# Patient Record
Sex: Female | Born: 1940 | Race: White | Hispanic: No | Marital: Married | Smoking: Former smoker
Health system: Southern US, Community
[De-identification: ages and names within clinical notes are randomized; demographics above are authoritative.]

## PROBLEM LIST (undated history)

## (undated) DIAGNOSIS — Z8739 Personal history of other diseases of the musculoskeletal system and connective tissue: Secondary | ICD-10-CM

## (undated) DIAGNOSIS — M1990 Unspecified osteoarthritis, unspecified site: Secondary | ICD-10-CM

## (undated) DIAGNOSIS — C801 Malignant (primary) neoplasm, unspecified: Secondary | ICD-10-CM

## (undated) DIAGNOSIS — E785 Hyperlipidemia, unspecified: Secondary | ICD-10-CM

## (undated) DIAGNOSIS — C539 Malignant neoplasm of cervix uteri, unspecified: Secondary | ICD-10-CM

## (undated) DIAGNOSIS — Z789 Other specified health status: Secondary | ICD-10-CM

## (undated) DIAGNOSIS — Z853 Personal history of malignant neoplasm of breast: Secondary | ICD-10-CM

## (undated) DIAGNOSIS — I499 Cardiac arrhythmia, unspecified: Secondary | ICD-10-CM

## (undated) DIAGNOSIS — I4891 Unspecified atrial fibrillation: Secondary | ICD-10-CM

## (undated) DIAGNOSIS — R339 Retention of urine, unspecified: Secondary | ICD-10-CM

## (undated) DIAGNOSIS — K529 Noninfective gastroenteritis and colitis, unspecified: Secondary | ICD-10-CM

## (undated) HISTORY — PX: MASTECTOMY WITH AXILLARY LYMPH NODE DISSECTION: SHX5661

## (undated) HISTORY — PX: EYE SURGERY: SHX253

## (undated) HISTORY — PX: BREAST SURGERY: SHX581

## (undated) HISTORY — PX: CATARACT EXTRACTION W/ INTRAOCULAR LENS  IMPLANT, BILATERAL: SHX1307

## (undated) HISTORY — PX: TUBAL LIGATION: SHX77

## (undated) HISTORY — PX: KNEE ARTHROSCOPY: SHX127

## (undated) HISTORY — DX: Personal history of malignant neoplasm of breast: Z85.3

---

## 2004-07-23 DIAGNOSIS — Z853 Personal history of malignant neoplasm of breast: Secondary | ICD-10-CM

## 2004-07-23 HISTORY — PX: MASTECTOMY: SHX3

## 2004-07-23 HISTORY — DX: Personal history of malignant neoplasm of breast: Z85.3

## 2006-08-08 ENCOUNTER — Ambulatory Visit (HOSPITAL_COMMUNITY): Admission: RE | Admit: 2006-08-08 | Discharge: 2006-08-08 | Payer: Self-pay | Admitting: Hematology and Oncology

## 2007-08-11 ENCOUNTER — Ambulatory Visit (HOSPITAL_COMMUNITY): Admission: RE | Admit: 2007-08-11 | Discharge: 2007-08-11 | Payer: Self-pay | Admitting: Hematology and Oncology

## 2007-10-28 ENCOUNTER — Ambulatory Visit: Payer: Self-pay | Admitting: Cardiology

## 2011-08-29 DIAGNOSIS — Z1231 Encounter for screening mammogram for malignant neoplasm of breast: Secondary | ICD-10-CM | POA: Diagnosis not present

## 2011-08-29 DIAGNOSIS — M1710 Unilateral primary osteoarthritis, unspecified knee: Secondary | ICD-10-CM | POA: Diagnosis not present

## 2011-08-29 DIAGNOSIS — Z09 Encounter for follow-up examination after completed treatment for conditions other than malignant neoplasm: Secondary | ICD-10-CM | POA: Diagnosis not present

## 2011-08-29 DIAGNOSIS — Z79899 Other long term (current) drug therapy: Secondary | ICD-10-CM | POA: Diagnosis not present

## 2011-08-29 DIAGNOSIS — M810 Age-related osteoporosis without current pathological fracture: Secondary | ICD-10-CM | POA: Diagnosis not present

## 2011-08-29 DIAGNOSIS — Z9010 Acquired absence of unspecified breast and nipple: Secondary | ICD-10-CM | POA: Diagnosis not present

## 2011-08-29 DIAGNOSIS — Z853 Personal history of malignant neoplasm of breast: Secondary | ICD-10-CM | POA: Diagnosis not present

## 2011-09-03 DIAGNOSIS — M810 Age-related osteoporosis without current pathological fracture: Secondary | ICD-10-CM | POA: Diagnosis not present

## 2011-09-03 DIAGNOSIS — Z853 Personal history of malignant neoplasm of breast: Secondary | ICD-10-CM | POA: Diagnosis not present

## 2011-09-03 DIAGNOSIS — Z1231 Encounter for screening mammogram for malignant neoplasm of breast: Secondary | ICD-10-CM | POA: Diagnosis not present

## 2011-09-03 DIAGNOSIS — M1710 Unilateral primary osteoarthritis, unspecified knee: Secondary | ICD-10-CM | POA: Diagnosis not present

## 2011-09-03 DIAGNOSIS — Z09 Encounter for follow-up examination after completed treatment for conditions other than malignant neoplasm: Secondary | ICD-10-CM | POA: Diagnosis not present

## 2011-09-03 DIAGNOSIS — Z79899 Other long term (current) drug therapy: Secondary | ICD-10-CM | POA: Diagnosis not present

## 2011-09-12 DIAGNOSIS — M1710 Unilateral primary osteoarthritis, unspecified knee: Secondary | ICD-10-CM | POA: Diagnosis not present

## 2011-09-12 DIAGNOSIS — M810 Age-related osteoporosis without current pathological fracture: Secondary | ICD-10-CM | POA: Diagnosis not present

## 2011-09-12 DIAGNOSIS — Z79899 Other long term (current) drug therapy: Secondary | ICD-10-CM | POA: Diagnosis not present

## 2011-09-12 DIAGNOSIS — Z09 Encounter for follow-up examination after completed treatment for conditions other than malignant neoplasm: Secondary | ICD-10-CM | POA: Diagnosis not present

## 2011-09-12 DIAGNOSIS — Z853 Personal history of malignant neoplasm of breast: Secondary | ICD-10-CM | POA: Diagnosis not present

## 2011-09-12 DIAGNOSIS — Z1231 Encounter for screening mammogram for malignant neoplasm of breast: Secondary | ICD-10-CM | POA: Diagnosis not present

## 2011-09-18 DIAGNOSIS — Z853 Personal history of malignant neoplasm of breast: Secondary | ICD-10-CM | POA: Diagnosis not present

## 2011-09-18 DIAGNOSIS — Z9010 Acquired absence of unspecified breast and nipple: Secondary | ICD-10-CM | POA: Diagnosis not present

## 2011-09-18 DIAGNOSIS — Z1231 Encounter for screening mammogram for malignant neoplasm of breast: Secondary | ICD-10-CM | POA: Diagnosis not present

## 2011-09-18 DIAGNOSIS — Z09 Encounter for follow-up examination after completed treatment for conditions other than malignant neoplasm: Secondary | ICD-10-CM | POA: Diagnosis not present

## 2011-09-18 DIAGNOSIS — M810 Age-related osteoporosis without current pathological fracture: Secondary | ICD-10-CM | POA: Diagnosis not present

## 2011-09-18 DIAGNOSIS — Z79899 Other long term (current) drug therapy: Secondary | ICD-10-CM | POA: Diagnosis not present

## 2011-09-18 DIAGNOSIS — M1710 Unilateral primary osteoarthritis, unspecified knee: Secondary | ICD-10-CM | POA: Diagnosis not present

## 2011-10-23 DIAGNOSIS — Z853 Personal history of malignant neoplasm of breast: Secondary | ICD-10-CM | POA: Diagnosis not present

## 2011-10-23 DIAGNOSIS — Z803 Family history of malignant neoplasm of breast: Secondary | ICD-10-CM | POA: Diagnosis not present

## 2011-10-23 DIAGNOSIS — Z79899 Other long term (current) drug therapy: Secondary | ICD-10-CM | POA: Diagnosis not present

## 2011-10-23 DIAGNOSIS — Z09 Encounter for follow-up examination after completed treatment for conditions other than malignant neoplasm: Secondary | ICD-10-CM | POA: Diagnosis not present

## 2011-10-23 DIAGNOSIS — Z9010 Acquired absence of unspecified breast and nipple: Secondary | ICD-10-CM | POA: Diagnosis not present

## 2011-10-23 DIAGNOSIS — M1710 Unilateral primary osteoarthritis, unspecified knee: Secondary | ICD-10-CM | POA: Diagnosis not present

## 2011-10-23 DIAGNOSIS — M810 Age-related osteoporosis without current pathological fracture: Secondary | ICD-10-CM | POA: Diagnosis not present

## 2011-10-23 DIAGNOSIS — Z7983 Long term (current) use of bisphosphonates: Secondary | ICD-10-CM | POA: Diagnosis not present

## 2012-03-31 ENCOUNTER — Encounter: Payer: PRIVATE HEALTH INSURANCE | Admitting: Hematology and Oncology

## 2012-03-31 DIAGNOSIS — M899 Disorder of bone, unspecified: Secondary | ICD-10-CM | POA: Diagnosis not present

## 2012-03-31 DIAGNOSIS — C50919 Malignant neoplasm of unspecified site of unspecified female breast: Secondary | ICD-10-CM | POA: Diagnosis not present

## 2012-03-31 DIAGNOSIS — M949 Disorder of cartilage, unspecified: Secondary | ICD-10-CM | POA: Diagnosis not present

## 2012-03-31 DIAGNOSIS — M1710 Unilateral primary osteoarthritis, unspecified knee: Secondary | ICD-10-CM | POA: Diagnosis not present

## 2012-03-31 DIAGNOSIS — M810 Age-related osteoporosis without current pathological fracture: Secondary | ICD-10-CM | POA: Diagnosis not present

## 2012-04-23 DIAGNOSIS — M1710 Unilateral primary osteoarthritis, unspecified knee: Secondary | ICD-10-CM | POA: Diagnosis not present

## 2012-04-23 DIAGNOSIS — M949 Disorder of cartilage, unspecified: Secondary | ICD-10-CM | POA: Diagnosis not present

## 2012-04-23 DIAGNOSIS — C50919 Malignant neoplasm of unspecified site of unspecified female breast: Secondary | ICD-10-CM | POA: Diagnosis not present

## 2012-11-11 DIAGNOSIS — M76899 Other specified enthesopathies of unspecified lower limb, excluding foot: Secondary | ICD-10-CM | POA: Diagnosis not present

## 2012-11-11 DIAGNOSIS — I10 Essential (primary) hypertension: Secondary | ICD-10-CM | POA: Diagnosis not present

## 2012-11-11 DIAGNOSIS — M818 Other osteoporosis without current pathological fracture: Secondary | ICD-10-CM | POA: Diagnosis not present

## 2012-11-17 DIAGNOSIS — R922 Inconclusive mammogram: Secondary | ICD-10-CM | POA: Diagnosis not present

## 2012-11-17 DIAGNOSIS — Z1231 Encounter for screening mammogram for malignant neoplasm of breast: Secondary | ICD-10-CM | POA: Diagnosis not present

## 2012-11-17 DIAGNOSIS — M810 Age-related osteoporosis without current pathological fracture: Secondary | ICD-10-CM | POA: Diagnosis not present

## 2012-11-25 ENCOUNTER — Other Ambulatory Visit: Payer: Self-pay | Admitting: Internal Medicine

## 2012-11-25 DIAGNOSIS — N6310 Unspecified lump in the right breast, unspecified quadrant: Secondary | ICD-10-CM

## 2012-11-25 DIAGNOSIS — R928 Other abnormal and inconclusive findings on diagnostic imaging of breast: Secondary | ICD-10-CM | POA: Diagnosis not present

## 2012-11-25 DIAGNOSIS — N63 Unspecified lump in unspecified breast: Secondary | ICD-10-CM | POA: Diagnosis not present

## 2012-11-27 ENCOUNTER — Ambulatory Visit
Admission: RE | Admit: 2012-11-27 | Discharge: 2012-11-27 | Disposition: A | Discharge status: Home / Self Care | Payer: PRIVATE HEALTH INSURANCE | Source: Ambulatory Visit | Attending: Internal Medicine | Admitting: Internal Medicine

## 2012-11-27 DIAGNOSIS — N63 Unspecified lump in unspecified breast: Secondary | ICD-10-CM | POA: Diagnosis not present

## 2012-11-27 DIAGNOSIS — N6310 Unspecified lump in the right breast, unspecified quadrant: Secondary | ICD-10-CM

## 2012-11-27 DIAGNOSIS — N6019 Diffuse cystic mastopathy of unspecified breast: Secondary | ICD-10-CM | POA: Diagnosis not present

## 2013-02-10 DIAGNOSIS — M76899 Other specified enthesopathies of unspecified lower limb, excluding foot: Secondary | ICD-10-CM | POA: Diagnosis not present

## 2013-05-14 DIAGNOSIS — J309 Allergic rhinitis, unspecified: Secondary | ICD-10-CM | POA: Diagnosis not present

## 2013-05-14 DIAGNOSIS — IMO0002 Reserved for concepts with insufficient information to code with codable children: Secondary | ICD-10-CM | POA: Diagnosis not present

## 2013-06-29 DIAGNOSIS — M1710 Unilateral primary osteoarthritis, unspecified knee: Secondary | ICD-10-CM | POA: Diagnosis not present

## 2013-07-06 DIAGNOSIS — M1710 Unilateral primary osteoarthritis, unspecified knee: Secondary | ICD-10-CM | POA: Diagnosis not present

## 2013-07-13 DIAGNOSIS — M1710 Unilateral primary osteoarthritis, unspecified knee: Secondary | ICD-10-CM | POA: Diagnosis not present

## 2013-08-20 DIAGNOSIS — M25519 Pain in unspecified shoulder: Secondary | ICD-10-CM | POA: Diagnosis not present

## 2013-08-20 DIAGNOSIS — M19019 Primary osteoarthritis, unspecified shoulder: Secondary | ICD-10-CM | POA: Diagnosis not present

## 2013-08-20 DIAGNOSIS — IMO0002 Reserved for concepts with insufficient information to code with codable children: Secondary | ICD-10-CM | POA: Diagnosis not present

## 2013-11-10 DIAGNOSIS — S86819A Strain of other muscle(s) and tendon(s) at lower leg level, unspecified leg, initial encounter: Secondary | ICD-10-CM | POA: Diagnosis not present

## 2013-11-10 DIAGNOSIS — IMO0002 Reserved for concepts with insufficient information to code with codable children: Secondary | ICD-10-CM | POA: Diagnosis not present

## 2013-11-10 DIAGNOSIS — S838X9A Sprain of other specified parts of unspecified knee, initial encounter: Secondary | ICD-10-CM | POA: Diagnosis not present

## 2013-11-10 DIAGNOSIS — I10 Essential (primary) hypertension: Secondary | ICD-10-CM | POA: Diagnosis not present

## 2013-11-13 DIAGNOSIS — IMO0002 Reserved for concepts with insufficient information to code with codable children: Secondary | ICD-10-CM | POA: Diagnosis not present

## 2013-11-13 DIAGNOSIS — M1710 Unilateral primary osteoarthritis, unspecified knee: Secondary | ICD-10-CM | POA: Diagnosis not present

## 2014-01-13 ENCOUNTER — Other Ambulatory Visit: Payer: Self-pay | Admitting: Internal Medicine

## 2014-01-13 DIAGNOSIS — Z9012 Acquired absence of left breast and nipple: Secondary | ICD-10-CM

## 2014-01-13 DIAGNOSIS — Z853 Personal history of malignant neoplasm of breast: Secondary | ICD-10-CM

## 2014-01-13 DIAGNOSIS — R928 Other abnormal and inconclusive findings on diagnostic imaging of breast: Secondary | ICD-10-CM | POA: Diagnosis not present

## 2014-01-21 ENCOUNTER — Ambulatory Visit
Admission: RE | Admit: 2014-01-21 | Discharge: 2014-01-21 | Disposition: A | Discharge status: Home / Self Care | Payer: Medicare Other | Source: Ambulatory Visit | Attending: Internal Medicine | Admitting: Internal Medicine

## 2014-01-21 ENCOUNTER — Ambulatory Visit
Admission: RE | Admit: 2014-01-21 | Discharge: 2014-01-21 | Disposition: A | Discharge status: Home / Self Care | Payer: Medicare Other | Source: Ambulatory Visit

## 2014-01-21 ENCOUNTER — Encounter (INDEPENDENT_AMBULATORY_CARE_PROVIDER_SITE_OTHER): Payer: Self-pay

## 2014-01-21 DIAGNOSIS — Z1231 Encounter for screening mammogram for malignant neoplasm of breast: Secondary | ICD-10-CM

## 2014-01-21 DIAGNOSIS — Z853 Personal history of malignant neoplasm of breast: Secondary | ICD-10-CM

## 2014-01-21 DIAGNOSIS — Z9012 Acquired absence of left breast and nipple: Secondary | ICD-10-CM

## 2014-01-27 ENCOUNTER — Other Ambulatory Visit: Payer: Self-pay | Admitting: Internal Medicine

## 2014-01-27 DIAGNOSIS — Z853 Personal history of malignant neoplasm of breast: Secondary | ICD-10-CM

## 2014-01-27 DIAGNOSIS — Z9012 Acquired absence of left breast and nipple: Secondary | ICD-10-CM

## 2014-01-27 DIAGNOSIS — Z1231 Encounter for screening mammogram for malignant neoplasm of breast: Secondary | ICD-10-CM

## 2014-01-28 ENCOUNTER — Other Ambulatory Visit: Payer: Self-pay | Admitting: Internal Medicine

## 2014-01-28 DIAGNOSIS — R928 Other abnormal and inconclusive findings on diagnostic imaging of breast: Secondary | ICD-10-CM

## 2014-02-05 ENCOUNTER — Ambulatory Visit
Admission: RE | Admit: 2014-02-05 | Discharge: 2014-02-05 | Disposition: A | Discharge status: Home / Self Care | Payer: Medicare Other | Source: Ambulatory Visit | Attending: Internal Medicine | Admitting: Internal Medicine

## 2014-02-05 DIAGNOSIS — R928 Other abnormal and inconclusive findings on diagnostic imaging of breast: Secondary | ICD-10-CM

## 2014-02-05 DIAGNOSIS — N6009 Solitary cyst of unspecified breast: Secondary | ICD-10-CM | POA: Diagnosis not present

## 2014-02-12 DIAGNOSIS — M20099 Other deformity of finger(s), unspecified finger(s): Secondary | ICD-10-CM | POA: Diagnosis not present

## 2014-02-12 DIAGNOSIS — Z0000 Encounter for general adult medical examination without abnormal findings: Secondary | ICD-10-CM | POA: Diagnosis not present

## 2014-04-03 ENCOUNTER — Encounter (HOSPITAL_COMMUNITY): Payer: Self-pay | Admitting: Emergency Medicine

## 2014-04-03 ENCOUNTER — Emergency Department (HOSPITAL_COMMUNITY)
Admission: EM | Admit: 2014-04-03 | Discharge: 2014-04-03 | Disposition: A | Discharge status: Home / Self Care | Payer: Medicare Other | Attending: Emergency Medicine | Admitting: Emergency Medicine

## 2014-04-03 DIAGNOSIS — T148XXA Other injury of unspecified body region, initial encounter: Secondary | ICD-10-CM

## 2014-04-03 DIAGNOSIS — Y9389 Activity, other specified: Secondary | ICD-10-CM | POA: Insufficient documentation

## 2014-04-03 DIAGNOSIS — W100XXA Fall (on)(from) escalator, initial encounter: Secondary | ICD-10-CM | POA: Diagnosis not present

## 2014-04-03 DIAGNOSIS — Z8739 Personal history of other diseases of the musculoskeletal system and connective tissue: Secondary | ICD-10-CM | POA: Diagnosis not present

## 2014-04-03 DIAGNOSIS — Z23 Encounter for immunization: Secondary | ICD-10-CM | POA: Insufficient documentation

## 2014-04-03 DIAGNOSIS — IMO0002 Reserved for concepts with insufficient information to code with codable children: Secondary | ICD-10-CM | POA: Diagnosis not present

## 2014-04-03 DIAGNOSIS — Y9229 Other specified public building as the place of occurrence of the external cause: Secondary | ICD-10-CM | POA: Insufficient documentation

## 2014-04-03 HISTORY — DX: Unspecified osteoarthritis, unspecified site: M19.90

## 2014-04-03 MED ORDER — SULFAMETHOXAZOLE-TRIMETHOPRIM 800-160 MG PO TABS: 1.0000 | ORAL_TABLET | Freq: Two times a day (BID) | ORAL | Status: DC

## 2014-04-03 MED ORDER — ONDANSETRON 4 MG PO TBDP
8.0000 mg | ORAL_TABLET | Freq: Once | ORAL | Status: AC
  Administered 2014-04-03: 8 mg via ORAL
  Filled 2014-04-03: qty 2

## 2014-04-03 MED ORDER — CEPHALEXIN 500 MG PO CAPS: ORAL_CAPSULE | ORAL | Status: DC

## 2014-04-03 MED ORDER — HYDROCODONE-ACETAMINOPHEN 5-325 MG PO TABS
2.0000 | ORAL_TABLET | Freq: Once | ORAL | Status: AC
  Administered 2014-04-03: 2 via ORAL
  Filled 2014-04-03: qty 2

## 2014-04-03 MED ORDER — TETANUS-DIPHTH-ACELL PERTUSSIS 5-2.5-18.5 LF-MCG/0.5 IM SUSP
0.5000 mL | Freq: Once | INTRAMUSCULAR | Status: AC
  Administered 2014-04-03: 0.5 mL via INTRAMUSCULAR
  Filled 2014-04-03: qty 0.5

## 2014-04-03 MED ORDER — HYDROCODONE-ACETAMINOPHEN 5-325 MG PO TABS: 1.0000 | ORAL_TABLET | Freq: Four times a day (QID) | ORAL | Status: DC | PRN

## 2014-04-03 NOTE — ED Notes (Signed)
[place] mall and fell.  Pt has bil lower leg abrasions

## 2014-04-03 NOTE — ED Provider Notes (Signed)
CSN: [id number]     Arrival date & time 04/03/14  1434 History  This chart was scribed for non-physician practitioner, Cleatrice Burke, PA-C working with Debby Freiberg, MD, by Erling Conte, ED Scribe. This patient was seen in room TR09C/TR09C and the patient's care was started at 4:49 PM.    Chief Complaint  Patient presents with  . Abrasion  . Extremity Laceration     The history is provided by the patient. No language interpreter was used.   HPI Comments: Pam [name] is a 73 y.o. female who presents to the Emergency Department complaining of a laceration to both lower legs. She is having associated "burning", "9/10", lower leg pain. Pt states she was at the mall and missed a step and her leg got cut on the escalator. There is some active bleeding but the bleeding is currently being controlled by wrapped gauze bandages. She states she is due to have a t-dap. She denies any other injuries from the fall. She denies any h/o DM. She is not currently taking any anticoagulants. She denies any LOC, weakness, numbness or swelling of the lower legs, or HA.   Past Medical History  Diagnosis Date  . Arthritis    No past surgical history on file. No family history on file. History  Substance Use Topics  . Smoking status: Never Smoker   . Smokeless tobacco: Not on file  . Alcohol Use: No   OB History   Grav Para Term Preterm Abortions TAB SAB Ect Mult Living                 Review of Systems  Musculoskeletal: Negative for arthralgias, joint swelling and myalgias.  Skin: Positive for wound.  Neurological: Negative for syncope, weakness, numbness and headaches.  Hematological: Does not bruise/bleed easily.      Allergies  Review of patient's allergies indicates no known allergies.  Home Medications   Prior to Admission medications   Not on File   Triage Vitals: BP 131/96  Pulse 106  Temp(Src) 97.8 F (36.6 C) (Oral)  Resp 20  Ht 5\' 6"  (1.676 m)  Wt 123 lb (55.792 kg)   BMI 19.86 kg/m2  SpO2 96%  Physical Exam  Nursing note and vitals reviewed. Constitutional: She is oriented to person, place, and time. She appears well-developed and well-nourished. No distress.  HENT:  Head: Normocephalic and atraumatic.  Right Ear: External ear normal.  Left Ear: External ear normal.  Nose: Nose normal.  Mouth/Throat: Oropharynx is clear and moist.  Eyes: Conjunctivae are normal.  Neck: Normal range of motion.  Cardiovascular: Normal rate, regular rhythm and normal heart sounds.   Pulmonary/Chest: Effort normal and breath sounds normal. No stridor. No respiratory distress. She has no wheezes. She has no rales.  Abdominal: Soft. She exhibits no distension.  Musculoskeletal: Normal range of motion.  Neurological: She is alert and oriented to person, place, and time. She has normal strength.  Skin: Skin is warm and dry. She is not diaphoretic. No erythema.  Abrasions to posterior aspect of calves bilaterally. Strength 5/5 in lower extremities bilaterally. Sensation intact  Psychiatric: She has a normal mood and affect. Her behavior is normal.    ED Course  Procedures (including critical care time)  DIAGNOSTIC STUDIES: Oxygen Saturation is 96% on RA, adequate by my interpretation.    COORDINATION OF CARE: 4:55 PM- Will order Zofran, Norco and t-dap vaccination. Pt on my examination pt does need a laceration repair. Pt advised of plan for  treatment and pt agrees    Labs Review Labs Reviewed - No data to display  Imaging Review No results found.   EKG Interpretation None      MDM   Final diagnoses:  Abrasion   Patient presents to ED for evaluation of bilateral abrasions to posterior calves. Abrasions or significant, but superficial. No sutures required at this. Strength 5/5 with dorsi/plantar flexion. Sensation intact. Patient's TDAP was updated and wound was cleaned. Patient very concerned about infection. She was given antibiotics, but told to only  use them at signs of infection. She will f/u with PCP. Discussed reasons to return to ED immediately. Vital signs stable for discharge. Dr. Colin Rhein evaluated patient and agrees with plan. Patient / Family / Caregiver informed of clinical course, understand medical decision-making process, and agree with plan.   I personally performed the services described in this documentation, which was scribed in my presence. The recorded information has been reviewed and is accurate.    Elwyn Lade, PA-C 04/04/14 2357

## 2014-04-03 NOTE — Discharge Instructions (Signed)

## 2014-04-03 NOTE — ED Notes (Signed)
Pt. Stated, I was at mall and missed a step and my leg got all scrape and cut on the escalator steps.  Both lower legs.

## 2014-04-13 NOTE — ED Provider Notes (Signed)
Medical screening examination/treatment/procedure(s) were performed by non-physician practitioner and as supervising physician I was immediately available for consultation/collaboration.   EKG Interpretation None        Debby Freiberg, MD 04/13/14 [phone]

## 2014-05-18 DIAGNOSIS — M179 Osteoarthritis of knee, unspecified: Secondary | ICD-10-CM | POA: Diagnosis not present

## 2014-08-20 DIAGNOSIS — M179 Osteoarthritis of knee, unspecified: Secondary | ICD-10-CM | POA: Diagnosis not present

## 2014-08-20 DIAGNOSIS — M150 Primary generalized (osteo)arthritis: Secondary | ICD-10-CM | POA: Diagnosis not present

## 2014-08-20 DIAGNOSIS — J309 Allergic rhinitis, unspecified: Secondary | ICD-10-CM | POA: Diagnosis not present

## 2014-11-05 DIAGNOSIS — M179 Osteoarthritis of knee, unspecified: Secondary | ICD-10-CM | POA: Diagnosis not present

## 2014-11-05 DIAGNOSIS — M150 Primary generalized (osteo)arthritis: Secondary | ICD-10-CM | POA: Diagnosis not present

## 2014-11-05 DIAGNOSIS — H8113 Benign paroxysmal vertigo, bilateral: Secondary | ICD-10-CM | POA: Diagnosis not present

## 2015-01-06 ENCOUNTER — Other Ambulatory Visit: Payer: Self-pay

## 2015-01-06 DIAGNOSIS — Z1231 Encounter for screening mammogram for malignant neoplasm of breast: Secondary | ICD-10-CM

## 2015-01-06 DIAGNOSIS — Z9012 Acquired absence of left breast and nipple: Secondary | ICD-10-CM

## 2015-02-04 DIAGNOSIS — Z1231 Encounter for screening mammogram for malignant neoplasm of breast: Secondary | ICD-10-CM | POA: Diagnosis not present

## 2015-02-11 DIAGNOSIS — M179 Osteoarthritis of knee, unspecified: Secondary | ICD-10-CM | POA: Diagnosis not present

## 2015-03-14 DIAGNOSIS — C541 Malignant neoplasm of endometrium: Secondary | ICD-10-CM | POA: Diagnosis not present

## 2015-03-14 DIAGNOSIS — N950 Postmenopausal bleeding: Secondary | ICD-10-CM | POA: Diagnosis not present

## 2015-03-14 DIAGNOSIS — R87613 High grade squamous intraepithelial lesion on cytologic smear of cervix (HGSIL): Secondary | ICD-10-CM | POA: Diagnosis not present

## 2015-03-14 DIAGNOSIS — R1032 Left lower quadrant pain: Secondary | ICD-10-CM | POA: Diagnosis not present

## 2015-03-14 DIAGNOSIS — R319 Hematuria, unspecified: Secondary | ICD-10-CM | POA: Diagnosis not present

## 2015-03-23 ENCOUNTER — Encounter: Payer: Self-pay | Admitting: Gynecologic Oncology

## 2015-03-23 ENCOUNTER — Ambulatory Visit: Payer: Medicare Other | Attending: Gynecologic Oncology | Admitting: Gynecologic Oncology

## 2015-03-23 VITALS — BP 157/61 | HR 81 | Temp 97.7°F | Resp 16 | Ht 63.5 in | Wt 125.9 lb

## 2015-03-23 DIAGNOSIS — C539 Malignant neoplasm of cervix uteri, unspecified: Secondary | ICD-10-CM | POA: Insufficient documentation

## 2015-03-23 DIAGNOSIS — IMO0002 Reserved for concepts with insufficient information to code with codable children: Secondary | ICD-10-CM

## 2015-03-23 NOTE — Progress Notes (Signed)
Consult Note: Gyn-Onc  Pam [name] 74 y.o. female  CC:  Chief Complaint  Patient presents with  . Squamous Cell Carcinoma    New consult    HPI: Patient is seen today in consultation at the request of Dr. Benjie Karvonen for newly diagnosed cervical cancer.  Patient is a very pleasant 74 year old gravida 1 para 1 who became menopausal in her late 70s. She was never on any hormone replacement therapy. Her last GYN exam was approximately 11 years ago. She had an episode of vaginal spotting about 3 weeks ago. It was associated with One-A-Day pain the day prior. She went to see Dr. Benjie Karvonen who is the same physician her daughter sees. She had a Pap smear performed at that time. 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 Her Pap smear revealed high-grade squamous intraepithelial lesion with severe dysplasia cannot exclude microinvasive carcinoma. She underwent an ultrasound revealed the endometrial cavity to have some fluid within it. The endometrial walls were noted to be 5.4 mm. There is 1.7 x 0.7 x 1.8 cm area that the appearance of a polyp that there is no vascularity seen. There was a 2.4 x 2.1 x 2.3 cm lower uterine segment fibroid. They could not identify any abnormalities in the adnexa. Because of the endometrial biopsy results and the appearance of the cervix there was a concern for a squamous cell carcinoma the cervix and she was subsequently referred to [place].  The patient is married though she is not sexually active. As stated her last Pap smear was in 2006 she's never had an abnormal Pap smear. She has lost about 70 pounds in the last 3-4 years. She works every day. Other than some arthritis in her left knee she overall feels very well. She is up-to-date on her mammograms having had one last in July 2016. She had a breast biopsy done in May 2014 that was negative. She has a  personal history of breast cancer treated in 2006 with surgical excision followed by Adriamycin and Cytoxan based chemotherapy. She is up-to-date on her colonoscopy.  Review of Systems  Constitutional: Denies fever. Skin: No rash Cardiovascular: No chest pain, shortness of breath, or edema  Pulmonary: No cough  Gastro Intestinal: Reporting intermittent lower abdominal soreness.  No nausea, vomiting, constipation, or diarrhea reported. No change in bowel movement.  Genitourinary: Denies vaginal bleeding and discharge other than above.  Musculoskeletal: + left knee arthritis pain Neurologic: No weakness, numbness, or change in gait.  Psychology: "not worried:   Current Meds:  Outpatient Encounter Prescriptions as of 03/23/2015  Medication Sig  . Calcium Carbonate (CALCIUM 600 PO) Take 1 tablet by mouth daily.  . Cholecalciferol (VITAMIN D PO) Take 1 capsule by mouth daily.  . diclofenac (VOLTAREN) 75 MG EC tablet Take 75 mg by mouth daily.  . fexofenadine (ALLEGRA) 180 MG tablet Take 180 mg by mouth daily.  . [DISCONTINUED] cephALEXin (KEFLEX) 500 MG capsule 2 caps po bid x 7 days  . [DISCONTINUED] HYDROcodone-acetaminophen (NORCO/VICODIN) 5-325 MG per tablet Take 1 tablet by mouth every 6 (six) hours as needed for severe pain.  . [DISCONTINUED] sulfamethoxazole-trimethoprim (SEPTRA DS) 800-160 MG per tablet Take 1 tablet by mouth every 12 (twelve) hours.   No facility-administered encounter medications on file as of 03/23/2015.    Allergy:  Allergies  Allergen Reactions  . Aspirin Nausea And Vomiting  . Ciprofloxacin   .  Flagyl [Metronidazole]   . Penicillins Hives    Social Hx:   Social History   Social History  . Marital Status: Married    Spouse Name: N/A  . Number of Children: N/A  . Years of Education: N/A   Occupational History  . Not on file.   Social History Main Topics  . Smoking status: Former Smoker -- 30 years    Types: Cigarettes    Quit date: 07/23/1994   . Smokeless tobacco: Not on file  . Alcohol Use: No  . Drug Use: No  . Sexual Activity: Not on file   Other Topics Concern  . Not on file   Social History Narrative    Past Surgical Hx:  Past Surgical History  Procedure Laterality Date  . Mastectomy Left 2006  . Knee arthroscopy Left     Past Medical Hx:  Past Medical History  Diagnosis Date  . Arthritis   . History of breast cancer 2006    Dr. Sonny Dandy oncologist     Oncology Hx:   No history exists.    Family Hx:  Family History  Problem Relation Age of Onset  . Prostate cancer Father   . Heart disease Father   . Breast cancer Sister   . Heart disease Mother     Vitals:  Blood pressure 157/61, pulse 81, temperature 97.7 F (36.5 C), temperature source Oral, resp. rate 16, height 5' 3.5" (1.613 m), weight 125 lb 14.4 oz (57.108 kg).  Physical Exam: Well-nourished well-developed female in no acute distress  Neck: Supple, no lymphadenopathy, no thyromegaly.  Lungs: Clear to auscultation bilaterally.  Cardiac: Regular rate and rhythm.  Abdomen: Soft, nontender, nondistended. No palpable masses or hepatosplenomegaly. No rebound or guarding.  Groins: No lymphadenopathy.  Extremity: No edema.  Pelvic: Normal female genitalia. Vagina is markedly atrophic. The cervix is irregular in appearance with an ulcerated area at the cervical os measured approximately 1 cm. Bimanual exam reveals the cervix to be firm and slightly enlarged. The uterus is of normal size shape and consistency. There are no adnexal masses. Rectovaginal examination reveals no parametrial involvement. The cervix and uterus are freely mobile. Total size of the cervix is 3-4 cm.  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 While cervical biopsy was not performed, the endometrial biopsy revealed fragments of squamous cell carcinoma and clinically this is consistent with stage 1B disease. I spoke with  the patient and her daughter at length. We will proceed with a PET CT which is scheduled for September 7. We will notify her of those results. Pending that her PET/CT does not reveal any extra cervical disease, the patient would very much like to proceed with surgery and she scheduled for a robotic radical hysterectomy, bilateral salpingo-oophorectomy, bilateral pelvic lymph node dissection with Dr. Everitt Amber on September 15. I explained the procedure to the patient and her daughter. She understands that typically patients are in the hospital just over night. She is aware that she will be discharged home with a Foley catheter in her bladder and will return to clinic for voiding trial. She was told that she would be out of work for 4-6 weeks.  We discussed the option that if there is evidence of metastatic disease on her PET scan that she'll be treated with primary chemoradiation.  Her questions as well as those of her daughter were elicited in answer to their satisfaction. They're very pleased it appears to be a  clinical stage I cancer and they are hopeful that surgery alone will be all that is necessary for her treatment.  They will review the printed information was provided to them today. They know that they can call us should they have any questions prior to surgery.  Ponce Skillman A., MD 03/23/2015, 2:17 PM

## 2015-03-23 NOTE — Patient Instructions (Signed)
Preparing for your Surgery  Plan for surgery on September 15 with Dr. Everitt Amber for a Robotic Assisted Radical Hysterectomy.  Pre-operative Testing -You will receive a phone call from presurgical testing at [facility] to arrange for a pre-operative testing appointment before your surgery.  This appointment normally occurs one to two weeks before your scheduled surgery.   -Bring your insurance card, copy of an advanced directive if applicable, medication list  -At that visit, you will be asked to sign a consent for a possible blood transfusion in case a transfusion becomes necessary during surgery.  The need for a blood transfusion is rare but having consent is a necessary part of your care.     -You should not be taking blood thinners or aspirin at least ten days prior to surgery unless instructed by your surgeon.  Day Before Surgery at [place] will be asked to take in only clear liquids the day before surgery.  Examples of clear liquids include broths, jello, and clear juices.  Avoid carbonated beverages.  You will be advised to have nothing to eat or drink after midnight the evening before.    Your role in recovery Your role is to become active as soon as directed by your doctor, while still giving yourself time to heal.  Rest when you feel tired. You will be asked to do the following in order to speed your recovery:  - Cough and breathe deeply. This helps toclear and expand your lungs and can prevent pneumonia. You may be given a spirometer to practice deep breathing. A staff member will show you how to use the spirometer. - Do mild physical activity. Walking or moving your legs help your circulation and body functions return to normal. A staff member will help you when you try to walk and will provide you with simple exercises. Do not try to get up or walk alone the first time. - Actively manage your pain. Managing your pain lets you move in comfort. We will ask  you to rate your pain on a scale of zero to 10. It is your responsibility to tell your doctor or nurse where and how much you hurt so your pain can be treated.  Special Considerations -If you are diabetic, you may be placed on insulin after surgery to have closer control over your blood sugars to promote healing and recovery.  This does not mean that you will be discharged on insulin.  If applicable, your oral antidiabetics will be resumed when you are tolerating a solid diet.  -Your final pathology results from surgery should be available by the Friday after surgery and the results will be relayed to you when available.  Blood Transfusion Information WHAT IS A BLOOD TRANSFUSION? A transfusion is the replacement of blood or some of its parts. Blood is made up of multiple cells which provide different functions.  Red blood cells carry oxygen and are used for blood loss replacement.  White blood cells fight against infection.  Platelets control bleeding.  Plasma helps clot blood.  Other blood products are available for specialized needs, such as hemophilia or other clotting disorders. BEFORE THE TRANSFUSION  Who gives blood for transfusions?   You may be able to donate blood to be used at a later date on yourself (autologous donation).  Relatives can be asked to donate blood. This is generally not any safer than if you have received blood from a stranger. The same precautions are taken to ensure safety when  a relative's blood is donated.  Healthy volunteers who are fully evaluated to make sure their blood is safe. This is blood bank blood. Transfusion therapy is the safest it has ever been in the practice of medicine. Before blood is taken from a donor, a complete history is taken to make sure that person has no history of diseases nor engages in risky social behavior (examples are intravenous drug use or sexual activity with multiple partners). The donor's travel history is screened to  minimize risk of transmitting infections, such as malaria. The donated blood is tested for signs of infectious diseases, such as HIV and hepatitis. The blood is then tested to be sure it is compatible with you in order to minimize the chance of a transfusion reaction. If you or a relative donates blood, this is often done in anticipation of surgery and is not appropriate for emergency situations. It takes many days to process the donated blood. RISKS AND COMPLICATIONS Although transfusion therapy is very safe and saves many lives, the main dangers of transfusion include:   Getting an infectious disease.  Developing a transfusion reaction. This is an allergic reaction to something in the blood you were given. Every precaution is taken to prevent this. The decision to have a blood transfusion has been considered carefully by your caregiver before blood is given. Blood is not given unless the benefits outweigh the risks.

## 2015-03-24 DIAGNOSIS — R339 Retention of urine, unspecified: Secondary | ICD-10-CM

## 2015-03-24 HISTORY — DX: Retention of urine, unspecified: R33.9

## 2015-03-30 ENCOUNTER — Telehealth: Payer: Self-pay | Admitting: Gynecologic Oncology

## 2015-03-30 ENCOUNTER — Encounter (HOSPITAL_COMMUNITY)
Admission: RE | Admit: 2015-03-30 | Discharge: 2015-03-30 | Disposition: A | Discharge status: Home / Self Care | Payer: Medicare Other | Source: Ambulatory Visit | Attending: Gynecologic Oncology | Admitting: Gynecologic Oncology

## 2015-03-30 DIAGNOSIS — C801 Malignant (primary) neoplasm, unspecified: Secondary | ICD-10-CM | POA: Diagnosis not present

## 2015-03-30 DIAGNOSIS — IMO0002 Reserved for concepts with insufficient information to code with codable children: Secondary | ICD-10-CM

## 2015-03-30 DIAGNOSIS — C539 Malignant neoplasm of cervix uteri, unspecified: Secondary | ICD-10-CM | POA: Diagnosis not present

## 2015-03-30 LAB — GLUCOSE, CAPILLARY: Glucose-Capillary: 96 mg/dL (ref 65–99)

## 2015-03-30 MED ORDER — FLUDEOXYGLUCOSE F - 18 (FDG) INJECTION
7.4000 | Freq: Once | INTRAVENOUS | Status: DC | PRN
  Administered 2015-03-30: 7.4 via INTRAVENOUS
  Filled 2015-03-30: qty 7.4

## 2015-03-30 NOTE — Telephone Encounter (Signed)
Patient informed of PET scan results.  No concerns voiced.  We will continue with scheduled surgery.

## 2015-03-31 NOTE — Patient Instructions (Addendum)
YOUR PROCEDURE IS SCHEDULED ON :  04/07/15  REPORT TO [place] [facility] MAIN ENTRANCE FOLLOW SIGNS TO EAST ELEVATOR - GO TO 3rd FLOOR CHECK IN AT 3 EAST NURSES STATION (SHORT STAY) AT:  5:30 AM  CALL THIS NUMBER IF YOU HAVE PROBLEMS THE MORNING OF SURGERY [phone]  REMEMBER:ONLY 1 PER PERSON MAY GO TO SHORT STAY WITH YOU TO GET READY THE MORNING OF YOUR SURGERY  DO NOT EAT FOOD OR DRINK LIQUIDS AFTER MIDNIGHT  TAKE THESE MEDICINES THE MORNING OF SURGERY: NONE  CLEAR LIQUIDS ONLY THE DAY BEFORE SURGERY     CLEAR LIQUID DIET   Foods Allowed                                                                     Foods Excluded  Coffee and tea, regular and decaf                             liquids that you cannot  Plain Jell-O in any flavor                                             see through such as: Fruit ices (not with fruit pulp)                                     milk, soups, orange juice  Iced Popsicles                                                  All solid food Carbonated beverages, regular and diet                                    Cranberry, grape and apple juices Sports drinks like Gatorade Lightly seasoned clear broth or consume(fat free) Sugar, honey syrup   _____________________________________________________________________    YOU MAY NOT HAVE ANY METAL ON YOUR BODY INCLUDING HAIR PINS AND PIERCING'S. DO NOT WEAR JEWELRY, MAKEUP, LOTIONS, POWDERS OR PERFUMES. DO NOT WEAR NAIL POLISH. DO NOT SHAVE 48 HRS PRIOR TO SURGERY. MEN MAY SHAVE FACE AND NECK.  DO NOT [place]. [facility] IS NOT RESPONSIBLE FOR VALUABLES.  CONTACTS, DENTURES OR PARTIALS MAY NOT BE WORN TO SURGERY. LEAVE SUITCASE IN CAR. CAN BE BROUGHT TO ROOM AFTER SURGERY.  PATIENTS DISCHARGED THE DAY OF SURGERY WILL NOT BE ALLOWED TO DRIVE HOME.  PLEASE READ OVER THE FOLLOWING INSTRUCTION  SHEETS _________________________________________________________________________________                                          [facility] - PREPARING FOR SURGERY  Before surgery, you can play an important role.  Because skin is not sterile, your skin needs to be as free of germs as possible.  You can reduce the number of germs on your skin by washing with CHG (chlorahexidine gluconate) soap before surgery.  CHG is an antiseptic cleaner which kills germs and bonds with the skin to continue killing germs even after washing. Please DO NOT use if you have an allergy to CHG or antibacterial soaps.  If your skin becomes reddened/irritated stop using the CHG and inform your nurse when you arrive at Short Stay. Do not shave (including legs and underarms) for at least 48 hours prior to the first CHG shower.  You may shave your face. Please follow these instructions carefully:   1.  Shower with CHG Soap the night before surgery and the  morning of Surgery.   2.  If you choose to wash your hair, wash your hair first as usual with your  normal  Shampoo.   3.  After you shampoo, rinse your hair and body thoroughly to remove the  shampoo.                                         4.  Use CHG as you would any other liquid soap.  You can apply chg directly  to the skin and wash . Gently wash with scrungie or clean wascloth    5.  Apply the CHG Soap to your body ONLY FROM THE NECK DOWN.   Do not use on open                           Wound or open sores. Avoid contact with eyes, ears mouth and genitals (private parts).                        Genitals (private parts) with your normal soap.              6.  Wash thoroughly, paying special attention to the area where your surgery  will be performed.   7.  Thoroughly rinse your body with warm water from the neck down.   8.  DO NOT shower/wash with your normal soap after using and rinsing off  the CHG Soap .                9.  Pat yourself dry with a clean  towel.             10.  Wear clean night clothes to bed after shower             11.  Place clean sheets on your bed the night of your first shower and do not  sleep with pets.  Day of Surgery : Do not apply any lotions/deodorants the morning of surgery.  Please wear clean clothes to the hospital/surgery center.  FAILURE TO FOLLOW THESE INSTRUCTIONS MAY RESULT IN THE CANCELLATION OF YOUR SURGERY    PATIENT SIGNATURE_________________________________  ______________________________________________________________________     Pam [name]  An incentive spirometer is a tool that can help keep your lungs clear and active. This tool measures how well you are filling your lungs with each breath. Taking long deep breaths may help reverse or decrease the chance of developing breathing (pulmonary) problems (especially infection) following:  A long period of time when you are unable to move  or be active. BEFORE THE PROCEDURE   If the spirometer includes an indicator to show your best effort, your nurse or respiratory therapist will set it to a desired goal.  If possible, sit up straight or lean slightly forward. Try not to slouch.  Hold the incentive spirometer in an upright position. INSTRUCTIONS FOR USE   Sit on the edge of your bed if possible, or sit up as far as you can in bed or on a chair.  Hold the incentive spirometer in an upright position.  Breathe out normally.  Place the mouthpiece in your mouth and seal your lips tightly around it.  Breathe in slowly and as deeply as possible, raising the piston or the ball toward the top of the column.  Hold your breath for 3-5 seconds or for as long as possible. Allow the piston or ball to fall to the bottom of the column.  Remove the mouthpiece from your mouth and breathe out normally.  Rest for a few seconds and repeat Steps 1 through 7 at least 10 times every 1-2 hours when you are awake. Take your time and take a few  normal breaths between deep breaths.  The spirometer may include an indicator to show your best effort. Use the indicator as a goal to work toward during each repetition.  After each set of 10 deep breaths, practice coughing to be sure your lungs are clear. If you have an incision (the cut made at the time of surgery), support your incision when coughing by placing a pillow or rolled up towels firmly against it. Once you are able to get out of bed, walk around indoors and cough well. You may stop using the incentive spirometer when instructed by your caregiver.  RISKS AND COMPLICATIONS  Take your time so you do not get dizzy or light-headed.  If you are in pain, you may need to take or ask for pain medication before doing incentive spirometry. It is harder to take a deep breath if you are having pain. AFTER USE  Rest and breathe slowly and easily.  It can be helpful to keep track of a log of your progress. Your caregiver can provide you with a simple table to help with this. If you are using the spirometer at home, follow these instructions: [place] IF:   You are having difficultly using the spirometer.  You have trouble using the spirometer as often as instructed.  Your pain medication is not giving enough relief while using the spirometer.  You develop fever of 100.5 F (38.1 C) or higher. SEEK IMMEDIATE MEDICAL CARE IF:   You cough up bloody sputum that had not been present before.  You develop fever of 102 F (38.9 C) or greater.  You develop worsening pain at or near the incision site. MAKE SURE YOU:   Understand these instructions.  Will watch your condition.  Will get help right away if you are not doing well or get worse. Document Released: 11/19/2006 Document Revised: 10/01/2011 Document Reviewed: 01/20/2007 ExitCare Patient Information 2014 [employer], [place].   ________________________________________________________________________  WHAT IS A BLOOD  TRANSFUSION? Blood Transfusion Information  A transfusion is the replacement of blood or some of its parts. Blood is made up of multiple cells which provide different functions.  Red blood cells carry oxygen and are used for blood loss replacement.  White blood cells fight against infection.  Platelets control bleeding.  Plasma helps clot blood.  Other blood products are available for  specialized needs, such as hemophilia or other clotting disorders. BEFORE THE TRANSFUSION  Who gives blood for transfusions?   Healthy volunteers who are fully evaluated to make sure their blood is safe. This is blood bank blood. Transfusion therapy is the safest it has ever been in the practice of medicine. Before blood is taken from a donor, a complete history is taken to make sure that person has no history of diseases nor engages in risky social behavior (examples are intravenous drug use or sexual activity with multiple partners). The donor's travel history is screened to minimize risk of transmitting infections, such as malaria. The donated blood is tested for signs of infectious diseases, such as HIV and hepatitis. The blood is then tested to be sure it is compatible with you in order to minimize the chance of a transfusion reaction. If you or a relative donates blood, this is often done in anticipation of surgery and is not appropriate for emergency situations. It takes many days to process the donated blood. RISKS AND COMPLICATIONS Although transfusion therapy is very safe and saves many lives, the main dangers of transfusion include:   Getting an infectious disease.  Developing a transfusion reaction. This is an allergic reaction to something in the blood you were given. Every precaution is taken to prevent this. The decision to have a blood transfusion has been considered carefully by your caregiver before blood is given. Blood is not given unless the benefits outweigh the risks. AFTER THE  TRANSFUSION  Right after receiving a blood transfusion, you will usually feel much better and more energetic. This is especially true if your red blood cells have gotten low (anemic). The transfusion raises the level of the red blood cells which carry oxygen, and this usually causes an energy increase.  The nurse administering the transfusion will monitor you carefully for complications. HOME CARE INSTRUCTIONS  No special instructions are needed after a transfusion. You may find your energy is better. Speak with your caregiver about any limitations on activity for underlying diseases you may have. SEEK MEDICAL CARE IF:   Your condition is not improving after your transfusion.  You develop redness or irritation at the intravenous (IV) site. SEEK IMMEDIATE MEDICAL CARE IF:  Any of the following symptoms occur over the next 12 hours:  Shaking chills.  You have a temperature by mouth above 102 F (38.9 C), not controlled by medicine.  Chest, back, or muscle pain.  People around you feel you are not acting correctly or are confused.  Shortness of breath or difficulty breathing.  Dizziness and fainting.  You get a rash or develop hives.  You have a decrease in urine output.  Your urine turns a dark color or changes to pink, red, or brown. Any of the following symptoms occur over the next 10 days:  You have a temperature by mouth above 102 F (38.9 C), not controlled by medicine.  Shortness of breath.  Weakness after normal activity.  The white part of the eye turns yellow (jaundice).  You have a decrease in the amount of urine or are urinating less often.  Your urine turns a dark color or changes to pink, red, or brown. Document Released: 07/06/2000 Document Revised: 10/01/2011 Document Reviewed: 02/23/2008 [facility] Patient Information 2014 [place], [place].  _______________________________________________________________________

## 2015-04-01 ENCOUNTER — Encounter (HOSPITAL_COMMUNITY): Payer: Self-pay

## 2015-04-01 ENCOUNTER — Encounter (HOSPITAL_COMMUNITY)
Admission: RE | Admit: 2015-04-01 | Discharge: 2015-04-01 | Disposition: A | Discharge status: Home / Self Care | Payer: Medicare Other | Source: Ambulatory Visit | Attending: Gynecologic Oncology | Admitting: Gynecologic Oncology

## 2015-04-01 DIAGNOSIS — Z01818 Encounter for other preprocedural examination: Secondary | ICD-10-CM | POA: Diagnosis not present

## 2015-04-01 DIAGNOSIS — C539 Malignant neoplasm of cervix uteri, unspecified: Secondary | ICD-10-CM | POA: Insufficient documentation

## 2015-04-01 HISTORY — DX: Malignant (primary) neoplasm, unspecified: C80.1

## 2015-04-01 HISTORY — DX: Personal history of other diseases of the musculoskeletal system and connective tissue: Z87.39

## 2015-04-01 HISTORY — DX: Hyperlipidemia, unspecified: E78.5

## 2015-04-01 LAB — CBC WITH DIFFERENTIAL/PLATELET
BASOS ABS: 0 10*3/uL (ref 0.0–0.1)
BASOS PCT: 0 % (ref 0–1)
EOS ABS: 0.2 10*3/uL (ref 0.0–0.7)
Eosinophils Relative: 3 % (ref 0–5)
HEMATOCRIT: 40.9 % (ref 36.0–46.0)
Hemoglobin: 13.4 g/dL (ref 12.0–15.0)
Lymphocytes Relative: 18 % (ref 12–46)
Lymphs Abs: 1.6 10*3/uL (ref 0.7–4.0)
MCH: 28.8 pg (ref 26.0–34.0)
MCHC: 32.8 g/dL (ref 30.0–36.0)
MCV: 87.8 fL (ref 78.0–100.0)
MONO ABS: 0.5 10*3/uL (ref 0.1–1.0)
Monocytes Relative: 5 % (ref 3–12)
NEUTROS ABS: 6.6 10*3/uL (ref 1.7–7.7)
NEUTROS PCT: 74 % (ref 43–77)
PLATELETS: 289 10*3/uL (ref 150–400)
RBC: 4.66 MIL/uL (ref 3.87–5.11)
RDW: 13.2 % (ref 11.5–15.5)
WBC: 8.9 10*3/uL (ref 4.0–10.5)

## 2015-04-01 LAB — COMPREHENSIVE METABOLIC PANEL
ALBUMIN: 4.2 g/dL (ref 3.5–5.0)
ALT: 17 U/L (ref 14–54)
ANION GAP: 8 (ref 5–15)
AST: 16 U/L (ref 15–41)
Alkaline Phosphatase: 64 U/L (ref 38–126)
BILIRUBIN TOTAL: 0.8 mg/dL (ref 0.3–1.2)
BUN: 14 mg/dL (ref 6–20)
CHLORIDE: 104 mmol/L (ref 101–111)
CO2: 29 mmol/L (ref 22–32)
Calcium: 9.8 mg/dL (ref 8.9–10.3)
Creatinine, Ser: 0.57 mg/dL (ref 0.44–1.00)
GFR calc Af Amer: >60 mL/min (ref >60)
GFR calc non Af Amer: >60 mL/min (ref >60)
GLUCOSE: 96 mg/dL (ref 65–99)
POTASSIUM: 4 mmol/L (ref 3.5–5.1)
SODIUM: 141 mmol/L (ref 135–145)
Total Protein: 6.6 g/dL (ref 6.5–8.1)

## 2015-04-01 LAB — URINALYSIS, ROUTINE W REFLEX MICROSCOPIC
Bilirubin Urine: NEGATIVE
Glucose, UA: NEGATIVE mg/dL
Hgb urine dipstick: NEGATIVE
KETONES UR: NEGATIVE mg/dL
NITRITE: NEGATIVE
PROTEIN: NEGATIVE mg/dL
Specific Gravity, Urine: 1.014 (ref 1.005–1.030)
UROBILINOGEN UA: 0.2 mg/dL (ref 0.0–1.0)
pH: 6.5 (ref 5.0–8.0)

## 2015-04-01 LAB — URINE MICROSCOPIC-ADD ON

## 2015-04-07 ENCOUNTER — Inpatient Hospital Stay (HOSPITAL_COMMUNITY): Payer: Medicare Other | Admitting: Certified Registered Nurse Anesthetist

## 2015-04-07 ENCOUNTER — Ambulatory Visit (HOSPITAL_COMMUNITY)
Admission: RE | Admit: 2015-04-07 | Discharge: 2015-04-08 | Disposition: A | Discharge status: Home / Self Care | Payer: Medicare Other | Source: Ambulatory Visit | Attending: Gynecologic Oncology | Admitting: Gynecologic Oncology

## 2015-04-07 ENCOUNTER — Encounter (HOSPITAL_COMMUNITY)
Admission: RE | Disposition: A | Discharge status: Home / Self Care | Payer: Self-pay | Source: Ambulatory Visit | Attending: Gynecologic Oncology

## 2015-04-07 ENCOUNTER — Encounter (HOSPITAL_COMMUNITY): Payer: Self-pay | Admitting: *Deleted

## 2015-04-07 DIAGNOSIS — Z87891 Personal history of nicotine dependence: Secondary | ICD-10-CM | POA: Insufficient documentation

## 2015-04-07 DIAGNOSIS — M1990 Unspecified osteoarthritis, unspecified site: Secondary | ICD-10-CM | POA: Insufficient documentation

## 2015-04-07 DIAGNOSIS — Z9010 Acquired absence of unspecified breast and nipple: Secondary | ICD-10-CM | POA: Insufficient documentation

## 2015-04-07 DIAGNOSIS — Z9221 Personal history of antineoplastic chemotherapy: Secondary | ICD-10-CM | POA: Diagnosis not present

## 2015-04-07 DIAGNOSIS — C542 Malignant neoplasm of myometrium: Secondary | ICD-10-CM | POA: Insufficient documentation

## 2015-04-07 DIAGNOSIS — Z853 Personal history of malignant neoplasm of breast: Secondary | ICD-10-CM | POA: Diagnosis not present

## 2015-04-07 DIAGNOSIS — Z79899 Other long term (current) drug therapy: Secondary | ICD-10-CM | POA: Insufficient documentation

## 2015-04-07 DIAGNOSIS — C541 Malignant neoplasm of endometrium: Secondary | ICD-10-CM | POA: Diagnosis not present

## 2015-04-07 DIAGNOSIS — Z791 Long term (current) use of non-steroidal anti-inflammatories (NSAID): Secondary | ICD-10-CM | POA: Insufficient documentation

## 2015-04-07 DIAGNOSIS — C539 Malignant neoplasm of cervix uteri, unspecified: Principal | ICD-10-CM | POA: Insufficient documentation

## 2015-04-07 HISTORY — PX: ROBOTIC ASSISTED TOTAL HYSTERECTOMY WITH BILATERAL SALPINGO OOPHERECTOMY: SHX6086

## 2015-04-07 LAB — TYPE AND SCREEN
ABO/RH(D): A NEG
Antibody Screen: NEGATIVE

## 2015-04-07 SURGERY — ROBOTIC ASSISTED TOTAL HYSTERECTOMY WITH BILATERAL SALPINGO OOPHORECTOMY
Anesthesia: General | Laterality: Bilateral

## 2015-04-07 MED ORDER — STERILE WATER FOR INJECTION IJ SOLN
INTRAMUSCULAR | Status: AC
  Filled 2015-04-07: qty 10

## 2015-04-07 MED ORDER — DEXAMETHASONE SODIUM PHOSPHATE 10 MG/ML IJ SOLN
INTRAMUSCULAR | Status: DC | PRN
  Administered 2015-04-07: 10 mg via INTRAVENOUS

## 2015-04-07 MED ORDER — NEOSTIGMINE METHYLSULFATE 10 MG/10ML IV SOLN
INTRAVENOUS | Status: DC | PRN
  Administered 2015-04-07: 3 mg via INTRAVENOUS

## 2015-04-07 MED ORDER — LACTATED RINGERS IR SOLN
Status: DC | PRN
  Administered 2015-04-07: 1000 mL

## 2015-04-07 MED ORDER — CIPROFLOXACIN IN D5W 400 MG/200ML IV SOLN
INTRAVENOUS | Status: AC
  Filled 2015-04-07: qty 200

## 2015-04-07 MED ORDER — SUCCINYLCHOLINE CHLORIDE 20 MG/ML IJ SOLN
INTRAMUSCULAR | Status: DC | PRN
  Administered 2015-04-07: 80 mg via INTRAVENOUS

## 2015-04-07 MED ORDER — ENOXAPARIN SODIUM 40 MG/0.4ML ~~LOC~~ SOLN
40.0000 mg | SUBCUTANEOUS | Status: AC
  Administered 2015-04-07: 40 mg via SUBCUTANEOUS
  Filled 2015-04-07: qty 0.4

## 2015-04-07 MED ORDER — CLINDAMYCIN PHOSPHATE 900 MG/50ML IV SOLN
INTRAVENOUS | Status: AC
  Filled 2015-04-07: qty 50

## 2015-04-07 MED ORDER — ENOXAPARIN SODIUM 40 MG/0.4ML ~~LOC~~ SOLN
40.0000 mg | SUBCUTANEOUS | Status: DC
  Administered 2015-04-08: 40 mg via SUBCUTANEOUS
  Filled 2015-04-07 (×2): qty 0.4

## 2015-04-07 MED ORDER — SODIUM CHLORIDE 0.9 % IJ SOLN
INTRAMUSCULAR | Status: AC
  Filled 2015-04-07: qty 10

## 2015-04-07 MED ORDER — PROPOFOL 10 MG/ML IV BOLUS
INTRAVENOUS | Status: DC | PRN
  Administered 2015-04-07: 100 mg via INTRAVENOUS

## 2015-04-07 MED ORDER — ARTIFICIAL TEARS OP OINT
TOPICAL_OINTMENT | OPHTHALMIC | Status: AC
  Filled 2015-04-07: qty 3.5

## 2015-04-07 MED ORDER — IBUPROFEN 800 MG PO TABS: 800.0000 mg | ORAL_TABLET | Freq: Three times a day (TID) | ORAL | Status: DC | PRN

## 2015-04-07 MED ORDER — LIDOCAINE HCL (CARDIAC) 20 MG/ML IV SOLN
INTRAVENOUS | Status: AC
  Filled 2015-04-07: qty 5

## 2015-04-07 MED ORDER — LIDOCAINE HCL (CARDIAC) 20 MG/ML IV SOLN
INTRAVENOUS | Status: DC | PRN
  Administered 2015-04-07: 60 mg via INTRAVENOUS

## 2015-04-07 MED ORDER — ONDANSETRON HCL 4 MG PO TABS: 4.0000 mg | ORAL_TABLET | Freq: Four times a day (QID) | ORAL | Status: DC | PRN

## 2015-04-07 MED ORDER — ONDANSETRON HCL 4 MG/2ML IJ SOLN
INTRAMUSCULAR | Status: DC | PRN
  Administered 2015-04-07: 4 mg via INTRAVENOUS

## 2015-04-07 MED ORDER — KCL IN DEXTROSE-NACL 20-5-0.45 MEQ/L-%-% IV SOLN
INTRAVENOUS | Status: DC
  Administered 2015-04-07: 13:00:00 via INTRAVENOUS
  Administered 2015-04-08: 1000 mL via INTRAVENOUS
  Filled 2015-04-07 (×3): qty 1000

## 2015-04-07 MED ORDER — GLYCOPYRROLATE 0.2 MG/ML IJ SOLN
INTRAMUSCULAR | Status: AC
  Filled 2015-04-07: qty 3

## 2015-04-07 MED ORDER — FENTANYL CITRATE (PF) 100 MCG/2ML IJ SOLN
INTRAMUSCULAR | Status: AC
  Filled 2015-04-07: qty 2

## 2015-04-07 MED ORDER — ROCURONIUM BROMIDE 100 MG/10ML IV SOLN
INTRAVENOUS | Status: DC | PRN
  Administered 2015-04-07 (×2): 10 mg via INTRAVENOUS
  Administered 2015-04-07: 30 mg via INTRAVENOUS
  Administered 2015-04-07: 10 mg via INTRAVENOUS

## 2015-04-07 MED ORDER — ONDANSETRON HCL 4 MG/2ML IJ SOLN: 4.0000 mg | Freq: Four times a day (QID) | INTRAMUSCULAR | Status: DC | PRN

## 2015-04-07 MED ORDER — PROPOFOL 10 MG/ML IV BOLUS
INTRAVENOUS | Status: AC
  Filled 2015-04-07: qty 20

## 2015-04-07 MED ORDER — CLINDAMYCIN PHOSPHATE 900 MG/50ML IV SOLN
900.0000 mg | INTRAVENOUS | Status: AC
  Administered 2015-04-07: 900 mg via INTRAVENOUS

## 2015-04-07 MED ORDER — GLYCOPYRROLATE 0.2 MG/ML IJ SOLN
INTRAMUSCULAR | Status: DC | PRN
  Administered 2015-04-07: 0.4 mg via INTRAVENOUS

## 2015-04-07 MED ORDER — FENTANYL CITRATE (PF) 100 MCG/2ML IJ SOLN
INTRAMUSCULAR | Status: AC
  Filled 2015-04-07: qty 4

## 2015-04-07 MED ORDER — FENTANYL CITRATE (PF) 100 MCG/2ML IJ SOLN
INTRAMUSCULAR | Status: DC | PRN
  Administered 2015-04-07 (×6): 50 ug via INTRAVENOUS

## 2015-04-07 MED ORDER — FENTANYL CITRATE (PF) 100 MCG/2ML IJ SOLN
25.0000 ug | INTRAMUSCULAR | Status: DC | PRN
  Administered 2015-04-07 (×3): 50 ug via INTRAVENOUS

## 2015-04-07 MED ORDER — LACTATED RINGERS IV SOLN: INTRAVENOUS | Status: DC

## 2015-04-07 MED ORDER — CIPROFLOXACIN IN D5W 400 MG/200ML IV SOLN
400.0000 mg | INTRAVENOUS | Status: AC
  Administered 2015-04-07: 400 mg via INTRAVENOUS

## 2015-04-07 MED ORDER — FAMOTIDINE 20 MG PO TABS
20.0000 mg | ORAL_TABLET | Freq: Once | ORAL | Status: AC
  Administered 2015-04-07: 20 mg via ORAL
  Filled 2015-04-07: qty 1

## 2015-04-07 MED ORDER — HYDROMORPHONE HCL 1 MG/ML IJ SOLN: 0.2000 mg | INTRAMUSCULAR | Status: DC | PRN

## 2015-04-07 MED ORDER — ROCURONIUM BROMIDE 100 MG/10ML IV SOLN
INTRAVENOUS | Status: AC
  Filled 2015-04-07: qty 1

## 2015-04-07 MED ORDER — STERILE WATER FOR INJECTION IJ SOLN
INTRAMUSCULAR | Status: DC | PRN
  Administered 2015-04-07: 10 mL

## 2015-04-07 MED ORDER — ONDANSETRON HCL 4 MG/2ML IJ SOLN
INTRAMUSCULAR | Status: AC
  Filled 2015-04-07: qty 2

## 2015-04-07 MED ORDER — ONDANSETRON HCL 4 MG/2ML IJ SOLN: 4.0000 mg | Freq: Once | INTRAMUSCULAR | Status: DC | PRN

## 2015-04-07 MED ORDER — STERILE WATER FOR IRRIGATION IR SOLN
Status: DC | PRN
  Administered 2015-04-07: 1000 mL

## 2015-04-07 MED ORDER — LACTATED RINGERS IV SOLN
INTRAVENOUS | Status: DC | PRN
  Administered 2015-04-07 (×2): via INTRAVENOUS

## 2015-04-07 MED ORDER — DEXAMETHASONE SODIUM PHOSPHATE 10 MG/ML IJ SOLN
INTRAMUSCULAR | Status: AC
  Filled 2015-04-07: qty 1

## 2015-04-07 MED ORDER — FENTANYL CITRATE (PF) 250 MCG/5ML IJ SOLN
INTRAMUSCULAR | Status: AC
  Filled 2015-04-07: qty 25

## 2015-04-07 MED ORDER — NEOSTIGMINE METHYLSULFATE 10 MG/10ML IV SOLN
INTRAVENOUS | Status: AC
  Filled 2015-04-07: qty 1

## 2015-04-07 MED ORDER — OXYCODONE-ACETAMINOPHEN 5-325 MG PO TABS
1.0000 | ORAL_TABLET | ORAL | Status: DC | PRN
  Administered 2015-04-07 – 2015-04-08 (×2): 1 via ORAL
  Filled 2015-04-07 (×2): qty 1

## 2015-04-07 SURGICAL SUPPLY — 61 items
BAG SPEC RTRVL LRG 6X4 10 (ENDOMECHANICALS)
CABLE HIGH FREQUENCY MONO STRZ (ELECTRODE) ×1 IMPLANT
CHLORAPREP W/TINT 26ML (MISCELLANEOUS) ×2 IMPLANT
CORDS BIPOLAR (ELECTRODE) ×1 IMPLANT
COVER SURGICAL LIGHT HANDLE (MISCELLANEOUS) ×1 IMPLANT
COVER TIP SHEARS 8 DVNC (MISCELLANEOUS) ×1 IMPLANT
COVER TIP SHEARS 8MM DA VINCI (MISCELLANEOUS) ×1
DRAPE ARM DVNC X/XI (DISPOSABLE) IMPLANT
DRAPE COLUMN DVNC XI (DISPOSABLE) IMPLANT
DRAPE DA VINCI XI ARM (DISPOSABLE) ×4
DRAPE DA VINCI XI COLUMN (DISPOSABLE) ×1
DRAPE SHEET LG 3/4 BI-LAMINATE (DRAPES) ×4 IMPLANT
DRAPE SURG IRRIG POUCH 19X23 (DRAPES) ×2 IMPLANT
DRAPE TABLE BACK 44X90 PK DISP (DRAPES) ×2 IMPLANT
DRAPE WARM FLUID 44X44 (DRAPE) IMPLANT
DRSG TEGADERM 6X8 (GAUZE/BANDAGES/DRESSINGS) ×3 IMPLANT
ELECT REM PT RETURN 9FT ADLT (ELECTROSURGICAL) ×2
ELECTRODE REM PT RTRN 9FT ADLT (ELECTROSURGICAL) ×1 IMPLANT
GLOVE BIO SURGEON STRL SZ 6 (GLOVE) ×6 IMPLANT
GLOVE BIO SURGEON STRL SZ 6.5 (GLOVE) ×5 IMPLANT
GOWN STRL REUS W/ TWL LRG LVL3 (GOWN DISPOSABLE) ×3 IMPLANT
GOWN STRL REUS W/TWL LRG LVL3 (GOWN DISPOSABLE) ×10
HOLDER FOLEY CATH W/STRAP (MISCELLANEOUS) ×2 IMPLANT
KIT ACCESSORY DA VINCI DISP (KITS)
KIT ACCESSORY DVNC DISP (KITS) IMPLANT
KIT BASIN OR (CUSTOM PROCEDURE TRAY) ×2 IMPLANT
KIT PROCEDURE DA VINCI SI (MISCELLANEOUS) ×1
KIT PROCEDURE DVNC SI (MISCELLANEOUS) IMPLANT
LIQUID BAND (GAUZE/BANDAGES/DRESSINGS) ×2 IMPLANT
MANIPULATOR UTERINE 4.5 ZUMI (MISCELLANEOUS) ×1 IMPLANT
NDL SAFETY ECLIPSE 18X1.5 (NEEDLE) ×1 IMPLANT
NDL SPNL 18GX3.5 QUINCKE PK (NEEDLE) IMPLANT
NEEDLE HYPO 18GX1.5 SHARP (NEEDLE) ×2
NEEDLE SPNL 18GX3.5 QUINCKE PK (NEEDLE) ×2 IMPLANT
OCCLUDER COLPOPNEUMO (BALLOONS) ×2 IMPLANT
PAD POSITIONING PINK XL (MISCELLANEOUS) ×2 IMPLANT
PEN SKIN MARKING BROAD (MISCELLANEOUS) ×2 IMPLANT
PORT ACCESS TROCAR AIRSEAL 12 (TROCAR) IMPLANT
PORT ACCESS TROCAR AIRSEAL 5M (TROCAR)
POUCH SPECIMEN RETRIEVAL 10MM (ENDOMECHANICALS) IMPLANT
SEAL CANN UNIV 5-8 DVNC XI (MISCELLANEOUS) ×4 IMPLANT
SEAL XI 5MM-8MM UNIVERSAL (MISCELLANEOUS) ×4
SET TRI-LUMEN FLTR TB AIRSEAL (TUBING) ×2 IMPLANT
SET TUBE IRRIG SUCTION NO TIP (IRRIGATION / IRRIGATOR) ×2 IMPLANT
SHEET LAVH (DRAPES) ×2 IMPLANT
SOLUTION ELECTROLUBE (MISCELLANEOUS) ×2 IMPLANT
SUT VIC AB 0 CT1 27 (SUTURE) ×4
SUT VIC AB 0 CT1 27XBRD ANTBC (SUTURE) ×1 IMPLANT
SUT VIC AB 4-0 PS2 27 (SUTURE) ×4 IMPLANT
SYR 50ML LL SCALE MARK (SYRINGE) ×2 IMPLANT
SYRINGE 10CC LL (SYRINGE) ×4 IMPLANT
TOWEL OR 17X26 10 PK STRL BLUE (TOWEL DISPOSABLE) ×3 IMPLANT
TOWEL OR NON WOVEN STRL DISP B (DISPOSABLE) ×2 IMPLANT
TRAP SPECIMEN MUCOUS 40CC (MISCELLANEOUS) IMPLANT
TRAY FOLEY W/METER SILVER 14FR (SET/KITS/TRAYS/PACK) ×2 IMPLANT
TRAY FOLEY W/METER SILVER 16FR (SET/KITS/TRAYS/PACK) IMPLANT
TRAY LAPAROSCOPIC (CUSTOM PROCEDURE TRAY) ×2 IMPLANT
TROCAR 12M 150ML BLUNT (TROCAR) ×2 IMPLANT
TROCAR BLADELESS OPT 5 100 (ENDOMECHANICALS) ×2 IMPLANT
TROCAR PORT AIRSEAL 5X120 (TROCAR) IMPLANT
WATER STERILE IRR 1500ML POUR (IV SOLUTION) ×3 IMPLANT

## 2015-04-07 NOTE — H&P (View-Only) (Signed)
Consult Note: Gyn-Onc  Pam [name] 74 y.o. female  CC:  Chief Complaint  Patient presents with  . Squamous Cell Carcinoma    New consult    HPI: Patient is seen today in consultation at the request of Dr. Benjie Karvonen for newly diagnosed cervical cancer.  Patient is a very pleasant 74 year old gravida 1 para 1 who became menopausal in her late 3s. She was never on any hormone replacement therapy. Her last GYN exam was approximately 11 years ago. She had an episode of vaginal spotting about 3 weeks ago. It was associated with One-A-Day pain the day prior. She went to see Dr. Benjie Karvonen who is the same physician her daughter sees. She had a Pap smear performed at that time. 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 Her Pap smear revealed high-grade squamous intraepithelial lesion with severe dysplasia cannot exclude microinvasive carcinoma. She underwent an ultrasound revealed the endometrial cavity to have some fluid within it. The endometrial walls were noted to be 5.4 mm. There is 1.7 x 0.7 x 1.8 cm area that the appearance of a polyp that there is no vascularity seen. There was a 2.4 x 2.1 x 2.3 cm lower uterine segment fibroid. They could not identify any abnormalities in the adnexa. Because of the endometrial biopsy results and the appearance of the cervix there was a concern for a squamous cell carcinoma the cervix and she was subsequently referred to [place].  The patient is married though she is not sexually active. As stated her last Pap smear was in 2006 she's never had an abnormal Pap smear. She has lost about 70 pounds in the last 3-4 years. She works every day. Other than some arthritis in her left knee she overall feels very well. She is up-to-date on her mammograms having had one last in July 2016. She had a breast biopsy done in May 2014 that was negative. She has a  personal history of breast cancer treated in 2006 with surgical excision followed by Adriamycin and Cytoxan based chemotherapy. She is up-to-date on her colonoscopy.  Review of Systems  Constitutional: Denies fever. Skin: No rash Cardiovascular: No chest pain, shortness of breath, or edema  Pulmonary: No cough  Gastro Intestinal: Reporting intermittent lower abdominal soreness.  No nausea, vomiting, constipation, or diarrhea reported. No change in bowel movement.  Genitourinary: Denies vaginal bleeding and discharge other than above.  Musculoskeletal: + left knee arthritis pain Neurologic: No weakness, numbness, or change in gait.  Psychology: "not worried:   Current Meds:  Outpatient Encounter Prescriptions as of 03/23/2015  Medication Sig  . Calcium Carbonate (CALCIUM 600 PO) Take 1 tablet by mouth daily.  . Cholecalciferol (VITAMIN D PO) Take 1 capsule by mouth daily.  . diclofenac (VOLTAREN) 75 MG EC tablet Take 75 mg by mouth daily.  . fexofenadine (ALLEGRA) 180 MG tablet Take 180 mg by mouth daily.  . [DISCONTINUED] cephALEXin (KEFLEX) 500 MG capsule 2 caps po bid x 7 days  . [DISCONTINUED] HYDROcodone-acetaminophen (NORCO/VICODIN) 5-325 MG per tablet Take 1 tablet by mouth every 6 (six) hours as needed for severe pain.  . [DISCONTINUED] sulfamethoxazole-trimethoprim (SEPTRA DS) 800-160 MG per tablet Take 1 tablet by mouth every 12 (twelve) hours.   No facility-administered encounter medications on file as of 03/23/2015.    Allergy:  Allergies  Allergen Reactions  . Aspirin Nausea And Vomiting  . Ciprofloxacin   .  Flagyl [Metronidazole]   . Penicillins Hives    Social Hx:   Social History   Social History  . Marital Status: Married    Spouse Name: N/A  . Number of Children: N/A  . Years of Education: N/A   Occupational History  . Not on file.   Social History Main Topics  . Smoking status: Former Smoker -- 30 years    Types: Cigarettes    Quit date: 07/23/1994   . Smokeless tobacco: Not on file  . Alcohol Use: No  . Drug Use: No  . Sexual Activity: Not on file   Other Topics Concern  . Not on file   Social History Narrative    Past Surgical Hx:  Past Surgical History  Procedure Laterality Date  . Mastectomy Left 2006  . Knee arthroscopy Left     Past Medical Hx:  Past Medical History  Diagnosis Date  . Arthritis   . History of breast cancer 2006    Dr. Sonny Dandy oncologist     Oncology Hx:   No history exists.    Family Hx:  Family History  Problem Relation Age of Onset  . Prostate cancer Father   . Heart disease Father   . Breast cancer Sister   . Heart disease Mother     Vitals:  Blood pressure 157/61, pulse 81, temperature 97.7 F (36.5 C), temperature source Oral, resp. rate 16, height 5' 3.5" (1.613 m), weight 125 lb 14.4 oz (57.108 kg).  Physical Exam: Well-nourished well-developed female in no acute distress  Neck: Supple, no lymphadenopathy, no thyromegaly.  Lungs: Clear to auscultation bilaterally.  Cardiac: Regular rate and rhythm.  Abdomen: Soft, nontender, nondistended. No palpable masses or hepatosplenomegaly. No rebound or guarding.  Groins: No lymphadenopathy.  Extremity: No edema.  Pelvic: Normal female genitalia. Vagina is markedly atrophic. The cervix is irregular in appearance with an ulcerated area at the cervical os measured approximately 1 cm. Bimanual exam reveals the cervix to be firm and slightly enlarged. The uterus is of normal size shape and consistency. There are no adnexal masses. Rectovaginal examination reveals no parametrial involvement. The cervix and uterus are freely mobile. Total size of the cervix is 3-4 cm.  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 While cervical biopsy was not performed, the endometrial biopsy revealed fragments of squamous cell carcinoma and clinically this is consistent with stage 1B disease. I spoke with  the patient and her daughter at length. We will proceed with a PET CT which is scheduled for September 7. We will notify her of those results. Pending that her PET/CT does not reveal any extra cervical disease, the patient would very much like to proceed with surgery and she scheduled for a robotic radical hysterectomy, bilateral salpingo-oophorectomy, bilateral pelvic lymph node dissection with Dr. Everitt Amber on September 15. I explained the procedure to the patient and her daughter. She understands that typically patients are in the hospital just over night. She is aware that she will be discharged home with a Foley catheter in her bladder and will return to clinic for voiding trial. She was told that she would be out of work for 4-6 weeks.  We discussed the option that if there is evidence of metastatic disease on her PET scan that she'll be treated with primary chemoradiation.  Her questions as well as those of her daughter were elicited in answer to their satisfaction. They're very pleased it appears to be a  clinical stage I cancer and they are hopeful that surgery alone will be all that is necessary for her treatment.  They will review the printed information was provided to them today. They know that they can call us should they have any questions prior to surgery.  Johnchristopher Sarvis A., MD 03/23/2015, 2:17 PM

## 2015-04-07 NOTE — Anesthesia Postprocedure Evaluation (Signed)
  Anesthesia Post-op Note  Patient: Pam [name]  Procedure(s) Performed: Procedure(s) (LRB): ROBOTIC ASSISTED RADICAL HYSTERECTOMY BILATERAL SALPINGO OOPHORECTOMY BILATERAL SENTINEL LYMPHADENETOMY (Bilateral)  Patient Location: PACU  Anesthesia Type: General  Level of Consciousness: awake and alert   Airway and Oxygen Therapy: Patient Spontanous Breathing  Post-op Pain: mild  Post-op Assessment: Post-op Vital signs reviewed, Patient's Cardiovascular Status Stable, Respiratory Function Stable, Patent Airway and No signs of Nausea or vomiting  Last Vitals:  Filed Vitals:   04/07/15 1141  BP: 130/57  Pulse: 73  Temp: 36.4 C  Resp: 16    Post-op Vital Signs: stable   Complications: No apparent anesthesia complications

## 2015-04-07 NOTE — Anesthesia Preprocedure Evaluation (Addendum)
Anesthesia Evaluation  Patient identified by MRN, date of birth, ID band Patient awake    Reviewed: Allergy & Precautions, NPO status , Patient's Chart, lab work & pertinent test results  History of Anesthesia Complications Negative for: history of anesthetic complications  Airway Mallampati: II  TM Distance: >3 FB Neck ROM: Full    Dental no notable dental hx. (+) Edentulous Upper, Edentulous Lower   Pulmonary former smoker,    Pulmonary exam normal breath sounds clear to auscultation       Cardiovascular negative cardio ROS Normal cardiovascular exam Rhythm:Regular Rate:Normal     Neuro/Psych negative neurological ROS  negative psych ROS   GI/Hepatic negative GI ROS, Neg liver ROS,   Endo/Other  negative endocrine ROS  Renal/GU negative Renal ROS  negative genitourinary   Musculoskeletal  (+) Arthritis ,   Abdominal   Peds negative pediatric ROS (+)  Hematology negative hematology ROS (+)   Anesthesia Other Findings Hx of breast ca  Reproductive/Obstetrics negative OB ROS                            Anesthesia Physical Anesthesia Plan  ASA: II  Anesthesia Plan: General   Post-op Pain Management:    Induction: Intravenous  Airway Management Planned: Oral ETT  Additional Equipment:   Intra-op Plan:   Post-operative Plan: Extubation in OR  Informed Consent: I have reviewed the patients History and Physical, chart, labs and discussed the procedure including the risks, benefits and alternatives for the proposed anesthesia with the patient or authorized representative who has indicated his/her understanding and acceptance.   Dental advisory given  Plan Discussed with: CRNA  Anesthesia Plan Comments:         Anesthesia Quick Evaluation

## 2015-04-07 NOTE — Op Note (Signed)
OPERATIVE NOTE 04/07/15  Surgeon: Donaciano Eva   Assistants: Lahoma Crocker MD (an MD assistant was necessary for tissue manipulation, management of robotic instrumentation, retraction and positioning due to the complexity of the case and hospital policies).   Anesthesia: General endotracheal anesthesia  ASA Class: 3   Pre-operative Diagnosis: stage IB1 cervical cancer  Post-operative Diagnosis: same  Operation: Robotic-assisted type III radical laparoscopic hysterectomy with bilateral salpingoophorectomy and SLN mapping with bilateral pelvic sentinel lymph node biopsy  Surgeon: Donaciano Eva  Assistant Surgeon: Lahoma Crocker MD  Anesthesia: GET  Urine Output: 100  Operative Findings:  : 2cm ectocervical tumor,bilateral mapping, no suspicious lymph nodes or gross vaginal or parametrial involvement.  Estimated Blood Loss:  20cc      Total IV Fluids: 1,000 ml         Specimens: uterus, cervix, upper vagina, bilateral tubes and ovaries; right mid obturator SLN, right proximal obturator SLN, right external iliac SLN, left external iliac SLN, true new left distal parametrial margin         Complications:  None; patient tolerated the procedure well.         Disposition: PACU - hemodynamically stable.  Procedure Details  The patient was seen in the Holding Room. The risks, benefits, complications, treatment options, and expected outcomes were discussed with the patient.  The patient concurred with the proposed plan, giving informed consent.  The site of surgery properly noted/marked. The patient was identified as Pam [name] and the procedure verified as a Robotic-assisted radical hysterectomy with bilateral salpingo oophorectomy and bilateral pelvic lymphadenectomy. A Time Out was held and the above information confirmed.  After induction of anesthesia, the patient was draped and prepped in the usual sterile manner. Pt was placed in supine position after  anesthesia and draped and prepped in the usual sterile manner. The abdominal drape was placed after the CholoraPrep had been allowed to dry for 3 minutes.  Her arms were tucked to her side with all appropriate precautions.  The shoulders are stabilized with padded shoulder blocks.  The patient was placed in the semi-lithotomy position in [place].  The perineum was prepped with Betadine.  1mg  total of ICG was injected into the cervical stroma circumferentially around the tumor (peri-tumoral) 58mm depth (concentration 0..5mg /ml).  Foley catheter was placed.  An EEA sizer (large size) was placed vaginally to define the vaginal fornices.  A second time-out was performed.  OG tube placement was confirmed and to suction.    The patient was then draped in the normal manner.  Next, a 5 mm skin incision was made 1 cm below the subcostal margin in the midclavicular line.  The 5 mm Optiview port and scope was used for direct entry.  Opening pressure was under 10 mm CO2.  The abdomen was insufflated and the findings were noted as above.   At this point and all points during the procedure, the patient's intra-abdominal pressure did not exceed 15 mmHg. Next, a 10 mm skin incision was made at the umbilicus and a right and left port was placed about 10 cm lateral to the robot port on the right and left side.  A fourth arm was placed in the left lower quadrant 2 cm above and superior and medial to the anterior superior iliac spine.  All ports were placed under direct visualization.  The patient was placed in steep Trendelenburg.  Bowel was away into the upper abdomen.  The robot was docked in the normal manner.  The right retroperitoneum in the pelvis was opened parallel to the IP ligament. The right paravesical space was developed with monopolar and sharp dissection. It was held open with tension on the median umbilical ligament with the forth arm. The pararectal space was opened with blunt and sharp dissection to  mobilize the ureter off of the medial surface of the internal iliac artery. The medial leaf of the broad ligament containing the ureter was held medially (opening the pararectal space) by the assistant's grasper. The right and left peritoneum were opened parallel to the IP ligament to open the retroperitoneal spaces bilaterally. The SLN mapping was performed in bilateral pelvic basins. The para rectal and paravesical spaces were opened up. Lymphatic channels were identified travelling to the following visualized sentinel lymph node's: right mid obturator SLN, right proximal obturator SLN, right external iliac SLN, and left external iliac SLN. These SLN's were separated from their surrounding lymphatic tissue, removed and sent for permanent pathology.  The radical hysterectomy was begun by first skeletonizing the right uterine artery at its origin from the internal iliac artery by skeletonizing it 360 degrees and defining the parauterine web between the paravesical and pararectal spaces. The right ureter was skeletonized off of its attachments to the broad ligament and mobilized laterally. Using meticulous blunt and sparing monopolar dissection in short controlled bursts, the right ureter was untunnelled from under the right uterine artery. The anterior vessicouterine ligament was developed and the bladder flap was taken down to below the level of the tumor and below the rounded portion of the EEA sizer. This then definied the anterior bladder pillar. The uterine artery was bipolar fulgarated to seal it, then transected. The uterine vein was also sealed and resected. The lateral boundary of the parametrium was taken down with biploar and monopolar dissection to the level of the deep vaginal vein. The uterine vessels were retracted superior and medially over the ureter on the right. The ureter was untunnelled through to its entry into the bladder with meticulous sharp dissection and short bursts of monopolar energy.  The ureter was dissected off of its attachments to the anterior vagina. The anterior bladder pillar was skeletonized and sealed with bipolar energy while the ureter was deflected distally. The posterior bladder pillar was also dissected with monopolar scissors from the upper vagina.  The rectovaginal septum was entered posteriorally with sharp dissection and the rectum was dissected off of the vagina. A window was created in the broad ligament with care to mobilize the ureter laterally. This skeletonized the IP ligament which was sealed with bipolar energy and transected.The right uterosacral ligament was transected with bipolar and monopolar energy 1/2 to 2/3rds of the way towards the uterosacral ligament insertion into the sacrum. In doing so meticulous attention was made to identify and wherever possible, spare the hypogastric nerves. The right paravaginal tissues were tubularized around the vagina on the right at the inferior boundary of the dissection.  The left radical hysterectomy was then performed by first skeletonizing the left uterine artery at its origin from the internal iliac artery by skeletonizing it 360 degrees and defining the parauterine web between the paravesical and pararectal spaces. The left ureter was skeletonized off of its attachments to the broad ligament and mobilized laterally. Using meticulous blunt and sparing monopolar dissection in short controlled bursts, the left ureter was untunnelled from under the left uterine artery. The left anterior vessicouterine ligament was developed and the bladder flap was taken down to below the level of the tumor  and below the rounded portion of the EEA sizer. This then definied the anterior bladder pillar. The uterine artery was bipolar fulgarated to seal it, then transected. The uterine vein was also sealed and resected. The lateral boundary of the parametrium was taken down with biploar and monopolar dissection to the level of the deep vaginal  vein. The uterine vessels were retracted superior and medially over the ureter on the right. The ureter was untunnelled through to its entry into the bladder with meticulous sharp dissection and short bursts of monopolar energy. The ureter was dissected off of its attachments to the anterior vagina. The anterior bladder pillar was skeletonized and sealed with bipolar energy while the ureter was deflected distally. The posterior bladder pillar was also dissected with monopolar scissors from the upper vagina.  The rectovaginal septum was entered posteriorally with sharp dissection and the rectum was dissected off of the vagina. A window was created in the broad ligament with care to mobilize the ureter laterally. This skeletonized the IP ligament which was sealed with bipolar energy and transected.The left uterosacral ligament was transected with bipolar and monopolar energy 1/2 to 2/3rds of the way towards the uterosacral ligament insertion into the sacrum. In doing so meticulous attention was made to identify and wherever possible, spare the hypogastric nerves. The left paravaginal tissues were tubularized around the vagina on the left at the inferior boundary of the dissection.  The colpotomy was made and the uterus, cervix, bilateral ovaries and tubes were amputated and delivered through the vagina. The specimen was inspected. The left parametrial margin appeared closer to the palpable tumor than the left and therefore an additional left parametrial margin was dissected.  Pedicles were inspected and excellent hemostasis was achieved.    The colpotomy at the vaginal cuff was closed with two 0-Vicryl on a CT1 needle in a running manner.  Irrigation was used and excellent hemostasis was achieved.  At this point in the procedure was completed.  Robotic instruments were removed under direct visulaization.  The robot was undocked. The 10 mm ports were closed with Vicryl on a UR-5 needle and the fascia was closed  with 0 Vicryl on a UR-5 needle.  The skin was closed with 4-0 Vicryl in a subcuticular manner.  Dermabond was applied.  Sponge, lap and needle counts correct x 2.  The patient was taken to the recovery room in stable condition.  The vagina was swabbed with  minimal bleeding noted.   All instrument and needle counts were correct x  3.   The patient was transferred to the recovery room in a stable condition.  Donaciano Eva, MD

## 2015-04-07 NOTE — Anesthesia Procedure Notes (Signed)
Procedure Name: Intubation Date/Time: 04/07/2015 7:45 AM Performed by: Montel Clock Pre-anesthesia Checklist: Patient identified, Emergency Drugs available, Suction available, Patient being monitored and Timeout performed Patient Re-evaluated:Patient Re-evaluated prior to inductionOxygen Delivery Method: Circle system utilized Preoxygenation: Pre-oxygenation with 100% oxygen Intubation Type: IV induction Ventilation: Mask ventilation without difficulty Laryngoscope Size: Mac and 3 Grade View: Grade I Tube type: Oral Tube size: 7.5 mm Number of attempts: 1 Airway Equipment and Method: Stylet Placement Confirmation: ETT inserted through vocal cords under direct vision,  positive ETCO2 and breath sounds checked- equal and bilateral Secured at: 21 cm Tube secured with: Tape Dental Injury: Teeth and Oropharynx as per pre-operative assessment

## 2015-04-07 NOTE — Transfer of Care (Signed)
Immediate Anesthesia Transfer of Care Note  Patient: Pam [name]  Procedure(s) Performed: Procedure(s): ROBOTIC ASSISTED RADICAL HYSTERECTOMY BILATERAL SALPINGO OOPHORECTOMY BILATERAL SENTINEL LYMPHADENETOMY (Bilateral)  Patient Location: PACU  Anesthesia Type:General  Level of Consciousness: awake, alert , oriented and patient cooperative  Airway & Oxygen Therapy: Patient Spontanous Breathing and Patient connected to face mask oxygen  Post-op Assessment: Report given to RN, Post -op Vital signs reviewed and stable and Patient moving all extremities  Post vital signs: Reviewed and stable  Last Vitals:  Filed Vitals:   04/07/15 0529  BP: 137/63  Pulse: 72  Temp: 36.5 C  Resp: 18    Complications: No apparent anesthesia complications

## 2015-04-07 NOTE — Interval H&P Note (Signed)
History and Physical Interval Note:  04/07/2015 7:18 AM  Pam [name]  has presented today for surgery, with the diagnosis of CERVICAL CANCER  The various methods of treatment have been discussed with the patient and family. After consideration of risks, benefits and other options for treatment, the patient has consented to  Procedure(s): ROBOTIC Inez (Bilateral) as a surgical intervention .  The patient's history has been reviewed, patient examined, no change in status, stable for surgery.  I have reviewed the patient's chart and labs.  Questions were answered to the patient's satisfaction.     Donaciano Eva

## 2015-04-08 DIAGNOSIS — C539 Malignant neoplasm of cervix uteri, unspecified: Secondary | ICD-10-CM | POA: Diagnosis not present

## 2015-04-08 LAB — BASIC METABOLIC PANEL
Anion gap: 7 (ref 5–15)
BUN: 11 mg/dL (ref 6–20)
CHLORIDE: 104 mmol/L (ref 101–111)
CO2: 28 mmol/L (ref 22–32)
CREATININE: 0.69 mg/dL (ref 0.44–1.00)
Calcium: 8.6 mg/dL — ABNORMAL LOW (ref 8.9–10.3)
GFR calc Af Amer: >60 mL/min (ref >60)
GFR calc non Af Amer: >60 mL/min (ref >60)
Glucose, Bld: 167 mg/dL — ABNORMAL HIGH (ref 65–99)
POTASSIUM: 4.3 mmol/L (ref 3.5–5.1)
SODIUM: 139 mmol/L (ref 135–145)

## 2015-04-08 LAB — CBC
HEMATOCRIT: 35.8 % — AB (ref 36.0–46.0)
HEMOGLOBIN: 11.9 g/dL — AB (ref 12.0–15.0)
MCH: 28.7 pg (ref 26.0–34.0)
MCHC: 33.2 g/dL (ref 30.0–36.0)
MCV: 86.5 fL (ref 78.0–100.0)
Platelets: 264 10*3/uL (ref 150–400)
RBC: 4.14 MIL/uL (ref 3.87–5.11)
RDW: 13.2 % (ref 11.5–15.5)
WBC: 14.3 10*3/uL — ABNORMAL HIGH (ref 4.0–10.5)

## 2015-04-08 MED ORDER — HYDROCODONE-ACETAMINOPHEN 5-325 MG PO TABS: 1.0000 | ORAL_TABLET | Freq: Four times a day (QID) | ORAL | Status: DC | PRN

## 2015-04-08 MED ORDER — OXYCODONE-ACETAMINOPHEN 5-325 MG PO TABS: 1.0000 | ORAL_TABLET | ORAL | Status: DC | PRN

## 2015-04-08 NOTE — Discharge Instructions (Addendum)
04/08/2015  Return to work: 4-6 weeks if applicable  Activity: 1. Be up and out of the bed during the day.  Take a nap if needed.  You may walk up steps but be careful and use the hand rail.  Stair climbing will tire you more than you think, you may need to stop part way and rest.   2. No lifting or straining for 6 weeks.  3. No driving for 1 week(s).  Do not drive if you are taking narcotic pain medicine.  4. Shower daily.  Use soap and water on your incision and pat dry; don't rub.  No tub baths until cleared by your surgeon.   5. No sexual activity and nothing in the vagina for 8 weeks.  6. You may experience a small amount of clear drainage from your incisions, which is normal.  If the drainage persists or increases, please call the office.   Diet: 1. Low sodium Heart Healthy Diet is recommended.  2. It is safe to use a laxative, such as Miralax or Colace, if you have difficulty moving your bowels.   Wound Care: 1. Keep clean and dry.  Shower daily.  Reasons to call the Doctor:  Fever - Oral temperature greater than 100.4 degrees Fahrenheit  Foul-smelling vaginal discharge  Difficulty urinating  Nausea and vomiting  Increased pain at the site of the incision that is unrelieved with pain medicine.  Difficulty breathing with or without chest pain  New calf pain especially if only on one side  Sudden, continuing increased vaginal bleeding with or without clots.   Contacts: For questions or concerns you should contact:  Dr. Everitt Amber at [phone]  Joylene John, NP at [phone]  After Hours: call [phone] and ask for the GYN Oncologist on call  Foley Catheter Care A Foley catheter is a soft, flexible tube that is placed into the bladder to drain urine. A Foley catheter may be inserted if:  You leak urine or are not able to control when you urinate (urinary incontinence).  You are not able to urinate when you need to (urinary retention).  You had  prostate surgery or surgery on the genitals.  You have certain medical conditions, such as multiple sclerosis, dementia, or a spinal cord injury. If you are going home with a Foley catheter in place, follow the instructions below. TAKING CARE OF THE CATHETER 1. Wash your hands with soap and water. 2. Using mild soap and warm water on a clean washcloth:  Clean the area on your body closest to the catheter insertion site using a circular motion, moving away from the catheter. Never wipe toward the catheter because this could sweep bacteria up into the urethra and cause infection.  Remove all traces of soap. Pat the area dry with a clean towel. For males, reposition the foreskin. 3. Attach the catheter to your leg so there is no tension on the catheter. Use adhesive tape or a leg strap. If you are using adhesive tape, remove any sticky residue left behind by the previous tape you used. 4. Keep the drainage bag below the level of the bladder, but keep it off the floor. 5. Check throughout the day to be sure the catheter is working and urine is draining freely. Make sure the tubing does not become kinked. 6. Do not pull on the catheter or try to remove it. Pulling could damage internal tissues. TAKING CARE OF THE DRAINAGE BAGS You will be given two drainage bags to take  home. One is a large overnight drainage bag, and the other is a smaller leg bag that fits underneath clothing. You may wear the overnight bag at any time, but you should never wear the smaller leg bag at night. Follow the instructions below for how to empty, change, and clean your drainage bags. Emptying the Drainage Bag You must empty your drainage bag when it is  - full or at least 2-3 times a day. 1. Wash your hands with soap and water. 2. Keep the drainage bag below your hips, below the level of your bladder. This stops urine from going back into the tubing and into your bladder. 3. Hold the dirty bag over the toilet or a clean  container. 4. Open the pour spout at the bottom of the bag and empty the urine into the toilet or container. Do not let the pour spout touch the toilet, container, or any other surface. Doing so can place bacteria on the bag, which can cause an infection. 5. Clean the pour spout with a gauze pad or cotton ball that has rubbing alcohol on it. 6. Close the pour spout. 7. Attach the bag to your leg with adhesive tape or a leg strap. 8. Wash your hands well. Changing the Drainage Bag Change your drainage bag once a month or sooner if it starts to smell bad or look dirty. Below are steps to follow when changing the drainage bag. 1. Wash your hands with soap and water. 2. Pinch off the rubber catheter so that urine does not spill out. 3. Disconnect the catheter tube from the drainage tube at the connection valve. Do not let the tubes touch any surface. 4. Clean the end of the catheter tube with an alcohol wipe. Use a different alcohol wipe to clean the end of the drainage tube. 5. Connect the catheter tube to the drainage tube of the clean drainage bag. 6. Attach the new bag to the leg with adhesive tape or a leg strap. Avoid attaching the new bag too tightly. 7. Wash your hands well. Cleaning the Drainage Bag 1. Wash your hands with soap and water. 2. Wash the bag in warm, soapy water. 3. Rinse the bag thoroughly with warm water. 4. Fill the bag with a solution of white vinegar and water (1 cup vinegar to 1 qt warm water [.2 L vinegar to 1 L warm water]). Close the bag and soak it for 30 minutes in the solution. 5. Rinse the bag with warm water. 6. Hang the bag to dry with the pour spout open and hanging downward. 7. Store the clean bag (once it is dry) in a clean plastic bag. 8. Wash your hands well. PREVENTING INFECTION  Wash your hands before and after handling your catheter.  Take showers daily and wash the area where the catheter enters your body. Do not take baths. Replace wet leg straps  with dry ones, if this applies.  Do not use powders, sprays, or lotions on the genital area. Only use creams, lotions, or ointments as directed by your caregiver.  For females, wipe from front to back after each bowel movement.  Drink enough fluids to keep your urine clear or pale yellow unless you have a fluid restriction.  Do not let the drainage bag or tubing touch or lie on the floor.  Wear cotton underwear to absorb moisture and to keep your skin drier. SEEK MEDICAL CARE IF:   Your urine is cloudy or smells unusually bad.  Your  catheter becomes clogged.  You are not draining urine into the bag or your bladder feels full.  Your catheter starts to leak. SEEK IMMEDIATE MEDICAL CARE IF:   You have pain, swelling, redness, or pus where the catheter enters the body.  You have pain in the abdomen, legs, lower back, or bladder.  You have a fever.  You see blood fill the catheter, or your urine is pink or red.  You have nausea, vomiting, or chills.  Your catheter gets pulled out. MAKE SURE YOU:   Understand these instructions.  Will watch your condition.  Will get help right away if you are not doing well or get worse. Document Released: 07/09/2005 Document Revised: 11/23/2013 Document Reviewed: 06/30/2012 [facility] Patient Information 2015 [place], [place]. This information is not intended to replace advice given to you by your health care provider. Make sure you discuss any questions you have with your health care provider.  Acetaminophen; Hydrocodone tablets or capsules What is this medicine? ACETAMINOPHEN; HYDROCODONE (a set a MEE noe fen; hye droe KOE done) is a pain reliever. It is used to treat mild to moderate pain. This medicine may be used for other purposes; ask your health care provider or pharmacist if you have questions. COMMON BRAND NAME(S): Anexsia, Bancap HC, Ceta-Plus, Co-Gesic, Comfortpak, Dolagesic, [employer], DuoCet, Hydrocet, Hydrogesic, [place],  Lorcet HD, Lorcet Plus, Lortab, Margesic H, Maxidone, [place], Polygesic, [place], [place], [place], Vicodin, Vicodin ES, Vicodin HP, Charlane Ferretti What should I tell my health care provider before I take this medicine? They need to know if you have any of these conditions: -brain tumor -Crohn's disease, inflammatory bowel disease, or ulcerative colitis -drug abuse or addiction -head injury -heart or circulation problems -if you often drink alcohol -kidney disease or problems going to the bathroom -liver disease -lung disease, asthma, or breathing problems -an unusual or allergic reaction to acetaminophen, hydrocodone, other opioid analgesics, other medicines, foods, dyes, or preservatives -pregnant or trying to get pregnant -breast-feeding How should I use this medicine? Take this medicine by mouth. Swallow it with a full glass of water. Follow the directions on the prescription label. If the medicine upsets your stomach, take the medicine with food or milk. Do not take more than you are told to take. Talk to your pediatrician regarding the use of this medicine in children. This medicine is not approved for use in children. Overdosage: If you think you have taken too much of this medicine contact a poison control center or emergency room at once. NOTE: This medicine is only for you. Do not share this medicine with others. What if I miss a dose? If you miss a dose, take it as soon as you can. If it is almost time for your next dose, take only that dose. Do not take double or extra doses. What may interact with this medicine? -alcohol -antihistamines -isoniazid -medicines for depression, anxiety, or psychotic disturbances -medicines for sleep -muscle relaxants -naltrexone -narcotic medicines (opiates) for pain -phenobarbital -ritonavir -tramadol This list may not describe all possible interactions. Give your health care provider a list of all the medicines, herbs, non-prescription  drugs, or dietary supplements you use. Also tell them if you smoke, drink alcohol, or use illegal drugs. Some items may interact with your medicine. What should I watch for while using this medicine? Tell your doctor or health care professional if your pain does not go away, if it gets worse, or if you have new or a different type of pain. You may  develop tolerance to the medicine. Tolerance means that you will need a higher dose of the medicine for pain relief. Tolerance is normal and is expected if you take the medicine for a long time. Do not suddenly stop taking your medicine because you may develop a severe reaction. Your body becomes used to the medicine. This does NOT mean you are addicted. Addiction is a behavior related to getting and using a drug for a non-medical reason. If you have pain, you have a medical reason to take pain medicine. Your doctor will tell you how much medicine to take. If your doctor wants you to stop the medicine, the dose will be slowly lowered over time to avoid any side effects. You may get drowsy or dizzy when you first start taking the medicine or change doses. Do not drive, use machinery, or do anything that may be dangerous until you know how the medicine affects you. Stand or sit up slowly. There are different types of narcotic medicines (opiates) for pain. If you take more than one type at the same time, you may have more side effects. Give your health care provider a list of all medicines you use. Your doctor will tell you how much medicine to take. Do not take more medicine than directed. Call emergency for help if you have problems breathing. The medicine will cause constipation. Try to have a bowel movement at least every 2 to 3 days. If you do not have a bowel movement for 3 days, call your doctor or health care professional. Too much acetaminophen can be very dangerous. Do not take Tylenol (acetaminophen) or medicines that contain acetaminophen with this  medicine. Many non-prescription medicines contain acetaminophen. Always read the labels carefully. What side effects may I notice from receiving this medicine? Side effects that you should report to your doctor or health care professional as soon as possible: -allergic reactions like skin rash, itching or hives, swelling of the face, lips, or tongue -breathing problems -confusion -feeling faint or lightheaded, falls -stomach pain -yellowing of the eyes or skin Side effects that usually do not require medical attention (report to your doctor or health care professional if they continue or are bothersome): -nausea, vomiting -stomach upset This list may not describe all possible side effects. Call your doctor for medical advice about side effects. You may report side effects to FDA at 1-800-FDA-1088. Where should I keep my medicine? Keep out of the reach of children. This medicine can be abused. Keep your medicine in a safe place to protect it from theft. Do not share this medicine with anyone. Selling or giving away this medicine is dangerous and against the law. Store at room temperature between 15 and 30 degrees C (59 and 86 degrees F). Protect from light. Keep container tightly closed. Throw away any unused medicine after the expiration date. Discard unused medicine and used packaging carefully. Pets and children can be harmed if they find used or lost packages. NOTE: This sheet is a summary. It may not cover all possible information. If you have questions about this medicine, talk to your doctor, pharmacist, or health care provider.  2015, [employer]vier/Gold Standard. (2013-03-02 13:15:56)

## 2015-04-08 NOTE — Discharge Summary (Signed)
Physician Discharge Summary  Patient ID: Pam [name] MRN: [id number] DOB/AGE: [id number] 74 y.o.  Admit date: 04/07/2015 Discharge date: 04/08/2015  Admission Diagnoses: Cervical cancer  Discharge Diagnoses:  Cervical Cancer  Discharged Condition:  The patient is in good condition and stable for discharge.    Hospital Course: On 04/07/2015, the patient underwent the following: Procedure(s): ROBOTIC [place].  The postoperative course was uneventful.  She was discharged to home on postoperative day 1 tolerating a regular diet, passing flatus, minimal pain, foley in place, ambulating without difficulty.  Foley care was discussed with the daughter and patient and she will return to the office on 04/14/15 for trial voiding.  Consults: None  Significant Diagnostic Studies: None  Treatments: surgery: see above  Discharge Exam: Blood pressure 122/49, pulse 81, temperature 98.4 F (36.9 C), temperature source Oral, resp. rate 18, height 5\' 3"  (1.6 m), weight 125 lb (56.7 kg), SpO2 97 %. General appearance: alert, cooperative and no distress Resp: clear to auscultation bilaterally Cardio: regular rate and rhythm, S1, S2 normal, no murmur, click, rub or gallop GI: soft, non-tender; bowel sounds normal; no masses,  no organomegaly Extremities: extremities normal, atraumatic, no cyanosis or edema Incision/Wound: Lap sites to the abdomen with dermabond without erythema or drainage  Disposition: 01-Home or Self Care  Discharge Instructions    Call MD for:  difficulty breathing, headache or visual disturbances    Complete by:  As directed      Call MD for:  extreme fatigue    Complete by:  As directed      Call MD for:  hives    Complete by:  As directed      Call MD for:  persistant dizziness or light-headedness    Complete by:  As directed      Call MD for:  persistant nausea and vomiting     Complete by:  As directed      Call MD for:  redness, tenderness, or signs of infection (pain, swelling, redness, odor or green/yellow discharge around incision site)    Complete by:  As directed      Call MD for:  severe uncontrolled pain    Complete by:  As directed      Call MD for:  temperature >100.4    Complete by:  As directed      Diet - low sodium heart healthy    Complete by:  As directed      Discharge instructions    Complete by:  As directed   Maintain foley catheter for one week then come to the office for removal     Driving Restrictions    Complete by:  As directed   No driving for 1 week.  Do not take narcotics and drive.     Increase activity slowly    Complete by:  As directed      Lifting restrictions    Complete by:  As directed   No lifting greater than 10 lbs.     Sexual Activity Restrictions    Complete by:  As directed   No sexual activity, nothing in the vagina, for 8 weeks.            Medication List    TAKE these medications        CALCIUM 600 PO  Take 1 tablet by mouth daily.     diclofenac 75 MG EC tablet  Commonly known as:  VOLTAREN  Take 75 mg by mouth daily.     fexofenadine 180 MG tablet  Commonly known as:  ALLEGRA  Take 180 mg by mouth daily.     HYDROcodone-acetaminophen 5-325 MG per tablet  Commonly known as:  NORCO/VICODIN  Take 1-2 tablets by mouth every 6 (six) hours as needed for moderate pain.     VITAMIN D PO  Take 1 capsule by mouth daily.           Follow-up Information    Follow up with CROSS, MELISSA DEAL, NP On 04/14/2015.   Specialty:  Gynecologic Oncology   Why:  at 10am at the [facility] for foley removal   Contact information:   [place] [address] [phone]       Follow up with Donaciano Eva, MD On 05/09/2015.   Specialty:  Obstetrics and Gynecology   Why:  at 1:30pm at the [facility] information:   [place] [address] [phone]        Greater than thirty minutes were spend for face to face discharge instructions and discharge orders/summary in EPIC.   Signed: CROSS, MELISSA DEAL 04/08/2015, 11:06 AM

## 2015-04-13 ENCOUNTER — Other Ambulatory Visit: Payer: Self-pay | Admitting: Gynecologic Oncology

## 2015-04-13 ENCOUNTER — Telehealth: Payer: Self-pay | Admitting: Gynecologic Oncology

## 2015-04-13 DIAGNOSIS — C539 Malignant neoplasm of cervix uteri, unspecified: Secondary | ICD-10-CM

## 2015-04-13 NOTE — Telephone Encounter (Signed)
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 Therefore she meets "S[place]lis" criteria for a recommendation for adjuvant radiation (whole pelvic with vaginal brachy) to reduce the risk of local recurrence.  We will set her up to see me in 2 weeks and Dr Sondra Come in 4-6 to start adjuvant therapy thereafter.  She states she is doing well with no complaints.

## 2015-04-14 ENCOUNTER — Telehealth: Payer: Self-pay | Admitting: *Deleted

## 2015-04-14 ENCOUNTER — Ambulatory Visit: Payer: Medicare Other | Attending: Gynecologic Oncology | Admitting: Gynecologic Oncology

## 2015-04-14 VITALS — BP 140/79 | HR 89 | Temp 97.7°F | Resp 18 | Ht 63.0 in | Wt 129.9 lb

## 2015-04-14 DIAGNOSIS — C539 Malignant neoplasm of cervix uteri, unspecified: Secondary | ICD-10-CM | POA: Insufficient documentation

## 2015-04-14 DIAGNOSIS — R600 Localized edema: Secondary | ICD-10-CM

## 2015-04-14 MED ORDER — HYDROCHLOROTHIAZIDE 12.5 MG PO TABS: 12.5000 mg | ORAL_TABLET | Freq: Every day | ORAL | Status: DC

## 2015-04-14 NOTE — Patient Instructions (Signed)
Doing well.  Please call the office for any issues including difficulty urinating, burning with urination, fever, chills.  Plan to meet with Dr. Sondra Come in Radiation Oncology.  Please call any questions or concerns.

## 2015-04-14 NOTE — Telephone Encounter (Signed)
Received phone call from patient stating she is experiencing some pain on her tailbone area since being home from office visit earlier today. Patient states she has urinated several times since being home without any problems. She states she took 1 Norco tablet and the pain is beginning to ease off. Patient states pain is only located directly over her tailbone. Per pt, she has arthritis and the exam tables tend to irritate her back. Instructed patient to call office in the morning with an update - patient agreeable to this.

## 2015-04-15 ENCOUNTER — Other Ambulatory Visit (HOSPITAL_BASED_OUTPATIENT_CLINIC_OR_DEPARTMENT_OTHER): Payer: Medicare Other

## 2015-04-15 ENCOUNTER — Ambulatory Visit: Payer: Medicare Other | Attending: Gynecologic Oncology | Admitting: Gynecologic Oncology

## 2015-04-15 VITALS — BP 142/79 | HR 99 | Temp 98.5°F | Resp 18

## 2015-04-15 DIAGNOSIS — M545 Low back pain, unspecified: Secondary | ICD-10-CM

## 2015-04-15 DIAGNOSIS — R338 Other retention of urine: Secondary | ICD-10-CM | POA: Insufficient documentation

## 2015-04-15 LAB — URINALYSIS, MICROSCOPIC - CHCC
Bilirubin (Urine): NEGATIVE
GLUCOSE UR CHCC: NEGATIVE mg/dL
Ketones: NEGATIVE mg/dL
NITRITE: NEGATIVE
PROTEIN: NEGATIVE mg/dL
SPECIFIC GRAVITY, URINE: 1.005 (ref 1.003–1.035)
UROBILINOGEN UR: 0.2 mg/dL (ref 0.2–1)
pH: 6 (ref 4.6–8.0)

## 2015-04-15 NOTE — Patient Instructions (Signed)
Continue with foley care. Please call for any questions or concerns.  Foley Catheter Care A Foley catheter is a soft, flexible tube. This tube is placed into your bladder to drain pee (urine). If you go home with this catheter in place, follow the instructions below. TAKING CARE OF THE CATHETER 1. Wash your hands with soap and water. 2. Put soap and water on a clean washcloth.  Clean the skin where the tube goes into your body.  Clean away from the tube site.  Never wipe toward the tube.  Clean the area using a circular motion.  Remove all the soap. Pat the area dry with a clean towel. For males, reposition the skin that covers the end of the penis (foreskin). 3. Attach the tube to your leg with tape or a leg strap. Do not stretch the tube tight. If you are using tape, remove any stickiness left behind by past tape you used. 4. Keep the drainage bag below your hips. Keep it off the floor. 5. Check your tube during the day. Make sure it is working and draining. Make sure the tube does not curl, twist, or bend. 6. Do not pull on the tube or try to take it out. TAKING CARE OF THE DRAINAGE BAGS You will have a large overnight drainage bag and a small leg bag. You may wear the overnight bag any time. Never wear the small bag at night. Follow the directions below. Emptying the Drainage Bag Empty your drainage bag when it is  - full or at least 2-3 times a day. 1. Wash your hands with soap and water. 2. Keep the drainage bag below your hips. 3. Hold the dirty bag over the toilet or clean container. 4. Open the pour spout at the bottom of the bag. Empty the pee into the toilet or container. Do not let the pour spout touch anything. 5. Clean the pour spout with a gauze pad or cotton ball that has rubbing alcohol on it. 6. Close the pour spout. 7. Attach the bag to your leg with tape or a leg strap. 8. Wash your hands well. Changing the Drainage Bag Change your bag once a month or sooner if  it starts to smell or look dirty.  1. Wash your hands with soap and water. 2. Pinch the rubber tube so that pee does not spill out. 3. Disconnect the catheter tube from the drainage tube at the connection valve. Do not let the tubes touch anything. 4. Clean the end of the catheter tube with an alcohol wipe. Clean the end of a the drainage tube with a different alcohol wipe. 5. Connect the catheter tube to the drainage tube of the clean drainage bag. 6. Attach the new bag to the leg with tape or a leg strap. Avoid attaching the new bag too tightly. 7. Wash your hands well. Cleaning the Drainage Bag 1. Wash your hands with soap and water. 2. Wash the bag in warm, soapy water. 3. Rinse the bag with warm water. 4. Fill the bag with a mixture of white vinegar and water (1 cup vinegar to 1 quart warm water [.2 liter vinegar to 1 liter warm water]). Close the bag and soak it for 30 minutes in the solution. 5. Rinse the bag with warm water. 6. Hang the bag to dry with the pour spout open and hanging downward. 7. Store the clean bag (once it is dry) in a clean plastic bag. 8. Wash your hands well. PREVENT  INFECTION  Wash your hands before and after touching your tube.  Take showers every day. Wash the skin where the tube enters your body. Do not take baths. Replace wet leg straps with dry ones, if this applies.  Do not use powders, sprays, or lotions on the genital area. Only use creams, lotions, or ointments as told by your doctor.  For females, wipe from front to back after going to the bathroom.  Drink enough fluids to keep your pee clear or pale yellow unless you are told not to have too much fluid (fluid restriction).  Do not let the drainage bag or tubing touch or lie on the floor.  Wear cotton underwear to keep the area dry. GET HELP IF:  Your pee is cloudy or smells unusually bad.  Your tube becomes clogged.  You are not draining pee into the bag or your bladder feels  full.  Your tube starts to leak. GET HELP RIGHT AWAY IF:  You have pain, puffiness (swelling), redness, or yellowish-white fluid (pus) where the tube enters the body.  You have pain in the belly (abdomen), legs, lower back, or bladder.  You have a fever.  You see blood fill the tube, or your pee is pink or red.  You feel sick to your stomach (nauseous), throw up (vomit), or have chills.  Your tube gets pulled out. MAKE SURE YOU:   Understand these instructions.  Will watch your condition.  Will get help right away if you are not doing well or get worse. Document Released: 11/03/2012 Document Revised: 11/23/2013 Document Reviewed: 11/03/2012 [facility] Patient Information 2015 [place], [place]. This information is not intended to replace advice given to you by your health care provider. Make sure you discuss any questions you have with your health care provider.

## 2015-04-17 LAB — URINE CULTURE

## 2015-04-18 ENCOUNTER — Other Ambulatory Visit: Payer: Self-pay | Admitting: *Deleted

## 2015-04-18 DIAGNOSIS — N390 Urinary tract infection, site not specified: Principal | ICD-10-CM

## 2015-04-18 DIAGNOSIS — A499 Bacterial infection, unspecified: Secondary | ICD-10-CM

## 2015-04-18 MED ORDER — SULFAMETHOXAZOLE-TRIMETHOPRIM 800-160 MG PO TABS: 1.0000 | ORAL_TABLET | Freq: Two times a day (BID) | ORAL | Status: DC

## 2015-04-18 NOTE — Progress Notes (Signed)
Per Joylene John, NP patient notified that the urine culture collected 04/15/15 shows a UTI. Prescription sent to patient's pharmacy and patient is agreeable to get her husband to pickup the prescription today. No other concerns noted at this time. Patient agreeable to keep appt on 04/21/15 for catheter removal.

## 2015-04-19 ENCOUNTER — Encounter: Payer: Self-pay | Admitting: Gynecologic Oncology

## 2015-04-19 NOTE — Progress Notes (Signed)
Follow Up Note: Gyn-Onc  Pam [name] 74 y.o. female  CC:  Chief Complaint  Patient presents with  . Severe Back Pain, Abd discomfort    Follow up    HPI:  Pam [name] is a 74 year old female initially seen in consultation by Dr. Benjie Karvonen.  She was seen in the office for an annual check up.  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 Her pap smear revealed high-grade squamous intraepithelial lesion with severe dysplasia cannot exclude microinvasive carcinoma. She underwent an ultrasound revealed the endometrial cavity to have some fluid within it. The endometrial walls were noted to be 5.4 mm. There is 1.7 x 0.7 x 1.8 cm area that the appearance of a polyp that there is no vascularity seen. There was a 2.4 x 2.1 x 2.3 cm lower uterine segment fibroid. They could not identify any abnormalities in the adnexa. Because of the endometrial biopsy results and the appearance of the cervix there was a concern for a squamous cell carcinoma the cervix and she was subsequently referred to GYN Oncology.  PET scan on 03/30/15 resulted IMPRESSION: 1. Intense metabolic activity associated uterine body / endometrial canal consistent with primary uterine carcinoma. 2. No hypermetabolic pelvic or abdominal lymph nodes. Subcentimeter iliac lymph nodes do not have associated metabolic activity. 3. Single hypermetabolic precarinal lymph node is unlikely GYN metastasis. This small lymph node has normal morphology and is likely reactive.  On 04/07/15, she underwent a robotic-assisted type III radical laparoscopic hysterectomy with bilateral salpingoophorectomy and SLN mapping with bilateral pelvic sentinel lymph node biopsy by Dr. Everitt Amber.  Her post-operative course was uneventful.  Final pathology revealed:  1. Lymph node, sentinel, biopsy, right mid obturator - ONE BENIGN LYMPH NODE  WITH NO TUMOR SEEN (0/1). - SEE COMMENT UNDER ONCOLOGY TEMPLATE BELOW. 2. Lymph node, sentinel, biopsy, right proximal obturator - ONE BENIGN LYMPH NODE WITH NO TUMOR SEEN (0/1). - SEE COMMENT UNDER ONCOLOGY TEMPLATE BELOW. 3. Lymph node, sentinel, biopsy, right external iliac - ONE BENIGN LYMPH NODE WITH NO TUMOR SEEN (0/1). - SEE COMMENT UNDER ONCOLOGY TEMPLATE BELOW. 4. Lymph node, sentinel, biopsy, left external iliac - ONE BENIGN LYMPH NODE WITH NO TUMOR SEEN (0/1). - SEE COMMENT UNDER ONCOLOGY TEMPLATE BELOW. 5. Uterus +/- tubes/ovaries, neoplastic, with upper vagina - INVASIVE MODERATELY DIFFERENTIATED SQUAMOUS CELL CARCINOMA INVOLVING ENTIRE CERVIX AND EXTENDING INTO ENDOMETRIAL AND MYOMETRIAL TISSUE. - TUMOR MEASURES 4.5 CM IN GREATEST DIMENSION. - TWO BENIGN LEFT PARACERVICAL LYMPH NODES WITH NO TUMOR SEEN (0/2). - PARAMETRIAL SOFT TISSUE AND VAGINAL CUFF MARGIN ARE UNINVOLVED BY TUMOR. - SEE ONCOLOGY TEMPLATE AND COMMENT. ADDITIONAL FINDINGS: - BENIGN BILATERAL OVARIES AND FALLOPIAN TUBES WITH NO TUMOR SEEN. 6. Soft tissue, biopsy, left, true distal parametrial margin - BENIGN FIBROFATTY AND VASCULAR SOFT TISSUE. - BENIGN NERVES PRESENT. - NO TUMOR SEEN. Microscopic Comment 5. UTERINE CERVIX 1 of 4 FINAL for Pam [name], Pam [name] ([id number]) Microscopic Comment(continued) Specimen: Uterus with cervix, vaginal cuff and bilateral ovaries and fallopian tubes. Procedure: Robotic assisted type 3 radical laparoscopic hysterectomy with bilateral salpingo-oophorectomy with bilateral pelvic sentinel lymph node biopsies. Tumor site: Tumor involves entire cervix (all quadrants). Maximum tumor size: 4.5 cm. Histologic type: Squamous cell carcinoma. Grade: Intermediate grade (moderately differentiated). Margins: Negative. Distance of carcinoma from closest margin: 0.5 cm (3 o'clock vaginal cuff margin). Depth of invasion: 11 mm in depth where cervix is 13 mm in  thickness. Horizontal  extent: Greater than 20 mm, see below comment. Lymph vascular invasion: Definitive lymph/vascular invasion is not identified. Vaginal extension: Not identified. Uterine corpus extension: Yes, present, see below comment. Parametrial involvement: No. Lymph nodes: # examined 6; # positive 0. TNM code: pT1b2, pN0, see comment. FIGO Stage (based on pathologic findings, needs clinical correlation): FIGO stage of IB1, see comment. Comments: 1) The tumor involves the entire cervix and invades into the uterus and measures 4.5 cm in greatest dimension. Although the initial FIGO stage is IB1, as the tumor is greater than 4 cm in greatest dimension, it is pathologically staged as a pT1b2. 2) Per Dr. Serita Grit request, the lymph nodes are stained with cytokeratin AE1/AE3 (5 stains total). The stains fail to demonstrate evidence of metastatic squamous cell carcinoma.   Interval History:  Patient presents today alone for evaluation of severe back pain that began radiating to her abdomen.  She states that she began dribbling urine on the car ride home after having her catheter removed in the office yesterday.  She states the lower back discomfort developed later that afternoon and she felt it may be her arthritis.  She states the lower back discomfort worsened throughout the night and she was unable to sleep.  The pain became severe and started radiating to her lower abdomen.  She called the office this am reporting severe pain like she had never had before.  Advised to come into the office as soon as possible to assess post-void residual.  She has continued to dribble urine.  Unsure whether she has been urinating moderate amounts at home since the urine goes in the toilet and mixes with the water.  Her BLE edema has resolved and she has not taken any HCTZ prescribed yesterday.  No other concerns voiced.    Review of Systems: Constitutional: Feels uncomfortable.  No fever, chills, early satiety, weight loss or gain.   Cardiovascular: No chest pain, shortness of breath.  Mild BLE edema improved.  Pulmonary: No cough or wheeze.  Gastrointestinal: No nausea, vomiting, or diarrhea. No bright red blood per rectum or change in bowel movement.  Genitourinary: Dribbling urine.  No vaginal bleeding or discharge.  Musculoskeletal: No myalgia or joint pain. Neurologic: No weakness, numbness, or change in gait.  Psychology: No depression, anxiety, or insomnia.  Current Meds:  Outpatient Encounter Prescriptions as of 04/15/2015  Medication Sig  . Calcium Carbonate (CALCIUM 600 PO) Take 1 tablet by mouth daily.  . Cholecalciferol (VITAMIN D PO) Take 1 capsule by mouth daily.  . diclofenac (VOLTAREN) 75 MG EC tablet Take 75 mg by mouth daily.  . fexofenadine (ALLEGRA) 180 MG tablet Take 180 mg by mouth daily.  . hydrochlorothiazide (HYDRODIURIL) 12.5 MG tablet Take 1 tablet (12.5 mg total) by mouth daily.  Marland Kitchen HYDROcodone-acetaminophen (NORCO/VICODIN) 5-325 MG per tablet Take 1-2 tablets by mouth every 6 (six) hours as needed for moderate pain.   No facility-administered encounter medications on file as of 04/15/2015.    Allergy:  Allergies  Allergen Reactions  . Aspirin Nausea And Vomiting  . Ciprofloxacin Diarrhea  . Flagyl [Metronidazole] Diarrhea  . Penicillins Hives    Social Hx:   Social History   Social History  . Marital Status: Married    Spouse Name: N/A  . Number of Children: N/A  . Years of Education: N/A   Occupational History  . Not on file.   Social History Main Topics  . Smoking status: Former Smoker -- 30 years  Types: Cigarettes    Quit date: 07/23/1994  . Smokeless tobacco: Not on file  . Alcohol Use: No  . Drug Use: No  . Sexual Activity: Not on file   Other Topics Concern  . Not on file   Social History Narrative    Past Surgical Hx:  Past Surgical History  Procedure Laterality Date  . Mastectomy Left 2006  . Knee arthroscopy Left   . Tubal ligation    . Robotic  assisted total hysterectomy with bilateral salpingo oopherectomy Bilateral 04/07/2015    Procedure: ROBOTIC ASSISTED RADICAL HYSTERECTOMY BILATERAL SALPINGO OOPHORECTOMY BILATERAL SENTINEL LYMPHADENETOMY;  Surgeon: Everitt Amber, MD;  Location: [facility];  Service: Gynecology;  Laterality: Bilateral;    Past Medical Hx:  Past Medical History  Diagnosis Date  . Arthritis   . History of breast cancer 2006    Dr. Sonny Dandy oncologist   . Hyperlipidemia   . History of gout   . Cancer     BREAST CANCER / CERVICAL CANCER     Family Hx:  Family History  Problem Relation Age of Onset  . Prostate cancer Father   . Heart disease Father   . Breast cancer Sister   . Heart disease Mother     Vitals:  Blood pressure 142/79, pulse 99, temperature 98.5 F (36.9 C), resp. rate 18.  Physical Exam:  General: Well developed, well nourished female appearing uncomfortable. Alert and oriented x 3.  Abdomen: Abdomen slightly firm and distended.  Active bowel sounds in all quadrants. No evidence of a fluid wave or abdominal masses.  Extremities: Mild 1+pitting edema of BLE resolved.  No bilateral cyanosis or clubbing.  Patient went to the bathroom to attempt to void.  Less than 25 cc out.  13F Foley catheter inserted in the bladder without difficulty following sterile procedures.  Patient expressing immediate relief.  800 cc of slightly cloudy, light yellow out in the foley.    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  Foley catheter replaced due to urinary retention.  She is to come back to the office in one week for trial voiding with foley removal at that time.  Reportable signs and symptoms reviewed.  Her pain has been relieved.  Urine sent for analysis and culture due to cloudiness.  She is continue with her post-operative restrictions.  Since her edema has resolved, she is advised to discard HCTZ tablets.  She will call  the office with an update and is advised to call for any questions or concerns.    CROSS, MELISSA DEAL, NP 04/19/2015, 4:45 PM

## 2015-04-19 NOTE — Progress Notes (Signed)
Follow Up Note: Gyn-Onc  Pam [name] 74 y.o. female  CC:  Chief Complaint  Patient presents with  . Foley removal    Post-op, foley removal    HPI:  Pam [name] is a 74 year old female initially seen in consultation by Dr. Benjie Karvonen.  She was seen in the office for an annual check up.  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 Her pap smear revealed high-grade squamous intraepithelial lesion with severe dysplasia cannot exclude microinvasive carcinoma. She underwent an ultrasound revealed the endometrial cavity to have some fluid within it. The endometrial walls were noted to be 5.4 mm. There is 1.7 x 0.7 x 1.8 cm area that the appearance of a polyp that there is no vascularity seen. There was a 2.4 x 2.1 x 2.3 cm lower uterine segment fibroid. They could not identify any abnormalities in the adnexa. Because of the endometrial biopsy results and the appearance of the cervix there was a concern for a squamous cell carcinoma the cervix and she was subsequently referred to GYN Oncology.  PET scan on 03/30/15 resulted IMPRESSION: 1. Intense metabolic activity associated uterine body / endometrial canal consistent with primary uterine carcinoma. 2. No hypermetabolic pelvic or abdominal lymph nodes. Subcentimeter iliac lymph nodes do not have associated metabolic activity. 3. Single hypermetabolic precarinal lymph node is unlikely GYN metastasis. This small lymph node has normal morphology and is likely reactive.  On 04/07/15, she underwent a robotic-assisted type III radical laparoscopic hysterectomy with bilateral salpingoophorectomy and SLN mapping with bilateral pelvic sentinel lymph node biopsy by Dr. Everitt Amber.  Her post-operative course was uneventful.  Final pathology revealed:  1. Lymph node, sentinel, biopsy, right mid obturator - ONE BENIGN LYMPH NODE WITH NO  TUMOR SEEN (0/1). - SEE COMMENT UNDER ONCOLOGY TEMPLATE BELOW. 2. Lymph node, sentinel, biopsy, right proximal obturator - ONE BENIGN LYMPH NODE WITH NO TUMOR SEEN (0/1). - SEE COMMENT UNDER ONCOLOGY TEMPLATE BELOW. 3. Lymph node, sentinel, biopsy, right external iliac - ONE BENIGN LYMPH NODE WITH NO TUMOR SEEN (0/1). - SEE COMMENT UNDER ONCOLOGY TEMPLATE BELOW. 4. Lymph node, sentinel, biopsy, left external iliac - ONE BENIGN LYMPH NODE WITH NO TUMOR SEEN (0/1). - SEE COMMENT UNDER ONCOLOGY TEMPLATE BELOW. 5. Uterus +/- tubes/ovaries, neoplastic, with upper vagina - INVASIVE MODERATELY DIFFERENTIATED SQUAMOUS CELL CARCINOMA INVOLVING ENTIRE CERVIX AND EXTENDING INTO ENDOMETRIAL AND MYOMETRIAL TISSUE. - TUMOR MEASURES 4.5 CM IN GREATEST DIMENSION. - TWO BENIGN LEFT PARACERVICAL LYMPH NODES WITH NO TUMOR SEEN (0/2). - PARAMETRIAL SOFT TISSUE AND VAGINAL CUFF MARGIN ARE UNINVOLVED BY TUMOR. - SEE ONCOLOGY TEMPLATE AND COMMENT. ADDITIONAL FINDINGS: - BENIGN BILATERAL OVARIES AND FALLOPIAN TUBES WITH NO TUMOR SEEN. 6. Soft tissue, biopsy, left, true distal parametrial margin - BENIGN FIBROFATTY AND VASCULAR SOFT TISSUE. - BENIGN NERVES PRESENT. - NO TUMOR SEEN. Microscopic Comment 5. UTERINE CERVIX 1 of 4 FINAL for Pam [name], Pam [name] ([id number]) Microscopic Comment(continued) Specimen: Uterus with cervix, vaginal cuff and bilateral ovaries and fallopian tubes. Procedure: Robotic assisted type 3 radical laparoscopic hysterectomy with bilateral salpingo-oophorectomy with bilateral pelvic sentinel lymph node biopsies. Tumor site: Tumor involves entire cervix (all quadrants). Maximum tumor size: 4.5 cm. Histologic type: Squamous cell carcinoma. Grade: Intermediate grade (moderately differentiated). Margins: Negative. Distance of carcinoma from closest margin: 0.5 cm (3 o'clock vaginal cuff margin). Depth of invasion: 11 mm in depth where cervix is 13 mm in thickness. Horizontal  extent:  Greater than 20 mm, see below comment. Lymph vascular invasion: Definitive lymph/vascular invasion is not identified. Vaginal extension: Not identified. Uterine corpus extension: Yes, present, see below comment. Parametrial involvement: No. Lymph nodes: # examined 6; # positive 0. TNM code: pT1b2, pN0, see comment. FIGO Stage (based on pathologic findings, needs clinical correlation): FIGO stage of IB1, see comment. Comments: 1) The tumor involves the entire cervix and invades into the uterus and measures 4.5 cm in greatest dimension. Although the initial FIGO stage is IB1, as the tumor is greater than 4 cm in greatest dimension, it is pathologically staged as a pT1b2. 2) Per Dr. Serita Grit request, the lymph nodes are stained with cytokeratin AE1/AE3 (5 stains total). The stains fail to demonstrate evidence of metastatic squamous cell carcinoma.   Interval History:  Patient presents today with her daughter for post-operative follow up and foley removal.  She reports doing well since surgery.  Tolerating diet with no nausea or emesis.  Ambulating without difficulty.  Minimal pain reported.  She reports bilateral lower extremity edema since surgery.  Bowels functioning without difficulty and foley draining clear, yellow urine.  Denies fever, chills.  See below for more detailed review of systems.   Review of Systems: Constitutional: Feels well.  No fever, chills, early satiety, weight loss or gain.  Cardiovascular: No chest pain, shortness of breath.  Mild BLE edema.  Pulmonary: No cough or wheeze.  Gastrointestinal: No nausea, vomiting, or diarrhea. No bright red blood per rectum or change in bowel movement.  Genitourinary: No frequency, urgency, or dysuria. No vaginal bleeding or discharge.  Musculoskeletal: No myalgia or joint pain. Neurologic: No weakness, numbness, or change in gait.  Psychology: No depression, anxiety, or insomnia.  Current Meds:  Outpatient Encounter Prescriptions as  of 04/14/2015  Medication Sig  . Calcium Carbonate (CALCIUM 600 PO) Take 1 tablet by mouth daily.  . Cholecalciferol (VITAMIN D PO) Take 1 capsule by mouth daily.  . diclofenac (VOLTAREN) 75 MG EC tablet Take 75 mg by mouth daily.  . fexofenadine (ALLEGRA) 180 MG tablet Take 180 mg by mouth daily.  . hydrochlorothiazide (HYDRODIURIL) 12.5 MG tablet Take 1 tablet (12.5 mg total) by mouth daily.  Marland Kitchen HYDROcodone-acetaminophen (NORCO/VICODIN) 5-325 MG per tablet Take 1-2 tablets by mouth every 6 (six) hours as needed for moderate pain.   No facility-administered encounter medications on file as of 04/14/2015.    Allergy:  Allergies  Allergen Reactions  . Aspirin Nausea And Vomiting  . Ciprofloxacin Diarrhea  . Flagyl [Metronidazole] Diarrhea  . Penicillins Hives    Social Hx:   Social History   Social History  . Marital Status: Married    Spouse Name: N/A  . Number of Children: N/A  . Years of Education: N/A   Occupational History  . Not on file.   Social History Main Topics  . Smoking status: Former Smoker -- 30 years    Types: Cigarettes    Quit date: 07/23/1994  . Smokeless tobacco: Not on file  . Alcohol Use: No  . Drug Use: No  . Sexual Activity: Not on file   Other Topics Concern  . Not on file   Social History Narrative    Past Surgical Hx:  Past Surgical History  Procedure Laterality Date  . Mastectomy Left 2006  . Knee arthroscopy Left   . Tubal ligation    . Robotic assisted total hysterectomy with bilateral salpingo oopherectomy Bilateral 04/07/2015    Procedure: ROBOTIC ASSISTED RADICAL HYSTERECTOMY  BILATERAL SALPINGO OOPHORECTOMY BILATERAL SENTINEL LYMPHADENETOMY;  Surgeon: Everitt Amber, MD;  Location: [facility];  Service: Gynecology;  Laterality: Bilateral;    Past Medical Hx:  Past Medical History  Diagnosis Date  . Arthritis   . History of breast cancer 2006    Dr. Sonny Dandy oncologist   . Hyperlipidemia   . History of gout   . Cancer     BREAST CANCER  / CERVICAL CANCER     Family Hx:  Family History  Problem Relation Age of Onset  . Prostate cancer Father   . Heart disease Father   . Breast cancer Sister   . Heart disease Mother     Vitals:  Blood pressure 140/79, pulse 89, temperature 97.7 F (36.5 C), temperature source Oral, resp. rate 18, height 5\' 3"  (1.6 m), weight 129 lb 14.4 oz (58.922 kg).  Physical Exam:  General: Well developed, well nourished female in no acute distress. Alert and oriented x 3.  Neck: Supple without any enlargements.  Lymph node survey: No cervical, supraclavicular, or inguinal adenopathy.  Cardiovascular: Regular rate and rhythm. S1 and S2 normal.  Lungs: Clear to auscultation bilaterally. No wheezes/crackles/rhonchi noted.  Skin: No rashes or lesions present. Back: No CVA tenderness.  Abdomen: Abdomen soft, non-tender and non-obese. Active bowel sounds in all quadrants. No evidence of a fluid wave or abdominal masses.  Extremities: Mild 1+pitting edema of BLE.  No bilateral cyanosis or clubbing.  250 cc of sterile normal saline instilled in the bladder without difficulty per Dr. Denman George.  Foley removed without difficulty.  Patient tolerated procedure well.  Patient voided 80 cc with first void, 50 cc with the next, and 150 with the last.  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  She is doing well post-operatively.  Recommendations for radiation therapy 6 weeks post-operatively discussed per Dr. Denman George.  Reportable signs and symptoms reviewed.  She is to follow up as scheduled or sooner if needed.  She is to call for dysuria, difficulty urinating, fever, chills.  She is continue with her post-operative restrictions.  She is prescribed hydrochlorothiazide 12.5 to take for a maximum of five days once daily to decrease the pitting edema in the lower extremities.  She will call the office with an update and is advised to  call for any questions or concerns.    CROSS, MELISSA DEAL, NP 04/19/2015, 3:28 PM

## 2015-04-21 ENCOUNTER — Encounter: Payer: Self-pay | Admitting: Gynecologic Oncology

## 2015-04-21 ENCOUNTER — Ambulatory Visit: Payer: Medicare Other | Attending: Gynecologic Oncology | Admitting: Gynecologic Oncology

## 2015-04-21 VITALS — BP 140/84 | HR 102 | Temp 97.9°F | Resp 18 | Wt 125.3 lb

## 2015-04-21 DIAGNOSIS — C539 Malignant neoplasm of cervix uteri, unspecified: Secondary | ICD-10-CM | POA: Diagnosis not present

## 2015-04-21 DIAGNOSIS — Z466 Encounter for fitting and adjustment of urinary device: Secondary | ICD-10-CM

## 2015-04-21 NOTE — Progress Notes (Signed)
Follow Up Note: Gyn-Onc  [name] 74 y.o. female  CC:  Chief Complaint  Patient presents with  . Foley Removal/Trial Void    Follow up post-op    HPI:  Pam [name] is a 74 year old female initially seen in consultation by Dr. Benjie Karvonen.  She was seen in the office for an annual check up.  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 Her pap smear revealed high-grade squamous intraepithelial lesion with severe dysplasia cannot exclude microinvasive carcinoma. She underwent an ultrasound revealed the endometrial cavity to have some fluid within it. The endometrial walls were noted to be 5.4 mm. There is 1.7 x 0.7 x 1.8 cm area that the appearance of a polyp that there is no vascularity seen. There was a 2.4 x 2.1 x 2.3 cm lower uterine segment fibroid. They could not identify any abnormalities in the adnexa. Because of the endometrial biopsy results and the appearance of the cervix there was a concern for a squamous cell carcinoma the cervix and she was subsequently referred to GYN Oncology.  PET scan on 03/30/15 resulted IMPRESSION: 1. Intense metabolic activity associated uterine body / endometrial canal consistent with primary uterine carcinoma. 2. No hypermetabolic pelvic or abdominal lymph nodes. Subcentimeter iliac lymph nodes do not have associated metabolic activity. 3. Single hypermetabolic precarinal lymph node is unlikely GYN metastasis. This small lymph node has normal morphology and is likely reactive.  On 04/07/15, she underwent a robotic-assisted type III radical laparoscopic hysterectomy with bilateral salpingoophorectomy and SLN mapping with bilateral pelvic sentinel lymph node biopsy by Dr. Everitt Amber.  Her post-operative course was uneventful.  Final pathology revealed:  1. Lymph node, sentinel, biopsy, right mid obturator - ONE BENIGN LYMPH NODE  WITH NO TUMOR SEEN (0/1). - SEE COMMENT UNDER ONCOLOGY TEMPLATE BELOW. 2. Lymph node, sentinel, biopsy, right proximal obturator - ONE BENIGN LYMPH NODE WITH NO TUMOR SEEN (0/1). - SEE COMMENT UNDER ONCOLOGY TEMPLATE BELOW. 3. Lymph node, sentinel, biopsy, right external iliac - ONE BENIGN LYMPH NODE WITH NO TUMOR SEEN (0/1). - SEE COMMENT UNDER ONCOLOGY TEMPLATE BELOW. 4. Lymph node, sentinel, biopsy, left external iliac - ONE BENIGN LYMPH NODE WITH NO TUMOR SEEN (0/1). - SEE COMMENT UNDER ONCOLOGY TEMPLATE BELOW. 5. Uterus +/- tubes/ovaries, neoplastic, with upper vagina - INVASIVE MODERATELY DIFFERENTIATED SQUAMOUS CELL CARCINOMA INVOLVING ENTIRE CERVIX AND EXTENDING INTO ENDOMETRIAL AND MYOMETRIAL TISSUE. - TUMOR MEASURES 4.5 CM IN GREATEST DIMENSION. - TWO BENIGN LEFT PARACERVICAL LYMPH NODES WITH NO TUMOR SEEN (0/2). - PARAMETRIAL SOFT TISSUE AND VAGINAL CUFF MARGIN ARE UNINVOLVED BY TUMOR. - SEE ONCOLOGY TEMPLATE AND COMMENT. ADDITIONAL FINDINGS: - BENIGN BILATERAL OVARIES AND FALLOPIAN TUBES WITH NO TUMOR SEEN. 6. Soft tissue, biopsy, left, true distal parametrial margin - BENIGN FIBROFATTY AND VASCULAR SOFT TISSUE. - BENIGN NERVES PRESENT. - NO TUMOR SEEN. Microscopic Comment 5. UTERINE CERVIX 1 of 4 FINAL for AZIZI, BALLY ([id number]) Microscopic Comment(continued) Specimen: Uterus with cervix, vaginal cuff and bilateral ovaries and fallopian tubes. Procedure: Robotic assisted type 3 radical laparoscopic hysterectomy with bilateral salpingo-oophorectomy with bilateral pelvic sentinel lymph node biopsies. Tumor site: Tumor involves entire cervix (all quadrants). Maximum tumor size: 4.5 cm. Histologic type: Squamous cell carcinoma. Grade: Intermediate grade (moderately differentiated). Margins: Negative. Distance of carcinoma from closest margin: 0.5 cm (3 o'clock vaginal cuff margin). Depth of invasion: 11 mm in depth where cervix is 13 mm in thickness.  Horizontal  extent: Greater than 20 mm, see below comment. Lymph vascular invasion: Definitive lymph/vascular invasion is not identified. Vaginal extension: Not identified. Uterine corpus extension: Yes, present, see below comment. Parametrial involvement: No. Lymph nodes: # examined 6; # positive 0. TNM code: pT1b2, pN0, see comment. FIGO Stage (based on pathologic findings, needs clinical correlation): FIGO stage of IB1, see comment. Comments: 1) The tumor involves the entire cervix and invades into the uterus and measures 4.5 cm in greatest dimension. Although the initial FIGO stage is IB1, as the tumor is greater than 4 cm in greatest dimension, it is pathologically staged as a pT1b2. 2) Per Dr. Serita Grit request, the lymph nodes are stained with cytokeratin AE1/AE3 (5 stains total). The stains fail to demonstrate evidence of metastatic squamous cell carcinoma.   Interval History:  Patient presents today for foley removal.  Her foley was replaced on 04/15/15 due to urinary retention s/p removal the day before.  No other episodes of severe back pain when the urinary retention was taking place.  She continues to report doing well since surgery and is ready to have her foley removed.  Tolerating diet with no nausea or emesis.  Ambulating without difficulty.  Minimal pain reported.  She reports bilateral lower extremity edema since surgery has resolved and she did not have to take the HCTZ tablets.  Bowels functioning without difficulty and foley draining clear, yellow urine.  Denies fever, chills.  See below for more detailed review of systems.   Review of Systems: Constitutional: Feels well.  No fever, chills, early satiety, weight loss or gain.  Cardiovascular: No chest pain, shortness of breath.  Mild BLE edema resolved.  Pulmonary: No cough or wheeze.  Gastrointestinal: No nausea, vomiting, or diarrhea. No bright red blood per rectum or change in bowel movement.  Genitourinary: No frequency, urgency, or  dysuria. No vaginal bleeding or discharge.  Musculoskeletal: No myalgia or joint pain. Neurologic: No weakness, numbness, or change in gait.  Psychology: No depression, anxiety, or insomnia.  Current Meds:  Outpatient Encounter Prescriptions as of 04/21/2015  Medication Sig  . Calcium Carbonate (CALCIUM 600 PO) Take 1 tablet by mouth daily.  . Cholecalciferol (VITAMIN D PO) Take 1 capsule by mouth daily.  . diclofenac (VOLTAREN) 75 MG EC tablet Take 75 mg by mouth daily.  . fexofenadine (ALLEGRA) 180 MG tablet Take 180 mg by mouth daily.  . hydrochlorothiazide (HYDRODIURIL) 12.5 MG tablet Take 1 tablet (12.5 mg total) by mouth daily.  Marland Kitchen HYDROcodone-acetaminophen (NORCO/VICODIN) 5-325 MG per tablet Take 1-2 tablets by mouth every 6 (six) hours as needed for moderate pain.  Marland Kitchen sulfamethoxazole-trimethoprim (BACTRIM DS,SEPTRA DS) 800-160 MG per tablet Take 1 tablet by mouth 2 (two) times daily.   No facility-administered encounter medications on file as of 04/21/2015.    Allergy:  Allergies  Allergen Reactions  . Aspirin Nausea And Vomiting  . Ciprofloxacin Diarrhea  . Flagyl [Metronidazole] Diarrhea  . Penicillins Hives    Social Hx:   Social History   Social History  . Marital Status: Married    Spouse Name: N/A  . Number of Children: N/A  . Years of Education: N/A   Occupational History  . Not on file.   Social History Main Topics  . Smoking status: Former Smoker -- 30 years    Types: Cigarettes    Quit date: 07/23/1994  . Smokeless tobacco: Not on file  . Alcohol Use: No  . Drug Use: No  . Sexual Activity: Not on  file   Other Topics Concern  . Not on file   Social History Narrative    Past Surgical Hx:  Past Surgical History  Procedure Laterality Date  . Mastectomy Left 2006  . Knee arthroscopy Left   . Tubal ligation    . Robotic assisted total hysterectomy with bilateral salpingo oopherectomy Bilateral 04/07/2015    Procedure: ROBOTIC ASSISTED RADICAL  HYSTERECTOMY BILATERAL SALPINGO OOPHORECTOMY BILATERAL SENTINEL LYMPHADENETOMY;  Surgeon: Everitt Amber, MD;  Location: [facility];  Service: Gynecology;  Laterality: Bilateral;    Past Medical Hx:  Past Medical History  Diagnosis Date  . Arthritis   . History of breast cancer 2006    Dr. Sonny Dandy oncologist   . Hyperlipidemia   . History of gout   . Cancer     BREAST CANCER / CERVICAL CANCER     Family Hx:  Family History  Problem Relation Age of Onset  . Prostate cancer Father   . Heart disease Father   . Breast cancer Sister   . Heart disease Mother     Vitals:  Blood pressure 140/84, pulse 102, temperature 97.9 F (36.6 C), temperature source Oral, resp. rate 18, weight 125 lb 4.8 oz (56.836 kg), SpO2 100 %.  Physical Exam:  General: Well developed, well nourished female in no acute distress. Alert and oriented x 3.  Skin: No rashes or lesions present. Back: No CVA tenderness.  Abdomen: Abdomen soft, non-tender and non-obese. Active bowel sounds in all quadrants. No evidence of a fluid wave or abdominal masses.  Extremities: No bilateral cyanosis, edema, or clubbing.  240 cc of sterile normal saline instilled in the bladder without difficulty per Dr. Denman George.  Foley removed without difficulty.  Patient tolerated procedure well.  Patient voided 30 cc with first void, 150 cc with the next, and 75 cc with the last.  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  She was voided three times without difficulty since foley removal.  Discussed with Dr. Denman George.  Patient is advised to go to the bathroom every two hours and attempt to void even if there is no sensation to prevent overdistention.  She is to call the office if the back pain develops, difficulty urinating, chills, fever, dysuria, etc.  She is going to measure her urine at home and call the office in the am with an update.  Reportable signs and symptoms  reviewed including when to seek care at the Emergency Room.  She is to follow up as scheduled or sooner if needed.  She is continue with her post-operative restrictions and continue taking the prescribed antibiotic for UTI.   She will call the office with an update and is advised to call for any questions or concerns.    CROSS, MELISSA DEAL, NP 04/21/2015, 4:30 PM

## 2015-04-21 NOTE — Patient Instructions (Signed)
Plan to go to the bathroom to urinate every two hours since you may not have total sensation of your bladder at this time.  You voided adequate amounts today so that is reassuring.  If you begin to dripple or get the back pain you experienced last week, go to the emergency room if after hours or come to the office tomorrow for assessment.  Continue taking antibiotic.  Please call the office with an update.

## 2015-04-22 ENCOUNTER — Telehealth: Payer: Self-pay | Admitting: *Deleted

## 2015-04-22 NOTE — Telephone Encounter (Signed)
Called to check on patient after foley catheter was removed yesterday. Per patient, she is voiding without any problems - states she had to stop twice on the way home from the cancer center yesterday to void and has been voiding normally this morning. She states she is not "dribbling urine" like she was the last time the catheter was removed and denies any pain or discomfort this morning. Told patient that if she has any problems over the weekend to report to the emergency room - patient agreeable to this and denies any other concerns at this time.

## 2015-04-25 ENCOUNTER — Ambulatory Visit: Payer: Medicare Other | Attending: Gynecologic Oncology | Admitting: Gynecologic Oncology

## 2015-04-25 ENCOUNTER — Telehealth: Payer: Self-pay | Admitting: *Deleted

## 2015-04-25 VITALS — BP 126/60 | HR 80 | Temp 98.0°F | Resp 18 | Ht 63.0 in

## 2015-04-25 DIAGNOSIS — N3943 Post-void dribbling: Secondary | ICD-10-CM | POA: Diagnosis not present

## 2015-04-25 NOTE — Telephone Encounter (Signed)
Received phone call from patient stating that she is leaking urine at bedtime since Friday night. She states during the day she feels like she is emptying her bladder and is not experiencing any leakage. Discussed with Dr. Denman George and patient scheduled for appt with Joylene John, NP this afternoon at 2:30pm. Pt will be instructed how to perform self cath at home. Returned call to patient and she is agreeable to come in today for scheduled appt.

## 2015-04-25 NOTE — Patient Instructions (Addendum)
In and out cath tonight before you go to bed.  In the am, after your urinate when you wake up, cath at that time.  In the early evening, empty your bladder then use the catheter.  If you are getting high volumes (more than 300), then add a middle of the day cath.    Clean Intermittent Catheterization Clean intermittent catheterization (CIC) refers to emptying urine from the bladder with a small, flexible tube (catheter). The bladder is an organ in the body that stores urine. Urine may need to be drained from your bladder with a catheter if:  There is an obstruction to the flow of urine out of the bladder or through the urinary tract.  The bladder muscles or nerves are not functioning properly to allow normal flow of urine out of the bladder. Emptying the bladder regularly will help prevent further permanent bladder or kidney damage. SUPPLIES FOR CIC You will need:  The specific type and size of catheter as directed by your caregiver.  Water-soluble, lubricating jelly (if the catheter is not pre-lubricated). Do not use an oil-based lubricant.  A warm, soapy washcloth or pre-moistened wipes.  A container to collect the urine (if you are not using the toilet).  A container or bag to store the catheter. HOW TO PERFORM CIC Follow these steps for a clean technique: 1. Collect supplies and place them within your reach. 2. Wash your hands thoroughly with soap and water. 3. Get in a comfortable position. Positions include:  Sitting forward-facing or backward-facing on a toilet, wheelchair, chair, or edge of bed. It may be helpful to sit forward-facing with a mirror on a stool positioned to help you view the opening of the urethra. Or sit backward-facing on a toilet with a mirror positioned between the toilet lid and toilet seat to help you view the opening of the urethra.  Standing beside the toilet with one foot on the toilet rim.  Lying down with your head raised on pillows. 4. Position the  collection container between your legs (if you are not using the toilet). 5. Urinate (if you are able). 6. Place water-soluble, lubricating jelly on about 2 inches (5 cm) of the tip of the catheter (if the catheter is not pre-lubricated). 7. Set the catheter down on a clean, dry surface within reach. 8. Spread the labia folds open. 9. Wash the labia with the warm, soapy washcloth or pre-moistened wipes. Wash from front to back. 10. Hold the labia open with one hand. 11. Relax. 12. Insert the catheter gently into the urethra opening in an upward and backward direction until urine starts to flow, usually 2 to 3 inches (5 to 8 cm). 13. When urine starts to flow, insert the catheter about 1 inch (3 cm) more. 14. When the urine stops flowing, strain or gently push on the lower abdominal muscles to help the bladder drain fully. 15. Gently pull the catheter out. 16. Wash the labia and genital area. 17. Report any changes in the urine to your caregiver. 18. Discard the urine. 19. If you are using a multiple use catheter, wash the catheter as directed by your caregiver. Rinse. Allow to air dry. Store the catheter in a clean, dry container or bag. 20. Wash your hands. HOME CARE INSTRUCTIONS  Drink 6 to 8 glasses of fluid each day. Avoid caffeine. Caffeine may make you have to urinate more frequently and more urgently.  Empty your bladder every 4 to 6 hours or as directed by your  caregiver.  Perform a CIC if you have symptoms of too much urine in your bladder (overdistension), and you are not able to urinate. Symptoms of overdistension include:  Restlessness.  Sweating or chills.  Headache.  Flushed or pale color.  Cold limbs.  Bloated lower abdomen.  Discard a multiple use catheter when it becomes dry, brittle, or cloudy (usually after 1 week of use).  Take all medications as directed by your caregiver. SEEK MEDICAL CARE IF:  You are having trouble performing any of the steps.  You  are leaking urine.  You have pain when you urinate.  You notice blood in your urine.  You feel the need to empty your bladder (void) often.  Your urine is cloudy or smells different.  You have pain in your abdomen.  You develop a rash or sores. SEEK IMMEDIATE MEDICAL CARE IF:  You have a fever or persistent symptoms for more than 72 hours.  You have a fever and your symptoms suddenly get worse.  Your pain becomes severe. Document Released: 08/11/2010 Document Revised: 11/23/2013 Document Reviewed: 08/11/2010 [facility] Patient Information 2015 [place], [place]. This information is not intended to replace advice given to you by your health care provider. Make sure you discuss any questions you have with your health care provider.

## 2015-04-26 ENCOUNTER — Telehealth: Payer: Self-pay

## 2015-04-26 ENCOUNTER — Encounter: Payer: Self-pay | Admitting: Gynecologic Oncology

## 2015-04-26 NOTE — Progress Notes (Signed)
Follow Up Note: Gyn-Onc  Pam [name] 74 y.o. female  CC:  Chief Complaint  Patient presents with  . Urinary Incontinence    Follow up post-op    HPI:  Pam [name] is a 74 year old female initially seen in consultation by Dr. Benjie Karvonen.  She was seen in the office for an annual check up.  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 Her pap smear revealed high-grade squamous intraepithelial lesion with severe dysplasia cannot exclude microinvasive carcinoma. She underwent an ultrasound revealed the endometrial cavity to have some fluid within it. The endometrial walls were noted to be 5.4 mm. There is 1.7 x 0.7 x 1.8 cm area that the appearance of a polyp that there is no vascularity seen. There was a 2.4 x 2.1 x 2.3 cm lower uterine segment fibroid. They could not identify any abnormalities in the adnexa. Because of the endometrial biopsy results and the appearance of the cervix there was a concern for a squamous cell carcinoma the cervix and she was subsequently referred to GYN Oncology.  PET scan on 03/30/15 resulted IMPRESSION: 1. Intense metabolic activity associated uterine body / endometrial canal consistent with primary uterine carcinoma. 2. No hypermetabolic pelvic or abdominal lymph nodes. Subcentimeter iliac lymph nodes do not have associated metabolic activity. 3. Single hypermetabolic precarinal lymph node is unlikely GYN metastasis. This small lymph node has normal morphology and is likely reactive.  On 04/07/15, she underwent a robotic-assisted type III radical laparoscopic hysterectomy with bilateral salpingoophorectomy and SLN mapping with bilateral pelvic sentinel lymph node biopsy by Dr. Everitt Amber.  Her post-operative course was uneventful.  Final pathology revealed:  1. Lymph node, sentinel, biopsy, right mid obturator - ONE BENIGN LYMPH NODE WITH  NO TUMOR SEEN (0/1). - SEE COMMENT UNDER ONCOLOGY TEMPLATE BELOW. 2. Lymph node, sentinel, biopsy, right proximal obturator - ONE BENIGN LYMPH NODE WITH NO TUMOR SEEN (0/1). - SEE COMMENT UNDER ONCOLOGY TEMPLATE BELOW. 3. Lymph node, sentinel, biopsy, right external iliac - ONE BENIGN LYMPH NODE WITH NO TUMOR SEEN (0/1). - SEE COMMENT UNDER ONCOLOGY TEMPLATE BELOW. 4. Lymph node, sentinel, biopsy, left external iliac - ONE BENIGN LYMPH NODE WITH NO TUMOR SEEN (0/1). - SEE COMMENT UNDER ONCOLOGY TEMPLATE BELOW. 5. Uterus +/- tubes/ovaries, neoplastic, with upper vagina - INVASIVE MODERATELY DIFFERENTIATED SQUAMOUS CELL CARCINOMA INVOLVING ENTIRE CERVIX AND EXTENDING INTO ENDOMETRIAL AND MYOMETRIAL TISSUE. - TUMOR MEASURES 4.5 CM IN GREATEST DIMENSION. - TWO BENIGN LEFT PARACERVICAL LYMPH NODES WITH NO TUMOR SEEN (0/2). - PARAMETRIAL SOFT TISSUE AND VAGINAL CUFF MARGIN ARE UNINVOLVED BY TUMOR. - SEE ONCOLOGY TEMPLATE AND COMMENT. ADDITIONAL FINDINGS: - BENIGN BILATERAL OVARIES AND FALLOPIAN TUBES WITH NO TUMOR SEEN. 6. Soft tissue, biopsy, left, true distal parametrial margin - BENIGN FIBROFATTY AND VASCULAR SOFT TISSUE. - BENIGN NERVES PRESENT. - NO TUMOR SEEN. Microscopic Comment 5. UTERINE CERVIX 1 of 4 FINAL for DEBORAHANN, POTEAT ([id number]) Microscopic Comment(continued) Specimen: Uterus with cervix, vaginal cuff and bilateral ovaries and fallopian tubes. Procedure: Robotic assisted type 3 radical laparoscopic hysterectomy with bilateral salpingo-oophorectomy with bilateral pelvic sentinel lymph node biopsies. Tumor site: Tumor involves entire cervix (all quadrants). Maximum tumor size: 4.5 cm. Histologic type: Squamous cell carcinoma. Grade: Intermediate grade (moderately differentiated). Margins: Negative. Distance of carcinoma from closest margin: 0.5 cm (3 o'clock vaginal cuff margin). Depth of invasion: 11 mm in depth where cervix is 13 mm in thickness. Horizontal  extent: Greater than 20 mm, see below comment. Lymph vascular invasion: Definitive lymph/vascular invasion is not identified. Vaginal extension: Not identified. Uterine corpus extension: Yes, present, see below comment. Parametrial involvement: No. Lymph nodes: # examined 6; # positive 0. TNM code: pT1b2, pN0, see comment. FIGO Stage (based on pathologic findings, needs clinical correlation): FIGO stage of IB1, see comment. Comments: 1) The tumor involves the entire cervix and invades into the uterus and measures 4.5 cm in greatest dimension. Although the initial FIGO stage is IB1, as the tumor is greater than 4 cm in greatest dimension, it is pathologically staged as a pT1b2. 2) Per Dr. Serita Grit request, the lymph nodes are stained with cytokeratin AE1/AE3 (5 stains total). The stains fail to demonstrate evidence of metastatic squamous cell carcinoma.   She experienced urinary retention post-operatively after having her foley catheter removed one week post-op.  The foley had to be replaced and she went another week with bladder rest.  Her foley was removed on Sept 29, 2016 and she reported voiding without difficulty on Sept. 30, 2016.     Interval History:  Patient presents today alone for evaluation of nocturnal urine leakage.  She states she had been voiding throughout the day with no dribbling of urine.  Each void was around 150 cc.  She states when she lays down at night time, a moderate amount of urine leaks out without her control.  She denies having abdominal discomfort or severe back pain like the symptoms she experienced when she had urinary retention.  She has been unable to sleep due to the incontinence.  No other concerns voiced.       Review of Systems: Constitutional: Feels well.  No fever, chills, early satiety, weight loss or gain.  Cardiovascular: No chest pain, shortness of breath.  Mild BLE edema improved.  Pulmonary: No cough or wheeze.  Gastrointestinal: No nausea, vomiting,  or diarrhea. No bright red blood per rectum or change in bowel movement.  Genitourinary: Dribbling urine at nighttime.  No vaginal bleeding or discharge.  Musculoskeletal: No myalgia or joint pain. Neurologic: No weakness, numbness, or change in gait.  Psychology: No depression, anxiety, or insomnia.  Current Meds:  Outpatient Encounter Prescriptions as of 04/25/2015  Medication Sig  . Calcium Carbonate (CALCIUM 600 PO) Take 1 tablet by mouth daily.  . Cholecalciferol (VITAMIN D PO) Take 1 capsule by mouth daily.  . diclofenac (VOLTAREN) 75 MG EC tablet Take 75 mg by mouth daily.  Marland Kitchen HYDROcodone-acetaminophen (NORCO/VICODIN) 5-325 MG per tablet Take 1-2 tablets by mouth every 6 (six) hours as needed for moderate pain.  . fexofenadine (ALLEGRA) 180 MG tablet Take 180 mg by mouth daily.  . [DISCONTINUED] sulfamethoxazole-trimethoprim (BACTRIM DS,SEPTRA DS) 800-160 MG per tablet Take 1 tablet by mouth 2 (two) times daily.   No facility-administered encounter medications on file as of 04/25/2015.    Allergy:  Allergies  Allergen Reactions  . Aspirin Nausea And Vomiting  . Ciprofloxacin Diarrhea  . Flagyl [Metronidazole] Diarrhea  . Penicillins Hives    Social Hx:   Social History   Social History  . Marital Status: Married    Spouse Name: N/A  . Number of Children: N/A  . Years of Education: N/A   Occupational History  . Not on file.   Social History Main Topics  . Smoking status: Former Smoker -- 30 years    Types: Cigarettes    Quit date: 07/23/1994  . Smokeless tobacco: Not on file  . Alcohol Use:  No  . Drug Use: No  . Sexual Activity: Not on file   Other Topics Concern  . Not on file   Social History Narrative    Past Surgical Hx:  Past Surgical History  Procedure Laterality Date  . Mastectomy Left 2006  . Knee arthroscopy Left   . Tubal ligation    . Robotic assisted total hysterectomy with bilateral salpingo oopherectomy Bilateral 04/07/2015    Procedure:  ROBOTIC ASSISTED RADICAL HYSTERECTOMY BILATERAL SALPINGO OOPHORECTOMY BILATERAL SENTINEL LYMPHADENETOMY;  Surgeon: Everitt Amber, MD;  Location: [facility];  Service: Gynecology;  Laterality: Bilateral;    Past Medical Hx:  Past Medical History  Diagnosis Date  . Arthritis   . History of breast cancer 2006    Dr. Sonny Dandy oncologist   . Hyperlipidemia   . History of gout   . Cancer [facility])     BREAST CANCER / CERVICAL CANCER     Family Hx:  Family History  Problem Relation Age of Onset  . Prostate cancer Father   . Heart disease Father   . Breast cancer Sister   . Heart disease Mother     Vitals:  Blood pressure 126/60, pulse 80, temperature 98 F (36.7 C), temperature source Oral, resp. rate 18, height 5\' 3"  (1.6 m).  Physical Exam:  General: Well developed, well nourished female appearing uncomfortable. Alert and oriented x 3.  Abdomen: Abdomen slightly firm and distended.  Active bowel sounds in all quadrants. No evidence of a fluid wave or abdominal masses.  Extremities:  No bilateral cyanosis, edema, or clubbing.  Patient went to the bathroom to attempt to void and voided around 50 cc.  Supplies gathered to instruct patient on in and out catheterization.  Patient able to in and out cath herself with guidance without difficulty.  950 cc of clear, yellow urine obtained with catheterization.      Assessment/Plan:  Beau Vanduzer is a 74 year old s/p robotic assisted radical hysterectomy for squamous cell carcinoma of the cervix.  She is advised to begin in and out catheterization twice daily with the next time to be at bedtime per Dr. Denman George.  She is advised to record the amount she voids then the amount she has out with the catheter.  We will contact her in the am to discuss her volumes.  She is advised that is her volumes with the catheterization are greater than 300 cc, she will need to add another catheterization around lunch time.  Verbalizing understanding.  Supplies given.  Information  given on in and out catheterization, cleansing the urethra/vulva with soap and water, and how to cleanse the catheter itself.  Reportable signs and symptoms reviewed.   She is continue with her post-operative restrictions. She is advised to call for any questions or concerns.   Patient reviewed with Dr. Everitt Amber, who is agreeable with the above plan.  Majority of visit related to education on self catheterization, which took 20 minutes.   Dajai Wahlert DEAL, NP 04/26/2015, 12:54 PM

## 2015-04-26 NOTE — Telephone Encounter (Signed)
Patient called back stating she voided 75cc and then performed self cath. Output was 250cc. Instructed patient to self cath again before bedtime or sooner this afternoon/early evening if she feels abdominal firmness. Patient agreeable to this and will call our office back with any further concerns.

## 2015-04-26 NOTE — Telephone Encounter (Signed)
Call placed to follow up on self cath , patient states she urinated 100 cc prior to going to bed and self cath output was 800 cc . Patient states she urinated 750 cc intermittently throughout the night. Patient states she urinated 50 - 100 cc first thing this morning and self cath output was 700 cc. Melissa  Cross ANP updated , NP spoke with the patient and was instructed to increase self cath from 2 times daily to adding an additional self cath on at noon today and  to call our office back with her output. Patient stated understanding and will call back as directed.

## 2015-04-27 ENCOUNTER — Telehealth: Payer: Self-pay | Admitting: Gynecologic Oncology

## 2015-04-27 NOTE — Telephone Encounter (Signed)
Called to check on patient's current status with in and out cathing herself at home.  Reporting a 200 cc void at 7:15pm last night with 800 cc out with catheterization.  She leaked urine last pm and measured 800 cc through the night in the hat.  This am, she voided 100 cc and had 550 cc out with catheterization.  Information discussed with Dr. Denman George, who would like the patient to increase the frequency of in and out catheterization to 4 to 5 times a day.  Called patient back to explain her new instructions to increase the frequency of catheterization.  Patient verbalizing understanding and will call the office with an update.

## 2015-04-29 ENCOUNTER — Telehealth: Payer: Self-pay

## 2015-04-29 NOTE — Telephone Encounter (Signed)
Orders received from [place] to contact the patient to follow up on self catheterization and urine output. Spoke with Mrs Lape and she states she that yesterday evening she voided 150 cc at 7PM , 8 PM and 9 PM , she also voided 100 cc at 10 PM and then did a self cath and of 200 cc of urine output at 10:10 PM . Patient also states she had 800 cc of urine output intermittently throughout the night ( Voided ) . This morning she voided 100cc and did a self cath of 800cc at 0900 AM. [place]  aware , orders received to have the patient cath herself during the night when she gets up up to urinate. Patient also instructed to self cath at 4 times a day . Patient states understanding , denies further questions at this time , will call will concerns or changes.

## 2015-05-09 ENCOUNTER — Encounter: Payer: Self-pay | Admitting: Gynecologic Oncology

## 2015-05-09 ENCOUNTER — Ambulatory Visit: Payer: Medicare Other | Attending: Gynecologic Oncology | Admitting: Gynecologic Oncology

## 2015-05-09 VITALS — BP 119/84 | HR 90 | Temp 98.2°F | Resp 18 | Ht 63.0 in | Wt 125.0 lb

## 2015-05-09 DIAGNOSIS — C539 Malignant neoplasm of cervix uteri, unspecified: Secondary | ICD-10-CM | POA: Diagnosis not present

## 2015-05-09 DIAGNOSIS — R339 Retention of urine, unspecified: Secondary | ICD-10-CM | POA: Diagnosis not present

## 2015-05-09 DIAGNOSIS — N3943 Post-void dribbling: Secondary | ICD-10-CM

## 2015-05-09 DIAGNOSIS — Z7189 Other specified counseling: Secondary | ICD-10-CM | POA: Diagnosis not present

## 2015-05-09 DIAGNOSIS — Z90710 Acquired absence of both cervix and uterus: Secondary | ICD-10-CM

## 2015-05-09 NOTE — Progress Notes (Signed)
CERVICAL CANCER FOLLOWUP Assessment:    74 y.o. 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   S/p robotic radical hysterectomy, BSO and bilateral SLN biopsy on 04/07/15.   Plan: 1) Pathology reports reviewed today 2) Treatment counseling - I spent 20 minutes of face to face time counseling patient regarding her diagnosis of stage IB1 SCC of the cervix with intermediate risk factors for recurrence. I discussed that this recurrence risk has been shown to be decreased with the addition of adjuvant external beam radiation. She has an appointment with Dr Sondra Come to discuss this further. We briefly discussed risks of radiation, thought this will be further discussed with Dr Sondra Come She was given the opportunity to ask questions, which were answered to her satisfaction, and she is agreement with the above mentioned plan of care. 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 We reviewed the etiology of this condition and I explained the etiology of this condition and the importance of keeping the bladder adequately drained. I extensively counseled Pam [name] regarding the importance of forced voiding every 2 hours while awake and then always perform post void self catheterizations. She has been only voiding 4 times a day instead of the every 4 hours that was recommended. I encouraged her to be diligent about catheterizing and voiding every 2 hours while awake and informed her of the reasoning why. 4) Vaginal cuff mucosal separation. No dehiscence. - recommend not starting radiation until 6 weeks postop. Recommended no intercourse for an additional 6 weeks. 5)  Return to clinic 6-8 weeks to evaluate urinary retention  HPI:  Pam [name] is a 74 y.o. year old initially seen in consultation on 04/25/15 for cervical cancer, referred by Dr  Benjie Karvonen.  She then underwent a robotic radical hysterectomy, BSO, bilateral pelvic sentinel lymph node biopsy on 5/78/46 without complications. Preoperative PET scan on 03/30/15 was negative for metastatic disease Her postoperative course was complicated by the development of urinary retention and overflow incontinence. She required prolonged catheterization and then intermittent self catheterization teaching.  Her final pathology revealed a 4.5cm moderately differentiated SCC of the cervix which had no LVSI, but had deep cervical stromal invasion (11 of 59mm). She had negative margins, negative nodes and negative parametrium. Her closest margin was 27mm.  She is seen today for a postoperative check and to discuss her pathology results and ongoing plan.  Since discharge from the hospital, she is feeling frustrated by the bladder issues. She states that her husband has been catheterizing for her. She told me that she has been voiding and catheterizing every 4 hours, but on further questioning, she is only doing this 4 times a day (not every 4 hours). She has no urge to void. When she does go (4 times a day) it is because she is leaking urine. She will initiate with a voluntary effort for approximately 100-150cc. Then with catheterization she will get an additional 400cc out.    She has improving appetite, normal bowel and bladder function, and pain controlled with minimal PO medication. She has no other complaints today.  Current Outpatient Prescriptions on File Prior to Visit  Medication Sig Dispense Refill  . Calcium Carbonate (CALCIUM 600 PO) Take 1 tablet by mouth daily.    . Cholecalciferol (VITAMIN D PO) Take 1 capsule by mouth daily.    . diclofenac (VOLTAREN) 75 MG EC  tablet Take 75 mg by mouth daily.    . fexofenadine (ALLEGRA) 180 MG tablet Take 180 mg by mouth daily.    Marland Kitchen HYDROcodone-acetaminophen (NORCO/VICODIN) 5-325 MG per tablet Take 1-2 tablets by mouth every 6 (six) hours as needed for moderate  pain. 30 tablet 0   No current facility-administered medications on file prior to visit.   Allergies  Allergen Reactions  . Aspirin Nausea And Vomiting  . Ciprofloxacin Diarrhea  . Flagyl [Metronidazole] Diarrhea  . Penicillins Hives   Past Medical History  Diagnosis Date  . Arthritis   . History of breast cancer 2006    Dr. Sonny Dandy oncologist   . Hyperlipidemia   . History of gout   . Cancer [facility])     BREAST CANCER / CERVICAL CANCER    Past Surgical History  Procedure Laterality Date  . Mastectomy Left 2006  . Knee arthroscopy Left   . Tubal ligation    . Robotic assisted total hysterectomy with bilateral salpingo oopherectomy Bilateral 04/07/2015    Procedure: ROBOTIC ASSISTED RADICAL HYSTERECTOMY BILATERAL SALPINGO OOPHORECTOMY BILATERAL SENTINEL LYMPHADENETOMY;  Surgeon: Everitt Amber, MD;  Location: [facility];  Service: Gynecology;  Laterality: Bilateral;   Family History  Problem Relation Age of Onset  . Prostate cancer Father   . Heart disease Father   . Breast cancer Sister   . Heart disease Mother    Social History   Social History  . Marital Status: Married    Spouse Name: N/A  . Number of Children: N/A  . Years of Education: N/A   Occupational History  . Not on file.   Social History Main Topics  . Smoking status: Former Smoker -- 30 years    Types: Cigarettes    Quit date: 07/23/1994  . Smokeless tobacco: Not on file  . Alcohol Use: No  . Drug Use: No  . Sexual Activity: Not on file   Other Topics Concern  . Not on file   Social History Narrative     Review of systems: Constitutional:  She has no weight gain or weight loss. She has no fever or chills. Eyes: No blurred vision Ears, Nose, Mouth, Throat: No dizziness, headaches or changes in hearing. No mouth sores. Cardiovascular: No chest pain, palpitations or edema. Respiratory:  No shortness of breath, wheezing or cough Gastrointestinal: She has normal bowel movements without diarrhea or  constipation. She denies any nausea or vomiting. She denies blood in her stool or heart burn. Genitourinary:  She denies pelvic pain, pelvic pressure or changes in her urinary function. She has no hematuria, dysuria, or incontinence. She has no irregular vaginal bleeding or vaginal discharge Musculoskeletal: Denies muscle weakness or joint pains.  Skin:  She has no skin changes, rashes or itching Neurological:  Denies dizziness or headaches. No neuropathy, no numbness or tingling. Psychiatric:  She denies depression or anxiety. Hematologic/Lymphatic:   No easy bruising or bleeding   Physical Exam: Blood pressure 119/84, pulse 90, temperature 98.2 F (36.8 C), temperature source Oral, resp. rate 18, height 5\' 3"  (1.6 m), weight 125 lb (56.7 kg), SpO2 97 %. General: Well dressed, well nourished in no apparent distress.   HEENT:  Normocephalic and atraumatic, no lesions.  Extraocular muscles intact. Sclerae anicteric. Pupils equal, round, reactive. No mouth sores or ulcers. Thyroid is normal size, not nodular, midline. Skin:  No lesions or rashes. Abdomen:  Soft, nontender, nondistended.  No palpable masses.  No hepatosplenomegaly.  No ascites. Normal bowel sounds.  No hernias.  Incisions are well healed Genitourinary: Normal EGBUS  Vaginal cuff intact but with suture material visible, and gapping of mucosa between the sutures. No enteric contents visible or palpale, peritonealized behind mucosal separation.  No bleeding or discharge.  No cul de sac fullness. Bladder palpably distended with urine. Extremities: No cyanosis, clubbing or edema.  No calf tenderness or erythema. No palpable cords. Psychiatric: Mood and affect are appropriate. Neurological: Awake, alert and oriented x 3. Sensation is intact, no neuropathy.  Musculoskeletal: No pain, normal strength and range of motion.  Donaciano Eva, MD

## 2015-05-12 ENCOUNTER — Telehealth: Payer: Self-pay

## 2015-05-12 NOTE — Telephone Encounter (Signed)
Patient contacted to update with follow up appoinment with Dr Everitt Amber is scheduled for July 04, 2015 at 09:15 AM . Patient states she is aware of the appointment and was grateful for the follow up call. Denies further questions , changes or concerns at this time.

## 2015-05-13 DIAGNOSIS — N3289 Other specified disorders of bladder: Secondary | ICD-10-CM | POA: Diagnosis not present

## 2015-05-13 DIAGNOSIS — N39498 Other specified urinary incontinence: Secondary | ICD-10-CM | POA: Diagnosis not present

## 2015-05-13 DIAGNOSIS — M170 Bilateral primary osteoarthritis of knee: Secondary | ICD-10-CM | POA: Diagnosis not present

## 2015-05-13 NOTE — Progress Notes (Signed)
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 She went to see Dr. Benjie Karvonen who is the same physician her daughter sees. She had a Pap smear performed at that time. 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    Biopsies revealed:   04/07/15  Diagnosis 1. Lymph node, sentinel, biopsy, right mid obturator - ONE BENIGN LYMPH NODE WITH NO TUMOR SEEN (0/1). - SEE COMMENT UNDER ONCOLOGY TEMPLATE BELOW. 2. Lymph node, sentinel, biopsy, right proximal obturator - ONE BENIGN LYMPH NODE WITH NO TUMOR SEEN (0/1). - SEE COMMENT UNDER ONCOLOGY TEMPLATE BELOW. 3. Lymph node, sentinel, biopsy, right external iliac - ONE BENIGN LYMPH NODE WITH NO TUMOR SEEN (0/1). - SEE COMMENT UNDER ONCOLOGY TEMPLATE BELOW. 4. Lymph node, sentinel, biopsy, left external iliac - ONE BENIGN LYMPH NODE WITH NO TUMOR SEEN (0/1). - SEE COMMENT UNDER ONCOLOGY TEMPLATE BELOW. 5. Uterus +/- tubes/ovaries, neoplastic, with upper vagina - INVASIVE MODERATELY DIFFERENTIATED SQUAMOUS CELL CARCINOMA INVOLVING ENTIRE CERVIX AND EXTENDING INTO ENDOMETRIAL AND MYOMETRIAL TISSUE. - TUMOR MEASURES 4.5 CM IN GREATEST DIMENSION. - TWO BENIGN LEFT PARACERVICAL LYMPH NODES WITH NO TUMOR SEEN (0/2). - PARAMETRIAL SOFT TISSUE AND VAGINAL CUFF MARGIN ARE UNINVOLVED BY TUMOR. - SEE ONCOLOGY TEMPLATE AND COMMENT. ADDITIONAL FINDINGS: - BENIGN BILATERAL OVARIES AND FALLOPIAN TUBES WITH NO TUMOR SEEN. 6. Soft tissue, biopsy, left, true distal parametrial margin - BENIGN FIBROFATTY AND VASCULAR SOFT TISSUE. - BENIGN NERVES PRESENT. - NO TUMOR SEEN.  Past/Anticipated interventions by Gyn/Onc surgery, if any: 04/07/15 - Procedure: ROBOTIC ASSISTED RADICAL HYSTERECTOMY BILATERAL SALPINGO  OOPHORECTOMY BILATERAL SENTINEL LYMPHADENECTOMY;  Surgeon: Everitt Amber, MD;  Location: [facility];  Service: Gynecology;  Laterality: Bilateral;  Past/Anticipated interventions by medical oncology, if any: no  Weight changes, if any: no  Bowel/Bladder complaints, if any: does not have the urge to urinate since surgery.  She has been self-cathing every two hours.  She denies any bowel issues and her last bm was this morning.  Nausea/Vomiting, if any: no  Pain issues, if any:  No - reports her abdomen still feels "nervous" occasionally.  SAFETY ISSUES:  Prior radiation? no  Pacemaker/ICD? no  Possible current pregnancy? no  Is the patient on methotrexate? no  Current Complaints / other details:  Dr. Denman George is recommending adjuvant external beam radiation but recommends not starting radiation until 6 weeks postop due to vaginal cuff mucosal separation.  Patient has a history of left breast cancer with mastectomy in 2006.  She is here with her daughter.  She works in Water engineer at [employer].  She may be interested in treatment at [place].  BP 149/69 mmHg  Pulse 80  Temp(Src) 97.8 F (36.6 C) (Oral)  Resp 18  Ht 5\' 3"  (1.6 m)  Wt 124 lb 3.2 oz (56.337 kg)  BMI 22.01 kg/m2

## 2015-05-18 ENCOUNTER — Encounter: Payer: Self-pay | Admitting: Radiation Oncology

## 2015-05-18 ENCOUNTER — Ambulatory Visit
Admission: RE | Admit: 2015-05-18 | Discharge: 2015-05-18 | Disposition: A | Discharge status: Home / Self Care | Payer: Medicare Other | Source: Ambulatory Visit | Attending: Radiation Oncology | Admitting: Radiation Oncology

## 2015-05-18 VITALS — BP 149/69 | HR 80 | Temp 97.8°F | Resp 18 | Ht 63.0 in | Wt 124.2 lb

## 2015-05-18 DIAGNOSIS — C539 Malignant neoplasm of cervix uteri, unspecified: Secondary | ICD-10-CM

## 2015-05-18 HISTORY — DX: Malignant neoplasm of cervix uteri, unspecified: C53.9

## 2015-05-18 LAB — BUN AND CREATININE (CC13)
BUN: 11.5 mg/dL (ref 7.0–26.0)
CREATININE: 0.7 mg/dL (ref 0.6–1.1)
EGFR: 87 mL/min/{1.73_m2} — ABNORMAL LOW (ref >90)

## 2015-05-18 NOTE — Progress Notes (Signed)
FMLA paperwork received from RN (karen), original given to patient 05/18/15 Ardeen Fillers)

## 2015-05-18 NOTE — Progress Notes (Signed)
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  HISTORY OF PRESENT ILLNESS::Pam [name] is a 74 y.o. female who had episode of vaginal spotting  . She went to see Dr. Benjie Karvonen who is the same physician her daughter sees. She had a Pap smear performed at that time where the cervix felt firm on exam. She subsequently underwent an endometrial biopsy which revealed multiple fragments of invasive poorly differentiated squamous cell carcinoma. Her Pap smear revealed high-grade squamous intraepithelial lesion with severe dysplasia cannot exclude microinvasive carcinoma. The patient was referred to [place] where she was diagnosed with squamous cell carcinoma of the cervix. On 04/07/15, she underwent a robotic radical hysterectomy, BSO, bilateral pelvic sentinel lymph node biopsy on 9/74/16 without complications performed by Dr.Rossi. Preoperative PET scan on 03/30/15 was negative for metastatic disease. Her postoperative course was complicated by the development of urinary retention and overflow incontinence. She required prolonged catheterization and then intermittent self catheterization teaching. Her final pathology revealed a 4.5cm moderately differentiated SCC of the cervix which had no LVSI, but had deep cervical stromal invasion (11 of 50mm). She had negative margins, negative nodes and negative parametrium. Her closest margin was 22mm.  Dr. Denman George has referred her to me today to discuss radiation treatment in the management of her disease. She is recommending adjuvant external beam radiation but recommends not starting radiation until 6 weeks postop due to vaginal cuff  mucosal separation.   The patient's last Pap smear was in 2006 , she's never had an abnormal Pap smear. She has lost about 70 pounds in the last 3-4 years. She is up-to-date on her mammograms having had one last in July 2016. She had a breast biopsy done in May 2014 that was negative. She has a personal history of left breast cancer treated in 2006 with mastectomy followed by Adriamycin and Cytoxan based chemotherapy. She is up-to-date on her colonoscopy. Patient states she has used Flomax given to her by her primary care physician for her urinary symptoms She sees her PCP, Dr.Hasani, up near [place]. Patient notes she is catheterizing herself about every 2 hours with approximate volume of 250 cc with no cloudiness. The patient is still working at [employer] and enjoys her work. She works primarily with Medical sales representative.  PREVIOUS RADIATION THERAPY: No   PAST MEDICAL HISTORY:  has a past medical history of Arthritis; History of breast cancer (2006); Hyperlipidemia; History of gout; Cancer [facility]); and Cervical cancer ([place]).    PAST SURGICAL HISTORY: Past Surgical History  Procedure Laterality Date  . Mastectomy Left 2006  . Knee arthroscopy Left   . Tubal ligation    . Robotic assisted total hysterectomy with bilateral salpingo oopherectomy Bilateral 04/07/2015    Procedure: ROBOTIC ASSISTED RADICAL HYSTERECTOMY BILATERAL SALPINGO OOPHORECTOMY BILATERAL SENTINEL LYMPHADENETOMY;  Surgeon: Everitt Amber, MD;  Location: [facility];  Service: Gynecology;  Laterality: Bilateral;    FAMILY HISTORY: family history includes Breast cancer in her sister; Heart disease in her father and mother; Prostate cancer in her father.  SOCIAL HISTORY:  reports that she quit smoking about 20 years ago. Her smoking use included Cigarettes. She has a 30 pack-year smoking history. She has never used  smokeless tobacco. She reports that she does not drink alcohol or use illicit drugs.  ALLERGIES: Aspirin; Ciprofloxacin; Flagyl;  and Penicillins  MEDICATIONS:  Current Outpatient Prescriptions  Medication Sig Dispense Refill  . Calcium Carbonate (CALCIUM 600 PO) Take 1 tablet by mouth daily.    . Cholecalciferol (VITAMIN D PO) Take 1 capsule by mouth daily.    . diclofenac (VOLTAREN) 75 MG EC tablet Take 75 mg by mouth daily.    . fexofenadine (ALLEGRA) 180 MG tablet Take 180 mg by mouth daily.    Marland Kitchen HYDROcodone-acetaminophen (NORCO/VICODIN) 5-325 MG per tablet Take 1-2 tablets by mouth every 6 (six) hours as needed for moderate pain. (Patient not taking: Reported on 05/18/2015) 30 tablet 0   No current facility-administered medications for this encounter.    REVIEW OF SYSTEMS:  A 15 point review of systems is documented in the electronic medical record. This was obtained by the nursing staff. However, I reviewed this with the patient to discuss relevant findings and make appropriate changes.  Pertinent items are noted in HPI.   PHYSICAL EXAM:  height is 5\' 3"  (1.6 m) and weight is 124 lb 3.2 oz (56.337 kg). Her oral temperature is 97.8 F (36.6 C). Her blood pressure is 149/69 and her pulse is 80. Her respiration is 18.   General: Well-developed, in no acute distress Extremities: No edema present HEENT: Normocephalic, atraumatic No palpable cervical, supraclavicular or axillary lymphoadenopathy. The heart has a regular rate and rhythm. The lungs are clear to auscultation. The abdomen is soft and non-tender. 5/5 strength.  Left breast mastectomy scar, well healed. No signs of local recurrence . Without palpable mass in right breast. Abdomen shows 5 small scars from her laparoscopic procedure. No signs of infection, no inguinal adenopathy. On pelvic exam, external genitalia unremarkable. On speculum exam, the cuff is healing well with no visible sutures. On bimanual exam, cuff is intact with no pelvic masses.     ECOG = 1  LABORATORY DATA:  Lab Results  Component Value Date   WBC 14.3* 04/08/2015   HGB 11.9*  04/08/2015   HCT 35.8* 04/08/2015   MCV 86.5 04/08/2015   PLT 264 04/08/2015   NEUTROABS 6.6 04/01/2015   Lab Results  Component Value Date   NA 139 04/08/2015   K 4.3 04/08/2015   CL 104 04/08/2015   CO2 28 04/08/2015   GLUCOSE 167* 04/08/2015   CREATININE 0.69 04/08/2015   CALCIUM 8.6* 04/08/2015     RADIOGRAPHY: Preop PET scan as above    IMPRESSION: 1. Intense metabolic activity associated uterine body / endometrial canal consistent with primary uterine carcinoma. 2. No hypermetabolic pelvic or abdominal lymph nodes. Subcentimeter iliac lymph nodes do not have associated metabolic activity. 3. Single hypermetabolic precarinal lymph node is unlikely GYN metastasis. This small lymph node has normal morphology and is likely reactive.  IMPRESSION: The patient is diagnosed with stage IB1 moderately differentiated squamous cell carcinoma of the cervix. The patient is at risk for recurrence based on pathologic findings. She appears to understand and wants to proceed with treatment.  PLAN: We discussed the possible side effects and risks of treatment in addition to the possible benefits of treatment. We discussed the protocol for radiation treatment.  All of the patient's questions were answered. The patient does wish to proceed with this treatment and has signed a consent form.   Simulation is scheduled Nov. 1st. I have recommenced IMRT to limit dose to her small bowel in light of her  hysterectomy.   I will put in a request for blood work including BUN and creatinine for renal function in anticipation for IV contrast.  I anticipate a 5.5 weeks course of radiation therapy.    ------------------------------------------------  Blair Promise, PhD, MD   This document serves as a record of services personally performed by Gery Pray, MD. It was created on his behalf by Derek Mound, a trained medical scribe. The creation of this record is based on the scribe's personal  observations and the provider's statements to them. This document has been checked and approved by the attending provider.

## 2015-05-18 NOTE — Progress Notes (Signed)
Please see the Nurse Progress Note in the MD Initial Consult Encounter for this patient. 

## 2015-05-24 ENCOUNTER — Ambulatory Visit
Admission: RE | Admit: 2015-05-24 | Discharge: 2015-05-24 | Disposition: A | Discharge status: Home / Self Care | Payer: Medicare Other | Source: Ambulatory Visit | Attending: Radiation Oncology | Admitting: Radiation Oncology

## 2015-05-24 ENCOUNTER — Encounter: Payer: Self-pay | Admitting: Oncology

## 2015-05-24 ENCOUNTER — Ambulatory Visit: Admission: RE | Admit: 2015-05-24 | Payer: Medicare Other | Source: Ambulatory Visit | Admitting: Radiation Oncology

## 2015-06-27 ENCOUNTER — Other Ambulatory Visit: Payer: Self-pay | Admitting: Dermatology

## 2015-06-27 DIAGNOSIS — L570 Actinic keratosis: Secondary | ICD-10-CM | POA: Diagnosis not present

## 2015-06-27 DIAGNOSIS — C44722 Squamous cell carcinoma of skin of right lower limb, including hip: Secondary | ICD-10-CM | POA: Diagnosis not present

## 2015-06-27 DIAGNOSIS — C44721 Squamous cell carcinoma of skin of unspecified lower limb, including hip: Secondary | ICD-10-CM | POA: Diagnosis not present

## 2015-07-04 ENCOUNTER — Ambulatory Visit: Payer: Medicare Other | Attending: Gynecologic Oncology | Admitting: Gynecologic Oncology

## 2015-07-04 ENCOUNTER — Encounter: Payer: Self-pay | Admitting: Gynecologic Oncology

## 2015-07-04 VITALS — BP 167/61 | HR 75 | Temp 98.0°F | Resp 18 | Ht 64.0 in | Wt 125.5 lb

## 2015-07-04 DIAGNOSIS — N39490 Overflow incontinence: Secondary | ICD-10-CM | POA: Insufficient documentation

## 2015-07-04 DIAGNOSIS — C530 Malignant neoplasm of endocervix: Secondary | ICD-10-CM | POA: Insufficient documentation

## 2015-07-04 MED ORDER — UNABLE TO FIND
Status: DC
Start: 2015-07-04 — End: 2019-07-14

## 2015-07-04 MED ORDER — UNABLE TO FIND: Status: DC

## 2015-07-04 NOTE — Patient Instructions (Signed)
We will contact you with an appointment to see the urologist at [facility].  Plan to follow up with Dr. Denman George in March or sooner if needed.  Please call for any new symptoms including vaginal bleeding.

## 2015-07-04 NOTE — Progress Notes (Signed)
CERVICAL CANCER FOLLOWUP Assessment:    74 y.o. 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   S/p robotic radical hysterectomy, BSO and bilateral SLN biopsy on 04/07/15.   Urinary incontinence (overflow) and bladder dysfunction (lack of sensation of voiding)  She feels extremely frustrated by having to get up to catheterize q 2 hours.  Plan: 1) Treatment counseling - We reviewed her diagnosis of stage IB1 SCC of the cervix with intermediate risk factors for recurrence. We discussed that it was our recommendation for adjuvant radiation to decrease risk of recurrence, however, the patient declined this after learning of potential side effects. I discussed that we will monitor for recurrence, but that treatment of recurrence is associated with a lower survival rate than what is seen after receiving adjuvant therapy.  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 We reviewed the etiology of this condition and I explained the etiology of this condition and the importance of keeping the bladder adequately drained. I do think it is reasonable to begin to space out her voids a little. If she finds she is having overflow incontinence, she will need to go back to self cathterizing every 2 hours.  I will have her seen by Dr Matilde Sprang to better evaluate the detrusor function and potentially assist in remedies  4) Vaginal cuff mucosal separation. Resolved - in tact.  5)  Return to clinic 3 months to evaluate for surveillance of cancer  HPI:  Pam [name] is a 74 y.o. year old initially seen in consultation on 04/25/15 for cervical cancer, referred by Dr Benjie Karvonen.  She then underwent a robotic radical hysterectomy, BSO, bilateral pelvic sentinel lymph node biopsy on [id number] without complications. Preoperative PET scan on 03/30/15 was  negative for metastatic disease Her postoperative course was complicated by the development of urinary retention and overflow incontinence. She required prolonged catheterization and then intermittent self catheterization teaching.  Her final pathology revealed a 4.5cm moderately differentiated SCC of the cervix which had no LVSI, but had deep cervical stromal invasion (11 of 63mm). She had negative margins, negative nodes and negative parametrium. Her closest margin was 46mm.  She was recommended adjuvant pelvic RT to decrease risk of recurrence, but declined this after counseling with Dr Sondra Come due to patient concern regarding toxicity.   She developed postoperative urinary retention likely secondary to the radical parametrial dissection and interruption of hypogastric nerves from her radical procedure. She was encouraged to self cath every 2hours to keep herself dry (she would have incontinence if she left it longer).  Interval Hx.    The patient is extremely frustrated with having to get up to void every 2 hours. She has no urge to void. However if she delays voiding substantially she has overflow incontinence. She continues to self cath at home.   Current Outpatient Prescriptions on File Prior to Visit  Medication Sig Dispense Refill  . Calcium Carbonate (CALCIUM 600 PO) Take 1 tablet by mouth daily.    . Cholecalciferol (VITAMIN D PO) Take 1 capsule by mouth daily.    . diclofenac (VOLTAREN) 75 MG EC tablet Take 75 mg by mouth daily.    . fexofenadine (ALLEGRA) 180 MG tablet Take 180 mg by mouth daily.    Marland Kitchen HYDROcodone-acetaminophen (NORCO/VICODIN) 5-325 MG per tablet Take 1-2 tablets by mouth every 6 (six) hours as needed for moderate pain. [place]  tablet 0   No current facility-administered medications on file prior to visit.   Allergies  Allergen Reactions  . Aspirin Nausea And Vomiting  . Ciprofloxacin Diarrhea  . Flagyl [Metronidazole] Diarrhea  . Penicillins Hives   Past Medical  History  Diagnosis Date  . Arthritis   . History of breast cancer 2006    Dr. Sonny Dandy oncologist - left breast  . Hyperlipidemia   . History of gout   . Cancer [facility])     BREAST CANCER / CERVICAL CANCER   . Cervical cancer [facility])    Past Surgical History  Procedure Laterality Date  . Mastectomy Left 2006  . Knee arthroscopy Left   . Tubal ligation    . Robotic assisted total hysterectomy with bilateral salpingo oopherectomy Bilateral 04/07/2015    Procedure: ROBOTIC ASSISTED RADICAL HYSTERECTOMY BILATERAL SALPINGO OOPHORECTOMY BILATERAL SENTINEL LYMPHADENETOMY;  Surgeon: Everitt Amber, MD;  Location: [facility];  Service: Gynecology;  Laterality: Bilateral;   Family History  Problem Relation Age of Onset  . Prostate cancer Father   . Heart disease Father   . Breast cancer Sister   . Heart disease Mother    Social History   Social History  . Marital Status: Married    Spouse Name: N/A  . Number of Children: 1  . Years of Education: N/A   Occupational History  . works at [employer] in [employer]    Social History Main Topics  . Smoking status: Former Smoker -- 1.00 packs/day for 30 years    Types: Cigarettes    Quit date: 07/23/1994  . Smokeless tobacco: Never Used  . Alcohol Use: No  . Drug Use: No  . Sexual Activity: Not Currently   Other Topics Concern  . Not on file   Social History Narrative     Review of systems: Constitutional:  She has no weight gain or weight loss. She has no fever or chills. Eyes: No blurred vision Ears, Nose, Mouth, Throat: No dizziness, headaches or changes in hearing. No mouth sores. Cardiovascular: No chest pain, palpitations or edema. Respiratory:  No shortness of breath, wheezing or cough Gastrointestinal: She has normal bowel movements without diarrhea or constipation. She denies any nausea or vomiting. She denies blood in her stool or heart burn. Genitourinary:  She denies pelvic pain, pelvic pressure or changes in her urinary  function. She has no hematuria, dysuria, or incontinence. She has no irregular vaginal bleeding or vaginal discharge Musculoskeletal: Denies muscle weakness or joint pains.  Skin:  She has no skin changes, rashes or itching Neurological:  Denies dizziness or headaches. No neuropathy, no numbness or tingling. Psychiatric:  She denies depression or anxiety. Hematologic/Lymphatic:   No easy bruising or bleeding   Physical Exam: Blood pressure 167/61, pulse 75, temperature 98 F (36.7 C), temperature source Oral, resp. rate 18, height 5\' 4"  (1.626 m), weight 125 lb 8 oz (56.926 kg), SpO2 98 %. General: Well dressed, well nourished in no apparent distress.   HEENT:  Normocephalic and atraumatic, no lesions.  Extraocular muscles intact. Sclerae anicteric. Pupils equal, round, reactive. No mouth sores or ulcers. Thyroid is normal size, not nodular, midline. Skin:  No lesions or rashes. Abdomen:  Soft, nontender, nondistended.  No palpable masses.  No hepatosplenomegaly.  No ascites. Normal bowel sounds.  No hernias.  Incisions are well healed Genitourinary: Normal EGBUS  Vaginal cuff intact.   No bleeding or discharge.  No cul de sac fullness. Bladder palpably distended with urine. Extremities: No cyanosis,  clubbing or edema.  No calf tenderness or erythema. No palpable cords. Psychiatric: Mood and affect are appropriate. Neurological: Awake, alert and oriented x 3. Sensation is intact, no neuropathy.  Musculoskeletal: No pain, normal strength and range of motion.  Donaciano Eva, MD

## 2015-07-05 ENCOUNTER — Other Ambulatory Visit: Payer: Self-pay | Admitting: *Deleted

## 2015-07-05 ENCOUNTER — Telehealth: Payer: Self-pay | Admitting: *Deleted

## 2015-07-05 DIAGNOSIS — R339 Retention of urine, unspecified: Secondary | ICD-10-CM

## 2015-07-05 DIAGNOSIS — N39490 Overflow incontinence: Secondary | ICD-10-CM

## 2015-07-05 DIAGNOSIS — C539 Malignant neoplasm of cervix uteri, unspecified: Secondary | ICD-10-CM

## 2015-07-05 NOTE — Telephone Encounter (Signed)
Referral made to [facility] app[facility] for pt on 07/13/15 at [place] and notified pt, appt verbalized and confirmed. No further concerns

## 2015-07-13 DIAGNOSIS — R338 Other retention of urine: Secondary | ICD-10-CM | POA: Diagnosis not present

## 2015-07-28 DIAGNOSIS — C44722 Squamous cell carcinoma of skin of right lower limb, including hip: Secondary | ICD-10-CM | POA: Diagnosis not present

## 2015-08-12 DIAGNOSIS — M170 Bilateral primary osteoarthritis of knee: Secondary | ICD-10-CM | POA: Diagnosis not present

## 2015-08-12 DIAGNOSIS — J018 Other acute sinusitis: Secondary | ICD-10-CM | POA: Diagnosis not present

## 2015-08-22 DIAGNOSIS — R338 Other retention of urine: Secondary | ICD-10-CM | POA: Diagnosis not present

## 2015-09-09 DIAGNOSIS — N3942 Incontinence without sensory awareness: Secondary | ICD-10-CM | POA: Diagnosis not present

## 2015-09-09 DIAGNOSIS — R338 Other retention of urine: Secondary | ICD-10-CM | POA: Diagnosis not present

## 2015-10-07 ENCOUNTER — Telehealth: Payer: Self-pay | Admitting: Gynecologic Oncology

## 2015-10-07 ENCOUNTER — Ambulatory Visit: Payer: Medicare Other | Attending: Gynecologic Oncology | Admitting: Gynecologic Oncology

## 2015-10-07 ENCOUNTER — Encounter: Payer: Self-pay | Admitting: Gynecologic Oncology

## 2015-10-07 VITALS — BP 133/73 | HR 90 | Temp 97.9°F | Resp 18 | Ht 64.0 in | Wt 128.0 lb

## 2015-10-07 DIAGNOSIS — C539 Malignant neoplasm of cervix uteri, unspecified: Secondary | ICD-10-CM

## 2015-10-07 DIAGNOSIS — Z853 Personal history of malignant neoplasm of breast: Secondary | ICD-10-CM | POA: Insufficient documentation

## 2015-10-07 DIAGNOSIS — Z08 Encounter for follow-up examination after completed treatment for malignant neoplasm: Secondary | ICD-10-CM | POA: Insufficient documentation

## 2015-10-07 DIAGNOSIS — Z8541 Personal history of malignant neoplasm of cervix uteri: Secondary | ICD-10-CM | POA: Diagnosis not present

## 2015-10-07 DIAGNOSIS — Z90710 Acquired absence of both cervix and uterus: Secondary | ICD-10-CM | POA: Insufficient documentation

## 2015-10-07 DIAGNOSIS — R339 Retention of urine, unspecified: Secondary | ICD-10-CM | POA: Diagnosis not present

## 2015-10-07 DIAGNOSIS — Z87891 Personal history of nicotine dependence: Secondary | ICD-10-CM | POA: Insufficient documentation

## 2015-10-07 DIAGNOSIS — M109 Gout, unspecified: Secondary | ICD-10-CM | POA: Insufficient documentation

## 2015-10-07 DIAGNOSIS — K4090 Unilateral inguinal hernia, without obstruction or gangrene, not specified as recurrent: Secondary | ICD-10-CM | POA: Diagnosis not present

## 2015-10-07 DIAGNOSIS — M1990 Unspecified osteoarthritis, unspecified site: Secondary | ICD-10-CM | POA: Insufficient documentation

## 2015-10-07 DIAGNOSIS — E785 Hyperlipidemia, unspecified: Secondary | ICD-10-CM | POA: Insufficient documentation

## 2015-10-07 DIAGNOSIS — Z9010 Acquired absence of unspecified breast and nipple: Secondary | ICD-10-CM | POA: Insufficient documentation

## 2015-10-07 DIAGNOSIS — K469 Unspecified abdominal hernia without obstruction or gangrene: Secondary | ICD-10-CM | POA: Insufficient documentation

## 2015-10-07 DIAGNOSIS — R32 Unspecified urinary incontinence: Secondary | ICD-10-CM

## 2015-10-07 DIAGNOSIS — N842 Polyp of vagina: Secondary | ICD-10-CM | POA: Diagnosis not present

## 2015-10-07 NOTE — Telephone Encounter (Signed)
Called and spoke with Janett Billow at [facility] about new patient referral to see Dr. Hassell Done for inguinal hernia repair.  Appt on April 13 at [facility] and left message for patient with above information.

## 2015-10-07 NOTE — Progress Notes (Signed)
CERVICAL CANCER FOLLOWUP Assessment:    75 y.o. 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   S/p robotic radical hysterectomy, BSO and bilateral SLN biopsy on 04/07/15.   1/ Urinary incontinence (overflow) and bladder dysfunction (lack of sensation of voiding) Stable though not improving.   2/ granulation tissue at vaginal cuff  3/ right inguinal hernia  Plan: 1) cervical cancer: continue 3 monthly followup with annual vaginal cytology  2) Urinary retention: likely secondary to both nerve interruption from radical hysterectomy and secondary to urinary retention due to inadequate voiding. Continue care with Dr Matilde Sprang appreciate his assistance.  4) Vaginal cuff mucosal polyp - likely granulation tissue. Biopsy sent today.  5) symptomatic right inguinal hernia - referral to Dr Hassell Done or partners from [place]   6)  Surveillance: Return to clinic 3 months to evaluate for surveillance of cancer  HPI:  Pam [name] is a 75 y.o. year old initially seen in consultation on 04/25/15 for cervical cancer, referred by Dr Benjie Karvonen.  She then underwent a robotic radical hysterectomy, BSO, bilateral pelvic sentinel lymph node biopsy on [id number] without complications. Preoperative PET scan on 03/30/15 was negative for metastatic disease Her postoperative course was complicated by the development of urinary retention and overflow incontinence. She required prolonged catheterization and then intermittent self catheterization teaching.  Her final pathology revealed a 4.5cm moderately differentiated SCC of the cervix which had no LVSI, but had deep cervical stromal invasion (11 of 50mm). She had negative margins, negative nodes and negative parametrium. Her closest margin was 13mm.  She was recommended adjuvant pelvic RT to decrease risk of recurrence, but declined this after counseling with Dr Sondra Come due to patient concern regarding toxicity.    She developed postoperative urinary retention likely secondary to the radical parametrial dissection and interruption of hypogastric nerves from her radical procedure. She was encouraged to self cath every 2hours to keep herself dry (she would have incontinence if she left it longer).  Interval Hx.  The patient saw Dr Matilde Sprang and has been continuing to self cath every 2 - 3 hours. She spontaneously voids only a little (50cc). She has some acceptance with this (less frustration).  She has noted mild vaginal discharge in the past month.  She has noted a painful bulge in the right groin in the past 1 month. It is worse with standing. Goes away in supine. Worse when she has been on her feet for a while.  Current Outpatient Prescriptions on File Prior to Visit  Medication Sig Dispense Refill  . Calcium Carbonate (CALCIUM 600 PO) Take 1 tablet by mouth daily.    . Cholecalciferol (VITAMIN D PO) Take 1 capsule by mouth daily.    . diclofenac (VOLTAREN) 75 MG EC tablet Take 75 mg by mouth daily.    Marland Kitchen UNABLE TO FIND In and out foley catheter 14 Fr. Q# 20   Refills: none Insert catheter every 2 hours to empty bladder. 1 each 1  . fexofenadine (ALLEGRA) 180 MG tablet Take 180 mg by mouth daily.     No current facility-administered medications on file prior to visit.   Allergies  Allergen Reactions  . Aspirin Nausea And Vomiting  . Ciprofloxacin Diarrhea  . Flagyl [Metronidazole] Diarrhea  . Penicillins Hives   Past Medical History  Diagnosis Date  . Arthritis   . History of breast cancer 2006    Dr. Sonny Dandy oncologist - left breast  . Hyperlipidemia   .  History of gout   . Cancer [facility])     BREAST CANCER / CERVICAL CANCER   . Cervical cancer [facility])    Past Surgical History  Procedure Laterality Date  . Mastectomy Left 2006  . Knee arthroscopy Left   . Tubal ligation    . Robotic assisted total hysterectomy with bilateral salpingo oopherectomy Bilateral 04/07/2015    Procedure:  ROBOTIC ASSISTED RADICAL HYSTERECTOMY BILATERAL SALPINGO OOPHORECTOMY BILATERAL SENTINEL LYMPHADENETOMY;  Surgeon: Everitt Amber, MD;  Location: [facility];  Service: Gynecology;  Laterality: Bilateral;   Family History  Problem Relation Age of Onset  . Prostate cancer Father   . Heart disease Father   . Breast cancer Sister   . Heart disease Mother    Social History   Social History  . Marital Status: Married    Spouse Name: N/A  . Number of Children: 1  . Years of Education: N/A   Occupational History  . works at [employer] in [employer]    Social History Main Topics  . Smoking status: Former Smoker -- 1.00 packs/day for 30 years    Types: Cigarettes    Quit date: 07/23/1994  . Smokeless tobacco: Never Used  . Alcohol Use: No  . Drug Use: No  . Sexual Activity: Not Currently   Other Topics Concern  . Not on file   Social History Narrative     Review of systems: Constitutional:  She has no weight gain or weight loss. She has no fever or chills. Eyes: No blurred vision Ears, Nose, Mouth, Throat: No dizziness, headaches or changes in hearing. No mouth sores. Cardiovascular: No chest pain, palpitations or edema. Respiratory:  No shortness of breath, wheezing or cough Gastrointestinal: She has normal bowel movements without diarrhea or constipation. She denies any nausea or vomiting. She denies blood in her stool or heart burn. Genitourinary:  She denies pelvic pain, pelvic pressure or changes in her urinary function. She has no hematuria, dysuria, or incontinence. + vaginal discharge Musculoskeletal: Denies muscle weakness or joint pains. + right inguinal bulge on standnig Skin:  She has no skin changes, rashes or itching Neurological:  Denies dizziness or headaches. No neuropathy, no numbness or tingling. Psychiatric:  She denies depression or anxiety. Hematologic/Lymphatic:   No easy bruising or bleeding   Physical Exam: Blood pressure 133/73, pulse 90,  temperature 97.9 F (36.6 C), temperature source Oral, resp. rate 18, height 5\' 4"  (1.626 m), weight 128 lb (58.06 kg), SpO2 98 %. General: Well dressed, well nourished in no apparent distress.   HEENT:  Normocephalic and atraumatic, no lesions.  Extraocular muscles intact. Sclerae anicteric. Pupils equal, round, reactive. No mouth sores or ulcers. Thyroid is normal size, not nodular, midline. Skin:  No lesions or rashes. Abdomen:  Soft, nontender, nondistended.  No palpable masses.  No hepatosplenomegaly.  No ascites. Normal bowel sounds. On supine there are no masses. Upon standing there is a bulge in the right inguinal/lower abdomen consistent with a direct hernia. It is palpably soft and reducible.  Incisions are well healed Genitourinary: Normal EGBUS  Vaginal cuff with 1.5cm polypoid structure from central cuff. Removed entirely with biopsy.   No bleeding or discharge.  No cul de sac fullness. Bladder not palpably distended with urine. Extremities: No cyanosis, clubbing or edema.  No calf tenderness or erythema. No palpable cords. Psychiatric: Mood and affect are appropriate. Neurological: Awake, alert and oriented x 3. Sensation is intact, no neuropathy.  Musculoskeletal: No pain, normal strength and range of  motion.  PROCEDURE:  The vaginal cuff polyp was grasped with a single tooth tenaculum and removed in entirity. It was sent for pathology. Bleeding at the base was made hemostatic with silver nitrate and pressure. THe patient tolerated the procedure well.  Donaciano Eva, MD

## 2015-10-07 NOTE — Patient Instructions (Signed)
We will call you with the results of your biopsy from today.  We will also call you with an appt to meet with Dr. Hassell Done at [facility].  Plan to follow up with Dr. Denman George in three months or sooner if needed. Please call for any questions or concerns.

## 2015-10-10 ENCOUNTER — Ambulatory Visit: Payer: Medicare Other | Admitting: Gynecologic Oncology

## 2015-10-10 ENCOUNTER — Telehealth: Payer: Self-pay | Admitting: Gynecologic Oncology

## 2015-10-10 NOTE — Telephone Encounter (Signed)
Patient's daughter called for her mother since she was at work asking about the results of her biopsy.  Informed of results along with reinforced date for upcoming appt at [facility] with Dr. Hassell Done for hernia.  Advised to call for any questions or concerns.

## 2015-10-10 NOTE — Telephone Encounter (Signed)
Attempted to call the patient at home and at work.  Will retry at a later time.  Called back to the home residence and left a message with the husband asking him to please let Pam [name] know to call our office.

## 2015-11-02 DIAGNOSIS — D692 Other nonthrombocytopenic purpura: Secondary | ICD-10-CM | POA: Diagnosis not present

## 2015-11-02 DIAGNOSIS — L728 Other follicular cysts of the skin and subcutaneous tissue: Secondary | ICD-10-CM | POA: Diagnosis not present

## 2015-11-02 DIAGNOSIS — D239 Other benign neoplasm of skin, unspecified: Secondary | ICD-10-CM | POA: Diagnosis not present

## 2015-11-03 ENCOUNTER — Ambulatory Visit: Payer: Self-pay | Admitting: Surgery

## 2015-11-03 DIAGNOSIS — K4090 Unilateral inguinal hernia, without obstruction or gangrene, not specified as recurrent: Secondary | ICD-10-CM | POA: Diagnosis not present

## 2015-11-03 NOTE — H&P (Signed)
Pam [name]. Suchy 11/03/2015 9:08 AM Location: [facility] Patient #: [id number] DOB: August 30, 1940 Married / Language: English / Race: White Female   History of Present Illness Rodman Key B. Hassell Done MD; 11/03/2015 9:57 AM) Patient words: hernia.  The patient is a 75 year old female who presents with an inguinal hernia. Pam [name] comes in with her daughter with a symptomatic right inguinal hernia. 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   She has not had any episodes of bowel obstruction or incarceration. I explained lap and open repair of this inguinal hernia. On exam, I believe that it is an indirect hernia but she does have leg pain with it so I need to rule out a right femoral component. Therefore I will place a laparoscope and exam and then perform an open repair. She is aware of the risks or recurrence and injury.    Other Problems Pam [name], CMA; 11/03/2015 9:08 AM) Arthritis Bladder Problems Breast Cancer Cervical Cancer Gastroesophageal Reflux Disease Oophorectomy  Past Surgical History Pam [name], CMA; 11/03/2015 9:08 AM) Hysterectomy (due to cancer) - Complete Knee Surgery Left. Mastectomy Left.  Diagnostic Studies History Pam [name], CMA; 11/03/2015 9:08 AM) Colonoscopy 5-10 years ago Mammogram within last year Pap Smear 1-5 years ago  Allergies Pam [name], CMA; 11/03/2015 9:09 AM) Aspirin *ANALGESICS - NonNarcotic* Ciprofloxacin *CHEMICALS* Flagyl *ANTI-INFECTIVE AGENTS - MISC.* PenicillAMINE *Assorted Classes**  Medication History (Pam [name], CMA; 11/03/2015 9:10 AM) Calcium "900" w/D (Oral) Active. Vitamin D (Cholecalciferol) (1000UNIT Capsule, Oral) Active. Voltaren (75MG  Tablet DR, Oral) Active. Allegra (180MG  Tablet, Oral) Active. Medications Reconciled  Social History Pam [name], CMA; 11/03/2015 9:08 AM) Alcohol use Remotely quit alcohol use. Caffeine use Carbonated beverages, Tea. No drug use Tobacco use Former smoker.  Family History Pam [name], [place]; 11/03/2015 9:08 AM) Arthritis Father. Breast Cancer Sister. Colon Polyps Sister. Heart Disease Brother, Father, Mother. Migraine Headache Daughter. Prostate Cancer Father. Respiratory Condition Sister. Thyroid problems Daughter.  Pregnancy / Birth History Pam [name], [place]; 11/03/2015 9:08 AM) Age at menarche 70 years. Age of menopause 65-55 Gravida 1 Maternal age 68-25 Para 1    Review of Systems ([place]; 11/03/2015 9:08 AM) General Not Present- Appetite Loss, Chills, Fatigue, Fever, Night Sweats, Weight Gain and Weight Loss. Skin Not Present- Change in Wart/Mole, Dryness, Hives, Jaundice, New Lesions, Non-Healing Wounds, Rash and Ulcer. HEENT Present- Wears glasses/contact lenses. Not Present- Earache, Hearing Loss, Hoarseness, Nose Bleed, Oral Ulcers, Ringing in the Ears, Seasonal Allergies, Sinus Pain, Sore Throat, Visual Disturbances and Yellow Eyes. Respiratory Not Present- Bloody sputum, Chronic Cough, Difficulty Breathing, Snoring and Wheezing. Breast Not Present- Breast Mass, Breast Pain, Nipple Discharge and Skin Changes. Cardiovascular Present- Leg Cramps and Swelling of Extremities. Not Present- Chest Pain, Difficulty Breathing Lying Down, Palpitations, Rapid Heart Rate and Shortness of Breath. Gastrointestinal Not Present- Abdominal Pain, Bloating, Bloody Stool, Change in Bowel Habits, Chronic diarrhea, Constipation, Difficulty Swallowing, Excessive gas, Gets full quickly at meals, Hemorrhoids, Indigestion, Nausea, Rectal Pain and Vomiting. Female Genitourinary Present- Nocturia. Not Present- Frequency, Painful Urination, Pelvic Pain and Urgency. Musculoskeletal Present- Joint Pain. Not Present- Back Pain, Joint Stiffness, Muscle Pain, Muscle Weakness and Swelling of  Extremities. Neurological Not Present- Decreased Memory, Fainting, Headaches, Numbness, Seizures, Tingling, Tremor, Trouble walking and Weakness. Psychiatric Not Present- Anxiety, Bipolar, Change in Sleep Pattern, Depression, Fearful and Frequent crying. Endocrine Not Present-  Cold Intolerance, Excessive Hunger, Hair Changes, Heat Intolerance, Hot flashes and New Diabetes. Hematology Present- Easy Bruising. Not Present- Excessive bleeding, Gland problems, HIV and Persistent Infections.  Vitals (Pam [name] CMA; 11/03/2015 9:08 AM) 11/03/2015 9:08 AM Weight: 124 lb Height: 63in Body Surface Area: 1.58 m Body Mass Index: 21.97 kg/m  Temp.: 98F(Temporal)  Pulse: 79 (Regular)  BP: 128/80 (Sitting, Left Arm, Standard)       Physical Exam (Paeton Studer B. Hassell Done MD; 11/03/2015 9:58 AM) The physical exam findings are as follows: Note:HEENT unremarkable Neck without bruits Chest clear Heart SR without murmurs Abdomen nontender  Female Genitourinary Note: There is a squishy small right inguinal hernia probably indirect. nontender     Assessment & Plan Rodman Key B. Hassell Done MD; 11/03/2015 9:59 AM) RIGHT INGUINAL HERNIA (K40.90) Impression: Plan laparoscopy and open RIH --rule out femoral hernia Matt B. Hassell Done, MD, FACS

## 2015-11-18 DIAGNOSIS — M170 Bilateral primary osteoarthritis of knee: Secondary | ICD-10-CM | POA: Diagnosis not present

## 2015-12-12 ENCOUNTER — Encounter (HOSPITAL_COMMUNITY): Payer: Self-pay

## 2015-12-12 ENCOUNTER — Encounter (HOSPITAL_COMMUNITY)
Admission: RE | Admit: 2015-12-12 | Discharge: 2015-12-12 | Disposition: A | Discharge status: Home / Self Care | Payer: Medicare Other | Source: Ambulatory Visit | Attending: Surgery | Admitting: Surgery

## 2015-12-12 DIAGNOSIS — Z87891 Personal history of nicotine dependence: Secondary | ICD-10-CM | POA: Diagnosis not present

## 2015-12-12 DIAGNOSIS — N3942 Incontinence without sensory awareness: Secondary | ICD-10-CM | POA: Diagnosis not present

## 2015-12-12 DIAGNOSIS — K4090 Unilateral inguinal hernia, without obstruction or gangrene, not specified as recurrent: Secondary | ICD-10-CM | POA: Diagnosis not present

## 2015-12-12 DIAGNOSIS — Z8541 Personal history of malignant neoplasm of cervix uteri: Secondary | ICD-10-CM | POA: Diagnosis not present

## 2015-12-12 DIAGNOSIS — R338 Other retention of urine: Secondary | ICD-10-CM | POA: Diagnosis not present

## 2015-12-12 DIAGNOSIS — Z90710 Acquired absence of both cervix and uterus: Secondary | ICD-10-CM | POA: Diagnosis not present

## 2015-12-12 DIAGNOSIS — Z0000 Encounter for general adult medical examination without abnormal findings: Secondary | ICD-10-CM | POA: Diagnosis not present

## 2015-12-12 DIAGNOSIS — Z79899 Other long term (current) drug therapy: Secondary | ICD-10-CM | POA: Diagnosis not present

## 2015-12-12 DIAGNOSIS — Z853 Personal history of malignant neoplasm of breast: Secondary | ICD-10-CM | POA: Diagnosis not present

## 2015-12-12 DIAGNOSIS — Z9012 Acquired absence of left breast and nipple: Secondary | ICD-10-CM | POA: Diagnosis not present

## 2015-12-12 HISTORY — DX: Retention of urine, unspecified: R33.9

## 2015-12-12 LAB — CBC
HEMATOCRIT: 39.9 % (ref 36.0–46.0)
Hemoglobin: 13.1 g/dL (ref 12.0–15.0)
MCH: 28.7 pg (ref 26.0–34.0)
MCHC: 32.8 g/dL (ref 30.0–36.0)
MCV: 87.5 fL (ref 78.0–100.0)
PLATELETS: 296 10*3/uL (ref 150–400)
RBC: 4.56 MIL/uL (ref 3.87–5.11)
RDW: 13.5 % (ref 11.5–15.5)
WBC: 8.7 10*3/uL (ref 4.0–10.5)

## 2015-12-12 NOTE — Pre-Procedure Instructions (Signed)
I called Pam [name] at [facility] to request Dr. Hassell Done consider an indwelling catheter for surgery and PACU. Pt has urinary retention and in and out caths herself every 3 hours. Pt's daughter is concerned her mother will get UTI if bladder isn''t emptied per catheter.

## 2015-12-12 NOTE — Patient Instructions (Signed)
[place]  12/12/2015   Your procedure is scheduled on: 12/14/15  Report to [facility] Main  Entrance take [facility]  elevators to 3rd floor to [place] at 10:45 AM.  Call this number if you have problems the morning of surgery [phone]   Remember: ONLY 1 PERSON MAY GO WITH YOU TO SHORT STAY TO GET  READY MORNING OF [place].  Do not eat food or drink liquids :After Midnight Tuesday     Take these medicines the morning of surgery with A SIP OF WATER: Allegra                                              Do not wear jewelry, make-up, lotions, powders or perfumes             Do not wear nail polish.  Do not shave  48 hours prior to surgery.                Do not bring valuables to the hospital. [place].  Contacts, dentures or bridgework may not be worn into surgery.       Patients discharged the day of surgery will not be allowed to drive home.  Name and phone number of your driver: [name]           _____________________________________________________________________             [facility] - Preparing for Surgery Before surgery, you can play an important role.  Because skin is not sterile, your skin needs to be as free of germs as possible.  You can reduce the number of germs on your skin by washing with CHG (chlorahexidine gluconate) soap before surgery.  CHG is an antiseptic cleaner which kills germs and bonds with the skin to continue killing germs even after washing. Please DO NOT use if you have an allergy to CHG or antibacterial soaps.  If your skin becomes reddened/irritated stop using the CHG and inform your nurse when you arrive at Short Stay. Do not shave (including legs and underarms) for at least 48 hours prior to the first CHG shower.  You may shave your face/neck. Please follow these instructions carefully:  1.  Shower with CHG Soap the night before surgery and the   morning of Surgery.  2.  If you choose to wash your hair, wash your hair first as usual with your  normal  shampoo.  3.  After you shampoo, rinse your hair and body thoroughly to remove the  shampoo.                           4.  Use CHG as you would any other liquid soap.  You can apply chg directly  to the skin and wash                       Gently with a scrungie or clean washcloth.  5.  Apply the CHG Soap to your body ONLY FROM THE NECK DOWN.   Do not use on face/ open  Wound or open sores. Avoid contact with eyes, ears mouth and genitals (private parts).                       Wash face,  Genitals (private parts) with your normal soap.             6.  Wash thoroughly, paying special attention to the area where your surgery  will be performed.  7.  Thoroughly rinse your body with warm water from the neck down.  8.  DO NOT shower/wash with your normal soap after using and rinsing off  the CHG Soap.                9.  Pat yourself dry with a clean towel.            10.  Wear clean pajamas.            11.  Place clean sheets on your bed the night of your first shower and do not  sleep with pets. Day of Surgery : Do not apply any lotions/deodorants the morning of surgery.  Please wear clean clothes to the hospital/surgery center.  FAILURE TO FOLLOW THESE INSTRUCTIONS MAY RESULT IN THE CANCELLATION OF YOUR SURGERY PATIENT SIGNATURE_________________________________  NURSE SIGNATURE__________________________________

## 2015-12-14 ENCOUNTER — Encounter (HOSPITAL_COMMUNITY)
Admission: RE | Disposition: A | Discharge status: Home / Self Care | Payer: Self-pay | Source: Ambulatory Visit | Attending: Surgery

## 2015-12-14 ENCOUNTER — Ambulatory Visit (HOSPITAL_COMMUNITY): Payer: Medicare Other | Admitting: Anesthesiology

## 2015-12-14 ENCOUNTER — Encounter (HOSPITAL_COMMUNITY): Payer: Self-pay | Admitting: *Deleted

## 2015-12-14 ENCOUNTER — Ambulatory Visit (HOSPITAL_COMMUNITY)
Admission: RE | Admit: 2015-12-14 | Discharge: 2015-12-14 | Disposition: A | Discharge status: Home / Self Care | Payer: Medicare Other | Source: Ambulatory Visit | Attending: Surgery | Admitting: Surgery

## 2015-12-14 DIAGNOSIS — Z853 Personal history of malignant neoplasm of breast: Secondary | ICD-10-CM | POA: Insufficient documentation

## 2015-12-14 DIAGNOSIS — Z9012 Acquired absence of left breast and nipple: Secondary | ICD-10-CM | POA: Insufficient documentation

## 2015-12-14 DIAGNOSIS — Z90710 Acquired absence of both cervix and uterus: Secondary | ICD-10-CM | POA: Insufficient documentation

## 2015-12-14 DIAGNOSIS — Z87891 Personal history of nicotine dependence: Secondary | ICD-10-CM | POA: Insufficient documentation

## 2015-12-14 DIAGNOSIS — Z79899 Other long term (current) drug therapy: Secondary | ICD-10-CM | POA: Insufficient documentation

## 2015-12-14 DIAGNOSIS — K4090 Unilateral inguinal hernia, without obstruction or gangrene, not specified as recurrent: Secondary | ICD-10-CM | POA: Diagnosis not present

## 2015-12-14 DIAGNOSIS — Z8541 Personal history of malignant neoplasm of cervix uteri: Secondary | ICD-10-CM | POA: Diagnosis not present

## 2015-12-14 HISTORY — PX: INGUINAL HERNIA REPAIR: SHX194

## 2015-12-14 SURGERY — REPAIR, HERNIA, INGUINAL, LAPAROSCOPIC
Anesthesia: General | Laterality: Right

## 2015-12-14 MED ORDER — BUPIVACAINE LIPOSOME 1.3 % IJ SUSP
20.0000 mL | Freq: Once | INTRAMUSCULAR | Status: AC
  Administered 2015-12-14: 20 mL
  Filled 2015-12-14: qty 20

## 2015-12-14 MED ORDER — ACETAMINOPHEN 325 MG PO TABS: 650.0000 mg | ORAL_TABLET | ORAL | Status: DC | PRN

## 2015-12-14 MED ORDER — FENTANYL CITRATE (PF) 250 MCG/5ML IJ SOLN
INTRAMUSCULAR | Status: AC
  Filled 2015-12-14: qty 5

## 2015-12-14 MED ORDER — ROCURONIUM BROMIDE 100 MG/10ML IV SOLN
INTRAVENOUS | Status: DC | PRN
  Administered 2015-12-14: 10 mg via INTRAVENOUS
  Administered 2015-12-14: 30 mg via INTRAVENOUS
  Administered 2015-12-14: 10 mg via INTRAVENOUS

## 2015-12-14 MED ORDER — DEXAMETHASONE SODIUM PHOSPHATE 10 MG/ML IJ SOLN
INTRAMUSCULAR | Status: AC
  Filled 2015-12-14: qty 1

## 2015-12-14 MED ORDER — ROCURONIUM BROMIDE 50 MG/5ML IV SOLN
INTRAVENOUS | Status: AC
  Filled 2015-12-14: qty 1

## 2015-12-14 MED ORDER — CHLORHEXIDINE GLUCONATE 4 % EX LIQD: 1.0000 "application " | Freq: Once | CUTANEOUS | Status: DC

## 2015-12-14 MED ORDER — EPHEDRINE SULFATE 50 MG/ML IJ SOLN
INTRAMUSCULAR | Status: DC | PRN
  Administered 2015-12-14: 5 mg via INTRAVENOUS

## 2015-12-14 MED ORDER — CEFAZOLIN SODIUM-DEXTROSE 2-4 GM/100ML-% IV SOLN
INTRAVENOUS | Status: AC
  Filled 2015-12-14: qty 100

## 2015-12-14 MED ORDER — ACETAMINOPHEN 650 MG RE SUPP
650.0000 mg | RECTAL | Status: DC | PRN
  Filled 2015-12-14: qty 1

## 2015-12-14 MED ORDER — SODIUM CHLORIDE 0.9% FLUSH: 3.0000 mL | INTRAVENOUS | Status: DC | PRN

## 2015-12-14 MED ORDER — LIDOCAINE HCL (CARDIAC) 20 MG/ML IV SOLN
INTRAVENOUS | Status: DC | PRN
  Administered 2015-12-14: 50 mg via INTRAVENOUS

## 2015-12-14 MED ORDER — CEFAZOLIN SODIUM-DEXTROSE 2-4 GM/100ML-% IV SOLN
2.0000 g | INTRAVENOUS | Status: AC
  Administered 2015-12-14: 2 g via INTRAVENOUS
  Filled 2015-12-14: qty 100

## 2015-12-14 MED ORDER — LACTATED RINGERS IV SOLN
INTRAVENOUS | Status: DC | PRN
  Administered 2015-12-14 (×2): via INTRAVENOUS

## 2015-12-14 MED ORDER — FENTANYL CITRATE (PF) 100 MCG/2ML IJ SOLN
INTRAMUSCULAR | Status: AC
  Filled 2015-12-14: qty 2

## 2015-12-14 MED ORDER — PROPOFOL 10 MG/ML IV BOLUS
INTRAVENOUS | Status: AC
  Filled 2015-12-14: qty 20

## 2015-12-14 MED ORDER — LACTATED RINGERS IV SOLN
INTRAVENOUS | Status: DC
  Administered 2015-12-14: 1000 mL via INTRAVENOUS

## 2015-12-14 MED ORDER — LACTATED RINGERS IV SOLN: INTRAVENOUS | Status: DC

## 2015-12-14 MED ORDER — ONDANSETRON HCL 4 MG/2ML IJ SOLN
INTRAMUSCULAR | Status: AC
  Filled 2015-12-14: qty 2

## 2015-12-14 MED ORDER — OXYCODONE HCL 5 MG PO TABS
5.0000 mg | ORAL_TABLET | ORAL | Status: DC | PRN
  Administered 2015-12-14: 5 mg via ORAL
  Filled 2015-12-14: qty 1

## 2015-12-14 MED ORDER — PROPOFOL 10 MG/ML IV BOLUS
INTRAVENOUS | Status: DC | PRN
  Administered 2015-12-14: 170 mg via INTRAVENOUS

## 2015-12-14 MED ORDER — 0.9 % SODIUM CHLORIDE (POUR BTL) OPTIME
TOPICAL | Status: DC | PRN
  Administered 2015-12-14: 1000 mL

## 2015-12-14 MED ORDER — ONDANSETRON HCL 4 MG/2ML IJ SOLN
INTRAMUSCULAR | Status: DC | PRN
  Administered 2015-12-14: 4 mg via INTRAVENOUS

## 2015-12-14 MED ORDER — SUGAMMADEX SODIUM 200 MG/2ML IV SOLN
INTRAVENOUS | Status: AC
  Filled 2015-12-14: qty 2

## 2015-12-14 MED ORDER — HEPARIN SODIUM (PORCINE) 5000 UNIT/ML IJ SOLN
5000.0000 [IU] | Freq: Once | INTRAMUSCULAR | Status: AC
  Administered 2015-12-14: 5000 [IU] via SUBCUTANEOUS
  Filled 2015-12-14: qty 1

## 2015-12-14 MED ORDER — FENTANYL CITRATE (PF) 100 MCG/2ML IJ SOLN
INTRAMUSCULAR | Status: DC | PRN
  Administered 2015-12-14: 50 ug via INTRAVENOUS
  Administered 2015-12-14: 125 ug via INTRAVENOUS
  Administered 2015-12-14: 75 ug via INTRAVENOUS

## 2015-12-14 MED ORDER — PROCHLORPERAZINE EDISYLATE 5 MG/ML IJ SOLN: 10.0000 mg | Freq: Once | INTRAMUSCULAR | Status: DC | PRN

## 2015-12-14 MED ORDER — SUGAMMADEX SODIUM 200 MG/2ML IV SOLN
INTRAVENOUS | Status: DC | PRN
  Administered 2015-12-14: 200 mg via INTRAVENOUS

## 2015-12-14 MED ORDER — SODIUM CHLORIDE 0.9% FLUSH: 3.0000 mL | Freq: Two times a day (BID) | INTRAVENOUS | Status: DC

## 2015-12-14 MED ORDER — LIDOCAINE HCL (CARDIAC) 20 MG/ML IV SOLN
INTRAVENOUS | Status: AC
  Filled 2015-12-14: qty 5

## 2015-12-14 MED ORDER — SODIUM CHLORIDE 0.9 % IV SOLN: 250.0000 mL | INTRAVENOUS | Status: DC | PRN

## 2015-12-14 MED ORDER — FENTANYL CITRATE (PF) 100 MCG/2ML IJ SOLN
25.0000 ug | INTRAMUSCULAR | Status: DC | PRN
  Administered 2015-12-14: 50 ug via INTRAVENOUS

## 2015-12-14 MED ORDER — HYDROMORPHONE HCL 1 MG/ML IJ SOLN: 0.2500 mg | INTRAMUSCULAR | Status: DC | PRN

## 2015-12-14 MED ORDER — DEXAMETHASONE SODIUM PHOSPHATE 10 MG/ML IJ SOLN
INTRAMUSCULAR | Status: DC | PRN
  Administered 2015-12-14: 10 mg via INTRAVENOUS

## 2015-12-14 MED ORDER — HYDROCODONE-ACETAMINOPHEN 5-325 MG PO TABS: 1.0000 | ORAL_TABLET | ORAL | Status: DC | PRN

## 2015-12-14 SURGICAL SUPPLY — 46 items
APL SKNCLS STERI-STRIP NONHPOA (GAUZE/BANDAGES/DRESSINGS) ×1
BENZOIN TINCTURE PRP APPL 2/3 (GAUZE/BANDAGES/DRESSINGS) ×3 IMPLANT
CABLE HIGH FREQUENCY MONO STRZ (ELECTRODE) ×3 IMPLANT
CLOSURE WOUND 1/2 X4 (GAUZE/BANDAGES/DRESSINGS) ×1
COVER SURGICAL LIGHT HANDLE (MISCELLANEOUS) ×6 IMPLANT
DECANTER SPIKE VIAL GLASS SM (MISCELLANEOUS) ×3 IMPLANT
DEVICE SECURE STRAP 25 ABSORB (INSTRUMENTS) IMPLANT
DEVICE TROCAR PUNCTURE CLOSURE (ENDOMECHANICALS) ×2 IMPLANT
DISSECT BALLN SPACEMKR + OVL (BALLOONS) ×3
DISSECTOR BALLN SPACEMKR + OVL (BALLOONS) ×1 IMPLANT
DISSECTOR BLUNT TIP ENDO 5MM (MISCELLANEOUS) IMPLANT
DRAPE LAPAROSCOPIC ABDOMINAL (DRAPES) ×3 IMPLANT
DRSG TEGADERM 2-3/8X2-3/4 SM (GAUZE/BANDAGES/DRESSINGS) IMPLANT
ELECT PENCIL ROCKER SW 15FT (MISCELLANEOUS) ×3 IMPLANT
ELECT REM PT RETURN 9FT ADLT (ELECTROSURGICAL) ×3
ELECTRODE REM PT RTRN 9FT ADLT (ELECTROSURGICAL) ×1 IMPLANT
GLOVE BIOGEL M 8.0 STRL (GLOVE) ×3 IMPLANT
GLOVE BIOGEL PI IND STRL 7.0 (GLOVE) ×1 IMPLANT
GLOVE BIOGEL PI INDICATOR 7.0 (GLOVE) ×2
GOWN SPEC L4 XLG W/TWL (GOWN DISPOSABLE) ×3 IMPLANT
GOWN STRL REUS W/TWL LRG LVL3 (GOWN DISPOSABLE) ×3 IMPLANT
GOWN STRL REUS W/TWL XL LVL3 (GOWN DISPOSABLE) ×9 IMPLANT
HANDLE STAPLE EGIA 4 XL (STAPLE) ×2 IMPLANT
KIT BASIN OR (CUSTOM PROCEDURE TRAY) ×3 IMPLANT
MARKER SKIN DUAL TIP RULER LAB (MISCELLANEOUS) ×3 IMPLANT
NEEDLE INSUFFLATION 14GA 120MM (NEEDLE) IMPLANT
NS IRRIG 1000ML POUR BTL (IV SOLUTION) ×3 IMPLANT
PAD POSITIONING PINK XL (MISCELLANEOUS) IMPLANT
POSITIONER SURGICAL ARM (MISCELLANEOUS) IMPLANT
RELOAD TRI 60 ART MED THCK PUR (STAPLE) ×2 IMPLANT
SCISSORS LAP 5X35 DISP (ENDOMECHANICALS) IMPLANT
SET IRRIG TUBING LAPAROSCOPIC (IRRIGATION / IRRIGATOR) ×3 IMPLANT
SOLUTION ANTI FOG 6CC (MISCELLANEOUS) ×3 IMPLANT
STRIP CLOSURE SKIN 1/2X4 (GAUZE/BANDAGES/DRESSINGS) ×2 IMPLANT
SUT PROLENE 0 CT 1 CR/8 (SUTURE) ×3 IMPLANT
SUT VIC AB 3-0 SH 27 (SUTURE)
SUT VIC AB 3-0 SH 27XBRD (SUTURE) IMPLANT
SUT VIC AB 4-0 SH 18 (SUTURE) ×3 IMPLANT
SYR 30ML LL (SYRINGE) ×3 IMPLANT
TACKER 5MM HERNIA 3.5CML NAB (ENDOMECHANICALS) ×3 IMPLANT
TAPE CLOTH 4X10 WHT NS (GAUZE/BANDAGES/DRESSINGS) IMPLANT
TRAY FOLEY W/METER SILVER 14FR (SET/KITS/TRAYS/PACK) ×3 IMPLANT
TRAY FOLEY W/METER SILVER 16FR (SET/KITS/TRAYS/PACK) ×3 IMPLANT
TRAY LAPAROSCOPIC (CUSTOM PROCEDURE TRAY) ×3 IMPLANT
TROCAR BLADELESS OPT 5 75 (ENDOMECHANICALS) ×6 IMPLANT
TUBING INSUF HEATED (TUBING) ×6 IMPLANT

## 2015-12-14 NOTE — Interval H&P Note (Signed)
History and Physical Interval Note:  12/14/2015 12:46 PM  Pam [name]  has presented today for surgery, with the diagnosis of symptomatic right inguinal hernia  The various methods of treatment have been discussed with the patient and family. After consideration of risks, benefits and other options for treatment, the patient has consented to  Procedure(s): LAPAROSCOPIC AND OPEN RIGHT  INGUINAL HERNIA (Right) as a surgical intervention .  The patient's history has been reviewed, patient examined, no change in status, stable for surgery.  I have reviewed the patient's chart and labs.  Questions were answered to the patient's satisfaction.     Carles Florea B

## 2015-12-14 NOTE — Anesthesia Procedure Notes (Signed)
Procedure Name: Intubation Date/Time: 12/14/2015 1:52 PM Performed by: Saivion Goettel, Virgel Gess Pre-anesthesia Checklist: Patient identified, Emergency Drugs available, Suction available, Patient being monitored and Timeout performed Patient Re-evaluated:Patient Re-evaluated prior to inductionOxygen Delivery Method: Circle system utilized Preoxygenation: Pre-oxygenation with 100% oxygen Intubation Type: IV induction Ventilation: Mask ventilation without difficulty Laryngoscope Size: Mac and 3 Grade View: Grade I Tube type: Oral Tube size: 7.5 mm Number of attempts: 1 Airway Equipment and Method: Stylet Placement Confirmation: ETT inserted through vocal cords under direct vision,  positive ETCO2,  CO2 detector and breath sounds checked- equal and bilateral Secured at: 21 cm Tube secured with: Tape Dental Injury: Teeth and Oropharynx as per pre-operative assessment

## 2015-12-14 NOTE — Transfer of Care (Signed)
Immediate Anesthesia Transfer of Care Note  Patient: Pam [name]  Procedure(s) Performed: Procedure(s): LAPAROSCOPIC AND OPEN RIGHT  INGUINAL HERNIA (Right)  Patient Location: PACU  Anesthesia Type:General  Level of Consciousness:  sedated, patient cooperative and responds to stimulation  Airway & Oxygen Therapy:Patient Spontanous Breathing and Patient connected to face mask oxgen  Post-op Assessment:  Report given to PACU RN and Post -op Vital signs reviewed and stable  Post vital signs:  Reviewed and stable  Last Vitals: There were no vitals filed for this visit.  Complications: No apparent anesthesia complications

## 2015-12-14 NOTE — Op Note (Signed)
Surgeon: Kaylyn Lim, MD, FACS  Asst:  None  Anes:  Gen.  Procedure: Lap-assisted right inguinal hernia repair  Diagnosis: Right indirect inguinal hernia   Complications: None  EBL:   Minimal cc  Drains: None  Description of Procedure:  The patient was taken to OR 1 at [facility].  After anesthesia was administered and the patient was prepped a timeout was performed.  Access to the abdomen was achieved to the left upper quadrant with a 5 mm Optiview. The hernia was readily visualized in the right lower quadrant and did appear to be an indirect inguinal and not a femoral hernia. I elected to make a small incision over the bulge and then dissected out the sac. I pulled up the sac and transected it with a 6 cm Covidien purple load Endo GIA. My finger in place I felt the edges of the fascia and approximated those with interrupted 0 Prolenes. This obliterated the defect and I then injected this with Exparel.  I closed the incision with 4-0 Vicryl. Decompress the abdomen and close her left upper quadrant with 4-0 Vicryl as well.  The patient tolerated the procedure well and was taken to the PACU in stable condition.     Matt B. Hassell Done, [place], [facility], [place]

## 2015-12-14 NOTE — Anesthesia Postprocedure Evaluation (Signed)
Anesthesia Post Note  Patient: Pam [name]  Procedure(s) Performed: Procedure(s) (LRB): LAPAROSCOPIC AND OPEN RIGHT  INGUINAL HERNIA (Right)  Patient location during evaluation: PACU Anesthesia Type: General Level of consciousness: awake and alert Pain management: pain level controlled Vital Signs Assessment: post-procedure vital signs reviewed and stable Respiratory status: spontaneous breathing, nonlabored ventilation, respiratory function stable and patient connected to nasal cannula oxygen Cardiovascular status: blood pressure returned to baseline and stable Postop Assessment: no signs of nausea or vomiting Anesthetic complications: no    Last Vitals:  Filed Vitals:   12/14/15 1523 12/14/15 1534  BP: 142/72 112/80  Pulse: 80 80  Temp: 36.5 C 36.7 C  Resp: 21 20    Last Pain:  Filed Vitals:   12/14/15 1543  PainSc: 5                  Cythia Bachtel J

## 2015-12-14 NOTE — Progress Notes (Signed)
Pt states she self catheterizes at home every 3 hours, gave pt an in and out catheter kit and went in bathroom with pt and she self catheterized herself and got a large amount of clear urine out.  Pt states she has done the self catheterizing since September of last year.

## 2015-12-14 NOTE — H&P (Signed)
Pam [name] 11/03/2015 9:08 AM Location: [facility] Patient #: [id number] DOB: August 30, 1940 Married / Language: English / Race: White Female   History of Present Illness Pam [name] B. Hassell Done MD; 11/03/2015 9:57 AM) Patient words: hernia.  The patient is a 75 year old female who presents with an inguinal hernia. Pam [name] comes in with her daughter with a symptomatic right inguinal hernia. 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   She has not had any episodes of bowel obstruction or incarceration. I explained lap and open repair of this inguinal hernia. On exam, I believe that it is an indirect hernia but she does have leg pain with it so I need to rule out a right femoral component. Therefore I will place a laparoscope and exam and then perform an open repair. She is aware of the risks or recurrence and injury.    Other Problems Marjean Donna, CMA; 11/03/2015 9:08 AM) Arthritis Bladder Problems Breast Cancer Cervical Cancer Gastroesophageal Reflux Disease Oophorectomy  Past Surgical History Marjean Donna, CMA; 11/03/2015 9:08 AM) Hysterectomy (due to cancer) - Complete Knee Surgery Left. Mastectomy Left.  Diagnostic Studies History Marjean Donna, CMA; 11/03/2015 9:08 AM) Colonoscopy 5-10 years ago Mammogram within last year Pap Smear 1-5 years ago  Allergies Davy Pique Bynum, CMA; 11/03/2015 9:09 AM) Aspirin *ANALGESICS - NonNarcotic* Ciprofloxacin *CHEMICALS* Flagyl *ANTI-INFECTIVE AGENTS - MISC.* PenicillAMINE *Assorted Classes**  Medication History (Sonya Bynum, CMA; 11/03/2015 9:10 AM) Calcium "900" w/D (Oral) Active. Vitamin D (Cholecalciferol) (1000UNIT Capsule, Oral) Active. Voltaren (75MG  Tablet DR, Oral) Active. Allegra (180MG  Tablet, Oral) Active. Medications Reconciled  Social History Marjean Donna, CMA; 11/03/2015 9:08 AM) Alcohol use Remotely quit alcohol use. Caffeine use Carbonated beverages, Tea. No drug use Tobacco use Former smoker.  Family History Marjean Donna, [place]; 11/03/2015 9:08 AM) Arthritis Father. Breast Cancer Sister. Colon Polyps Sister. Heart Disease Brother, Father, Mother. Migraine Headache Daughter. Prostate Cancer Father. Respiratory Condition Sister. Thyroid problems Daughter.  Pregnancy / Birth History Marjean Donna, [place]; 11/03/2015 9:08 AM) Age at menarche 70 years. Age of menopause 65-55 Gravida 1 Maternal age 68-25 Para 1    Review of Systems ([place]; 11/03/2015 9:08 AM) General Not Present- Appetite Loss, Chills, Fatigue, Fever, Night Sweats, Weight Gain and Weight Loss. Skin Not Present- Change in Wart/Mole, Dryness, Hives, Jaundice, New Lesions, Non-Healing Wounds, Rash and Ulcer. HEENT Present- Wears glasses/contact lenses. Not Present- Earache, Hearing Loss, Hoarseness, Nose Bleed, Oral Ulcers, Ringing in the Ears, Seasonal Allergies, Sinus Pain, Sore Throat, Visual Disturbances and Yellow Eyes. Respiratory Not Present- Bloody sputum, Chronic Cough, Difficulty Breathing, Snoring and Wheezing. Breast Not Present- Breast Mass, Breast Pain, Nipple Discharge and Skin Changes. Cardiovascular Present- Leg Cramps and Swelling of Extremities. Not Present- Chest Pain, Difficulty Breathing Lying Down, Palpitations, Rapid Heart Rate and Shortness of Breath. Gastrointestinal Not Present- Abdominal Pain, Bloating, Bloody Stool, Change in Bowel Habits, Chronic diarrhea, Constipation, Difficulty Swallowing, Excessive gas, Gets full quickly at meals, Hemorrhoids, Indigestion, Nausea, Rectal Pain and Vomiting. Female Genitourinary Present- Nocturia. Not Present- Frequency, Painful Urination, Pelvic Pain and Urgency. Musculoskeletal Present- Joint Pain. Not Present- Back Pain, Joint Stiffness, Muscle Pain, Muscle Weakness and Swelling of  Extremities. Neurological Not Present- Decreased Memory, Fainting, Headaches, Numbness, Seizures, Tingling, Tremor, Trouble walking and Weakness. Psychiatric Not Present- Anxiety, Bipolar, Change in Sleep Pattern, Depression, Fearful and Frequent crying. Endocrine Not Present-  Cold Intolerance, Excessive Hunger, Hair Changes, Heat Intolerance, Hot flashes and New Diabetes. Hematology Present- Easy Bruising. Not Present- Excessive bleeding, Gland problems, HIV and Persistent Infections.  Vitals (Sonya Bynum CMA; 11/03/2015 9:08 AM) 11/03/2015 9:08 AM Weight: 124 lb Height: 63in Body Surface Area: 1.58 m Body Mass Index: 21.97 kg/m  Temp.: 28F(Temporal)  Pulse: 79 (Regular)  BP: 128/80 (Sitting, Left Arm, Standard)       Physical Exam (Issa Kosmicki B. Hassell Done MD; 11/03/2015 9:58 AM) The physical exam findings are as follows: Note:HEENT unremarkable Neck without bruits Chest clear Heart SR without murmurs Abdomen nontender

## 2015-12-14 NOTE — Discharge Instructions (Signed)
Hernia, Adult A hernia is the bulging of an organ or tissue through a weak spot in the muscles of the abdomen (abdominal wall). Hernias develop most often near the navel or groin. There are many kinds of hernias. Common kinds include:  Femoral hernia. This kind of hernia develops under the groin in the upper thigh area.  Inguinal hernia. This kind of hernia develops in the groin or scrotum.  Umbilical hernia. This kind of hernia develops near the navel.  Hiatal hernia. This kind of hernia causes part of the stomach to be pushed up into the chest.  Incisional hernia. This kind of hernia bulges through a scar from an abdominal surgery. CAUSES This condition may be caused by:  Heavy lifting.  Coughing over a long period of time.  Straining to have a bowel movement.  An incision made during an abdominal surgery.  A birth defect (congenital defect).  Excess weight or obesity.  Smoking.  Poor nutrition.  Cystic fibrosis.  Excess fluid in the abdomen.  Undescended testicles. SYMPTOMS Symptoms of a hernia include:  A lump on the abdomen. This is the first sign of a hernia. The lump may become more obvious with standing, straining, or coughing. It may get bigger over time if it is not treated or if the condition causing it is not treated.  Pain. A hernia is usually painless, but it may become painful over time if treatment is delayed. The pain is usually dull and may get worse with standing or lifting heavy objects. Sometimes a hernia gets tightly squeezed in the weak spot (strangulated) or stuck there (incarcerated) and causes additional symptoms. These symptoms may include:  Vomiting.  Nausea.  Constipation.  Irritability. DIAGNOSIS A hernia may be diagnosed with:  A physical exam. During the exam your health care provider may ask you to cough or to make a specific movement, because a hernia is usually more visible when you move.  Imaging tests. These can  include:  X-rays.  Ultrasound.  CT scan. TREATMENT A hernia that is small and painless may not need to be treated. A hernia that is large or painful may be treated with surgery. Inguinal hernias may be treated with surgery to prevent incarceration or strangulation. Strangulated hernias are always treated with surgery, because lack of blood to the trapped organ or tissue can cause it to die. Surgery to treat a hernia involves pushing the bulge back into place and repairing the weak part of the abdomen. HOME CARE INSTRUCTIONS  Avoid straining.  Do not lift anything heavier than 10 lb (4.5 kg).  Lift with your leg muscles, not your back muscles. This helps avoid strain.  When coughing, try to cough gently.  Prevent constipation. Constipation leads to straining with bowel movements, which can make a hernia worse or cause a hernia repair to break down. You can prevent constipation by:  Eating a high-fiber diet that includes plenty of fruits and vegetables.  Drinking enough fluids to keep your urine clear or pale yellow. Aim to drink 6-8 glasses of water per day.  Using a stool softener as directed by your health care provider.  Lose weight, if you are overweight.  Do not use any tobacco products, including cigarettes, chewing tobacco, or electronic cigarettes. If you need help quitting, ask your health care provider.  Keep all follow-up visits as directed by your health care provider. This is important. Your health care provider may need to monitor your condition. SEEK MEDICAL CARE IF:  You have  swelling, redness, and pain in the affected area.  Your bowel habits change. SEEK IMMEDIATE MEDICAL CARE IF:  You have a fever.  You have abdominal pain that is getting worse.  You feel nauseous or you vomit.  You cannot push the hernia back in place by gently pressing on it while you are lying down.  The hernia:  Changes in shape or size.  Is stuck outside the  abdomen.  Becomes discolored.  Feels hard or tender.   This information is not intended to replace advice given to you by your health care provider. Make sure you discuss any questions you have with your health care provider.   Document Released: 07/09/2005 Document Revised: 07/30/2014 Document Reviewed: 05/19/2014 Elsevier Interactive Patient Education 2016 [employer].   May shower in am

## 2015-12-14 NOTE — Anesthesia Preprocedure Evaluation (Addendum)
Anesthesia Evaluation  Patient identified by MRN, date of birth, ID band Patient awake    Reviewed: Allergy & Precautions, NPO status , Patient's Chart, lab work & pertinent test results  Airway Mallampati: II  TM Distance: >3 FB Neck ROM: Full    Dental  (+) Edentulous Lower, Edentulous Upper   Pulmonary former smoker,    Pulmonary exam normal breath sounds clear to auscultation       Cardiovascular negative cardio ROS Normal cardiovascular exam Rhythm:Regular Rate:Normal     Neuro/Psych negative neurological ROS  negative psych ROS   GI/Hepatic negative GI ROS, Neg liver ROS,   Endo/Other  negative endocrine ROS  Renal/GU negative Renal ROS  negative genitourinary   Musculoskeletal  (+) Arthritis ,   Abdominal   Peds negative pediatric ROS (+)  Hematology negative hematology ROS (+)   Anesthesia Other Findings   Reproductive/Obstetrics negative OB ROS                            Anesthesia Physical Anesthesia Plan  ASA: II  Anesthesia Plan: General   Post-op Pain Management:    Induction: Intravenous  Airway Management Planned: Oral ETT  Additional Equipment:   Intra-op Plan:   Post-operative Plan: Extubation in OR  Informed Consent: I have reviewed the patients History and Physical, chart, labs and discussed the procedure including the risks, benefits and alternatives for the proposed anesthesia with the patient or authorized representative who has indicated his/her understanding and acceptance.   Dental advisory given  Plan Discussed with: CRNA  Anesthesia Plan Comments:         Anesthesia Quick Evaluation

## 2016-01-02 ENCOUNTER — Ambulatory Visit: Payer: Medicare Other | Admitting: Gynecologic Oncology

## 2016-01-20 ENCOUNTER — Encounter: Payer: Self-pay | Admitting: Gynecologic Oncology

## 2016-01-20 ENCOUNTER — Ambulatory Visit: Payer: Medicare Other | Attending: Gynecologic Oncology | Admitting: Gynecologic Oncology

## 2016-01-20 VITALS — BP 156/65 | HR 67 | Temp 97.7°F | Resp 18 | Ht 63.0 in | Wt 129.5 lb

## 2016-01-20 DIAGNOSIS — Z853 Personal history of malignant neoplasm of breast: Secondary | ICD-10-CM | POA: Diagnosis not present

## 2016-01-20 DIAGNOSIS — M1990 Unspecified osteoarthritis, unspecified site: Secondary | ICD-10-CM | POA: Insufficient documentation

## 2016-01-20 DIAGNOSIS — R32 Unspecified urinary incontinence: Secondary | ICD-10-CM | POA: Insufficient documentation

## 2016-01-20 DIAGNOSIS — Z8541 Personal history of malignant neoplasm of cervix uteri: Secondary | ICD-10-CM | POA: Insufficient documentation

## 2016-01-20 DIAGNOSIS — Z803 Family history of malignant neoplasm of breast: Secondary | ICD-10-CM | POA: Insufficient documentation

## 2016-01-20 DIAGNOSIS — Z886 Allergy status to analgesic agent status: Secondary | ICD-10-CM | POA: Insufficient documentation

## 2016-01-20 DIAGNOSIS — K4090 Unilateral inguinal hernia, without obstruction or gangrene, not specified as recurrent: Secondary | ICD-10-CM | POA: Insufficient documentation

## 2016-01-20 DIAGNOSIS — Z9012 Acquired absence of left breast and nipple: Secondary | ICD-10-CM | POA: Insufficient documentation

## 2016-01-20 DIAGNOSIS — M109 Gout, unspecified: Secondary | ICD-10-CM | POA: Diagnosis not present

## 2016-01-20 DIAGNOSIS — N39490 Overflow incontinence: Secondary | ICD-10-CM | POA: Diagnosis not present

## 2016-01-20 DIAGNOSIS — Z87891 Personal history of nicotine dependence: Secondary | ICD-10-CM | POA: Insufficient documentation

## 2016-01-20 DIAGNOSIS — Z90722 Acquired absence of ovaries, bilateral: Secondary | ICD-10-CM | POA: Insufficient documentation

## 2016-01-20 DIAGNOSIS — Z90710 Acquired absence of both cervix and uterus: Secondary | ICD-10-CM | POA: Diagnosis not present

## 2016-01-20 DIAGNOSIS — E785 Hyperlipidemia, unspecified: Secondary | ICD-10-CM | POA: Insufficient documentation

## 2016-01-20 DIAGNOSIS — Z8042 Family history of malignant neoplasm of prostate: Secondary | ICD-10-CM | POA: Insufficient documentation

## 2016-01-20 DIAGNOSIS — Z881 Allergy status to other antibiotic agents status: Secondary | ICD-10-CM | POA: Diagnosis not present

## 2016-01-20 DIAGNOSIS — C539 Malignant neoplasm of cervix uteri, unspecified: Secondary | ICD-10-CM | POA: Diagnosis not present

## 2016-01-20 DIAGNOSIS — Z8249 Family history of ischemic heart disease and other diseases of the circulatory system: Secondary | ICD-10-CM | POA: Diagnosis not present

## 2016-01-20 DIAGNOSIS — Z9889 Other specified postprocedural states: Secondary | ICD-10-CM | POA: Diagnosis not present

## 2016-01-20 DIAGNOSIS — R339 Retention of urine, unspecified: Secondary | ICD-10-CM | POA: Diagnosis not present

## 2016-01-20 DIAGNOSIS — Z880 Allergy status to penicillin: Secondary | ICD-10-CM | POA: Diagnosis not present

## 2016-01-20 NOTE — Patient Instructions (Signed)
Plan to follow up with Dr. Rossi in three months or sooner if needed.  Please call for any questions or concerns. 

## 2016-01-20 NOTE — Progress Notes (Signed)
CERVICAL CANCER FOLLOWUP Assessment:    75 y.o. 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   S/p robotic radical hysterectomy, BSO and bilateral SLN biopsy on 04/07/15. No evidence of recurrence.  1/ Urinary incontinence (overflow) and bladder dysfunction (lack of sensation of voiding) Stable though not improving.   2/ right inguinal hernia: s/p repair with Dr Hassell Done  Plan: 1) cervical cancer: continue 3 monthly followup with annual vaginal cytology - next cytology in September 2017.  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 Continue care with Dr Matilde Sprang appreciate his assistance.  3) Surveillance: Return to clinic 3 months to evaluate for surveillance of cancer and pap smear  HPI:  Pam [name] is a 75 y.o. year old initially seen in consultation on 04/25/15 for cervical cancer, referred by Dr Benjie Karvonen.  She then underwent a robotic radical hysterectomy, BSO, bilateral pelvic sentinel lymph node biopsy on [id number] without complications. Preoperative PET scan on 03/30/15 was negative for metastatic disease Her postoperative course was complicated by the development of urinary retention and overflow incontinence. She required prolonged catheterization and then intermittent self catheterization teaching.  Her final pathology revealed a 4.5cm moderately differentiated SCC of the cervix which had no LVSI, but had deep cervical stromal invasion (11 of 70mm). She had negative margins, negative nodes and negative parametrium. Her closest margin was 76mm.  She was recommended adjuvant pelvic RT to decrease risk of recurrence, but declined this after counseling with Dr Sondra Come due to patient concern regarding toxicity.   She developed postoperative urinary retention likely secondary to the radical parametrial  dissection and interruption of hypogastric nerves from her radical procedure. She was encouraged to self cath every 2hours to keep herself dry (she would have incontinence if she left it longer).  The patient saw Dr Matilde Sprang and has been continuing to self cath every 2 - 3 hours. She spontaneously voids only a little (50cc). She has some acceptance with this (less frustration).  Interval Hx.  In May, 2017 Dr Hassell Done from [place] repaired her right indirect hernia. This was an uncomplicated surgery, and Felice is doing well. She denies vaginal bleeding. Her urinary retention is stable. She continues to see Dr McDiarmid for this.  Current Outpatient Prescriptions on File Prior to Visit  Medication Sig Dispense Refill  . Calcium Carbonate (CALCIUM 600 PO) Take 1 tablet by mouth daily.    . Cholecalciferol (VITAMIN D PO) Take 1 capsule by mouth daily.    . diclofenac (VOLTAREN) 75 MG EC tablet Take 75 mg by mouth daily.    . fexofenadine (ALLEGRA) 180 MG tablet Take 180 mg by mouth daily.    Marland Kitchen OVER THE COUNTER MEDICATION Take 1 tablet by mouth daily. Move Free Ultra Triple Action for Joint Health    . UNABLE TO FIND In and out foley catheter 14 Fr. Q# 20   Refills: none Insert catheter every 2 hours to empty bladder. 1 each 1   No current facility-administered medications on file prior to visit.   Allergies  Allergen Reactions  . Aspirin Nausea And Vomiting  . Ciprofloxacin Diarrhea  . Flagyl [Metronidazole] Diarrhea  . Penicillins Hives and Other (See Comments)    Has patient had a PCN reaction causing immediate rash, facial/tongue/throat swelling, SOB or lightheadedness with hypotension: no Has patient had a PCN reaction causing severe rash involving mucus  membranes or skin necrosis: no Has patient had a PCN reaction that required hospitalization no Has patient had a PCN reaction occurring within the last 10 years: no If all of the above answers are "NO", then may proceed with Cephalosporin  use.    Past Medical History  Diagnosis Date  . Arthritis   . History of breast cancer 2006    Dr. Sonny Dandy oncologist - left breast  . Hyperlipidemia   . History of gout   . Cancer [facility])     BREAST CANCER / CERVICAL CANCER   . Cervical cancer ([place])   . Urinary retention Sept 2016    s/p hysterectomy   Past Surgical History  Procedure Laterality Date  . Mastectomy Left 2006  . Knee arthroscopy Left   . Tubal ligation    . Robotic assisted total hysterectomy with bilateral salpingo oopherectomy Bilateral 04/07/2015    Procedure: ROBOTIC ASSISTED RADICAL HYSTERECTOMY BILATERAL SALPINGO OOPHORECTOMY BILATERAL SENTINEL LYMPHADENETOMY;  Surgeon: Everitt Amber, MD;  Location: [facility];  Service: Gynecology;  Laterality: Bilateral;  . Inguinal hernia repair Right 12/14/2015    Procedure: LAPAROSCOPIC AND OPEN RIGHT  INGUINAL HERNIA;  Surgeon: Johnathan Hausen, MD;  Location: [facility];  Service: General;  Laterality: Right;   Family History  Problem Relation Age of Onset  . Prostate cancer Father   . Heart disease Father   . Breast cancer Sister   . Heart disease Mother    Social History   Social History  . Marital Status: Married    Spouse Name: N/A  . Number of Children: 1  . Years of Education: N/A   Occupational History  . works at [employer] in [employer]    Social History Main Topics  . Smoking status: Former Smoker -- 1.00 packs/day for 30 years    Types: Cigarettes    Quit date: 07/23/1994  . Smokeless tobacco: Never Used  . Alcohol Use: No  . Drug Use: No  . Sexual Activity: Not Currently   Other Topics Concern  . Not on file   Social History Narrative     Review of systems: Constitutional:  She has no weight gain or weight loss. She has no fever or chills. Eyes: No blurred vision Ears, Nose, Mouth, Throat: No dizziness, headaches or changes in hearing. No mouth sores. Cardiovascular: No chest pain, palpitations or edema. Respiratory:  No shortness of  breath, wheezing or cough Gastrointestinal: She has normal bowel movements without diarrhea or constipation. She denies any nausea or vomiting. She denies blood in her stool or heart burn. Genitourinary:  She denies pelvic pain, pelvic pressure or changes in her urinary function. She has no hematuria, dysuria, or incontinence. + urinary retention Musculoskeletal: Denies muscle weakness or joint pains Skin:  She has no skin changes, rashes or itching Neurological:  Denies dizziness or headaches. No neuropathy, no numbness or tingling. Psychiatric:  She denies depression or anxiety. Hematologic/Lymphatic:   No easy bruising or bleeding   Physical Exam: Blood pressure 156/65, pulse 67, temperature 97.7 F (36.5 C), temperature source Oral, resp. rate 18, height 5\' 3"  (1.6 m), weight 129 lb 8 oz (58.741 kg), SpO2 100 %. General: Well dressed, well nourished in no apparent distress.   HEENT:  Normocephalic and atraumatic, no lesions.  Extraocular muscles intact. Sclerae anicteric. Pupils equal, round, reactive. No mouth sores or ulcers. Thyroid is normal size, not nodular, midline. Skin:  No lesions or rashes. Abdomen:  Soft, nontender, nondistended.  No palpable masses.  No  hepatosplenomegaly.  No ascites. Normal bowel sounds. Incisions are well healed including hernia incision. Genitourinary: Normal EGBUS  No lesions. No bleeding or discharge.  No cul de sac fullness. Bladder not palpably distended with urine. Extremities: No cyanosis, clubbing or edema.  No calf tenderness or erythema. No palpable cords. Psychiatric: Mood and affect are appropriate. Neurological: Awake, alert and oriented x 3. Sensation is intact, no neuropathy.  Musculoskeletal: No pain, normal strength and range of motion.  Donaciano Eva, MD

## 2016-02-03 DIAGNOSIS — Z0000 Encounter for general adult medical examination without abnormal findings: Secondary | ICD-10-CM | POA: Diagnosis not present

## 2016-02-03 DIAGNOSIS — M170 Bilateral primary osteoarthritis of knee: Secondary | ICD-10-CM | POA: Diagnosis not present

## 2016-02-03 DIAGNOSIS — Z1389 Encounter for screening for other disorder: Secondary | ICD-10-CM | POA: Diagnosis not present

## 2016-02-04 DIAGNOSIS — Z23 Encounter for immunization: Secondary | ICD-10-CM | POA: Diagnosis not present

## 2016-02-15 DIAGNOSIS — Z1231 Encounter for screening mammogram for malignant neoplasm of breast: Secondary | ICD-10-CM | POA: Diagnosis not present

## 2016-02-15 DIAGNOSIS — Z853 Personal history of malignant neoplasm of breast: Secondary | ICD-10-CM | POA: Diagnosis not present

## 2016-03-22 DIAGNOSIS — M2550 Pain in unspecified joint: Secondary | ICD-10-CM | POA: Diagnosis not present

## 2016-03-22 DIAGNOSIS — Z79899 Other long term (current) drug therapy: Secondary | ICD-10-CM | POA: Diagnosis not present

## 2016-03-22 DIAGNOSIS — Z853 Personal history of malignant neoplasm of breast: Secondary | ICD-10-CM | POA: Diagnosis not present

## 2016-03-22 DIAGNOSIS — M810 Age-related osteoporosis without current pathological fracture: Secondary | ICD-10-CM | POA: Diagnosis not present

## 2016-03-22 DIAGNOSIS — Z8541 Personal history of malignant neoplasm of cervix uteri: Secondary | ICD-10-CM | POA: Diagnosis not present

## 2016-03-22 DIAGNOSIS — Z87891 Personal history of nicotine dependence: Secondary | ICD-10-CM | POA: Diagnosis not present

## 2016-03-22 DIAGNOSIS — Z9221 Personal history of antineoplastic chemotherapy: Secondary | ICD-10-CM | POA: Diagnosis not present

## 2016-03-22 DIAGNOSIS — Z780 Asymptomatic menopausal state: Secondary | ICD-10-CM | POA: Diagnosis not present

## 2016-03-22 DIAGNOSIS — Z8262 Family history of osteoporosis: Secondary | ICD-10-CM | POA: Diagnosis not present

## 2016-03-30 ENCOUNTER — Ambulatory Visit: Payer: Medicare Other | Attending: Gynecologic Oncology | Admitting: Gynecologic Oncology

## 2016-03-30 ENCOUNTER — Encounter: Payer: Self-pay | Admitting: Gynecologic Oncology

## 2016-03-30 ENCOUNTER — Other Ambulatory Visit (HOSPITAL_COMMUNITY)
Admission: RE | Admit: 2016-03-30 | Discharge: 2016-03-30 | Disposition: A | Discharge status: Home / Self Care | Payer: Medicare Other | Source: Ambulatory Visit | Attending: Gynecologic Oncology | Admitting: Gynecologic Oncology

## 2016-03-30 VITALS — BP 147/72 | HR 77 | Temp 97.7°F | Resp 18 | Ht 63.0 in | Wt 130.4 lb

## 2016-03-30 DIAGNOSIS — Z01411 Encounter for gynecological examination (general) (routine) with abnormal findings: Secondary | ICD-10-CM | POA: Insufficient documentation

## 2016-03-30 DIAGNOSIS — Z1151 Encounter for screening for human papillomavirus (HPV): Secondary | ICD-10-CM | POA: Diagnosis not present

## 2016-03-30 DIAGNOSIS — M1990 Unspecified osteoarthritis, unspecified site: Secondary | ICD-10-CM | POA: Diagnosis not present

## 2016-03-30 DIAGNOSIS — Z8249 Family history of ischemic heart disease and other diseases of the circulatory system: Secondary | ICD-10-CM | POA: Insufficient documentation

## 2016-03-30 DIAGNOSIS — Z90710 Acquired absence of both cervix and uterus: Secondary | ICD-10-CM | POA: Insufficient documentation

## 2016-03-30 DIAGNOSIS — Z853 Personal history of malignant neoplasm of breast: Secondary | ICD-10-CM | POA: Insufficient documentation

## 2016-03-30 DIAGNOSIS — N39490 Overflow incontinence: Secondary | ICD-10-CM

## 2016-03-30 DIAGNOSIS — C539 Malignant neoplasm of cervix uteri, unspecified: Secondary | ICD-10-CM

## 2016-03-30 DIAGNOSIS — M810 Age-related osteoporosis without current pathological fracture: Secondary | ICD-10-CM | POA: Diagnosis not present

## 2016-03-30 DIAGNOSIS — Z87891 Personal history of nicotine dependence: Secondary | ICD-10-CM | POA: Insufficient documentation

## 2016-03-30 DIAGNOSIS — R32 Unspecified urinary incontinence: Secondary | ICD-10-CM | POA: Diagnosis not present

## 2016-03-30 DIAGNOSIS — Z8541 Personal history of malignant neoplasm of cervix uteri: Secondary | ICD-10-CM

## 2016-03-30 DIAGNOSIS — Z9012 Acquired absence of left breast and nipple: Secondary | ICD-10-CM | POA: Insufficient documentation

## 2016-03-30 DIAGNOSIS — Z8042 Family history of malignant neoplasm of prostate: Secondary | ICD-10-CM | POA: Diagnosis not present

## 2016-03-30 DIAGNOSIS — N3942 Incontinence without sensory awareness: Secondary | ICD-10-CM

## 2016-03-30 DIAGNOSIS — E785 Hyperlipidemia, unspecified: Secondary | ICD-10-CM | POA: Diagnosis not present

## 2016-03-30 DIAGNOSIS — Z90722 Acquired absence of ovaries, bilateral: Secondary | ICD-10-CM | POA: Diagnosis not present

## 2016-03-30 DIAGNOSIS — Z803 Family history of malignant neoplasm of breast: Secondary | ICD-10-CM | POA: Insufficient documentation

## 2016-03-30 NOTE — Patient Instructions (Signed)
We have scheduled your 3 month follow up appointment with Dr Everitt Amber for July 02, 2016 . We will contact you with the results of your PAP results , please call with any changes , questions or concerns.  Thank you

## 2016-03-30 NOTE — Progress Notes (Signed)
CERVICAL CANCER FOLLOWUP Assessment:    75 y.o. 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   S/p robotic radical hysterectomy, BSO and bilateral SLN biopsy on 04/07/15. No evidence of recurrence.  1/ Urinary incontinence (overflow) and bladder dysfunction (lack of sensation of voiding) Stable though not improving. Sees Dr Matilde Sprang for this.  Plan: 1) cervical cancer: continue 3 monthly followup with annual vaginal cytology - next cytology in September 2018. Will notify patient of today's pap when resulted.  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 Continue care with Dr Matilde Sprang appreciate his assistance.  3) Surveillance: Return to clinic 3 months to evaluate for surveillance of cancer  HPI:  Pam [name] is a 75 y.o. year old initially seen in consultation on 04/25/15 for cervical cancer, referred by Dr Benjie Karvonen.  She then underwent a robotic radical hysterectomy, BSO, bilateral pelvic sentinel lymph node biopsy on [id number] without complications. Preoperative PET scan on 03/30/15 was negative for metastatic disease Her postoperative course was complicated by the development of urinary retention and overflow incontinence. She required prolonged catheterization and then intermittent self catheterization teaching.  Her final pathology revealed a 4.5cm moderately differentiated SCC of the cervix which had no LVSI, but had deep cervical stromal invasion (11 of 16mm). She had negative margins, negative nodes and negative parametrium. Her closest margin was 82mm.  She was recommended adjuvant pelvic RT to decrease risk of recurrence, but declined this after counseling with Dr Sondra Come due to patient concern regarding toxicity.   She developed postoperative urinary retention likely secondary to the radical  parametrial dissection and interruption of hypogastric nerves from her radical procedure. She was encouraged to self cath every 2hours to keep herself dry (she would have incontinence if she left it longer).  The patient was evaluated by Dr Matilde Sprang and has been continuing to self cath every 2 - 3 hours. She spontaneously voids only a little (50cc). She has some acceptance with this (less frustration).  In May, 2017 Dr Hassell Done from [place] repaired her right indirect hernia. This was an uncomplicated surgery.   Interval Hx.  She denies vaginal bleeding. Her urinary retention is stable but bothersome. She continues to see Dr McDiarmid for this.  Current Outpatient Prescriptions on File Prior to Visit  Medication Sig Dispense Refill  . Calcium Carbonate (CALCIUM 600 PO) Take 1 tablet by mouth daily.    . Cholecalciferol (VITAMIN D PO) Take 1 capsule by mouth daily.    . diclofenac (VOLTAREN) 75 MG EC tablet Take 75 mg by mouth daily.    . fexofenadine (ALLEGRA) 180 MG tablet Take 180 mg by mouth daily.    Marland Kitchen OVER THE COUNTER MEDICATION Take 1 tablet by mouth daily. Move Free Ultra Triple Action for Joint Health    . UNABLE TO FIND In and out foley catheter 14 Fr. Q# 20   Refills: none Insert catheter every 2 hours to empty bladder. 1 each 1   No current facility-administered medications on file prior to visit.    Allergies  Allergen Reactions  . Aspirin Nausea And Vomiting  . Ciprofloxacin Diarrhea  . Flagyl [Metronidazole] Diarrhea  . Penicillins Hives and Other (See Comments)    Has patient had a PCN reaction causing immediate rash, facial/tongue/throat swelling, SOB or lightheadedness with hypotension: no Has patient had a PCN reaction causing severe rash involving  mucus membranes or skin necrosis: no Has patient had a PCN reaction that required hospitalization no Has patient had a PCN reaction occurring within the last 10 years: no If all of the above answers are "NO", then may proceed  with Cephalosporin use.    Past Medical History:  Diagnosis Date  . Arthritis   . Cancer [facility])    BREAST CANCER / CERVICAL CANCER   . Cervical cancer ([place])   . History of breast cancer 2006   Dr. Sonny Dandy oncologist - left breast  . History of gout   . Hyperlipidemia   . Urinary retention Sept 2016   s/p hysterectomy   Past Surgical History:  Procedure Laterality Date  . INGUINAL HERNIA REPAIR Right 12/14/2015   Procedure: LAPAROSCOPIC AND OPEN RIGHT  INGUINAL HERNIA;  Surgeon: Johnathan Hausen, MD;  Location: [facility];  Service: General;  Laterality: Right;  . KNEE ARTHROSCOPY Left   . MASTECTOMY Left 2006  . ROBOTIC ASSISTED TOTAL HYSTERECTOMY WITH BILATERAL SALPINGO OOPHERECTOMY Bilateral 04/07/2015   Procedure: ROBOTIC ASSISTED RADICAL HYSTERECTOMY BILATERAL SALPINGO OOPHORECTOMY BILATERAL SENTINEL LYMPHADENETOMY;  Surgeon: Everitt Amber, MD;  Location: [facility];  Service: Gynecology;  Laterality: Bilateral;  . TUBAL LIGATION     Family History  Problem Relation Age of Onset  . Prostate cancer Father   . Heart disease Father   . Heart disease Mother   . Breast cancer Sister    Social History   Social History  . Marital status: Married    Spouse name: N/A  . Number of children: 1  . Years of education: N/A   Occupational History  . works at [employer] in [employer]    Social History Main Topics  . Smoking status: Former Smoker    Packs/day: 1.00    Years: 30.00    Types: Cigarettes    Quit date: 07/23/1994  . Smokeless tobacco: Never Used  . Alcohol use No  . Drug use: No  . Sexual activity: Not Currently   Other Topics Concern  . Not on file   Social History Narrative  . No narrative on file     Review of systems: Constitutional:  She has no weight gain or weight loss. She has no fever or chills. Eyes: No blurred vision Ears, Nose, Mouth, Throat: No dizziness, headaches or changes in hearing. No mouth sores. Cardiovascular: No chest pain, palpitations  or edema. Respiratory:  No shortness of breath, wheezing or cough Gastrointestinal: She has normal bowel movements without diarrhea or constipation. She denies any nausea or vomiting. She denies blood in her stool or heart burn. Genitourinary:  She denies pelvic pain, pelvic pressure or changes in her urinary function. She has no hematuria, dysuria, or incontinence. + urinary retention Musculoskeletal: Denies muscle weakness or joint pains Skin:  She has no skin changes, rashes or itching Neurological:  Denies dizziness or headaches. No neuropathy, no numbness or tingling. Psychiatric:  She denies depression or anxiety. Hematologic/Lymphatic:   No easy bruising or bleeding   Physical Exam: Blood pressure (!) 147/72, pulse 77, temperature 97.7 F (36.5 C), temperature source Oral, resp. rate 18, height 5\' 3"  (1.6 m), weight 130 lb 6.4 oz (59.1 kg), SpO2 100 %. General: Well dressed, well nourished in no apparent distress.   HEENT:  Normocephalic and atraumatic, no lesions.  Extraocular muscles intact. Sclerae anicteric. Pupils equal, round, reactive. No mouth sores or ulcers. Thyroid is normal size, not nodular, midline. Skin:  No lesions or rashes. Abdomen:  Soft, nontender,  nondistended.  No palpable masses.  No hepatosplenomegaly.  No ascites. Normal bowel sounds. Incisions are well healed including hernia incision. Genitourinary: Normal EGBUS  No lesions. No bleeding or discharge.  No cul de sac fullness. Bladder not palpably distended with urine. Pap taken. Extremities: No cyanosis, clubbing or edema.  No calf tenderness or erythema. No palpable cords. Psychiatric: Mood and affect are appropriate. Neurological: Awake, alert and oriented x 3. Sensation is intact, no neuropathy.  Musculoskeletal: No pain, normal strength and range of motion.  Donaciano Eva, MD

## 2016-04-03 ENCOUNTER — Telehealth: Payer: Self-pay

## 2016-04-03 LAB — CYTOLOGY - PAP

## 2016-04-03 NOTE — Telephone Encounter (Signed)
Orders received from [place] to contact the patient with PAP results being "normal" with benign radiation changes. Patient contacted and updated , patient states understanding , denies further questions at this time.

## 2016-04-04 DIAGNOSIS — M810 Age-related osteoporosis without current pathological fracture: Secondary | ICD-10-CM | POA: Diagnosis not present

## 2016-05-04 DIAGNOSIS — M170 Bilateral primary osteoarthritis of knee: Secondary | ICD-10-CM | POA: Diagnosis not present

## 2016-06-27 ENCOUNTER — Ambulatory Visit: Payer: Medicare Other | Attending: Gynecologic Oncology | Admitting: Gynecologic Oncology

## 2016-06-27 ENCOUNTER — Encounter: Payer: Self-pay | Admitting: Gynecologic Oncology

## 2016-06-27 VITALS — BP 140/62 | HR 71 | Temp 98.2°F | Resp 18 | Ht 63.0 in | Wt 128.8 lb

## 2016-06-27 DIAGNOSIS — Z881 Allergy status to other antibiotic agents status: Secondary | ICD-10-CM | POA: Diagnosis not present

## 2016-06-27 DIAGNOSIS — Z886 Allergy status to analgesic agent status: Secondary | ICD-10-CM | POA: Diagnosis not present

## 2016-06-27 DIAGNOSIS — C539 Malignant neoplasm of cervix uteri, unspecified: Secondary | ICD-10-CM

## 2016-06-27 DIAGNOSIS — Z853 Personal history of malignant neoplasm of breast: Secondary | ICD-10-CM | POA: Diagnosis not present

## 2016-06-27 DIAGNOSIS — Z8249 Family history of ischemic heart disease and other diseases of the circulatory system: Secondary | ICD-10-CM | POA: Diagnosis not present

## 2016-06-27 DIAGNOSIS — Z880 Allergy status to penicillin: Secondary | ICD-10-CM | POA: Insufficient documentation

## 2016-06-27 DIAGNOSIS — Z90722 Acquired absence of ovaries, bilateral: Secondary | ICD-10-CM | POA: Diagnosis not present

## 2016-06-27 DIAGNOSIS — Z87891 Personal history of nicotine dependence: Secondary | ICD-10-CM | POA: Diagnosis not present

## 2016-06-27 DIAGNOSIS — Z90710 Acquired absence of both cervix and uterus: Secondary | ICD-10-CM | POA: Insufficient documentation

## 2016-06-27 DIAGNOSIS — E785 Hyperlipidemia, unspecified: Secondary | ICD-10-CM | POA: Insufficient documentation

## 2016-06-27 DIAGNOSIS — R32 Unspecified urinary incontinence: Secondary | ICD-10-CM

## 2016-06-27 DIAGNOSIS — N3289 Other specified disorders of bladder: Secondary | ICD-10-CM | POA: Diagnosis not present

## 2016-06-27 DIAGNOSIS — Z8541 Personal history of malignant neoplasm of cervix uteri: Secondary | ICD-10-CM

## 2016-06-27 DIAGNOSIS — Z9012 Acquired absence of left breast and nipple: Secondary | ICD-10-CM | POA: Insufficient documentation

## 2016-06-27 DIAGNOSIS — Z8042 Family history of malignant neoplasm of prostate: Secondary | ICD-10-CM | POA: Insufficient documentation

## 2016-06-27 DIAGNOSIS — M109 Gout, unspecified: Secondary | ICD-10-CM | POA: Insufficient documentation

## 2016-06-27 DIAGNOSIS — R339 Retention of urine, unspecified: Secondary | ICD-10-CM | POA: Diagnosis not present

## 2016-06-27 DIAGNOSIS — Z803 Family history of malignant neoplasm of breast: Secondary | ICD-10-CM | POA: Diagnosis not present

## 2016-06-27 NOTE — Patient Instructions (Signed)
Follow up in 3 months. Appointment is booked.

## 2016-06-27 NOTE — Progress Notes (Signed)
CERVICAL CANCER FOLLOWUP Assessment:    75 y.o. 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   S/p robotic radical hysterectomy, BSO and bilateral SLN biopsy on 04/07/15. No evidence of recurrence.  1/ Urinary incontinence (overflow) and bladder dysfunction (lack of sensation of voiding) Stable though not improving. Sees Dr Matilde Sprang for this.  Plan: 1) cervical cancer: continue 3 monthly followup  - next cytology in September 2018.  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 Continue care with Dr Matilde Sprang appreciate his assistance.  3) Surveillance: Return to clinic 3 months to evaluate for surveillance of cancer. Repeat pap in September 2018.  HPI:  Pam [name] is a 75 y.o. year old initially seen in consultation on 04/25/15 for cervical cancer, referred by Dr Benjie Karvonen.  She then underwent a robotic radical hysterectomy, BSO, bilateral pelvic sentinel lymph node biopsy on [id number] without complications. Preoperative PET scan on 03/30/15 was negative for metastatic disease Her postoperative course was complicated by the development of urinary retention and overflow incontinence. She required prolonged catheterization and then intermittent self catheterization teaching.  Her final pathology revealed a 4.5cm moderately differentiated SCC of the cervix which had no LVSI, but had deep cervical stromal invasion (11 of 58mm). She had negative margins, negative nodes and negative parametrium. Her closest margin was 44mm.  She was recommended adjuvant pelvic RT to decrease risk of recurrence, but declined this after counseling with Dr Sondra Come due to patient concern regarding toxicity.   She developed postoperative urinary retention likely secondary to the radical parametrial dissection and interruption of  hypogastric nerves from her radical procedure. She was encouraged to self cath every 2hours to keep herself dry (she would have incontinence if she left it longer).  The patient was evaluated by Dr Matilde Sprang and has been continuing to self cath every 2 - 3 hours. She spontaneously voids only a little (50cc). She has some acceptance with this (less frustration).  In May, 2017 Dr Hassell Done from [place] repaired her right indirect hernia. This was an uncomplicated surgery.   Interval Hx.  She denies vaginal bleeding. Her urinary retention is stable but bothersome. She continues to see Dr McDiarmid for this.  Pap was normal September, 2017.  Current Outpatient Prescriptions on File Prior to Visit  Medication Sig Dispense Refill  . Calcium Carbonate (CALCIUM 600 PO) Take 1 tablet by mouth daily.    . Cholecalciferol (VITAMIN D PO) Take 1 capsule by mouth daily.    . diclofenac (VOLTAREN) 75 MG EC tablet Take 75 mg by mouth daily.    . fexofenadine (ALLEGRA) 180 MG tablet Take 180 mg by mouth daily.    Marland Kitchen OVER THE COUNTER MEDICATION Take 1 tablet by mouth daily. Move Free Ultra Triple Action for Joint Health    . UNABLE TO FIND In and out foley catheter 14 Fr. Q# 20   Refills: none Insert catheter every 2 hours to empty bladder. 1 each 1   No current facility-administered medications on file prior to visit.    Allergies  Allergen Reactions  . Aspirin Nausea And Vomiting  . Ciprofloxacin Diarrhea  . Flagyl [Metronidazole] Diarrhea  . Penicillins Hives and Other (See Comments)    Has patient had a PCN reaction causing immediate rash, facial/tongue/throat swelling, SOB or lightheadedness with hypotension: no Has patient had a PCN reaction causing severe rash involving  mucus membranes or skin necrosis: no Has patient had a PCN reaction that required hospitalization no Has patient had a PCN reaction occurring within the last 10 years: no If all of the above answers are "NO", then may proceed with  Cephalosporin use.    Past Medical History:  Diagnosis Date  . Arthritis   . Cancer [facility])    BREAST CANCER / CERVICAL CANCER   . Cervical cancer ([place])   . History of breast cancer 2006   Dr. Sonny Dandy oncologist - left breast  . History of gout   . Hyperlipidemia   . Urinary retention Sept 2016   s/p hysterectomy   Past Surgical History:  Procedure Laterality Date  . INGUINAL HERNIA REPAIR Right 12/14/2015   Procedure: LAPAROSCOPIC AND OPEN RIGHT  INGUINAL HERNIA;  Surgeon: Johnathan Hausen, MD;  Location: [facility];  Service: General;  Laterality: Right;  . KNEE ARTHROSCOPY Left   . MASTECTOMY Left 2006  . ROBOTIC ASSISTED TOTAL HYSTERECTOMY WITH BILATERAL SALPINGO OOPHERECTOMY Bilateral 04/07/2015   Procedure: ROBOTIC ASSISTED RADICAL HYSTERECTOMY BILATERAL SALPINGO OOPHORECTOMY BILATERAL SENTINEL LYMPHADENETOMY;  Surgeon: Everitt Amber, MD;  Location: [facility];  Service: Gynecology;  Laterality: Bilateral;  . TUBAL LIGATION     Family History  Problem Relation Age of Onset  . Prostate cancer Father   . Heart disease Father   . Heart disease Mother   . Breast cancer Sister    Social History   Social History  . Marital status: Married    Spouse name: N/A  . Number of children: 1  . Years of education: N/A   Occupational History  . works at [employer] in [employer]    Social History Main Topics  . Smoking status: Former Smoker    Packs/day: 1.00    Years: 30.00    Types: Cigarettes    Quit date: 07/23/1994  . Smokeless tobacco: Never Used  . Alcohol use Not on file  . Drug use: Unknown  . Sexual activity: Not Currently   Other Topics Concern  . Not on file   Social History Narrative  . No narrative on file     Review of systems: Constitutional:  She has no weight gain or weight loss. She has no fever or chills. Eyes: No blurred vision Ears, Nose, Mouth, Throat: No dizziness, headaches or changes in hearing. No mouth sores. Cardiovascular: No chest pain,  palpitations or edema. Respiratory:  No shortness of breath, wheezing or cough Gastrointestinal: She has normal bowel movements without diarrhea or constipation. She denies any nausea or vomiting. She denies blood in her stool or heart burn. Genitourinary:  She denies pelvic pain, pelvic pressure or changes in her urinary function. She has no hematuria, dysuria, or incontinence. + urinary retention Musculoskeletal: Denies muscle weakness or joint pains Skin:  She has no skin changes, rashes or itching Neurological:  Denies dizziness or headaches. No neuropathy, no numbness or tingling. Psychiatric:  She denies depression or anxiety. Hematologic/Lymphatic:   No easy bruising or bleeding   Physical Exam: Blood pressure 140/62, pulse 71, temperature 98.2 F (36.8 C), temperature source Oral, resp. rate 18, height 5\' 3"  (1.6 m), weight 128 lb 12.8 oz (58.4 kg), SpO2 97 %. General: Well dressed, well nourished in no apparent distress.   HEENT:  Normocephalic and atraumatic, no lesions.  Extraocular muscles intact. Sclerae anicteric. Pupils equal, round, reactive. No mouth sores or ulcers. Thyroid is normal size, not nodular, midline. Skin:  No lesions or rashes. Abdomen:  Soft,  nontender, nondistended.  No palpable masses.  No hepatosplenomegaly.  No ascites. Normal bowel sounds. Incisions are well healed including hernia incision. Genitourinary: Normal EGBUS  No lesions. No bleeding or discharge.  No cul de sac fullness. Bladder not palpably distended with urine.  Extremities: No cyanosis, clubbing or edema.  No calf tenderness or erythema. No palpable cords. Psychiatric: Mood and affect are appropriate. Neurological: Awake, alert and oriented x 3. Sensation is intact, no neuropathy.  Musculoskeletal: No pain, normal strength and range of motion.  Donaciano Eva, MD

## 2016-07-02 ENCOUNTER — Ambulatory Visit: Payer: Medicare Other | Admitting: Gynecologic Oncology

## 2016-07-06 DIAGNOSIS — M19072 Primary osteoarthritis, left ankle and foot: Secondary | ICD-10-CM | POA: Diagnosis not present

## 2016-07-06 DIAGNOSIS — M25572 Pain in left ankle and joints of left foot: Secondary | ICD-10-CM | POA: Diagnosis not present

## 2016-08-10 DIAGNOSIS — M170 Bilateral primary osteoarthritis of knee: Secondary | ICD-10-CM | POA: Diagnosis not present

## 2016-08-10 DIAGNOSIS — G2581 Restless legs syndrome: Secondary | ICD-10-CM | POA: Diagnosis not present

## 2016-10-09 DIAGNOSIS — H2513 Age-related nuclear cataract, bilateral: Secondary | ICD-10-CM | POA: Diagnosis not present

## 2016-10-09 DIAGNOSIS — H40013 Open angle with borderline findings, low risk, bilateral: Secondary | ICD-10-CM | POA: Diagnosis not present

## 2016-10-12 DIAGNOSIS — N3942 Incontinence without sensory awareness: Secondary | ICD-10-CM | POA: Diagnosis not present

## 2016-10-12 DIAGNOSIS — R338 Other retention of urine: Secondary | ICD-10-CM | POA: Diagnosis not present

## 2016-11-02 ENCOUNTER — Ambulatory Visit: Payer: Medicare Other | Attending: Gynecologic Oncology | Admitting: Gynecologic Oncology

## 2016-11-02 ENCOUNTER — Encounter: Payer: Self-pay | Admitting: Gynecologic Oncology

## 2016-11-02 VITALS — BP 139/70 | HR 77 | Temp 98.1°F | Resp 20 | Wt 131.9 lb

## 2016-11-02 DIAGNOSIS — Z90722 Acquired absence of ovaries, bilateral: Secondary | ICD-10-CM | POA: Diagnosis not present

## 2016-11-02 DIAGNOSIS — Z8541 Personal history of malignant neoplasm of cervix uteri: Secondary | ICD-10-CM | POA: Diagnosis not present

## 2016-11-02 DIAGNOSIS — C539 Malignant neoplasm of cervix uteri, unspecified: Secondary | ICD-10-CM | POA: Diagnosis not present

## 2016-11-02 DIAGNOSIS — Z90710 Acquired absence of both cervix and uterus: Secondary | ICD-10-CM

## 2016-11-02 DIAGNOSIS — R338 Other retention of urine: Secondary | ICD-10-CM | POA: Diagnosis not present

## 2016-11-02 DIAGNOSIS — Z87891 Personal history of nicotine dependence: Secondary | ICD-10-CM | POA: Insufficient documentation

## 2016-11-02 DIAGNOSIS — Z881 Allergy status to other antibiotic agents status: Secondary | ICD-10-CM | POA: Insufficient documentation

## 2016-11-02 DIAGNOSIS — Z880 Allergy status to penicillin: Secondary | ICD-10-CM | POA: Insufficient documentation

## 2016-11-02 DIAGNOSIS — Z79899 Other long term (current) drug therapy: Secondary | ICD-10-CM | POA: Insufficient documentation

## 2016-11-02 NOTE — Progress Notes (Signed)
CERVICAL CANCER FOLLOWUP Assessment:    76 y.o. 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   S/p robotic radical hysterectomy, BSO and bilateral SLN biopsy on 04/07/15. No evidence of recurrence.  Urinary incontinence (overflow) and bladder dysfunction (lack of sensation of voiding)  Plan: 1) cervical cancer: continue 3 monthly followup  - next cytology in September 2018.  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 Continue care with Dr Matilde Sprang appreciate his assistance.  3) Surveillance: Return to clinic 3 months to evaluate for surveillance of cancer. Repeat pap in September 2018.  HPI:  Pam [name] is a 76 y.o. year old initially seen in consultation on 04/25/15 for cervical cancer, referred by Dr Benjie Karvonen.  She then underwent a robotic radical hysterectomy, BSO, bilateral pelvic sentinel lymph node biopsy on 9/38/18 without complications. Preoperative PET scan on 03/30/15 was negative for metastatic disease Her postoperative course was complicated by the development of urinary retention and overflow incontinence. She required prolonged catheterization and then intermittent self catheterization teaching.  Her final pathology revealed a 4.5cm moderately differentiated SCC of the cervix which had no LVSI, but had deep cervical stromal invasion (11 of 4mm). She had negative margins, negative nodes and negative parametrium. Her closest margin was 25mm.  She was recommended adjuvant pelvic RT to decrease risk of recurrence, but declined this after counseling with Dr Sondra Come due to patient concern regarding toxicity.   She developed postoperative urinary retention likely secondary to the radical parametrial dissection and interruption of hypogastric nerves from her radical procedure. She was encouraged to  self cath every 2hours to keep herself dry (she would have incontinence if she left it longer).  The patient was evaluated by Dr Matilde Sprang and has been continuing to self cath every 2 - 3 hours. She spontaneously voids only a little (50cc). She has some acceptance with this (less frustration).  In May, 2017 Dr Hassell Done from [place] repaired her right indirect hernia. This was an uncomplicated surgery.   Interval Hx.  She denies vaginal bleeding. Her urinary retention is stable but bothersome. She continues to see Dr McDiarmid for this.  Pap was normal September, 2017.  Current Outpatient Prescriptions on File Prior to Visit  Medication Sig Dispense Refill  . Calcium Carbonate (CALCIUM 600 PO) Take 1 tablet by mouth daily.    . Cholecalciferol (VITAMIN D PO) Take 1 capsule by mouth daily.    . diclofenac (VOLTAREN) 75 MG EC tablet Take 75 mg by mouth daily.    . fexofenadine (ALLEGRA) 180 MG tablet Take 180 mg by mouth daily.    Marland Kitchen OVER THE COUNTER MEDICATION Take 1 tablet by mouth daily. Move Free Ultra Triple Action for Joint Health    . UNABLE TO FIND In and out foley catheter 14 Fr. Q# 20   Refills: none Insert catheter every 2 hours to empty bladder. 1 each 1   No current facility-administered medications on file prior to visit.    Allergies  Allergen Reactions  . Aspirin Nausea And Vomiting  . Ciprofloxacin Diarrhea  . Flagyl [Metronidazole] Diarrhea  . Penicillins Hives and Other (See Comments)    Has patient had a PCN reaction causing immediate rash, facial/tongue/throat swelling, SOB or lightheadedness with hypotension: no Has patient had a PCN reaction causing severe rash involving mucus membranes or skin necrosis: no Has patient had a  PCN reaction that required hospitalization no Has patient had a PCN reaction occurring within the last 10 years: no If all of the above answers are "NO", then may proceed with Cephalosporin use.    Past Medical History:  Diagnosis Date  .  Arthritis   . Cancer [facility])    BREAST CANCER / CERVICAL CANCER   . Cervical cancer ([place])   . History of breast cancer 2006   Dr. Sonny Dandy oncologist - left breast  . History of gout   . Hyperlipidemia   . Urinary retention Sept 2016   s/p hysterectomy   Past Surgical History:  Procedure Laterality Date  . INGUINAL HERNIA REPAIR Right 12/14/2015   Procedure: LAPAROSCOPIC AND OPEN RIGHT  INGUINAL HERNIA;  Surgeon: Johnathan Hausen, MD;  Location: [facility];  Service: General;  Laterality: Right;  . KNEE ARTHROSCOPY Left   . MASTECTOMY Left 2006  . ROBOTIC ASSISTED TOTAL HYSTERECTOMY WITH BILATERAL SALPINGO OOPHERECTOMY Bilateral 04/07/2015   Procedure: ROBOTIC ASSISTED RADICAL HYSTERECTOMY BILATERAL SALPINGO OOPHORECTOMY BILATERAL SENTINEL LYMPHADENETOMY;  Surgeon: Everitt Amber, MD;  Location: [facility];  Service: Gynecology;  Laterality: Bilateral;  . TUBAL LIGATION     Family History  Problem Relation Age of Onset  . Prostate cancer Father   . Heart disease Father   . Heart disease Mother   . Breast cancer Sister    Social History   Social History  . Marital status: Married    Spouse name: N/A  . Number of children: 1  . Years of education: N/A   Occupational History  . works at [employer] in [employer]    Social History Main Topics  . Smoking status: Former Smoker    Packs/day: 1.00    Years: 30.00    Types: Cigarettes    Quit date: 07/23/1994  . Smokeless tobacco: Never Used  . Alcohol use Not on file  . Drug use: Unknown  . Sexual activity: Not Currently   Other Topics Concern  . Not on file   Social History Narrative  . No narrative on file     Review of systems: Constitutional:  She has no weight gain or weight loss. She has no fever or chills. Eyes: No blurred vision Ears, Nose, Mouth, Throat: No dizziness, headaches or changes in hearing. No mouth sores. Cardiovascular: No chest pain, palpitations or edema. Respiratory:  No shortness of breath, wheezing or  cough Gastrointestinal: She has normal bowel movements without diarrhea or constipation. She denies any nausea or vomiting. She denies blood in her stool or heart burn. Genitourinary:  She denies pelvic pain, pelvic pressure or changes in her urinary function. She has no hematuria, dysuria, or incontinence. + urinary retention Musculoskeletal: Denies muscle weakness or joint pains Skin:  She has no skin changes, rashes or itching Neurological:  Denies dizziness or headaches. No neuropathy, no numbness or tingling. Psychiatric:  She denies depression or anxiety. Hematologic/Lymphatic:   No easy bruising or bleeding   Physical Exam: Blood pressure 139/70, pulse 77, temperature 98.1 F (36.7 C), temperature source Oral, resp. rate 20, weight 131 lb 14.4 oz (59.8 kg). General: Well dressed, well nourished in no apparent distress.   HEENT:  Normocephalic and atraumatic, no lesions.  Extraocular muscles intact. Sclerae anicteric. Pupils equal, round, reactive. No mouth sores or ulcers. Thyroid is normal size, not nodular, midline. Skin:  No lesions or rashes. Abdomen:  Soft, nontender, nondistended.  No palpable masses.  No hepatosplenomegaly.  No ascites. Normal bowel sounds. Incisions are well  healed including hernia incision. Genitourinary: Normal EGBUS  No lesions. No bleeding or discharge.  No cul de sac fullness. Bladder not palpably distended with urine.  Extremities: No cyanosis, clubbing or edema.  No calf tenderness or erythema. No palpable cords. Psychiatric: Mood and affect are appropriate. Neurological: Awake, alert and oriented x 3. Sensation is intact, no neuropathy.  Musculoskeletal: No pain, normal strength and range of motion.  Donaciano Eva, MD

## 2016-11-02 NOTE — Patient Instructions (Signed)
Please follow-up with Dr Serita Grit office in July, 2018 for an appointment. Please call [phone] for this appointment.

## 2016-11-09 DIAGNOSIS — G2581 Restless legs syndrome: Secondary | ICD-10-CM | POA: Diagnosis not present

## 2016-11-09 DIAGNOSIS — M170 Bilateral primary osteoarthritis of knee: Secondary | ICD-10-CM | POA: Diagnosis not present

## 2017-01-02 ENCOUNTER — Other Ambulatory Visit: Payer: Self-pay | Admitting: Dermatology

## 2017-01-02 DIAGNOSIS — D492 Neoplasm of unspecified behavior of bone, soft tissue, and skin: Secondary | ICD-10-CM | POA: Diagnosis not present

## 2017-01-02 DIAGNOSIS — D229 Melanocytic nevi, unspecified: Secondary | ICD-10-CM | POA: Diagnosis not present

## 2017-01-02 DIAGNOSIS — Z85828 Personal history of other malignant neoplasm of skin: Secondary | ICD-10-CM | POA: Diagnosis not present

## 2017-01-02 DIAGNOSIS — L820 Inflamed seborrheic keratosis: Secondary | ICD-10-CM | POA: Diagnosis not present

## 2017-01-18 DIAGNOSIS — N390 Urinary tract infection, site not specified: Secondary | ICD-10-CM | POA: Diagnosis not present

## 2017-01-18 DIAGNOSIS — R338 Other retention of urine: Secondary | ICD-10-CM | POA: Diagnosis not present

## 2017-01-18 DIAGNOSIS — N3942 Incontinence without sensory awareness: Secondary | ICD-10-CM | POA: Diagnosis not present

## 2017-01-25 ENCOUNTER — Ambulatory Visit: Payer: Medicare Other | Attending: Gynecologic Oncology | Admitting: Gynecologic Oncology

## 2017-01-25 ENCOUNTER — Encounter: Payer: Self-pay | Admitting: Gynecologic Oncology

## 2017-01-25 VITALS — BP 130/65 | HR 62 | Temp 97.7°F | Resp 18 | Wt 129.0 lb

## 2017-01-25 DIAGNOSIS — Z90710 Acquired absence of both cervix and uterus: Secondary | ICD-10-CM | POA: Diagnosis not present

## 2017-01-25 DIAGNOSIS — Z08 Encounter for follow-up examination after completed treatment for malignant neoplasm: Secondary | ICD-10-CM | POA: Insufficient documentation

## 2017-01-25 DIAGNOSIS — Z90722 Acquired absence of ovaries, bilateral: Secondary | ICD-10-CM | POA: Insufficient documentation

## 2017-01-25 DIAGNOSIS — M109 Gout, unspecified: Secondary | ICD-10-CM | POA: Diagnosis not present

## 2017-01-25 DIAGNOSIS — E785 Hyperlipidemia, unspecified: Secondary | ICD-10-CM | POA: Diagnosis not present

## 2017-01-25 DIAGNOSIS — Z9079 Acquired absence of other genital organ(s): Secondary | ICD-10-CM | POA: Insufficient documentation

## 2017-01-25 DIAGNOSIS — Z853 Personal history of malignant neoplasm of breast: Secondary | ICD-10-CM | POA: Insufficient documentation

## 2017-01-25 DIAGNOSIS — Z8541 Personal history of malignant neoplasm of cervix uteri: Secondary | ICD-10-CM | POA: Diagnosis not present

## 2017-01-25 DIAGNOSIS — R339 Retention of urine, unspecified: Secondary | ICD-10-CM | POA: Diagnosis not present

## 2017-01-25 DIAGNOSIS — Z87891 Personal history of nicotine dependence: Secondary | ICD-10-CM | POA: Insufficient documentation

## 2017-01-25 DIAGNOSIS — C539 Malignant neoplasm of cervix uteri, unspecified: Secondary | ICD-10-CM

## 2017-01-25 NOTE — Patient Instructions (Signed)
Follow up with Dr. Denman George on 05-03-17 as scheduled.

## 2017-01-25 NOTE — Progress Notes (Signed)
CERVICAL CANCER FOLLOWUP Assessment:    76 y.o. 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   S/p robotic radical hysterectomy, BSO and bilateral SLN biopsy on 04/07/15. No evidence of recurrence.  Urinary incontinence (overflow) and bladder dysfunction (lack of sensation of voiding)  Plan: 1) cervical cancer: continue 3 monthly followup  - next cytology in October 2018.  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 Continue care with Dr Pam [name] appreciate his assistance. Likely sacral nerve stimulator to be placed in 2019.  3) Surveillance: Return to clinic 3 months to evaluate for surveillance of cancer. Repeat pap in October 2018.  HPI:  Pam [name] is a 76 y.o. year old initially seen in consultation on 04/25/15 for cervical cancer, referred by Dr Pam [name].  She then underwent a robotic radical hysterectomy, BSO, bilateral pelvic sentinel lymph node biopsy on 4/43/15 without complications. Preoperative PET scan on 03/30/15 was negative for metastatic disease Her postoperative course was complicated by the development of urinary retention and overflow incontinence. She required prolonged catheterization and then intermittent self catheterization teaching.  Her final pathology revealed a 4.5cm moderately differentiated SCC of the cervix which had no LVSI, but had deep cervical stromal invasion (11 of 34mm). She had negative margins, negative nodes and negative parametrium. Her closest margin was 25mm.  She was recommended adjuvant pelvic RT to decrease risk of recurrence, but declined this after counseling with Dr Pam [name] due to patient concern regarding toxicity.   She developed postoperative urinary retention likely secondary to the radical parametrial dissection and interruption of hypogastric nerves from  her radical procedure. She was encouraged to self cath every 2hours to keep herself dry (she would have incontinence if she left it longer).  The patient was evaluated by Dr Pam [name] and has been continuing to self cath every 2 - 3 hours. She spontaneously voids only a little (50cc). She has some acceptance with this (less frustration).  In May, 2017 Dr Pam [name] from [place] repaired her right indirect hernia. This was an uncomplicated surgery.   Interval Hx.  She denies vaginal bleeding. Her urinary retention is stable but bothersome. She continues to see Dr Pam [name] for this.  Pap was normal September, 2017.  Current Outpatient Prescriptions on File Prior to Visit  Medication Sig Dispense Refill  . Calcium Carbonate (CALCIUM 600 PO) Take 1 tablet by mouth [name].    . Cholecalciferol (VITAMIN D PO) Take 1 capsule by mouth [name].    . diclofenac (VOLTAREN) 75 MG EC tablet Take 75 mg by mouth [name].    . fexofenadine (ALLEGRA) 180 MG tablet Take 180 mg by mouth [name].    Marland Kitchen OVER THE COUNTER MEDICATION Take 1 tablet by mouth [name]. Move Free Ultra Triple Action for Joint Health    . UNABLE TO FIND In and out foley catheter 14 Fr. Q# 20   Refills: none Insert catheter every 2 hours to empty bladder. 1 each 1   No current facility-administered medications on file prior to visit.    Allergies  Allergen Reactions  . Aspirin Nausea And Vomiting  . Ciprofloxacin Diarrhea  . Flagyl [Metronidazole] Diarrhea  . Penicillins Hives and Other (See Comments)    Has patient had a PCN reaction causing immediate rash, facial/tongue/throat swelling, SOB or lightheadedness with hypotension: no Has patient had a PCN reaction causing severe rash involving mucus  membranes or skin necrosis: no Has patient had a PCN reaction that required hospitalization no Has patient had a PCN reaction occurring within the last 10 years: no If all of the above answers are "NO", then may proceed with Cephalosporin use.     Past Medical History:  Diagnosis Date  . Arthritis   . Cancer [facility])    BREAST CANCER / CERVICAL CANCER   . Cervical cancer ([place])   . History of breast cancer 2006   Pam [name] oncologist - left breast  . History of gout   . Hyperlipidemia   . Urinary retention Sept 2016   s/p hysterectomy   Past Surgical History:  Procedure Laterality Date  . INGUINAL HERNIA REPAIR Right 12/14/2015   Procedure: LAPAROSCOPIC AND OPEN RIGHT  INGUINAL HERNIA;  Surgeon: Pam Hausen, MD;  Location: [facility];  Service: General;  Laterality: Right;  . KNEE ARTHROSCOPY Left   . MASTECTOMY Left 2006  . ROBOTIC ASSISTED TOTAL HYSTERECTOMY WITH BILATERAL SALPINGO OOPHERECTOMY Bilateral 04/07/2015   Procedure: ROBOTIC ASSISTED RADICAL HYSTERECTOMY BILATERAL SALPINGO OOPHORECTOMY BILATERAL SENTINEL LYMPHADENETOMY;  Surgeon: Pam Amber, MD;  Location: [facility];  Service: Gynecology;  Laterality: Bilateral;  . TUBAL LIGATION     Family History  Problem Relation Age of Onset  . Prostate cancer Father   . Heart disease Father   . Heart disease Mother   . Breast cancer Sister    Social History   Social History  . Marital status: Married    Spouse name: N/A  . Number of children: 1  . Years of education: N/A   Occupational History  . works at [employer] in [employer]    Social History Main Topics  . Smoking status: Former Smoker    Packs/day: 1.00    Years: 30.00    Types: Cigarettes    Quit date: 07/23/1994  . Smokeless tobacco: Never Used  . Alcohol use Not on file  . Drug use: Unknown  . Sexual activity: Not Currently   Other Topics Concern  . Not on file   Social History Narrative  . No narrative on file     Review of systems: Constitutional:  She has no weight gain or weight loss. She has no fever or chills. Eyes: No blurred vision Ears, Nose, Mouth, Throat: No dizziness, headaches or changes in hearing. No mouth sores. Cardiovascular: No chest pain, palpitations or  edema. Respiratory:  No shortness of breath, wheezing or cough Gastrointestinal: She has normal bowel movements without diarrhea or constipation. She denies any nausea or vomiting. She denies blood in her stool or heart burn. Genitourinary:  She denies pelvic pain, pelvic pressure or changes in her urinary function. She has no hematuria, dysuria, or incontinence. + urinary retention Musculoskeletal: Denies muscle weakness or joint pains Skin:  She has no skin changes, rashes or itching Neurological:  Denies dizziness or headaches. No neuropathy, no numbness or tingling. Psychiatric:  She denies depression or anxiety. Hematologic/Lymphatic:   No easy bruising or bleeding   Physical Exam: Blood pressure 130/65, pulse 62, temperature 97.7 F (36.5 C), temperature source Oral, resp. rate 18, weight 129 lb (58.5 kg), SpO2 100 %. General: Well dressed, well nourished in no apparent distress.   HEENT:  Normocephalic and atraumatic, no lesions.  Extraocular muscles intact. Sclerae anicteric. Pupils equal, round, reactive. No mouth sores or ulcers. Thyroid is normal size, not nodular, midline. Skin:  No lesions or rashes. Abdomen:  Soft, nontender, nondistended.  No palpable masses.  No  hepatosplenomegaly.  No ascites. Normal bowel sounds. Incisions are well healed including hernia incision. Genitourinary: Normal EGBUS  No lesions. No bleeding or discharge.  No cul de sac fullness. Bladder not palpably distended with urine.  Extremities: No cyanosis, clubbing or edema.  No calf tenderness or erythema. No palpable cords. Psychiatric: Mood and affect are appropriate. Neurological: Awake, alert and oriented x 3. Sensation is intact, no neuropathy.  Musculoskeletal: No pain, normal strength and range of motion.  Donaciano Eva, MD

## 2017-02-01 DIAGNOSIS — M7672 Peroneal tendinitis, left leg: Secondary | ICD-10-CM | POA: Diagnosis not present

## 2017-02-01 DIAGNOSIS — M659 Synovitis and tenosynovitis, unspecified: Secondary | ICD-10-CM | POA: Diagnosis not present

## 2017-02-01 DIAGNOSIS — M19072 Primary osteoarthritis, left ankle and foot: Secondary | ICD-10-CM | POA: Diagnosis not present

## 2017-02-08 DIAGNOSIS — M659 Synovitis and tenosynovitis, unspecified: Secondary | ICD-10-CM | POA: Diagnosis not present

## 2017-02-08 DIAGNOSIS — M7752 Other enthesopathy of left foot: Secondary | ICD-10-CM | POA: Diagnosis not present

## 2017-02-15 DIAGNOSIS — Z1231 Encounter for screening mammogram for malignant neoplasm of breast: Secondary | ICD-10-CM | POA: Diagnosis not present

## 2017-02-15 DIAGNOSIS — Z853 Personal history of malignant neoplasm of breast: Secondary | ICD-10-CM | POA: Diagnosis not present

## 2017-02-22 DIAGNOSIS — M7672 Peroneal tendinitis, left leg: Secondary | ICD-10-CM | POA: Diagnosis not present

## 2017-02-22 DIAGNOSIS — M76822 Posterior tibial tendinitis, left leg: Secondary | ICD-10-CM | POA: Diagnosis not present

## 2017-03-01 DIAGNOSIS — M170 Bilateral primary osteoarthritis of knee: Secondary | ICD-10-CM | POA: Diagnosis not present

## 2017-03-01 DIAGNOSIS — G2581 Restless legs syndrome: Secondary | ICD-10-CM | POA: Diagnosis not present

## 2017-05-03 ENCOUNTER — Ambulatory Visit: Payer: Medicare Other | Admitting: Gynecologic Oncology

## 2017-05-03 ENCOUNTER — Encounter: Payer: Self-pay | Admitting: Gynecologic Oncology

## 2017-05-03 ENCOUNTER — Ambulatory Visit: Payer: Medicare Other | Attending: Gynecologic Oncology | Admitting: Gynecologic Oncology

## 2017-05-03 ENCOUNTER — Other Ambulatory Visit (HOSPITAL_COMMUNITY)
Admission: RE | Admit: 2017-05-03 | Discharge: 2017-05-03 | Disposition: A | Discharge status: Home / Self Care | Payer: Medicare Other | Source: Ambulatory Visit | Attending: Gynecologic Oncology | Admitting: Gynecologic Oncology

## 2017-05-03 VITALS — BP 139/74 | HR 78 | Temp 97.6°F | Resp 20 | Ht 66.0 in | Wt 126.9 lb

## 2017-05-03 DIAGNOSIS — Z803 Family history of malignant neoplasm of breast: Secondary | ICD-10-CM | POA: Insufficient documentation

## 2017-05-03 DIAGNOSIS — Z79899 Other long term (current) drug therapy: Secondary | ICD-10-CM | POA: Diagnosis not present

## 2017-05-03 DIAGNOSIS — N318 Other neuromuscular dysfunction of bladder: Secondary | ICD-10-CM

## 2017-05-03 DIAGNOSIS — Z08 Encounter for follow-up examination after completed treatment for malignant neoplasm: Secondary | ICD-10-CM | POA: Diagnosis not present

## 2017-05-03 DIAGNOSIS — C539 Malignant neoplasm of cervix uteri, unspecified: Secondary | ICD-10-CM | POA: Diagnosis not present

## 2017-05-03 DIAGNOSIS — Z8249 Family history of ischemic heart disease and other diseases of the circulatory system: Secondary | ICD-10-CM | POA: Diagnosis not present

## 2017-05-03 DIAGNOSIS — Z9012 Acquired absence of left breast and nipple: Secondary | ICD-10-CM | POA: Diagnosis not present

## 2017-05-03 DIAGNOSIS — Z90710 Acquired absence of both cervix and uterus: Secondary | ICD-10-CM | POA: Diagnosis not present

## 2017-05-03 DIAGNOSIS — Z87891 Personal history of nicotine dependence: Secondary | ICD-10-CM | POA: Diagnosis not present

## 2017-05-03 DIAGNOSIS — Z8042 Family history of malignant neoplasm of prostate: Secondary | ICD-10-CM | POA: Diagnosis not present

## 2017-05-03 DIAGNOSIS — Z9851 Tubal ligation status: Secondary | ICD-10-CM | POA: Diagnosis not present

## 2017-05-03 DIAGNOSIS — R32 Unspecified urinary incontinence: Secondary | ICD-10-CM | POA: Diagnosis not present

## 2017-05-03 DIAGNOSIS — Z881 Allergy status to other antibiotic agents status: Secondary | ICD-10-CM | POA: Insufficient documentation

## 2017-05-03 DIAGNOSIS — Z880 Allergy status to penicillin: Secondary | ICD-10-CM | POA: Insufficient documentation

## 2017-05-03 DIAGNOSIS — Z8541 Personal history of malignant neoplasm of cervix uteri: Secondary | ICD-10-CM | POA: Insufficient documentation

## 2017-05-03 DIAGNOSIS — Z853 Personal history of malignant neoplasm of breast: Secondary | ICD-10-CM | POA: Insufficient documentation

## 2017-05-03 NOTE — Progress Notes (Signed)
CERVICAL CANCER FOLLOWUP Assessment:    76 y.o. 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   S/p robotic radical hysterectomy, BSO and bilateral SLN biopsy on 04/07/15. No evidence of recurrence.  Urinary incontinence (overflow) and bladder dysfunction (lack of sensation of voiding)  Plan: 1) cervical cancer: continue 6 monthly followup  - next cytology in October 2019.  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 Continue care with Dr Matilde Sprang appreciate his assistance. Likely sacral nerve stimulator to be placed in 2019.  3) Surveillance: Return to clinic 6  months to evaluate for surveillance of cancer. Repeat pap in October 2019.  HPI:  Pam [name] is a 76 y.o. year old initially seen in consultation on 04/25/15 for cervical cancer, referred by Dr Benjie Karvonen.  She then underwent a robotic radical hysterectomy, BSO, bilateral pelvic sentinel lymph node biopsy on 10/30/71 without complications. Preoperative PET scan on 03/30/15 was negative for metastatic disease Her postoperative course was complicated by the development of urinary retention and overflow incontinence. She required prolonged catheterization and then intermittent self catheterization teaching.  Her final pathology revealed a 4.5cm moderately differentiated SCC of the cervix which had no LVSI, but had deep cervical stromal invasion (11 of 90mm). She had negative margins, negative nodes and negative parametrium. Her closest margin was 26mm.  She was recommended adjuvant pelvic RT to decrease risk of recurrence, but declined this after counseling with Dr Sondra Come due to patient concern regarding toxicity.   She developed postoperative urinary retention likely secondary to the radical parametrial dissection and interruption of hypogastric nerves  from her radical procedure. She was encouraged to self cath every 2hours to keep herself dry (she would have incontinence if she left it longer).  The patient was evaluated by Dr Matilde Sprang and has been continuing to self cath every 2 - 3 hours. She spontaneously voids only a little (50cc). She has some acceptance with this (less frustration).  In May, 2017 Dr Hassell Done from [place] repaired her right indirect hernia. This was an uncomplicated surgery.   Interval Hx.  She denies vaginal bleeding. Her urinary retention is stable but bothersome. She continues to see Dr McDiarmid for this. She reports symptoms of bladder prolapse.  Pap was normal September, 2017.  Current Outpatient Prescriptions on File Prior to Visit  Medication Sig Dispense Refill  . Calcium Carbonate (CALCIUM 600 PO) Take 1 tablet by mouth daily.    . Cholecalciferol (VITAMIN D PO) Take 1 capsule by mouth daily.    . diclofenac (VOLTAREN) 75 MG EC tablet Take 75 mg by mouth daily.    . fexofenadine (ALLEGRA) 180 MG tablet Take 180 mg by mouth daily.    Marland Kitchen OVER THE COUNTER MEDICATION Take 1 tablet by mouth daily. Move Free Ultra Triple Action for Joint Health    . UNABLE TO FIND In and out foley catheter 14 Fr. Q# 20   Refills: none Insert catheter every 2 hours to empty bladder. 1 each 1   No current facility-administered medications on file prior to visit.    Allergies  Allergen Reactions  . Aspirin Nausea And Vomiting  . Ciprofloxacin Diarrhea  . Flagyl [Metronidazole] Diarrhea  . Penicillins Hives and Other (See Comments)    Has patient had a PCN reaction causing immediate rash, facial/tongue/throat swelling, SOB or lightheadedness with hypotension: no Has patient had a  PCN reaction causing severe rash involving mucus membranes or skin necrosis: no Has patient had a PCN reaction that required hospitalization no Has patient had a PCN reaction occurring within the last 10 years: no If all of the above answers are "NO",  then may proceed with Cephalosporin use.    Past Medical History:  Diagnosis Date  . Arthritis   . Cancer [facility])    BREAST CANCER / CERVICAL CANCER   . Cervical cancer ([place])   . History of breast cancer 2006   Dr. Sonny Dandy oncologist - left breast  . History of gout   . Hyperlipidemia   . Urinary retention Sept 2016   s/p hysterectomy   Past Surgical History:  Procedure Laterality Date  . INGUINAL HERNIA REPAIR Right 12/14/2015   Procedure: LAPAROSCOPIC AND OPEN RIGHT  INGUINAL HERNIA;  Surgeon: Johnathan Hausen, MD;  Location: [facility];  Service: General;  Laterality: Right;  . KNEE ARTHROSCOPY Left   . MASTECTOMY Left 2006  . ROBOTIC ASSISTED TOTAL HYSTERECTOMY WITH BILATERAL SALPINGO OOPHERECTOMY Bilateral 04/07/2015   Procedure: ROBOTIC ASSISTED RADICAL HYSTERECTOMY BILATERAL SALPINGO OOPHORECTOMY BILATERAL SENTINEL LYMPHADENETOMY;  Surgeon: Everitt Amber, MD;  Location: [facility];  Service: Gynecology;  Laterality: Bilateral;  . TUBAL LIGATION     Family History  Problem Relation Age of Onset  . Prostate cancer Father   . Heart disease Father   . Heart disease Mother   . Breast cancer Sister    Social History   Social History  . Marital status: Married    Spouse name: N/A  . Number of children: 1  . Years of education: N/A   Occupational History  . works at [employer] in [employer]    Social History Main Topics  . Smoking status: Former Smoker    Packs/day: 1.00    Years: 30.00    Types: Cigarettes    Quit date: 07/23/1994  . Smokeless tobacco: Never Used  . Alcohol use Not on file  . Drug use: Unknown  . Sexual activity: Not Currently   Other Topics Concern  . Not on file   Social History Narrative  . No narrative on file     Review of systems: Constitutional:  She has no weight gain or weight loss. She has no fever or chills. Eyes: No blurred vision Ears, Nose, Mouth, Throat: No dizziness, headaches or changes in hearing. No mouth  sores. Cardiovascular: No chest pain, palpitations or edema. Respiratory:  No shortness of breath, wheezing or cough Gastrointestinal: She has normal bowel movements without diarrhea or constipation. She denies any nausea or vomiting. She denies blood in her stool or heart burn. Genitourinary:  She denies pelvic pain, pelvic pressure. She has no hematuria, dysuria, or incontinence. + urinary retention + bladder prolapse symptoms Musculoskeletal: Denies muscle weakness or joint pains Skin:  She has no skin changes, rashes or itching Neurological:  Denies dizziness or headaches. No neuropathy, no numbness or tingling. Psychiatric:  She denies depression or anxiety. Hematologic/Lymphatic:   No easy bruising or bleeding   Physical Exam: Blood pressure 139/74, pulse 78, temperature 97.6 F (36.4 C), temperature source Oral, resp. rate 20, height 5\' 6"  (1.676 m), weight 126 lb 14.4 oz (57.6 kg), SpO2 97 %. General: Well dressed, well nourished in no apparent distress.   HEENT:  Normocephalic and atraumatic, no lesions.  Extraocular muscles intact. Sclerae anicteric. Pupils equal, round, reactive. No mouth sores or ulcers. Thyroid is normal size, not nodular, midline. Skin:  No lesions or  rashes. Abdomen:  Soft, nontender, nondistended.  No palpable masses.  No hepatosplenomegaly.  No ascites. Normal bowel sounds. Incisions are well healed including hernia incision. Genitourinary: Normal EGBUS  No lesions. No bleeding or discharge.  No cul de sac fullness. Bladder not palpably distended with urine.  No gross prolapse noted. Pap taken. Extremities: No cyanosis, clubbing or edema.  No calf tenderness or erythema. No palpable cords. Psychiatric: Mood and affect are appropriate. Neurological: Awake, alert and oriented x 3. Sensation is intact, no neuropathy.  Musculoskeletal: No pain, normal strength and range of motion.  Donaciano Eva, MD

## 2017-05-03 NOTE — Patient Instructions (Signed)
Please notify Dr Denman George at phone number [phone] if you notice vaginal bleeding, new pelvic or abdominal pains, bloating, feeling full easy, or a change in bladder or bowel function.   Please contact Dr Serita Grit office in the spring, 2019 to schedule a follow-up visit in April, 2019.

## 2017-05-09 ENCOUNTER — Telehealth: Payer: Self-pay | Admitting: Gynecologic Oncology

## 2017-05-09 LAB — CYTOLOGY - PAP
Diagnosis: NEGATIVE
HPV: NOT DETECTED

## 2017-05-09 NOTE — Telephone Encounter (Signed)
Left message for patient with pap smear results.  Advised to call for any needs.

## 2017-06-07 DIAGNOSIS — G2581 Restless legs syndrome: Secondary | ICD-10-CM | POA: Diagnosis not present

## 2017-06-07 DIAGNOSIS — M170 Bilateral primary osteoarthritis of knee: Secondary | ICD-10-CM | POA: Diagnosis not present

## 2017-06-12 DIAGNOSIS — M71572 Other bursitis, not elsewhere classified, left ankle and foot: Secondary | ICD-10-CM | POA: Diagnosis not present

## 2017-06-12 DIAGNOSIS — M76812 Anterior tibial syndrome, left leg: Secondary | ICD-10-CM | POA: Diagnosis not present

## 2017-06-12 DIAGNOSIS — M659 Synovitis and tenosynovitis, unspecified: Secondary | ICD-10-CM | POA: Diagnosis not present

## 2017-06-21 DIAGNOSIS — M76812 Anterior tibial syndrome, left leg: Secondary | ICD-10-CM | POA: Diagnosis not present

## 2017-06-21 DIAGNOSIS — M71572 Other bursitis, not elsewhere classified, left ankle and foot: Secondary | ICD-10-CM | POA: Diagnosis not present

## 2017-07-19 DIAGNOSIS — S299XXA Unspecified injury of thorax, initial encounter: Secondary | ICD-10-CM | POA: Diagnosis not present

## 2017-07-19 DIAGNOSIS — J90 Pleural effusion, not elsewhere classified: Secondary | ICD-10-CM | POA: Diagnosis not present

## 2017-07-19 DIAGNOSIS — R0789 Other chest pain: Secondary | ICD-10-CM | POA: Diagnosis not present

## 2017-08-12 ENCOUNTER — Telehealth: Payer: Self-pay | Admitting: *Deleted

## 2017-08-12 NOTE — Telephone Encounter (Signed)
Patient's daughter called and schedueld an appt for April 19th at 2:30pm

## 2017-08-21 DIAGNOSIS — D229 Melanocytic nevi, unspecified: Secondary | ICD-10-CM | POA: Diagnosis not present

## 2017-08-21 DIAGNOSIS — L570 Actinic keratosis: Secondary | ICD-10-CM | POA: Diagnosis not present

## 2017-08-21 DIAGNOSIS — R338 Other retention of urine: Secondary | ICD-10-CM | POA: Diagnosis not present

## 2017-08-21 DIAGNOSIS — D692 Other nonthrombocytopenic purpura: Secondary | ICD-10-CM | POA: Diagnosis not present

## 2017-09-16 DIAGNOSIS — M170 Bilateral primary osteoarthritis of knee: Secondary | ICD-10-CM | POA: Diagnosis not present

## 2017-09-16 DIAGNOSIS — G2581 Restless legs syndrome: Secondary | ICD-10-CM | POA: Diagnosis not present

## 2017-10-11 DIAGNOSIS — H40003 Preglaucoma, unspecified, bilateral: Secondary | ICD-10-CM | POA: Diagnosis not present

## 2017-10-17 DIAGNOSIS — N3942 Incontinence without sensory awareness: Secondary | ICD-10-CM | POA: Diagnosis not present

## 2017-10-17 DIAGNOSIS — R338 Other retention of urine: Secondary | ICD-10-CM | POA: Diagnosis not present

## 2017-10-24 DIAGNOSIS — N3942 Incontinence without sensory awareness: Secondary | ICD-10-CM | POA: Diagnosis not present

## 2017-10-25 ENCOUNTER — Telehealth: Payer: Self-pay | Admitting: *Deleted

## 2017-10-25 NOTE — Telephone Encounter (Signed)
Called and left the patient a message with her appt date/time

## 2017-11-08 ENCOUNTER — Encounter: Payer: Self-pay | Admitting: Gynecologic Oncology

## 2017-11-08 ENCOUNTER — Inpatient Hospital Stay: Payer: Medicare Other | Attending: Gynecologic Oncology | Admitting: Gynecologic Oncology

## 2017-11-08 VITALS — BP 145/75 | HR 68 | Temp 97.9°F | Resp 18 | Ht 63.0 in | Wt 126.3 lb

## 2017-11-08 DIAGNOSIS — Z8541 Personal history of malignant neoplasm of cervix uteri: Secondary | ICD-10-CM | POA: Insufficient documentation

## 2017-11-08 DIAGNOSIS — C539 Malignant neoplasm of cervix uteri, unspecified: Secondary | ICD-10-CM

## 2017-11-08 DIAGNOSIS — Z90722 Acquired absence of ovaries, bilateral: Secondary | ICD-10-CM | POA: Insufficient documentation

## 2017-11-08 DIAGNOSIS — Z90710 Acquired absence of both cervix and uterus: Secondary | ICD-10-CM | POA: Diagnosis not present

## 2017-11-08 DIAGNOSIS — R32 Unspecified urinary incontinence: Secondary | ICD-10-CM | POA: Diagnosis not present

## 2017-11-08 NOTE — Patient Instructions (Signed)
Please notify Dr Denman George at phone number [phone] if you notice vaginal bleeding, new pelvic or abdominal pains, bloating, feeling full easy, or a change in bladder or bowel function.   Please let Dr Denman George know if you are interested in discussing a second opinion for your bladder function.  Please return to see Dr Denman George in October, 2019.

## 2017-11-08 NOTE — Progress Notes (Signed)
CERVICAL CANCER FOLLOWUP Assessment:    77 y.o. 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   S/p robotic radical hysterectomy, BSO and bilateral SLN biopsy on 04/07/15. No evidence of recurrence.  Urinary incontinence (overflow) and bladder dysfunction (lack of sensation of voiding)  Plan: 1) cervical cancer: continue 6 monthly followup  - next cytology in October 2019.  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 Continue care with Dr Matilde Sprang appreciate his assistance.  3) Surveillance: Return to clinic 6  months to evaluate for surveillance of cancer. Repeat pap in October 2019.  HPI:  Pam [name] is a 77 y.o. year old initially seen in consultation on 04/25/15 for cervical cancer, referred by Dr Benjie Karvonen.  She then underwent a robotic radical hysterectomy, BSO, bilateral pelvic sentinel lymph node biopsy on 11/22/75 without complications. Preoperative PET scan on 03/30/15 was negative for metastatic disease Her postoperative course was complicated by the development of urinary retention and overflow incontinence. She required prolonged catheterization and then intermittent self catheterization teaching.  Her final pathology revealed a 4.5cm moderately differentiated SCC of the cervix which had no LVSI, but had deep cervical stromal invasion (11 of 57mm). She had negative margins, negative nodes and negative parametrium. Her closest margin was 60mm.  She was recommended adjuvant pelvic RT to decrease risk of recurrence, but declined this after counseling with Dr Sondra Come due to patient concern regarding toxicity.   She developed postoperative urinary retention likely secondary to the radical parametrial dissection and interruption of hypogastric nerves from her radical procedure. She was encouraged to self  cath every 2hours to keep herself dry (she would have incontinence if she left it longer).  The patient was evaluated by Dr Matilde Sprang and has been continuing to self cath every 2 - 3 hours. She spontaneously voids only a little (50cc). She has some acceptance with this (less frustration).  In May, 2017 Dr Hassell Done from [place] repaired her right indirect hernia. This was an uncomplicated surgery.   Interval Hx.  She denies vaginal bleeding. Her urinary retention is stable but bothersome. She continues to see Dr McDiarmid for this. She failed to have improvement with a trial of a sacral nerve stimultator that was placed.  She reports symptoms of bladder prolapse.  Pap was normal September, 2017.  Current Outpatient Medications on File Prior to Visit  Medication Sig Dispense Refill  . Calcium Carbonate (CALCIUM 600 PO) Take 1 tablet by mouth daily.    . Cholecalciferol (VITAMIN D PO) Take 1 capsule by mouth daily.    . diclofenac (VOLTAREN) 75 MG EC tablet Take 75 mg by mouth daily.    . fexofenadine (ALLEGRA) 180 MG tablet Take 180 mg by mouth daily.    Marland Kitchen gabapentin (NEURONTIN) 100 MG capsule Take 100 mg by mouth at bedtime.  1  . OVER THE COUNTER MEDICATION Take 1 tablet by mouth daily. Move Free Ultra Triple Action for Joint Health    . rOPINIRole (REQUIP) 1 MG tablet Take 1 mg by mouth at bedtime.  0  . UNABLE TO FIND In and out foley catheter 14 Fr. Q# 20   Refills: none Insert catheter every 2 hours to empty bladder. 1 each 1   No current facility-administered medications on file prior to visit.    Allergies  Allergen Reactions  . Aspirin Nausea And Vomiting  .  Ciprofloxacin Diarrhea  . Flagyl [Metronidazole] Diarrhea  . Penicillins Hives and Other (See Comments)    Has patient had a PCN reaction causing immediate rash, facial/tongue/throat swelling, SOB or lightheadedness with hypotension: no Has patient had a PCN reaction causing severe rash involving mucus membranes or skin  necrosis: no Has patient had a PCN reaction that required hospitalization no Has patient had a PCN reaction occurring within the last 10 years: no If all of the above answers are "NO", then may proceed with Cephalosporin use.    Past Medical History:  Diagnosis Date  . Arthritis   . Cancer [facility])    BREAST CANCER / CERVICAL CANCER   . Cervical cancer ([place])   . History of breast cancer 2006   Dr. Sonny Dandy oncologist - left breast  . History of gout   . Hyperlipidemia   . Urinary retention Sept 2016   s/p hysterectomy   Past Surgical History:  Procedure Laterality Date  . INGUINAL HERNIA REPAIR Right 12/14/2015   Procedure: LAPAROSCOPIC AND OPEN RIGHT  INGUINAL HERNIA;  Surgeon: Johnathan Hausen, MD;  Location: [facility];  Service: General;  Laterality: Right;  . KNEE ARTHROSCOPY Left   . MASTECTOMY Left 2006  . ROBOTIC ASSISTED TOTAL HYSTERECTOMY WITH BILATERAL SALPINGO OOPHERECTOMY Bilateral 04/07/2015   Procedure: ROBOTIC ASSISTED RADICAL HYSTERECTOMY BILATERAL SALPINGO OOPHORECTOMY BILATERAL SENTINEL LYMPHADENETOMY;  Surgeon: Everitt Amber, MD;  Location: [facility];  Service: Gynecology;  Laterality: Bilateral;  . TUBAL LIGATION     Family History  Problem Relation Age of Onset  . Prostate cancer Father   . Heart disease Father   . Heart disease Mother   . Breast cancer Sister    Social History   Socioeconomic History  . Marital status: Married    Spouse name: Not on file  . Number of children: 1  . Years of education: Not on file  . Highest education level: Not on file  Occupational History  . Occupation: works at [employer] in [employer]  Social Needs  . Financial resource strain: Not on file  . Food insecurity:    Worry: Not on file    Inability: Not on file  . Transportation needs:    Medical: Not on file    Non-medical: Not on file  Tobacco Use  . Smoking status: Former Smoker    Packs/day: 1.00    Years: 30.00    Pack years: 30.00    Types: Cigarettes     Last attempt to quit: 07/23/1994    Years since quitting: 23.3  . Smokeless tobacco: Never Used  Substance and Sexual Activity  . Alcohol use: Never    Alcohol/week: 0.0 oz    Frequency: Never  . Drug use: Never  . Sexual activity: Not Currently  Lifestyle  . Physical activity:    Days per week: Not on file    Minutes per session: Not on file  . Stress: Not on file  Relationships  . Social connections:    Talks on phone: Not on file    Gets together: Not on file    Attends religious service: Not on file    Active member of club or organization: Not on file    Attends meetings of clubs or organizations: Not on file    Relationship status: Not on file  . Intimate partner violence:    Fear of current or ex partner: Not on file    Emotionally abused: Not on file    Physically abused: Not on  file    Forced sexual activity: Not on file  Other Topics Concern  . Not on file  Social History Narrative  . Not on file     Review of systems: Constitutional:  She has no weight gain or weight loss. She has no fever or chills. Eyes: No blurred vision Ears, Nose, Mouth, Throat: No dizziness, headaches or changes in hearing. No mouth sores. Cardiovascular: No chest pain, palpitations or edema. Respiratory:  No shortness of breath, wheezing or cough Gastrointestinal: She has normal bowel movements without diarrhea or constipation. She denies any nausea or vomiting. She denies blood in her stool or heart burn. Genitourinary:  She denies pelvic pain, pelvic pressure. She has no hematuria, dysuria, or incontinence. + urinary retention + bladder prolapse symptoms Musculoskeletal: Denies muscle weakness or joint pains Skin:  She has no skin changes, rashes or itching Neurological:  Denies dizziness or headaches. No neuropathy, no numbness or tingling. Psychiatric:  She denies depression or anxiety. Hematologic/Lymphatic:   No easy bruising or bleeding   Physical Exam: Blood pressure (!)  145/75, pulse 68, temperature 97.9 F (36.6 C), temperature source Oral, resp. rate 18, height 5\' 3"  (1.6 m), weight 126 lb 4.8 oz (57.3 kg), SpO2 99 %. General: Well dressed, well nourished in no apparent distress.   HEENT:  Normocephalic and atraumatic, no lesions.  Extraocular muscles intact. Sclerae anicteric. Pupils equal, round, reactive. No mouth sores or ulcers. Thyroid is normal size, not nodular, midline. Skin:  No lesions or rashes. Abdomen:  Soft, nontender, nondistended.  No palpable masses.  No hepatosplenomegaly.  No ascites. Normal bowel sounds. Incisions are well healed including hernia incision. Genitourinary: Normal EGBUS  No lesions. No bleeding or discharge.  No cul de sac fullness. Bladder not palpably distended with urine.  No gross prolapse noted. Pap taken. Extremities: No cyanosis, clubbing or edema.  No calf tenderness or erythema. No palpable cords. Psychiatric: Mood and affect are appropriate. Neurological: Awake, alert and oriented x 3. Sensation is intact, no neuropathy.  Musculoskeletal: No pain, normal strength and range of motion.  Thereasa Solo, MD

## 2017-11-15 DIAGNOSIS — H40013 Open angle with borderline findings, low risk, bilateral: Secondary | ICD-10-CM | POA: Diagnosis not present

## 2017-12-23 DIAGNOSIS — M170 Bilateral primary osteoarthritis of knee: Secondary | ICD-10-CM | POA: Diagnosis not present

## 2017-12-23 DIAGNOSIS — A681 Tick-borne relapsing fever: Secondary | ICD-10-CM | POA: Diagnosis not present

## 2017-12-23 DIAGNOSIS — G2581 Restless legs syndrome: Secondary | ICD-10-CM | POA: Diagnosis not present

## 2018-01-28 DIAGNOSIS — J00 Acute nasopharyngitis [common cold]: Secondary | ICD-10-CM | POA: Diagnosis not present

## 2018-01-28 DIAGNOSIS — R05 Cough: Secondary | ICD-10-CM | POA: Diagnosis not present

## 2018-01-28 DIAGNOSIS — J40 Bronchitis, not specified as acute or chronic: Secondary | ICD-10-CM | POA: Diagnosis not present

## 2018-03-01 DIAGNOSIS — Z1231 Encounter for screening mammogram for malignant neoplasm of breast: Secondary | ICD-10-CM | POA: Diagnosis not present

## 2018-03-27 DIAGNOSIS — M76822 Posterior tibial tendinitis, left leg: Secondary | ICD-10-CM | POA: Diagnosis not present

## 2018-03-27 DIAGNOSIS — M19072 Primary osteoarthritis, left ankle and foot: Secondary | ICD-10-CM | POA: Diagnosis not present

## 2018-03-27 DIAGNOSIS — M659 Synovitis and tenosynovitis, unspecified: Secondary | ICD-10-CM | POA: Diagnosis not present

## 2018-04-09 DIAGNOSIS — M170 Bilateral primary osteoarthritis of knee: Secondary | ICD-10-CM | POA: Diagnosis not present

## 2018-04-09 DIAGNOSIS — Z0000 Encounter for general adult medical examination without abnormal findings: Secondary | ICD-10-CM | POA: Diagnosis not present

## 2018-04-09 DIAGNOSIS — G2581 Restless legs syndrome: Secondary | ICD-10-CM | POA: Diagnosis not present

## 2018-04-09 DIAGNOSIS — Z1389 Encounter for screening for other disorder: Secondary | ICD-10-CM | POA: Diagnosis not present

## 2018-04-10 DIAGNOSIS — M659 Synovitis and tenosynovitis, unspecified: Secondary | ICD-10-CM | POA: Diagnosis not present

## 2018-04-10 DIAGNOSIS — M76822 Posterior tibial tendinitis, left leg: Secondary | ICD-10-CM | POA: Diagnosis not present

## 2018-04-25 DIAGNOSIS — M659 Synovitis and tenosynovitis, unspecified: Secondary | ICD-10-CM | POA: Diagnosis not present

## 2018-04-25 DIAGNOSIS — M76822 Posterior tibial tendinitis, left leg: Secondary | ICD-10-CM | POA: Diagnosis not present

## 2018-05-02 ENCOUNTER — Encounter: Payer: Self-pay | Admitting: Gynecologic Oncology

## 2018-05-02 ENCOUNTER — Other Ambulatory Visit (HOSPITAL_COMMUNITY)
Admission: RE | Admit: 2018-05-02 | Discharge: 2018-05-02 | Disposition: A | Discharge status: Home / Self Care | Payer: Medicare Other | Source: Ambulatory Visit | Attending: Gynecologic Oncology | Admitting: Gynecologic Oncology

## 2018-05-02 ENCOUNTER — Inpatient Hospital Stay: Payer: Medicare Other | Attending: Gynecologic Oncology | Admitting: Gynecologic Oncology

## 2018-05-02 VITALS — BP 132/64 | HR 75 | Temp 98.1°F | Resp 16 | Ht 63.0 in | Wt 125.0 lb

## 2018-05-02 DIAGNOSIS — Z90722 Acquired absence of ovaries, bilateral: Secondary | ICD-10-CM

## 2018-05-02 DIAGNOSIS — C539 Malignant neoplasm of cervix uteri, unspecified: Secondary | ICD-10-CM | POA: Diagnosis not present

## 2018-05-02 DIAGNOSIS — Z90710 Acquired absence of both cervix and uterus: Secondary | ICD-10-CM | POA: Insufficient documentation

## 2018-05-02 DIAGNOSIS — R87610 Atypical squamous cells of undetermined significance on cytologic smear of cervix (ASC-US): Secondary | ICD-10-CM | POA: Insufficient documentation

## 2018-05-02 DIAGNOSIS — Z08 Encounter for follow-up examination after completed treatment for malignant neoplasm: Secondary | ICD-10-CM | POA: Diagnosis not present

## 2018-05-02 DIAGNOSIS — Z8541 Personal history of malignant neoplasm of cervix uteri: Secondary | ICD-10-CM | POA: Insufficient documentation

## 2018-05-02 MED ORDER — CATHETERS MISC: 1.0000 [IU] | 10 refills | Status: DC | PRN

## 2018-05-02 NOTE — Progress Notes (Signed)
CERVICAL CANCER FOLLOWUP Assessment:    77 y.o. 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   S/p robotic radical hysterectomy, BSO and bilateral SLN biopsy on 04/07/15. No evidence of recurrence.  Urinary incontinence (overflow) and bladder dysfunction (lack of sensation of voiding)  Plan: 1) cervical cancer: continue 6 monthly followup  - next cytology in October 2019.  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 Continue care with Dr Matilde Sprang appreciate his assistance.  3) Surveillance: Return to clinic 6  months to evaluate for surveillance of cancer. Repeat pap in October 2020.  HPI:  Pam [name] is a 77 y.o. year old initially seen in consultation on 04/25/15 for cervical cancer, referred by Dr Benjie Karvonen.  She then underwent a robotic radical hysterectomy, BSO, bilateral pelvic sentinel lymph node biopsy on 6/96/29 without complications. Preoperative PET scan on 03/30/15 was negative for metastatic disease Her postoperative course was complicated by the development of urinary retention and overflow incontinence. She required prolonged catheterization and then intermittent self catheterization teaching.  Her final pathology revealed a 4.5cm moderately differentiated SCC of the cervix which had no LVSI, but had deep cervical stromal invasion (11 of 34mm). She had negative margins, negative nodes and negative parametrium. Her closest margin was 71mm.  She was recommended adjuvant pelvic RT to decrease risk of recurrence, but declined this after counseling with Dr Sondra Come due to patient concern regarding toxicity.   She developed postoperative urinary retention likely secondary to the radical parametrial dissection and interruption of hypogastric nerves from her radical procedure. She was encouraged to self  cath every 2hours to keep herself dry (she would have incontinence if she left it longer).  The patient was evaluated by Dr Matilde Sprang and has been continuing to self cath every 2 - 3 hours. She spontaneously voids only a little (50cc). She has some acceptance with this (less frustration).  In May, 2017 Dr Hassell Done from [place] repaired her right indirect hernia. This was an uncomplicated surgery.   Interval Hx.  She denies vaginal bleeding. Her urinary retention is stable but bothersome. She continues to see Dr McDiarmid for this. She failed to have improvement with a trial of a sacral nerve stimultator that was placed.  She reports symptoms of bladder prolapse.  Pap was normal September, 2017.  Current Outpatient Medications on File Prior to Visit  Medication Sig Dispense Refill  . Calcium Carbonate (CALCIUM 600 PO) Take 1 tablet by mouth daily.    . Cholecalciferol (VITAMIN D PO) Take 1 capsule by mouth daily.    . diclofenac (VOLTAREN) 75 MG EC tablet Take 75 mg by mouth daily.    . fexofenadine (ALLEGRA) 180 MG tablet Take 180 mg by mouth daily.    Marland Kitchen gabapentin (NEURONTIN) 100 MG capsule Take 100 mg by mouth at bedtime.  1  . OVER THE COUNTER MEDICATION Take 1 tablet by mouth daily. Move Free Ultra Triple Action for Joint Health    . rOPINIRole (REQUIP) 1 MG tablet Take 1 mg by mouth at bedtime.  0  . UNABLE TO FIND In and out foley catheter 14 Fr. Q# 20   Refills: none Insert catheter every 2 hours to empty bladder. 1 each 1   No current facility-administered medications on file prior to visit.    Allergies  Allergen Reactions  . Aspirin Nausea And Vomiting  .  Ciprofloxacin Diarrhea  . Flagyl [Metronidazole] Diarrhea  . Penicillins Hives and Other (See Comments)    Has patient had a PCN reaction causing immediate rash, facial/tongue/throat swelling, SOB or lightheadedness with hypotension: no Has patient had a PCN reaction causing severe rash involving mucus membranes or skin  necrosis: no Has patient had a PCN reaction that required hospitalization no Has patient had a PCN reaction occurring within the last 10 years: no If all of the above answers are "NO", then may proceed with Cephalosporin use.    Past Medical History:  Diagnosis Date  . Arthritis   . Cancer [facility])    BREAST CANCER / CERVICAL CANCER   . Cervical cancer ([place])   . History of breast cancer 2006   Dr. Sonny Dandy oncologist - left breast  . History of gout   . Hyperlipidemia   . Urinary retention Sept 2016   s/p hysterectomy   Past Surgical History:  Procedure Laterality Date  . INGUINAL HERNIA REPAIR Right 12/14/2015   Procedure: LAPAROSCOPIC AND OPEN RIGHT  INGUINAL HERNIA;  Surgeon: Johnathan Hausen, MD;  Location: [facility];  Service: General;  Laterality: Right;  . KNEE ARTHROSCOPY Left   . MASTECTOMY Left 2006  . ROBOTIC ASSISTED TOTAL HYSTERECTOMY WITH BILATERAL SALPINGO OOPHERECTOMY Bilateral 04/07/2015   Procedure: ROBOTIC ASSISTED RADICAL HYSTERECTOMY BILATERAL SALPINGO OOPHORECTOMY BILATERAL SENTINEL LYMPHADENETOMY;  Surgeon: Everitt Amber, MD;  Location: [facility];  Service: Gynecology;  Laterality: Bilateral;  . TUBAL LIGATION     Family History  Problem Relation Age of Onset  . Prostate cancer Father   . Heart disease Father   . Heart disease Mother   . Breast cancer Sister    Social History   Socioeconomic History  . Marital status: Married    Spouse name: Not on file  . Number of children: 1  . Years of education: Not on file  . Highest education level: Not on file  Occupational History  . Occupation: works at [employer] in [employer]  Social Needs  . Financial resource strain: Not on file  . Food insecurity:    Worry: Not on file    Inability: Not on file  . Transportation needs:    Medical: Not on file    Non-medical: Not on file  Tobacco Use  . Smoking status: Former Smoker    Packs/day: 1.00    Years: 30.00    Pack years: 30.00    Types: Cigarettes     Last attempt to quit: 07/23/1994    Years since quitting: 23.7  . Smokeless tobacco: Never Used  Substance and Sexual Activity  . Alcohol use: Never    Alcohol/week: 0.0 standard drinks    Frequency: Never  . Drug use: Never  . Sexual activity: Not Currently  Lifestyle  . Physical activity:    Days per week: Not on file    Minutes per session: Not on file  . Stress: Not on file  Relationships  . Social connections:    Talks on phone: Not on file    Gets together: Not on file    Attends religious service: Not on file    Active member of club or organization: Not on file    Attends meetings of clubs or organizations: Not on file    Relationship status: Not on file  . Intimate partner violence:    Fear of current or ex partner: Not on file    Emotionally abused: Not on file    Physically abused: Not  on file    Forced sexual activity: Not on file  Other Topics Concern  . Not on file  Social History Narrative  . Not on file     Review of systems: Constitutional:  She has no weight gain or weight loss. She has no fever or chills. Eyes: No blurred vision Ears, Nose, Mouth, Throat: No dizziness, headaches or changes in hearing. No mouth sores. Cardiovascular: No chest pain, palpitations or edema. Respiratory:  No shortness of breath, wheezing or cough Gastrointestinal: She has normal bowel movements without diarrhea or constipation. She denies any nausea or vomiting. She denies blood in her stool or heart burn. Genitourinary:  She denies pelvic pain, pelvic pressure. She has no hematuria, dysuria, or incontinence. + urinary retention + bladder prolapse symptoms Musculoskeletal: Denies muscle weakness or joint pains Skin:  She has no skin changes, rashes or itching Neurological:  Denies dizziness or headaches. No neuropathy, no numbness or tingling. Psychiatric:  She denies depression or anxiety. Hematologic/Lymphatic:   No easy bruising or bleeding   Physical Exam: Blood  pressure 132/64, pulse 75, temperature 98.1 F (36.7 C), temperature source Oral, resp. rate 16, height 5\' 3"  (1.6 m), weight 125 lb (56.7 kg), SpO2 96 %. General: Well dressed, well nourished in no apparent distress.   HEENT:  Normocephalic and atraumatic, no lesions.  Extraocular muscles intact. Sclerae anicteric. Pupils equal, round, reactive. No mouth sores or ulcers. Thyroid is normal size, not nodular, midline. Skin:  No lesions or rashes. Abdomen:  Soft, nontender, nondistended.  No palpable masses.  No hepatosplenomegaly.  No ascites. Normal bowel sounds. Incisions are well healed including hernia incision. Genitourinary: Normal EGBUS  No lesions. No bleeding or discharge.  No cul de sac fullness. Bladder not palpably distended with urine.  No gross prolapse noted. Pap taken. Extremities: No cyanosis, clubbing or edema.  No calf tenderness or erythema. No palpable cords. Psychiatric: Mood and affect are appropriate. Neurological: Awake, alert and oriented x 3. Sensation is intact, no neuropathy.  Musculoskeletal: No pain, normal strength and range of motion.  Thereasa Solo, MD

## 2018-05-02 NOTE — Patient Instructions (Signed)
Please return to see Dr Denman George in April, 2020. Her office will have her April 202 schedule after November 2019. Please call her office to schedule this follow-up at your convenience after that time.  Please notify Dr Denman George at phone number [phone] if you notice vaginal bleeding, new pelvic or abdominal pains, bloating, feeling full easy, or a change in bladder or bowel function.

## 2018-05-09 DIAGNOSIS — M109 Gout, unspecified: Secondary | ICD-10-CM | POA: Diagnosis not present

## 2018-05-09 DIAGNOSIS — M67372 Transient synovitis, left ankle and foot: Secondary | ICD-10-CM | POA: Diagnosis not present

## 2018-05-09 DIAGNOSIS — M19072 Primary osteoarthritis, left ankle and foot: Secondary | ICD-10-CM | POA: Diagnosis not present

## 2018-05-13 ENCOUNTER — Telehealth: Payer: Self-pay | Admitting: *Deleted

## 2018-05-13 LAB — CYTOLOGY - PAP
DIAGNOSIS: UNDETERMINED — AB
HPV: NOT DETECTED

## 2018-05-13 NOTE — Telephone Encounter (Signed)
Patient called in to obtain results from her pap smear that was done on October 11th.  Per Joylene John, NP the pap results are normal.  Patient verbalized understanding.

## 2018-05-13 NOTE — Telephone Encounter (Signed)
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  This could be due to surgical changes. HPV virus can also cause theses changes but none detected so she can have her repeat pap smear in one year per Dr. Denman George. Pt verbalized understanding.

## 2018-05-16 DIAGNOSIS — M109 Gout, unspecified: Secondary | ICD-10-CM | POA: Diagnosis not present

## 2018-05-30 DIAGNOSIS — M7752 Other enthesopathy of left foot: Secondary | ICD-10-CM | POA: Diagnosis not present

## 2018-05-30 DIAGNOSIS — M659 Synovitis and tenosynovitis, unspecified: Secondary | ICD-10-CM | POA: Diagnosis not present

## 2018-06-13 DIAGNOSIS — M659 Synovitis and tenosynovitis, unspecified: Secondary | ICD-10-CM | POA: Diagnosis not present

## 2018-06-13 DIAGNOSIS — M7752 Other enthesopathy of left foot: Secondary | ICD-10-CM | POA: Diagnosis not present

## 2018-07-18 DIAGNOSIS — M170 Bilateral primary osteoarthritis of knee: Secondary | ICD-10-CM | POA: Diagnosis not present

## 2018-07-18 DIAGNOSIS — G2581 Restless legs syndrome: Secondary | ICD-10-CM | POA: Diagnosis not present

## 2018-08-15 ENCOUNTER — Telehealth: Payer: Self-pay | Admitting: *Deleted

## 2018-08-15 NOTE — Telephone Encounter (Signed)
Patient called and scheduled her 6 month follow up  

## 2018-08-22 DIAGNOSIS — R338 Other retention of urine: Secondary | ICD-10-CM | POA: Diagnosis not present

## 2018-08-22 DIAGNOSIS — N3942 Incontinence without sensory awareness: Secondary | ICD-10-CM | POA: Diagnosis not present

## 2018-08-22 DIAGNOSIS — N1330 Unspecified hydronephrosis: Secondary | ICD-10-CM | POA: Diagnosis not present

## 2018-09-30 DIAGNOSIS — S6992XA Unspecified injury of left wrist, hand and finger(s), initial encounter: Secondary | ICD-10-CM | POA: Diagnosis not present

## 2018-09-30 DIAGNOSIS — M7989 Other specified soft tissue disorders: Secondary | ICD-10-CM | POA: Diagnosis not present

## 2018-09-30 DIAGNOSIS — M79642 Pain in left hand: Secondary | ICD-10-CM | POA: Diagnosis not present

## 2018-09-30 DIAGNOSIS — M79645 Pain in left finger(s): Secondary | ICD-10-CM | POA: Diagnosis not present

## 2018-11-07 ENCOUNTER — Ambulatory Visit: Payer: Medicare Other | Admitting: Gynecologic Oncology

## 2018-12-08 DIAGNOSIS — M170 Bilateral primary osteoarthritis of knee: Secondary | ICD-10-CM | POA: Diagnosis not present

## 2018-12-08 DIAGNOSIS — G2581 Restless legs syndrome: Secondary | ICD-10-CM | POA: Diagnosis not present

## 2018-12-08 DIAGNOSIS — K210 Gastro-esophageal reflux disease with esophagitis: Secondary | ICD-10-CM | POA: Diagnosis not present

## 2018-12-11 DIAGNOSIS — M1712 Unilateral primary osteoarthritis, left knee: Secondary | ICD-10-CM | POA: Diagnosis not present

## 2018-12-11 DIAGNOSIS — M25562 Pain in left knee: Secondary | ICD-10-CM | POA: Diagnosis not present

## 2019-01-02 ENCOUNTER — Inpatient Hospital Stay: Payer: Medicare Other | Attending: Gynecologic Oncology | Admitting: Gynecologic Oncology

## 2019-01-02 ENCOUNTER — Other Ambulatory Visit: Payer: Self-pay

## 2019-01-02 ENCOUNTER — Encounter: Payer: Self-pay | Admitting: Gynecologic Oncology

## 2019-01-02 VITALS — BP 138/67 | HR 88 | Temp 98.7°F | Resp 17 | Ht 63.0 in | Wt 130.5 lb

## 2019-01-02 DIAGNOSIS — Z90722 Acquired absence of ovaries, bilateral: Secondary | ICD-10-CM | POA: Diagnosis not present

## 2019-01-02 DIAGNOSIS — Z90710 Acquired absence of both cervix and uterus: Secondary | ICD-10-CM

## 2019-01-02 DIAGNOSIS — C539 Malignant neoplasm of cervix uteri, unspecified: Secondary | ICD-10-CM | POA: Diagnosis not present

## 2019-01-02 DIAGNOSIS — B3731 Acute candidiasis of vulva and vagina: Secondary | ICD-10-CM

## 2019-01-02 DIAGNOSIS — B373 Candidiasis of vulva and vagina: Secondary | ICD-10-CM

## 2019-01-02 DIAGNOSIS — N39490 Overflow incontinence: Secondary | ICD-10-CM

## 2019-01-02 MED ORDER — CATHETERS MISC: 1.0000 [IU] | 10 refills | Status: DC | PRN

## 2019-01-02 NOTE — Progress Notes (Signed)
CERVICAL CANCER FOLLOWUP Assessment:    78 y.o. 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   S/p robotic radical hysterectomy, BSO and bilateral SLN biopsy on 04/07/15. No evidence of recurrence.  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 Continue care with Dr Matilde Sprang appreciate his assistance.  3) Surveillance: Return to clinic 6  months to evaluate for surveillance of cancer. Repeat pap in October 2020.  HPI:  Pam [name] is a 79 y.o. year old initially seen in consultation on 04/25/15 for cervical cancer, referred by Dr Benjie Karvonen.  She then underwent a robotic radical hysterectomy, BSO, bilateral pelvic sentinel lymph node biopsy on 04/15/25 without complications. Preoperative PET scan on 03/30/15 was negative for metastatic disease Her postoperative course was complicated by the development of urinary retention and overflow incontinence. She required prolonged catheterization and then intermittent self catheterization teaching.  Her final pathology revealed a 4.5cm moderately differentiated SCC of the cervix which had no LVSI, but had deep cervical stromal invasion (11 of 8mm). She had negative margins, negative nodes and negative parametrium. Her closest margin was 95mm.  She was recommended adjuvant pelvic RT to decrease risk of recurrence, but declined this after counseling with Dr Sondra Come due to patient concern regarding toxicity.   She developed postoperative urinary retention likely secondary to the radical parametrial dissection and interruption of hypogastric nerves from her radical procedure. She was encouraged to self cath every 2hours to keep herself  dry (she would have incontinence if she left it longer).  The patient was evaluated by Dr Matilde Sprang and has been continuing to self cath every 2 - 3 hours. She spontaneously voids only a little (50cc). She has some acceptance with this (less frustration).  In May, 2017 Dr Hassell Done from [place] repaired her right indirect hernia. This was an uncomplicated surgery.   Interval Hx.  She denies vaginal bleeding. Her urinary retention is stable but bothersome. She continues to see Dr McDiarmid for this. She failed to have improvement with a trial of a sacral nerve stimultator that was placed.  She reports symptoms of bladder prolapse.  Pap was normal September, 2017 and October, 2018. Pap showed ASCUS in October, 2019 but was negative for high risk HPV.   Today we discussed survivorship issues including: skin cancer prevention, screening for non-gyn cancers, long-term side effects/toxicity of surgery, emotional well-being.  Current Outpatient Medications on File Prior to Visit  Medication Sig Dispense Refill  . Calcium Carbonate (CALCIUM 600 PO) Take 1 tablet by mouth daily.    . Cholecalciferol (VITAMIN D PO) Take 1 capsule by mouth daily.    . diclofenac (VOLTAREN) 75 MG EC tablet Take 75 mg by mouth daily.    . fexofenadine (ALLEGRA) 180 MG tablet Take 180 mg by mouth daily.    Marland Kitchen gabapentin (NEURONTIN) 100 MG capsule Take 100 mg by mouth at bedtime.  1  . OVER THE COUNTER MEDICATION Take 1 tablet by mouth daily. Move Free Ultra Triple Action for Joint Health    . rOPINIRole (REQUIP) 1 MG tablet Take 1 mg by mouth at bedtime.  0  . UNABLE TO FIND In and out foley catheter 14 Fr. Q# 20   Refills: none Insert catheter every  2 hours to empty bladder. 1 each 1   No current facility-administered medications on file prior to visit.    Allergies  Allergen Reactions  . Aspirin Nausea And Vomiting  . Ciprofloxacin Diarrhea  . Flagyl [Metronidazole] Diarrhea  . Penicillins Hives and Other (See  Comments)    Has patient had a PCN reaction causing immediate rash, facial/tongue/throat swelling, SOB or lightheadedness with hypotension: no Has patient had a PCN reaction causing severe rash involving mucus membranes or skin necrosis: no Has patient had a PCN reaction that required hospitalization no Has patient had a PCN reaction occurring within the last 10 years: no If all of the above answers are "NO", then may proceed with Cephalosporin use.    Past Medical History:  Diagnosis Date  . Arthritis   . Cancer [facility])    BREAST CANCER / CERVICAL CANCER   . Cervical cancer ([place])   . History of breast cancer 2006   Dr. Sonny Dandy oncologist - left breast  . History of gout   . Hyperlipidemia   . Urinary retention Sept 2016   s/p hysterectomy   Past Surgical History:  Procedure Laterality Date  . INGUINAL HERNIA REPAIR Right 12/14/2015   Procedure: LAPAROSCOPIC AND OPEN RIGHT  INGUINAL HERNIA;  Surgeon: Johnathan Hausen, MD;  Location: [facility];  Service: General;  Laterality: Right;  . KNEE ARTHROSCOPY Left   . MASTECTOMY Left 2006  . ROBOTIC ASSISTED TOTAL HYSTERECTOMY WITH BILATERAL SALPINGO OOPHERECTOMY Bilateral 04/07/2015   Procedure: ROBOTIC ASSISTED RADICAL HYSTERECTOMY BILATERAL SALPINGO OOPHORECTOMY BILATERAL SENTINEL LYMPHADENETOMY;  Surgeon: Everitt Amber, MD;  Location: [facility];  Service: Gynecology;  Laterality: Bilateral;  . TUBAL LIGATION     Family History  Problem Relation Age of Onset  . Prostate cancer Father   . Heart disease Father   . Heart disease Mother   . Breast cancer Sister    Social History   Socioeconomic History  . Marital status: Married    Spouse name: Not on file  . Number of children: 1  . Years of education: Not on file  . Highest education level: Not on file  Occupational History  . Occupation: works at [employer] in [employer]  Social Needs  . Financial resource strain: Not on file  . Food insecurity    Worry: Not on file     Inability: Not on file  . Transportation needs    Medical: Not on file    Non-medical: Not on file  Tobacco Use  . Smoking status: Former Smoker    Packs/day: 1.00    Years: 30.00    Pack years: 30.00    Types: Cigarettes    Quit date: 07/23/1994    Years since quitting: 24.4  . Smokeless tobacco: Never Used  Substance and Sexual Activity  . Alcohol use: Never    Alcohol/week: 0.0 standard drinks    Frequency: Never  . Drug use: Never  . Sexual activity: Not Currently  Lifestyle  . Physical activity    Days per week: Not on file    Minutes per session: Not on file  . Stress: Not on file  Relationships  . Social Herbalist on phone: Not on file    Gets together: Not on file    Attends religious service: Not on file    Active member of club or organization: Not on file    Attends meetings of clubs or organizations: Not on file    Relationship status: Not on  file  . Intimate partner violence    Fear of current or ex partner: Not on file    Emotionally abused: Not on file    Physically abused: Not on file    Forced sexual activity: Not on file  Other Topics Concern  . Not on file  Social History Narrative  . Not on file     Review of systems: Constitutional:  She has no weight gain or weight loss. She has no fever or chills. Eyes: No blurred vision Ears, Nose, Mouth, Throat: No dizziness, headaches or changes in hearing. No mouth sores. Cardiovascular: No chest pain, palpitations or edema. Respiratory:  No shortness of breath, wheezing or cough Gastrointestinal: She has normal bowel movements without diarrhea or constipation. She denies any nausea or vomiting. She denies blood in her stool or heart burn. Genitourinary:  She denies pelvic pain, pelvic pressure. She has no hematuria, dysuria, or incontinence. + urinary retention + bladder prolapse symptoms Musculoskeletal: Denies muscle weakness or joint pains Skin:  She has no skin changes, rashes or  itching Neurological:  Denies dizziness or headaches. No neuropathy, no numbness or tingling. Psychiatric:  She denies depression or anxiety. Hematologic/Lymphatic:   No easy bruising or bleeding   Physical Exam: Blood pressure 138/67, pulse 88, temperature 98.7 F (37.1 C), temperature source Oral, resp. rate 17, height 5\' 3"  (1.6 m), weight 130 lb 8 oz (59.2 kg), SpO2 99 %. General: Well dressed, well nourished in no apparent distress.   HEENT:  Normocephalic and atraumatic, no lesions.  Extraocular muscles intact. Sclerae anicteric. Pupils equal, round, reactive. No mouth sores or ulcers. Thyroid is normal size, not nodular, midline. Skin:  No lesions or rashes. Abdomen:  Soft, nontender, nondistended.  No palpable masses.  No hepatosplenomegaly.  No ascites. Normal bowel sounds. Incisions are well healed including hernia incision. Genitourinary: Normal EGBUS  No lesions. No bleeding or discharge.  No cul de sac fullness. Bladder not palpably distended with urine.  No gross prolapse noted.  Extremities: No cyanosis, clubbing or edema.  No calf tenderness or erythema. No palpable cords. Psychiatric: Mood and affect are appropriate. Neurological: Awake, alert and oriented x 3. Sensation is intact, no neuropathy.  Musculoskeletal: No pain, normal strength and range of motion.  Thereasa Solo, MD

## 2019-01-02 NOTE — Patient Instructions (Signed)
Please notify Dr Denman George at phone number [phone] if you notice vaginal bleeding, new pelvic or abdominal pains, bloating, feeling full easy, or a change in bladder or bowel function.   Please contact Dr Serita Grit office (at [phone]) in September, 2020 to request an appointment with her for December, 2020. She will do a pap test at that time.

## 2019-01-19 DIAGNOSIS — H40013 Open angle with borderline findings, low risk, bilateral: Secondary | ICD-10-CM | POA: Diagnosis not present

## 2019-01-19 DIAGNOSIS — H2513 Age-related nuclear cataract, bilateral: Secondary | ICD-10-CM | POA: Diagnosis not present

## 2019-02-17 DIAGNOSIS — H18413 Arcus senilis, bilateral: Secondary | ICD-10-CM | POA: Diagnosis not present

## 2019-02-17 DIAGNOSIS — H2512 Age-related nuclear cataract, left eye: Secondary | ICD-10-CM | POA: Diagnosis not present

## 2019-02-17 DIAGNOSIS — H2513 Age-related nuclear cataract, bilateral: Secondary | ICD-10-CM | POA: Diagnosis not present

## 2019-02-17 DIAGNOSIS — H25043 Posterior subcapsular polar age-related cataract, bilateral: Secondary | ICD-10-CM | POA: Diagnosis not present

## 2019-02-17 DIAGNOSIS — H25013 Cortical age-related cataract, bilateral: Secondary | ICD-10-CM | POA: Diagnosis not present

## 2019-02-18 ENCOUNTER — Other Ambulatory Visit: Payer: Self-pay

## 2019-02-24 DIAGNOSIS — I480 Paroxysmal atrial fibrillation: Secondary | ICD-10-CM | POA: Diagnosis not present

## 2019-03-06 DIAGNOSIS — Z1231 Encounter for screening mammogram for malignant neoplasm of breast: Secondary | ICD-10-CM | POA: Diagnosis not present

## 2019-03-06 DIAGNOSIS — Z853 Personal history of malignant neoplasm of breast: Secondary | ICD-10-CM | POA: Diagnosis not present

## 2019-03-16 DIAGNOSIS — H2512 Age-related nuclear cataract, left eye: Secondary | ICD-10-CM | POA: Diagnosis not present

## 2019-03-16 DIAGNOSIS — H2513 Age-related nuclear cataract, bilateral: Secondary | ICD-10-CM | POA: Diagnosis not present

## 2019-03-17 DIAGNOSIS — H2511 Age-related nuclear cataract, right eye: Secondary | ICD-10-CM | POA: Diagnosis not present

## 2019-03-17 DIAGNOSIS — H25011 Cortical age-related cataract, right eye: Secondary | ICD-10-CM | POA: Diagnosis not present

## 2019-03-17 DIAGNOSIS — H25041 Posterior subcapsular polar age-related cataract, right eye: Secondary | ICD-10-CM | POA: Diagnosis not present

## 2019-03-23 DIAGNOSIS — H2513 Age-related nuclear cataract, bilateral: Secondary | ICD-10-CM | POA: Diagnosis not present

## 2019-03-24 ENCOUNTER — Telehealth: Payer: Self-pay | Admitting: *Deleted

## 2019-03-24 NOTE — Telephone Encounter (Signed)
Returned the patient's call and scheduled a follow up appt

## 2019-04-06 DIAGNOSIS — H2511 Age-related nuclear cataract, right eye: Secondary | ICD-10-CM | POA: Diagnosis not present

## 2019-04-06 DIAGNOSIS — H2513 Age-related nuclear cataract, bilateral: Secondary | ICD-10-CM | POA: Diagnosis not present

## 2019-04-12 DIAGNOSIS — M25562 Pain in left knee: Secondary | ICD-10-CM | POA: Diagnosis not present

## 2019-04-12 DIAGNOSIS — M1712 Unilateral primary osteoarthritis, left knee: Secondary | ICD-10-CM | POA: Diagnosis not present

## 2019-04-14 NOTE — Progress Notes (Signed)
Cardiology Office Note   Date:  04/15/2019   ID:  Pam, [name] Apr 13, 1941, MRN [id number]  PCP:  Neale Burly, MD  Cardiologist:   No primary care provider on file. Referring:  Neale Burly, MD  Chief Complaint  Patient presents with  . Atrial Fibrillation      History of Present Illness: Pam [name] is a 78 y.o. female who is referred by Neale Burly, MD for evaluation of atrial fib.  She saw Dr. Ron Parker years ago.  She does not member what this was about.  She is had no significant cardiac history.  She was noted recently to have a rapid heart rate and was in atrial fibrillation.  She does not really feel this.  She notices, that her heart rate runs high.  When she saw her primary provider she started on Cardizem and Eliquis.  She denies any palpitations, presyncope or syncope.  She has no chest pressure, neck or arm discomfort.  He has had no weight gain or edema.  She is very active.  She has had no cardiovascular symptoms.  She has had no significant work-up or history in the past.  Past Medical History:  Diagnosis Date  . Arthritis   . Cancer [facility])    BREAST CANCER / CERVICAL CANCER   . Cervical cancer ([place])   . History of breast cancer 2006   Dr. Sonny Dandy oncologist - left breast  . History of gout   . Hyperlipidemia   . Urinary retention Sept 2016   s/p hysterectomy    Past Surgical History:  Procedure Laterality Date  . INGUINAL HERNIA REPAIR Right 12/14/2015   Procedure: LAPAROSCOPIC AND OPEN RIGHT  INGUINAL HERNIA;  Surgeon: Johnathan Hausen, MD;  Location: [facility];  Service: General;  Laterality: Right;  . KNEE ARTHROSCOPY Left   . MASTECTOMY Left 2006  . ROBOTIC ASSISTED TOTAL HYSTERECTOMY WITH BILATERAL SALPINGO OOPHERECTOMY Bilateral 04/07/2015   Procedure: ROBOTIC ASSISTED RADICAL HYSTERECTOMY BILATERAL SALPINGO OOPHORECTOMY BILATERAL SENTINEL LYMPHADENETOMY;  Surgeon: Everitt Amber, MD;  Location: [facility];  Service: Gynecology;  Laterality:  Bilateral;  . TUBAL LIGATION       Current Outpatient Medications  Medication Sig Dispense Refill  . apixaban (ELIQUIS) 5 MG TABS tablet Take 5 mg by mouth 2 (two) times daily.     . Calcium Carbonate (CALCIUM 600 PO) Take 1 tablet by mouth daily.    . Cholecalciferol (VITAMIN D PO) Take 1 capsule by mouth daily.    . diclofenac (VOLTAREN) 75 MG EC tablet Take 75 mg by mouth daily.    Marland Kitchen gabapentin (NEURONTIN) 100 MG capsule Take 100 mg by mouth at bedtime.  1  . OVER THE COUNTER MEDICATION Take 1 tablet by mouth daily. Move Free Ultra Triple Action for Joint Health    . rOPINIRole (REQUIP) 1 MG tablet Take 1 mg by mouth at bedtime.  0  . Catheters MISC 1 Units by Does not apply route every 3 (three) hours as needed. 60 each 10  . diltiazem (CARDIZEM LA) 300 MG 24 hr tablet Take 1 tablet (300 mg total) by mouth daily. 90 tablet 3  . fexofenadine (ALLEGRA) 180 MG tablet Take 180 mg by mouth daily.    Marland Kitchen UNABLE TO FIND In and out foley catheter 14 Fr. Q# 20   Refills: none Insert catheter every 2 hours to empty bladder. 1 each 1   No current facility-administered medications for this visit.  Allergies:   Aspirin, Ciprofloxacin, Flagyl [metronidazole], and Penicillins    Social History:  The patient  reports that she quit smoking about 24 years ago. Her smoking use included cigarettes. She has a 30.00 pack-year smoking history. She has never used smokeless tobacco. She reports that she does not drink alcohol or use drugs.   Family History:  The patient's family history includes Breast cancer in her sister; Heart failure in her father and mother; Prostate cancer in her father.    ROS:  Please see the history of present illness.   Otherwise, review of systems are positive for none.   All other systems are reviewed and negative.    PHYSICAL EXAM: VS:  BP 130/82   Pulse (!) 136   Ht 5\' 3"  (1.6 m)   Wt 136 lb (61.7 kg)   BMI 24.09 kg/m  , BMI Body mass index is 24.09 kg/m.  GENERAL:  Well appearing HEENT:  Pupils equal round and reactive, fundi not visualized, oral mucosa unremarkable NECK:  No jugular venous distention, waveform within normal limits, carotid upstroke brisk and symmetric, no bruits, no thyromegaly LYMPHATICS:  No cervical, inguinal adenopathy LUNGS:  Clear to auscultation bilaterally BACK:  No CVA tenderness CHEST:  Unremarkable HEART:  PMI not displaced or sustained,S1 and S2 within normal limits, no S3,  no clicks, no rubs, no murmurs, irregular ABD:  Flat, positive bowel sounds normal in frequency in pitch, no bruits, no rebound, no guarding, no midline pulsatile mass, no hepatomegaly, no splenomegaly EXT:  2 plus pulses throughout, no edema, no cyanosis no clubbing SKIN:  No rashes no nodules NEURO:  Cranial nerves II through XII grossly intact, motor grossly intact throughout PSYCH:  Cognitively intact, oriented to person place and time    EKG:  EKG is ordered today. The ekg ordered today demonstrates atrial fibrillation rate 136, axis within normal limits, intervals within normal limits, no acute ST-T wave changes.  Nonspecific T wave flattening.   Recent Labs: No results found for requested labs within last 8760 hours.    Lipid Panel No results found for: CHOL, TRIG, HDL, CHOLHDL, VLDL, LDLCALC, LDLDIRECT    Wt Readings from Last 3 Encounters:  04/15/19 136 lb (61.7 kg)  01/02/19 130 lb 8 oz (59.2 kg)  05/02/18 125 lb (56.7 kg)      Other studies Reviewed: Additional studies/ records that were reviewed today include: Office record. Review of the above records demonstrates:  Please see elsewhere in the note.     ASSESSMENT AND PLAN  ATRIAL FIB:   Ms. [name] has a CHA2DS2 - VASc score of 3.  She has no contraindication to anticoagulation.  She actually does not feel this.  I do not think to be a reason for rhythm control but rather rate control and anticoagulation.  I am going to increase her Cardizem to 300 mg  daily.  I will make sure she has had TSH, CBC and BMET.  On make sure she is on the appropriate dose of Eliquis.  Get an echocardiogram.  She will be able to track her heart rate control with her Fitbit and I will see her back to discuss this further.  I went over the physiology of this at length with the patient and her daughter.  DYSLIPIDEMIA:  Per Neale Burly, MD    Current medicines are reviewed at length with the patient today.  The patient does not have concerns regarding medicines.  The following changes have been  made:  no change  Labs/ tests ordered today include:   Orders Placed This Encounter  Procedures  . CBC  . TSH  . Basic metabolic panel  . EKG 12-Lead  . ECHOCARDIOGRAM COMPLETE     Disposition:   FU with me in about one month.      Signed, Minus Breeding, MD  04/15/2019 11:20 AM    [place]

## 2019-04-15 ENCOUNTER — Ambulatory Visit (INDEPENDENT_AMBULATORY_CARE_PROVIDER_SITE_OTHER): Payer: Medicare Other | Admitting: Cardiology

## 2019-04-15 ENCOUNTER — Other Ambulatory Visit: Payer: Self-pay

## 2019-04-15 ENCOUNTER — Encounter: Payer: Self-pay | Admitting: Cardiology

## 2019-04-15 VITALS — BP 130/82 | HR 136 | Ht 63.0 in | Wt 136.0 lb

## 2019-04-15 DIAGNOSIS — I4891 Unspecified atrial fibrillation: Secondary | ICD-10-CM

## 2019-04-15 DIAGNOSIS — Z79899 Other long term (current) drug therapy: Secondary | ICD-10-CM | POA: Diagnosis not present

## 2019-04-15 DIAGNOSIS — E785 Hyperlipidemia, unspecified: Secondary | ICD-10-CM

## 2019-04-15 MED ORDER — DILTIAZEM HCL ER COATED BEADS 300 MG PO TB24: 300.0000 mg | ORAL_TABLET | Freq: Every day | ORAL | 3 refills | Status: DC

## 2019-04-15 NOTE — Patient Instructions (Addendum)
Medication Instructions:  Please increase your Cardizem to 300 mg a day.  Continue all other medications as listed.  If you need a refill on your cardiac medications before your next appointment, please call your pharmacy.   Lab work: Please have blood work same day as your echocardiogram at [facility]. (TSH, CBC, BMP)  If you have labs (blood work) drawn today and your tests are completely normal, you will receive your results only by: Marland Kitchen MyChart Message (if you have MyChart) OR . A paper copy in the mail If you have any lab test that is abnormal or we need to change your treatment, we will call you to review the results.  Testing/Procedures: Your physician has requested that you have an echocardiogram. Echocardiography is a painless test that uses sound waves to create images of your heart. It provides your doctor with information about the size and shape of your heart and how well your heart's chambers and valves are working. This procedure takes approximately one hour. There are no restrictions for this procedure. Please check in at the main entrance of [facility]: Follow up in 1 month with Dr [name] in [place].  Thank you for choosing [facility]!!

## 2019-04-23 ENCOUNTER — Ambulatory Visit (HOSPITAL_COMMUNITY)
Admission: RE | Admit: 2019-04-23 | Discharge: 2019-04-23 | Disposition: A | Discharge status: Home / Self Care | Payer: Medicare Other | Source: Ambulatory Visit | Attending: Cardiology | Admitting: Cardiology

## 2019-04-23 ENCOUNTER — Other Ambulatory Visit (HOSPITAL_COMMUNITY)
Admission: RE | Admit: 2019-04-23 | Discharge: 2019-04-23 | Disposition: A | Discharge status: Home / Self Care | Payer: Medicare Other | Source: Ambulatory Visit | Attending: Internal Medicine | Admitting: Internal Medicine

## 2019-04-23 ENCOUNTER — Other Ambulatory Visit: Payer: Self-pay

## 2019-04-23 DIAGNOSIS — I4891 Unspecified atrial fibrillation: Secondary | ICD-10-CM | POA: Diagnosis not present

## 2019-04-23 LAB — CBC
HCT: 41.1 % (ref 36.0–46.0)
Hemoglobin: 13.2 g/dL (ref 12.0–15.0)
MCH: 29.5 pg (ref 26.0–34.0)
MCHC: 32.1 g/dL (ref 30.0–36.0)
MCV: 91.7 fL (ref 80.0–100.0)
Platelets: 294 10*3/uL (ref 150–400)
RBC: 4.48 MIL/uL (ref 3.87–5.11)
RDW: 12.6 % (ref 11.5–15.5)
WBC: 9 10*3/uL (ref 4.0–10.5)
nRBC: 0 % (ref 0.0–0.2)

## 2019-04-23 LAB — BASIC METABOLIC PANEL
Anion gap: 8 (ref 5–15)
BUN: 13 mg/dL (ref 8–23)
CO2: 28 mmol/L (ref 22–32)
Calcium: 8.9 mg/dL (ref 8.9–10.3)
Chloride: 103 mmol/L (ref 98–111)
Creatinine, Ser: 0.86 mg/dL (ref 0.44–1.00)
GFR calc Af Amer: >60 mL/min (ref >60)
GFR calc non Af Amer: >60 mL/min (ref >60)
Glucose, Bld: 122 mg/dL — ABNORMAL HIGH (ref 70–99)
Potassium: 4.6 mmol/L (ref 3.5–5.1)
Sodium: 139 mmol/L (ref 135–145)

## 2019-04-23 LAB — TSH: TSH: 0.598 u[IU]/mL (ref 0.350–4.500)

## 2019-04-23 NOTE — Progress Notes (Signed)
*  PRELIMINARY RESULTS* Echocardiogram 2D Echocardiogram has been performed.  Samuel Germany 04/23/2019, 2:22 PM

## 2019-04-27 ENCOUNTER — Telehealth: Payer: Self-pay | Admitting: Cardiology

## 2019-04-27 NOTE — Telephone Encounter (Signed)
Patient called w/echo results  Notes recorded by Minus Breeding, MD on 04/23/2019 at 9:06 PM EDT  The patient has normal LV function. There might be a small patent foramen ovale. However, this does not change therapy or plans. Call Ms. Warga with the results and send results to Neale Burly, MD

## 2019-04-27 NOTE — Telephone Encounter (Signed)
New message ° ° °Patient is returning call about lab results. Please call. °

## 2019-04-29 ENCOUNTER — Other Ambulatory Visit: Payer: Self-pay | Admitting: Dermatology

## 2019-04-29 DIAGNOSIS — L570 Actinic keratosis: Secondary | ICD-10-CM | POA: Diagnosis not present

## 2019-04-29 DIAGNOSIS — D485 Neoplasm of uncertain behavior of skin: Secondary | ICD-10-CM | POA: Diagnosis not present

## 2019-04-29 DIAGNOSIS — D692 Other nonthrombocytopenic purpura: Secondary | ICD-10-CM | POA: Diagnosis not present

## 2019-05-26 NOTE — Progress Notes (Signed)
Cardiology Office Note   Date:  05/27/2019   ID:  Pam, [name] [birth year], MRN [id number]  PCP:  Neale Burly, MD  Cardiologist:   No primary care provider on file. Referring:  Neale Burly, MD  Chief Complaint  Patient presents with  . Atrial Fibrillation      History of Present Illness: Pam [name] is a 78 y.o. female who is referred by Neale Burly, MD for evaluation of atrial fib.   After I last saw her I ordered an echo which was unremarkable. Since I last saw her she still remains asymptomatic with her fibrillation. The patient denies any new symptoms such as chest discomfort, neck or arm discomfort. There has been no new shortness of breath, PND or orthopnea. There have been no reported palpitations, presyncope or syncope. She tolerated increased dose of Cardizem. She thinks her heart rates better controlled and she is watching it on a pulse ox. It will still go up occasionally to the 110s. She is doing okay with the blood thinner. She is not having any evidence of GI bleeding with dark black stools red blood. Unfortunately she cannot afford the Eliquis and will have to switch to warfarin. She is also getting anemic that because it is painful and has been referred to orthopedics. Of note she had an echo which demonstrated well-preserved ejection fraction. She does have a small patent foramen ovale.   Past Medical History:  Diagnosis Date  . Arthritis   . Cancer [facility])    BREAST CANCER / CERVICAL CANCER   . Cervical cancer ([place])   . History of breast cancer 2006   Dr. Sonny Dandy oncologist - left breast  . History of gout   . Hyperlipidemia   . Urinary retention Sept 2016   s/p hysterectomy    Past Surgical History:  Procedure Laterality Date  . INGUINAL HERNIA REPAIR Right 12/14/2015   Procedure: LAPAROSCOPIC AND OPEN RIGHT  INGUINAL HERNIA;  Surgeon: Johnathan Hausen, MD;  Location: [facility];  Service: General;  Laterality: Right;  . KNEE ARTHROSCOPY Left    . MASTECTOMY Left 2006  . ROBOTIC ASSISTED TOTAL HYSTERECTOMY WITH BILATERAL SALPINGO OOPHERECTOMY Bilateral 04/07/2015   Procedure: ROBOTIC ASSISTED RADICAL HYSTERECTOMY BILATERAL SALPINGO OOPHORECTOMY BILATERAL SENTINEL LYMPHADENETOMY;  Surgeon: Everitt Amber, MD;  Location: [facility];  Service: Gynecology;  Laterality: Bilateral;  . TUBAL LIGATION       Current Outpatient Medications  Medication Sig Dispense Refill  . apixaban (ELIQUIS) 5 MG TABS tablet Take 5 mg by mouth 2 (two) times daily.     . Calcium Carbonate (CALCIUM 600 PO) Take 1 tablet by mouth daily.    . Cholecalciferol (VITAMIN D PO) Take 1 capsule by mouth daily.    . diclofenac (VOLTAREN) 75 MG EC tablet Take 75 mg by mouth daily.    Marland Kitchen diltiazem (CARDIZEM LA) 300 MG 24 hr tablet Take 1 tablet (300 mg total) by mouth daily. 90 tablet 3  . fexofenadine (ALLEGRA) 180 MG tablet Take 180 mg by mouth daily.    Marland Kitchen gabapentin (NEURONTIN) 100 MG capsule Take 100 mg by mouth at bedtime.  1  . OVER THE COUNTER MEDICATION Take 1 tablet by mouth daily. Move Free Ultra Triple Action for Joint Health    . rOPINIRole (REQUIP) 1 MG tablet Take 1 mg by mouth at bedtime.  0  . UNABLE TO FIND In and out foley catheter 14 Fr. Q# 20   Refills: none Insert  catheter every 2 hours to empty bladder. 1 each 1  . Catheters MISC 1 Units by Does not apply route every 3 (three) hours as needed. 60 each 10   No current facility-administered medications for this visit.     Allergies:   Aspirin, Ciprofloxacin, Flagyl [metronidazole], and Penicillins    ROS:  Please see the history of present illness.   Otherwise, review of systems are positive for none.   All other systems are reviewed and negative.    PHYSICAL EXAM: VS:  BP 130/80   Pulse 88   Ht 5\' 3"  (1.6 m)   Wt 137 lb (62.1 kg)   BMI 24.27 kg/m  , BMI Body mass index is 24.27 kg/m. GENERAL:  Well appearing NECK:  No jugular venous distention, waveform within normal limits, carotid upstroke  brisk and symmetric, no bruits, no thyromegaly LUNGS:  Clear to auscultation bilaterally CHEST:  Unremarkable HEART:  PMI not displaced or sustained,S1 and S2 within normal limits, no S3, no clicks, no rubs, no murmurs, irregular ABD:  Flat, positive bowel sounds normal in frequency in pitch, no bruits, no rebound, no guarding, no midline pulsatile mass, no hepatomegaly, no splenomegaly EXT:  2 plus pulses throughout, no edema, no cyanosis no clubbing   EKG:  EKG is not ordered today.   Recent Labs: 04/23/2019: BUN 13; Creatinine, Ser 0.86; Hemoglobin 13.2; Platelets 294; Potassium 4.6; Sodium 139; TSH 0.598    Lipid Panel No results found for: CHOL, TRIG, HDL, CHOLHDL, VLDL, LDLCALC, LDLDIRECT    Wt Readings from Last 3 Encounters:  05/27/19 137 lb (62.1 kg)  04/15/19 136 lb (61.7 kg)  01/02/19 130 lb 8 oz (59.2 kg)      Other studies Reviewed: Additional studies/ records that were reviewed today include: Echo. Review of the above records demonstrates:  Please see elsewhere in the note.     ASSESSMENT AND PLAN  ATRIAL FIB:   Ms. Pam [name] has a CHA2DS2 - VASc score of 3.   Given results of recently released EAST AFNET study to be a little more aggressive with early onset fibrillation and I do think that cardioversion is indicated. It is much easier timing wise if we do this on Eliquis and get a try to get her scheduled soon and to get enough Eliquis. The duration of the cardioversion and 4 weeks after were before she switches to warfarin. I gave her rate control where it is. I discussed the risk benefits of cardioversion and she agrees to proceed.  PFO: I discussed this with her. This is an incidental finding. No change in therapy.   Current medicines are reviewed at length with the patient today.  The patient does not have concerns regarding medicines.  The following changes have been made:  None  Labs/ tests ordered today include:   No orders of the defined types  were placed in this encounter.    Disposition:   FU with me in about after the cardioversion.   Signed, Minus Breeding, MD  05/27/2019 12:08 PM    [place]

## 2019-05-26 NOTE — H&P (View-Only) (Signed)
Cardiology Office Note   Date:  05/27/2019   ID:  Pam [name], Pam [name] 1941-01-07, MRN [id number]  PCP:  Neale Burly, MD  Cardiologist:   No primary care provider on file. Referring:  Neale Burly, MD  Chief Complaint  Patient presents with  . Atrial Fibrillation      History of Present Illness: Pam [name] is a 78 y.o. female who is referred by Neale Burly, MD for evaluation of atrial fib.   After I last saw her I ordered an echo which was unremarkable. Since I last saw her she still remains asymptomatic with her fibrillation. The patient denies any new symptoms such as chest discomfort, neck or arm discomfort. There has been no new shortness of breath, PND or orthopnea. There have been no reported palpitations, presyncope or syncope. She tolerated increased dose of Cardizem. She thinks her heart rates better controlled and she is watching it on a pulse ox. It will still go up occasionally to the 110s. She is doing okay with the blood thinner. She is not having any evidence of GI bleeding with dark black stools red blood. Unfortunately she cannot afford the Eliquis and will have to switch to warfarin. She is also getting anemic that because it is painful and has been referred to orthopedics. Of note she had an echo which demonstrated well-preserved ejection fraction. She does have a small patent foramen ovale.   Past Medical History:  Diagnosis Date  . Arthritis   . Cancer [facility])    BREAST CANCER / CERVICAL CANCER   . Cervical cancer ([place])   . History of breast cancer 2006   Dr. Sonny Dandy oncologist - left breast  . History of gout   . Hyperlipidemia   . Urinary retention Sept 2016   s/p hysterectomy    Past Surgical History:  Procedure Laterality Date  . INGUINAL HERNIA REPAIR Right 12/14/2015   Procedure: LAPAROSCOPIC AND OPEN RIGHT  INGUINAL HERNIA;  Surgeon: Johnathan Hausen, MD;  [facility];  Service: General;  Laterality: Right;  . KNEE ARTHROSCOPY Left    . MASTECTOMY Left 2006  . ROBOTIC ASSISTED TOTAL HYSTERECTOMY WITH BILATERAL SALPINGO OOPHERECTOMY Bilateral 04/07/2015   Procedure: ROBOTIC ASSISTED RADICAL HYSTERECTOMY BILATERAL SALPINGO OOPHORECTOMY BILATERAL SENTINEL LYMPHADENETOMY;  Surgeon: Everitt Amber, MD;  [facility];  Service: Gynecology;  Laterality: Bilateral;  . TUBAL LIGATION       Current Outpatient Medications  Medication Sig Dispense Refill  . apixaban (ELIQUIS) 5 MG TABS tablet Take 5 mg by mouth 2 (two) times daily.     . Calcium Carbonate (CALCIUM 600 PO) Take 1 tablet by mouth daily.    . Cholecalciferol (VITAMIN D PO) Take 1 capsule by mouth daily.    . diclofenac (VOLTAREN) 75 MG EC tablet Take 75 mg by mouth daily.    Marland Kitchen diltiazem (CARDIZEM LA) 300 MG 24 hr tablet Take 1 tablet (300 mg total) by mouth daily. 90 tablet 3  . fexofenadine (ALLEGRA) 180 MG tablet Take 180 mg by mouth daily.    Marland Kitchen gabapentin (NEURONTIN) 100 MG capsule Take 100 mg by mouth at bedtime.  1  . OVER THE COUNTER MEDICATION Take 1 tablet by mouth daily. Move Free Ultra Triple Action for Joint Health    . rOPINIRole (REQUIP) 1 MG tablet Take 1 mg by mouth at bedtime.  0  . UNABLE TO FIND In and out foley catheter 14 Fr. Q# 20   Refills: none Insert  catheter every 2 hours to empty bladder. 1 each 1  . Catheters MISC 1 Units by Does not apply route every 3 (three) hours as needed. 60 each 10   No current facility-administered medications for this visit.     Allergies:   Aspirin, Ciprofloxacin, Flagyl [metronidazole], and Penicillins    ROS:  Please see the history of present illness.   Otherwise, review of systems are positive for none.   All other systems are reviewed and negative.    PHYSICAL EXAM: VS:  BP 130/80   Pulse 88   Ht 5\' 3"  (1.6 m)   Wt 137 lb (62.1 kg)   BMI 24.27 kg/m  , BMI Body mass index is 24.27 kg/m. GENERAL:  Well appearing NECK:  No jugular venous distention, waveform within normal limits, carotid upstroke  brisk and symmetric, no bruits, no thyromegaly LUNGS:  Clear to auscultation bilaterally CHEST:  Unremarkable HEART:  PMI not displaced or sustained,S1 and S2 within normal limits, no S3, no clicks, no rubs, no murmurs, irregular ABD:  Flat, positive bowel sounds normal in frequency in pitch, no bruits, no rebound, no guarding, no midline pulsatile mass, no hepatomegaly, no splenomegaly EXT:  2 plus pulses throughout, no edema, no cyanosis no clubbing   EKG:  EKG is not ordered today.   Recent Labs: 04/23/2019: BUN 13; Creatinine, Ser 0.86; Hemoglobin 13.2; Platelets 294; Potassium 4.6; Sodium 139; TSH 0.598    Lipid Panel No results found for: CHOL, TRIG, HDL, CHOLHDL, VLDL, LDLCALC, LDLDIRECT    Wt Readings from Last 3 Encounters:  05/27/19 137 lb (62.1 kg)  04/15/19 136 lb (61.7 kg)  01/02/19 130 lb 8 oz (59.2 kg)      Other studies Reviewed: Additional studies/ records that were reviewed today include: Echo. Review of the above records demonstrates:  Please see elsewhere in the note.     ASSESSMENT AND PLAN  ATRIAL FIB:   Pam [name] has a CHA2DS2 - VASc score of 3.   Given results of recently released EAST AFNET study to be a little more aggressive with early onset fibrillation and I do think that cardioversion is indicated. It is much easier timing wise if we do this on Eliquis and get a try to get her scheduled soon and to get enough Eliquis. The duration of the cardioversion and 4 weeks after were before she switches to warfarin. I gave her rate control where it is. I discussed the risk benefits of cardioversion and she agrees to proceed.  PFO: I discussed this with her. This is an incidental finding. No change in therapy.   Current medicines are reviewed at length with the patient today.  The patient does not have concerns regarding medicines.  The following changes have been made:  None  Labs/ tests ordered today include:   No orders of the defined types  were placed in this encounter.    Disposition:   FU with me in about after the cardioversion.   Signed, Minus Breeding, MD  05/27/2019 12:08 PM    [place]

## 2019-05-27 ENCOUNTER — Encounter: Payer: Self-pay | Admitting: Cardiology

## 2019-05-27 ENCOUNTER — Other Ambulatory Visit: Payer: Self-pay

## 2019-05-27 ENCOUNTER — Ambulatory Visit (INDEPENDENT_AMBULATORY_CARE_PROVIDER_SITE_OTHER): Payer: Medicare Other | Admitting: Cardiology

## 2019-05-27 VITALS — BP 130/80 | HR 88 | Ht 63.0 in | Wt 137.0 lb

## 2019-05-27 DIAGNOSIS — E785 Hyperlipidemia, unspecified: Secondary | ICD-10-CM | POA: Diagnosis not present

## 2019-05-27 DIAGNOSIS — I480 Paroxysmal atrial fibrillation: Secondary | ICD-10-CM | POA: Diagnosis not present

## 2019-05-27 DIAGNOSIS — Z1389 Encounter for screening for other disorder: Secondary | ICD-10-CM | POA: Diagnosis not present

## 2019-05-27 DIAGNOSIS — M7050 Other bursitis of knee, unspecified knee: Secondary | ICD-10-CM | POA: Diagnosis not present

## 2019-05-27 DIAGNOSIS — I4819 Other persistent atrial fibrillation: Secondary | ICD-10-CM | POA: Diagnosis not present

## 2019-05-27 DIAGNOSIS — Z0000 Encounter for general adult medical examination without abnormal findings: Secondary | ICD-10-CM | POA: Diagnosis not present

## 2019-05-27 NOTE — Patient Instructions (Signed)
Medication Instructions:  The current medical regimen is effective;  continue present plan and medications.  *If you need a refill on your cardiac medications before your next appointment, please call your pharmacy*  Lab Work: Please have Covid screen completed at [facility] (Short Stay).  Follow the directions provided on the print out given.  If you have labs (blood work) drawn today and your tests are completely normal, you will receive your results only by: Marland Kitchen MyChart Message (if you have MyChart) OR . A paper copy in the mail If you have any lab test that is abnormal or we need to change your treatment, we will call you to review the results.  Testing/Procedures: Your physician has requested that you have a Cardioversion. Electrical Cardioversion uses a jolt of electricity to your heart either through paddles or wired patches attached to your chest. This is a controlled, usually prescheduled, procedure. This procedure is done at the hospital and you are not awake during the procedure. You usually go home the day of the procedure. Please see the instruction sheet given to you today for more information.  Follow-Up: Follow up after your cardioversion as directed.  Thank you for choosing [facility]!!

## 2019-05-28 ENCOUNTER — Telehealth: Payer: Self-pay | Admitting: Cardiology

## 2019-05-28 NOTE — Telephone Encounter (Signed)
New Message     Patient calling the office for samples of medication:   1.  What medication and dosage are you requesting samples for? Eliquis 5 mg   2.  Are you currently out of this medication? Pt is not out, she has a few left. But she is needing a few more boxes of samples to get her through, she says 2-3 more boxes.     Please call the pt to let her know about the samples  [phone]

## 2019-05-28 NOTE — Telephone Encounter (Signed)
Eliquis 5 mg #4 Lot [id number] exp 8/22 at front for pick, daughter aware

## 2019-05-29 ENCOUNTER — Other Ambulatory Visit: Payer: Self-pay

## 2019-05-29 ENCOUNTER — Other Ambulatory Visit (HOSPITAL_COMMUNITY)
Admission: RE | Admit: 2019-05-29 | Discharge: 2019-05-29 | Disposition: A | Discharge status: Home / Self Care | Payer: Medicare Other | Source: Ambulatory Visit | Attending: Cardiology | Admitting: Cardiology

## 2019-05-29 DIAGNOSIS — Z20828 Contact with and (suspected) exposure to other viral communicable diseases: Secondary | ICD-10-CM | POA: Diagnosis not present

## 2019-05-29 DIAGNOSIS — Z01812 Encounter for preprocedural laboratory examination: Secondary | ICD-10-CM | POA: Insufficient documentation

## 2019-05-29 LAB — SARS CORONAVIRUS 2 (TAT 6-24 HRS): SARS Coronavirus 2: NEGATIVE

## 2019-06-03 ENCOUNTER — Ambulatory Visit (HOSPITAL_COMMUNITY): Payer: Medicare Other | Admitting: Certified Registered Nurse Anesthetist

## 2019-06-03 ENCOUNTER — Encounter (HOSPITAL_COMMUNITY)
Admission: RE | Disposition: A | Discharge status: Home / Self Care | Payer: Self-pay | Source: Home / Self Care | Attending: Cardiology

## 2019-06-03 ENCOUNTER — Other Ambulatory Visit: Payer: Self-pay

## 2019-06-03 ENCOUNTER — Ambulatory Visit (HOSPITAL_COMMUNITY)
Admission: RE | Admit: 2019-06-03 | Discharge: 2019-06-03 | Disposition: A | Discharge status: Home / Self Care | Payer: Medicare Other | Attending: Cardiology | Admitting: Cardiology

## 2019-06-03 ENCOUNTER — Encounter (HOSPITAL_COMMUNITY): Payer: Self-pay | Admitting: *Deleted

## 2019-06-03 DIAGNOSIS — K469 Unspecified abdominal hernia without obstruction or gangrene: Secondary | ICD-10-CM | POA: Diagnosis not present

## 2019-06-03 DIAGNOSIS — I4891 Unspecified atrial fibrillation: Secondary | ICD-10-CM | POA: Diagnosis not present

## 2019-06-03 DIAGNOSIS — Q211 Atrial septal defect: Secondary | ICD-10-CM | POA: Diagnosis not present

## 2019-06-03 DIAGNOSIS — Z880 Allergy status to penicillin: Secondary | ICD-10-CM | POA: Diagnosis not present

## 2019-06-03 DIAGNOSIS — Z79899 Other long term (current) drug therapy: Secondary | ICD-10-CM | POA: Insufficient documentation

## 2019-06-03 DIAGNOSIS — I480 Paroxysmal atrial fibrillation: Secondary | ICD-10-CM | POA: Diagnosis not present

## 2019-06-03 DIAGNOSIS — Z7901 Long term (current) use of anticoagulants: Secondary | ICD-10-CM | POA: Diagnosis not present

## 2019-06-03 DIAGNOSIS — Z881 Allergy status to other antibiotic agents status: Secondary | ICD-10-CM | POA: Diagnosis not present

## 2019-06-03 DIAGNOSIS — C539 Malignant neoplasm of cervix uteri, unspecified: Secondary | ICD-10-CM | POA: Diagnosis not present

## 2019-06-03 DIAGNOSIS — I4819 Other persistent atrial fibrillation: Secondary | ICD-10-CM | POA: Diagnosis not present

## 2019-06-03 DIAGNOSIS — Z886 Allergy status to analgesic agent status: Secondary | ICD-10-CM | POA: Insufficient documentation

## 2019-06-03 DIAGNOSIS — E785 Hyperlipidemia, unspecified: Secondary | ICD-10-CM | POA: Insufficient documentation

## 2019-06-03 HISTORY — PX: CARDIOVERSION: SHX1299

## 2019-06-03 SURGERY — CARDIOVERSION
Anesthesia: General

## 2019-06-03 MED ORDER — PROPOFOL 10 MG/ML IV BOLUS
INTRAVENOUS | Status: DC | PRN
  Administered 2019-06-03: 70 mg via INTRAVENOUS

## 2019-06-03 MED ORDER — PROMETHAZINE HCL 25 MG/ML IJ SOLN: 6.2500 mg | INTRAMUSCULAR | Status: DC | PRN

## 2019-06-03 MED ORDER — LIDOCAINE 2% (20 MG/ML) 5 ML SYRINGE
INTRAMUSCULAR | Status: DC | PRN
  Administered 2019-06-03: 40 mg via INTRAVENOUS

## 2019-06-03 MED ORDER — SODIUM CHLORIDE 0.9 % IV SOLN
INTRAVENOUS | Status: AC | PRN
  Administered 2019-06-03: 500 mL via INTRAVENOUS

## 2019-06-03 NOTE — Anesthesia Preprocedure Evaluation (Addendum)
Anesthesia Evaluation  Patient identified by MRN, date of birth, ID band Patient awake    Reviewed: Allergy & Precautions, NPO status , Patient's Chart, lab work & pertinent test results  History of Anesthesia Complications Negative for: history of anesthetic complications  Airway Mallampati: II  TM Distance: >3 FB Neck ROM: Full    Dental  (+) Upper Dentures, Lower Dentures   Pulmonary former smoker,    Pulmonary exam normal        Cardiovascular Normal cardiovascular exam+ dysrhythmias (on Eliquis) Atrial Fibrillation   TTE 04/23/19: EF 55-60%, severe LAE, mildly elevated pulmonary artery systolic pressure, small PFO with predominantly left to right shunting across the atrial septum    Neuro/Psych negative neurological ROS  negative psych ROS   GI/Hepatic negative GI ROS, Neg liver ROS,   Endo/Other  negative endocrine ROS  Renal/GU negative Renal ROS  negative genitourinary   Musculoskeletal  (+) Arthritis ,   Abdominal   Peds  Hematology negative hematology ROS (+)   Anesthesia Other Findings Day of surgery medications reviewed with patient.  Reproductive/Obstetrics negative OB ROS                            Anesthesia Physical Anesthesia Plan  ASA: III  Anesthesia Plan: General   Post-op Pain Management:    Induction: Intravenous  PONV Risk Score and Plan: Treatment may vary due to age or medical condition and Propofol infusion  Airway Management Planned: Mask  Additional Equipment: None  Intra-op Plan:   Post-operative Plan:   Informed Consent: I have reviewed the patients History and Physical, chart, labs and discussed the procedure including the risks, benefits and alternatives for the proposed anesthesia with the patient or authorized representative who has indicated his/her understanding and acceptance.     Dental advisory given  Plan Discussed with:  CRNA  Anesthesia Plan Comments:        Anesthesia Quick Evaluation

## 2019-06-03 NOTE — CV Procedure (Signed)
    Electrical Cardioversion Procedure Note Pam [name] [id number] 1941-05-28  Procedure: Electrical Cardioversion Indications:  Atrial Fibrillation  Time Out: Verified patient identification, verified procedure,medications/allergies/relevent history reviewed, required imaging and test results available.  Performed  Procedure Details  The patient was NPO after midnight. Anesthesia was administered at the beside  by Dr. Wallie [name] with propofol.  Cardioversion was performed with synchronized biphasic defibrillation via AP pads with 120 joules.  1 attempt(s) were performed.  The patient converted to normal sinus rhythm. The patient tolerated the procedure well   IMPRESSION:  Successful cardioversion of atrial fibrillation. PACs and PAT noted post.     Pam [name] 06/03/2019, 9:59 AM

## 2019-06-03 NOTE — Anesthesia Postprocedure Evaluation (Signed)
Anesthesia Post Note  Patient: Pam [name]  Procedure(s) Performed: CARDIOVERSION (N/A )     Patient location during evaluation: PACU Anesthesia Type: General Level of consciousness: awake and alert and oriented Pain management: pain level controlled Vital Signs Assessment: post-procedure vital signs reviewed and stable Respiratory status: spontaneous breathing, nonlabored ventilation and respiratory function stable Cardiovascular status: blood pressure returned to baseline Postop Assessment: no apparent nausea or vomiting Anesthetic complications: no    Last Vitals:  Vitals:   06/03/19 1004 06/03/19 1010  BP: 101/68 101/66  Pulse: 71 67  Resp: 20 17  Temp:    SpO2: 98% 97%    Last Pain:  Vitals:   06/03/19 1010  TempSrc:   PainSc: 0-No pain                 Brennan Bailey

## 2019-06-03 NOTE — Interval H&P Note (Signed)
History and Physical Interval Note:  06/03/2019 9:32 AM  Pam [name]  has presented today for surgery, with the diagnosis of AFIB.  The various methods of treatment have been discussed with the patient and family. After consideration of risks, benefits and other options for treatment, the patient has consented to  Procedure(s): CARDIOVERSION (N/A) as a surgical intervention.  The patient's history has been reviewed, patient examined, no change in status, stable for surgery.  I have reviewed the patient's chart and labs.  Questions were answered to the patient's satisfaction.     [employer]

## 2019-06-03 NOTE — Transfer of Care (Signed)
Immediate Anesthesia Transfer of Care Note  Patient: Pam [name]  Procedure(s) Performed: CARDIOVERSION (N/A )  Patient Location: PACU and Endoscopy Unit  Anesthesia Type:General  Level of Consciousness: awake and patient cooperative  Airway & Oxygen Therapy: Patient Spontanous Breathing and Patient connected to face mask oxygen  Post-op Assessment: Report given to RN, Post -op Vital signs reviewed and stable and Patient moving all extremities  Post vital signs: Reviewed and stable  Last Vitals:  Vitals Value Taken Time  BP    Temp    Pulse    Resp    SpO2      Last Pain:  Vitals:   06/03/19 0839  TempSrc: Temporal  PainSc: 0-No pain         Complications: No apparent anesthesia complications

## 2019-06-03 NOTE — Discharge Instructions (Signed)

## 2019-06-04 LAB — POCT I-STAT, CHEM 8
BUN: 14 mg/dL (ref 8–23)
Calcium, Ion: 1.18 mmol/L (ref 1.15–1.40)
Chloride: 103 mmol/L (ref 98–111)
Creatinine, Ser: 0.7 mg/dL (ref 0.44–1.00)
Glucose, Bld: 101 mg/dL — ABNORMAL HIGH (ref 70–99)
HCT: 43 % (ref 36.0–46.0)
Hemoglobin: 14.6 g/dL (ref 12.0–15.0)
Potassium: 4.1 mmol/L (ref 3.5–5.1)
Sodium: 141 mmol/L (ref 135–145)
TCO2: 26 mmol/L (ref 22–32)

## 2019-06-05 ENCOUNTER — Telehealth: Payer: Self-pay | Admitting: Cardiology

## 2019-06-05 ENCOUNTER — Encounter (HOSPITAL_COMMUNITY): Payer: Self-pay | Admitting: Cardiology

## 2019-06-05 NOTE — Telephone Encounter (Signed)
New Message  Patient called wondering when she needs to schedule her follow-up appointment  Please call back

## 2019-06-05 NOTE — Telephone Encounter (Signed)
Pt calling to see when she needed to f/u with Dr. [name]. Informed pt that per last OV note, MD recommend f/u after cardioversion.  Appointment scheduled for 06/08/19 at 7:30 am.

## 2019-06-07 DIAGNOSIS — I4819 Other persistent atrial fibrillation: Secondary | ICD-10-CM | POA: Insufficient documentation

## 2019-06-07 NOTE — Progress Notes (Signed)
Cardiology Office Note   Date:  06/08/2019   ID:  Reylin, Arcand 1941/07/20, MRN [id number]  PCP:  Neale Burly, MD  Cardiologist:   No primary care provider on file. Referring:  Neale Burly, MD  Chief Complaint  Patient presents with  . Atrial Fibrillation      History of Present Illness: Pam [name] is a 78 y.o. female who is referred by Neale Burly, MD for evaluation of atrial fib.   After I last saw her I ordered an echo which was unremarkable. Since I last saw her she had cardioversion last week.  She stayed in sinus rhythm she thinks her about 2 days.  Then she had a heart rate that was 144 early in the morning Wednesday after the Monday cardioversion.  She does not really feel this.  She denies any palpitations, presyncope or syncope.  Having any chest pressure, neck or arm discomfort.  She does have any shortness of breath.  She thinks her real limitation is her left knee pain and now she is not able to take nonsteroidals because of the blood thinners.  She might have to have knee surgery.   Past Medical History:  Diagnosis Date  . Arthritis   . Cancer [facility])    BREAST CANCER / CERVICAL CANCER   . Cervical cancer ([place])   . History of breast cancer 2006   Dr. Sonny Dandy oncologist - left breast  . History of gout   . Hyperlipidemia   . Urinary retention Sept 2016   s/p hysterectomy    Past Surgical History:  Procedure Laterality Date  . CARDIOVERSION N/A 06/03/2019   Procedure: CARDIOVERSION;  Surgeon: Jerline Pain, MD;  Location: [facility] ENDOSCOPY;  Service: Cardiovascular;  Laterality: N/A;  . INGUINAL HERNIA REPAIR Right 12/14/2015   Procedure: LAPAROSCOPIC AND OPEN RIGHT  INGUINAL HERNIA;  Surgeon: Johnathan Hausen, MD;  Location: [facility] ORS;  Service: General;  Laterality: Right;  . KNEE ARTHROSCOPY Left   . MASTECTOMY Left 2006  . ROBOTIC ASSISTED TOTAL HYSTERECTOMY WITH BILATERAL SALPINGO OOPHERECTOMY Bilateral 04/07/2015   Procedure: ROBOTIC ASSISTED  RADICAL HYSTERECTOMY BILATERAL SALPINGO OOPHORECTOMY BILATERAL SENTINEL LYMPHADENETOMY;  Surgeon: Everitt Amber, MD;  Location: [facility] ORS;  Service: Gynecology;  Laterality: Bilateral;  . TUBAL LIGATION       Current Outpatient Medications  Medication Sig Dispense Refill  . apixaban (ELIQUIS) 5 MG TABS tablet Take 5 mg by mouth 2 (two) times daily.     . Calcium Carb-Cholecalciferol (CALCIUM 600 + D PO) Take 1 tablet by mouth 2 (two) times daily.    . Catheters MISC 1 Units by Does not apply route every 3 (three) hours as needed. 60 each 10  . diclofenac sodium (VOLTAREN) 1 % GEL Apply 1 application topically 4 (four) times daily as needed (pain).    Marland Kitchen diltiazem (CARDIZEM CD) 360 MG 24 hr capsule Take 1 capsule (360 mg total) by mouth daily. 90 capsule 3  . diltiazem (CARDIZEM LA) 300 MG 24 hr tablet Take 1 tablet (300 mg total) by mouth daily. 90 tablet 3  . fexofenadine (ALLEGRA) 180 MG tablet Take 180 mg by mouth daily.    Marland Kitchen gabapentin (NEURONTIN) 100 MG capsule Take 100 mg by mouth at bedtime.  1  . magnesium hydroxide (DULCOLAX) 400 MG/5ML suspension Take 15 mLs by mouth daily as needed for mild constipation.    Marland Kitchen rOPINIRole (REQUIP) 1 MG tablet Take 1 mg by mouth at bedtime.  0  . UNABLE TO FIND In and out foley catheter 14 Fr. Q# 20   Refills: none Insert catheter every 2 hours to empty bladder. 1 each 1   No current facility-administered medications for this visit.     Allergies:   Aspirin, Ciprofloxacin, Flagyl [metronidazole], and Penicillins    ROS:  Please see the history of present illness.   Otherwise, review of systems are positive for none.   All other systems are reviewed and negative.    PHYSICAL EXAM: VS:  BP (!) 148/80   Pulse 95   Ht 5\' 3"  (1.6 m)   Wt 135 lb 9.6 oz (61.5 kg)   SpO2 95%   BMI 24.02 kg/m  , BMI Body mass index is 24.02 kg/m. GENERAL:  Well appearing NECK:  No jugular venous distention, waveform within normal limits, carotid upstroke brisk and  symmetric, no bruits, no thyromegaly LUNGS:  Clear to auscultation bilaterally CHEST:  Unremarkable HEART:  PMI not displaced or sustained,S1 and S2 within normal limits, no S3, no clicks, no rubs, no murmurs, irregular ABD:  Flat, positive bowel sounds normal in frequency in pitch, no bruits, no rebound, no guarding, no midline pulsatile mass, no hepatomegaly, no splenomegaly EXT:  2 plus pulses throug2hout, no edema, no cyanosis no clubbing   EKG:  EKG is  ordered today. Atrial fibrillation rate 119, axis, intervals within normal limits, no acute ST-T wave changes.  Recent Labs: 04/23/2019: Platelets 294; TSH 0.598 06/03/2019: BUN 14; Creatinine, Ser 0.70; Hemoglobin 14.6; Potassium 4.1; Sodium 141    Lipid Panel No results found for: CHOL, TRIG, HDL, CHOLHDL, VLDL, LDLCALC, LDLDIRECT    Wt Readings from Last 3 Encounters:  06/08/19 135 lb 9.6 oz (61.5 kg)  06/03/19 137 lb (62.1 kg)  05/27/19 137 lb (62.1 kg)      Other studies Reviewed: Additional studies/ records that were reviewed today include: EKG Review of the above records demonstrates:  Please see elsewhere in the note.     ASSESSMENT AND PLAN  ATRIAL FIB:   Pam [name] has a CHA2DS2 - VASc score of 3.  The patient tolerates this rhythm.  I do not think there is further reason to try rhythm management but rather I will pursue rate control and anticoagulation.  She cannot afford Eliquis when she finishes the 4-week course post cardioversion to go to her primary care and can be switched to warfarin.  She is going to get a Ecologist for judging her rate control.  It does not seem to be well controlled at this point so I am going to increase the Cardizem to 360 mg.  She will let me know what her heart rate is doing and might have to add a beta-blocker.  PFO: Incidental finding.  We discussed this at the last visit.  No change in therapy.     Current medicines are reviewed at length with the patient today.  The  patient does not have concerns regarding medicines.  The following changes have been made:  As above.   Labs/ tests ordered today include: None  Orders Placed This Encounter  Procedures  . EKG 12-Lead     Disposition:   FU with me 1 month in [place].   Signed, Minus Breeding, MD  06/08/2019 8:19 AM    [facility]

## 2019-06-08 ENCOUNTER — Encounter: Payer: Self-pay | Admitting: Cardiology

## 2019-06-08 ENCOUNTER — Ambulatory Visit (INDEPENDENT_AMBULATORY_CARE_PROVIDER_SITE_OTHER): Payer: Medicare Other | Admitting: Cardiology

## 2019-06-08 ENCOUNTER — Other Ambulatory Visit: Payer: Self-pay

## 2019-06-08 VITALS — BP 148/80 | HR 95 | Ht 63.0 in | Wt 135.6 lb

## 2019-06-08 DIAGNOSIS — I4819 Other persistent atrial fibrillation: Secondary | ICD-10-CM

## 2019-06-08 MED ORDER — DILTIAZEM HCL ER COATED BEADS 360 MG PO CP24: 360.0000 mg | ORAL_CAPSULE | Freq: Every day | ORAL | 3 refills | Status: DC

## 2019-06-08 NOTE — Patient Instructions (Signed)
Medication Instructions:  Start taking 360mg  Cardizem Daily.  If you need a refill on your cardiac medications before your next appointment, please call your pharmacy.   Lab work: NONE  Testing/Procedures: NONE  Follow-Up: At [facility], you and your health needs are our priority.  As part of our continuing mission to provide you with exceptional heart care, we have created designated Provider Care Teams.  These Care Teams include your primary Cardiologist (physician) and Advanced Practice Providers (APPs -  Physician Assistants and Nurse Practitioners) who all work together to provide you with the care you need, when you need it. You may see Dr [name] or one of the following Advanced Practice Providers on your designated Care Team:    Rosaria Ferries, PA-C  Jory Sims, DNP, ANP  Cadence Kathlen Mody, NP   Your physician wants you to follow-up in: 1 month at the [facility].

## 2019-06-16 ENCOUNTER — Encounter: Payer: Self-pay | Admitting: Orthopaedic Surgery

## 2019-06-16 NOTE — Telephone Encounter (Signed)
Returned call to patient left message to call back. 

## 2019-06-25 ENCOUNTER — Ambulatory Visit (INDEPENDENT_AMBULATORY_CARE_PROVIDER_SITE_OTHER): Payer: Medicare Other | Admitting: Orthopaedic Surgery

## 2019-06-25 ENCOUNTER — Other Ambulatory Visit: Payer: Self-pay

## 2019-06-25 ENCOUNTER — Ambulatory Visit (INDEPENDENT_AMBULATORY_CARE_PROVIDER_SITE_OTHER): Payer: Medicare Other

## 2019-06-25 ENCOUNTER — Encounter: Payer: Self-pay | Admitting: Orthopaedic Surgery

## 2019-06-25 VITALS — BP 114/67 | HR 127 | Ht 63.0 in | Wt 138.0 lb

## 2019-06-25 DIAGNOSIS — G8929 Other chronic pain: Secondary | ICD-10-CM

## 2019-06-25 DIAGNOSIS — M25562 Pain in left knee: Secondary | ICD-10-CM | POA: Diagnosis not present

## 2019-06-25 MED ORDER — ACETAMINOPHEN-CODEINE #3 300-30 MG PO TABS: 1.0000 | ORAL_TABLET | Freq: Four times a day (QID) | ORAL | 0 refills | Status: DC | PRN

## 2019-06-26 ENCOUNTER — Other Ambulatory Visit: Payer: Self-pay

## 2019-06-26 ENCOUNTER — Telehealth: Payer: Self-pay | Admitting: *Deleted

## 2019-06-26 ENCOUNTER — Telehealth: Payer: Self-pay | Admitting: Cardiology

## 2019-06-26 NOTE — Telephone Encounter (Signed)
Spoke with pt dtr, aware the clearance letter has been received and will be placed in the system for review.

## 2019-06-26 NOTE — Telephone Encounter (Signed)
Dr. [name]  I see that you saw this patient on 06/08/2019 in follow-up for atrial fibrillation status post DCCV.  Unfortunately, it appears that her cardioversion was unsuccessful and she was back in atrial fibrillation at the time for follow-up.  Plan at that time was to continue with rate control and anticoagulation.  We received a preoperative clearance request from Dr. [name] for a left total knee arthroplasty scheduled for 07/13/2019.  They are asking for Eliquis holding recommendations which are pharmacy has addressed however I wanted to confirm with you prior to sending clearance forms.  I am unclear if there is any contraindication for holding anticoagulation in the setting of an unsuccessful DCCV.  Please send your comments to the preoperative pool  Thank you Sharee Pimple

## 2019-06-26 NOTE — Telephone Encounter (Signed)
   [facility] Pre-operative Risk Assessment    Request for surgical clearance:  1. What type of surgery is being performed? Left total knee arthoplasty   2. When is this surgery scheduled? 07-13-2019   3. What type of clearance is required (medical clearance vs. Pharmacy clearance to hold med vs. Both)? both  4. Are there any medications that need to be held prior to surgery and how long?eliquis need direction   5. Practice name and name of physician performing surgery? Mark yates md   6. What is your office phone number [phone]    7.   What is your office fax number [phone]  8.   Anesthesia type (None, local, MAC, general) ? spinal   Pam [name] 06/26/2019, 3:37 PM  _________________________________________________________________   (provider comments below)

## 2019-06-26 NOTE — Telephone Encounter (Signed)
Pt takes Eliquis for afib with CHADS2VASc score of 3 (age x2, sex). Renal function is normal. Recommend holding Eliquis for 3 days prior to TKA.

## 2019-06-26 NOTE — Telephone Encounter (Signed)
ERROR

## 2019-06-26 NOTE — Telephone Encounter (Signed)
Pt's daughter miss Pam [name] called following up on the surgical clearance that was faxed int by Dr [name] office? Adv pt of protocol.  Daughter would like a call back please stated has gotten to the point of not walking.

## 2019-06-27 NOTE — Telephone Encounter (Signed)
Looks like it was successful for awhile.  However, the risk of throwing the clot is always related to the time it takes for the "atrial to wake up."  Since she is back in atrial fib she remains at risk as calculated by CHA2DS2 - VASc score. She has no higher risk than anyone else in that situation and can hold the anticoagulation.

## 2019-06-28 NOTE — Progress Notes (Signed)
Office Visit Note   Patient: Pam [name]           Date of Birth: Jul 07, 1941           MRN: [id number] Visit Date: 06/25/2019              Requested by: Neale Burly, MD [place],  [place] [id number] PCP: Neale Burly, MD   Assessment & Plan: Visit Diagnoses:  1. Chronic pain of left knee     Plan: We renewed her Tylenol 3 due to her significant pain.  She needs a left  total knee arthroplasty which we discussed.  She is used a walker since she has had significant trouble walking.  When she went on Eliquis and stop the Voltaren her pain is gotten worse.  No joint space left laterally with marginal osteophyte subchondral sclerosis and tricompartmental degenerative changes.  We discussed total knee arthroplasty.  She need medical clearance with Dr. [name]  and decision af made about either bridging her with Lovenox subcutaneous  or stopping the Eliquis the day before surgery, skipping the day she had surgery and then restarting it the day afterwards.  Risks of surgery discussed.  We discussed operative technique, spinal anesthesia, abductor block, Exparel and Marcaine to help with postoperative pain and speedy recovery.  Home physical therapy for 2 weeks and then a few weeks of outpatient therapy.  Questions were elicited and answered.  Follow-Up Instructions: No follow-ups on file.   Orders:  Orders Placed This Encounter  Procedures  . XR Knee 1-2 Views Left   Meds ordered this encounter  Medications  . acetaminophen-codeine (TYLENOL #3) 300-30 MG tablet    Sig: Take 1 tablet by mouth every 6 (six) hours as needed for moderate pain.    Dispense:  30 tablet    Refill:  0      Procedures: No procedures performed   Clinical Data: No additional findings.   Subjective: Chief Complaint  Patient presents with  . Left Knee - Pain    HPI 78 year old female seen with ongoing left knee pain.  She states her problems began back 10 years ago she has had  knee arthroscopy with debridement by Dr. Maxwell Marion.  Patient's had cortisone injections as well as 2 gel injections that helped with some relief.  She has been on long-term diclofenac she was taken off a month ago due to atrial fibrillation and Eliquis treatment.  Since that time she has had difficulty ambulating in the community problems getting up and down she has had to use a walker for 2 weeks has increased swelling in her knee and states she can barely walk.  She has had some previous bladder problems after bladder tack up procedure states she has in and out cath herself.  She had previous cortisone injection at [facility] urgent care.  Patient is here with her daughter who brought her.  Review of Systems 14 point system 5 for breast cancer 2006 with no active disease at present.  Cervical cancer 2016 hernia surgery 2018.  Atrial for with cardioversion November 2020.  Cataract surgery August.  Positive for acid reflux bladder problems described above cataracts osteoporosis.   Objective: Vital Signs: BP 114/67   Pulse (!) 127   Ht 5\' 3"  (1.6 m)   Wt 138 lb (62.6 kg)   BMI 24.45 kg/m   Physical Exam Constitutional:      Appearance: She is well-developed.  HENT:  Head: Normocephalic.     Right Ear: External ear normal.     Left Ear: External ear normal.  Eyes:     Pupils: Pupils are equal, round, and reactive to light.  Neck:     Thyroid: No thyromegaly.     Trachea: No tracheal deviation.  Cardiovascular:     Comments: Irregularly irregular consistent with atrial fibrillation.  Rate is 85 to 110 Pulmonary:     Effort: Pulmonary effort is normal.  Abdominal:     Palpations: Abdomen is soft.  Skin:    General: Skin is warm and dry.  Neurological:     Mental Status: She is alert and oriented to person, place, and time.  Psychiatric:        Behavior: Behavior normal.     Ortho Exam patient has severe crepitus with right and left  knee range of  motion and bilateral genu valgum both knees.  Lateral joint line tenderness worse on the left knee than right knee.   Severe patellofemoral crepitus 3+ left knee effusion.  Negative logroll of the hips negative straight leg raising 90 degrees.  Distal pulses are palpable.  Specialty Comments:  No specialty comments available.  Imaging: No results found.   PMFS History: Patient Active Problem List   Diagnosis Date Noted  . Persistent atrial fibrillation ([place]) 06/07/2019  . PAF (paroxysmal atrial fibrillation) ([place])   . Candidiasis, vagina 01/02/2019  . Hernia of abdominal cavity 10/07/2015  . Overflow incontinence of urine 07/04/2015  . Urinary retention with incomplete bladder emptying 05/09/2015  . Cervical cancer ([place]) 03/23/2015   Past Medical History:  Diagnosis Date  . Arthritis   . Cancer [facility])    BREAST CANCER / CERVICAL CANCER   . Cervical cancer ([place])   . History of breast cancer 2006   Dr. Sonny Dandy oncologist - left breast  . History of gout   . Hyperlipidemia   . Urinary retention Sept 2016   s/p hysterectomy    Family History  Problem Relation Age of Onset  . Prostate cancer Father   . Heart failure Father        91s  . Heart failure Mother        14  . Breast cancer Sister     Past Surgical History:  Procedure Laterality Date  . CARDIOVERSION N/A 06/03/2019   Procedure: CARDIOVERSION;  Surgeon: Jerline Pain, MD;  Location: [facility] ENDOSCOPY;  Service: Cardiovascular;  Laterality: N/A;  . INGUINAL HERNIA REPAIR Right 12/14/2015   Procedure: LAPAROSCOPIC AND OPEN RIGHT  INGUINAL HERNIA;  Surgeon: Johnathan Hausen, MD;  Location: [facility] ORS;  Service: General;  Laterality: Right;  . KNEE ARTHROSCOPY Left   . MASTECTOMY Left 2006  . ROBOTIC ASSISTED TOTAL HYSTERECTOMY WITH BILATERAL SALPINGO OOPHERECTOMY Bilateral 04/07/2015   Procedure: ROBOTIC ASSISTED RADICAL HYSTERECTOMY BILATERAL SALPINGO OOPHORECTOMY BILATERAL SENTINEL LYMPHADENETOMY;  Surgeon: Everitt Amber, MD;   Location: [facility] ORS;  Service: Gynecology;  Laterality: Bilateral;  . TUBAL LIGATION     Social History   Occupational History  . 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 Smoking status: Former Smoker    Packs/day: 1.00    Years: 30.00    Pack years: 30.00    Types: Cigarettes    Quit date: 07/23/1994    Years since quitting: 24.9  . Smokeless tobacco: Never Used  Substance and Sexual Activity  . Alcohol use: Never    Alcohol/week: 0.0 standard drinks    Frequency: Never  .  Drug use: Never  . Sexual activity: Not Currently

## 2019-06-29 DIAGNOSIS — M7050 Other bursitis of knee, unspecified knee: Secondary | ICD-10-CM | POA: Diagnosis not present

## 2019-06-30 NOTE — Telephone Encounter (Signed)
   Primary Cardiologist: Dr [name]  Chart reviewed as part of pre-operative protocol coverage. Given past medical history and time since last visit, based on ACC/AHA guidelines, Pam [name] would be at acceptable risk for the planned procedure without further cardiovascular testing.   OK to hold Eliquis 3 days pre op if needed.  I will route this recommendation to the requesting party via Epic fax function and remove from pre-op pool.  Please call with questions.  Kerin Ransom, PA-C 06/30/2019, 8:57 AM

## 2019-07-01 NOTE — Progress Notes (Signed)
Pam [name], Pam [name] [id number] [place] [id number] Phone: [phone] Fax: [phone]  [place] [address], [place] [address] Phone: [phone] Fax: [phone]      Your procedure is scheduled on December 21  Report to [facility] Main Entrance "A" at 1030 A.M., and check in at the Admitting office.  Call this number if you have problems the morning of surgery:  [phone]  Call [phone] if you have any questions prior to your surgery date Monday-Friday 8am-4pm    Remember:  Do not eat after midnight the night before your surgery  You may drink clear liquids until 0930 am the morning of your surgery.   Clear liquids allowed are: Water, Non-Citrus Juices (without pulp), Carbonated Beverages, Clear Tea, Black Coffee Only, and Gatorade   Enhanced Recovery after Surgery for Orthopedics Enhanced Recovery after Surgery is a protocol used to improve the stress on your body and your recovery after surgery.  Patient Instructions  . The night before surgery:  o No food after midnight. ONLY clear liquids after midnight  .  Marland Kitchen The day of surgery (if you do NOT have diabetes):  o Drink ONE (1) Pre-Surgery Clear Ensure before 0930 am the morning of your surgery o This drink was given to you during your hospital  pre-op appointment visit. o Finish the drink at the designated time by the pre-op nurse.  o Nothing else to drink after completing the  Pre-Surgery Clear Ensure.         If you have questions, please contact your surgeon's office.     Take these medicines the morning of surgery with A SIP OF WATER  diltiazem (CARDIZEM CD) fexofenadine (ALLEGRA) traMADol (ULTRAM)  If needed valACYclovir (VALTREX)  If needed   Take your last dose of  apixaban Arne Cleveland)  On 07/09/19  Follow your surgeon's instructions on when to stop Aspirin.  If no instructions were given by your  surgeon then you will need to call the office to get those instructions.    7 days prior to surgery STOP taking any  Aleve, Naproxen, Ibuprofen, Motrin, Advil, Goody's, BC's, all herbal medications, fish oil, and all vitamins.    The Morning of Surgery  Do not wear jewelry, make-up or nail polish.  Do not wear lotions, powders, or perfumes/colognes, or deodorant  Do not shave 48 hours prior to surgery.    Do not bring valuables to the hospital.  [facility] is not responsible for any belongings or valuables.  If you are a smoker, DO NOT Smoke 24 hours prior to surgery  If you wear a CPAP at night please bring your mask, tubing, and machine the morning of surgery   Remember that you must have someone to transport you home after your surgery, and remain with you for 24 hours if you are discharged the same day.   Please bring cases for contacts, glasses, hearing aids, dentures or bridgework because it cannot be worn into surgery.    Leave your suitcase in the car.  After surgery it may be brought to your room.  For patients admitted to the hospital, discharge time will be determined by your treatment team.  Patients discharged the day of surgery will not be allowed to drive home.    Special instructions:   [facility]- Preparing For Surgery  Before surgery, you can play an important role.  Because skin is not sterile, your skin needs to be as free of germs as possible. You can reduce the number of germs on your skin by washing with CHG (chlorahexidine gluconate) Soap before surgery.  CHG is an antiseptic cleaner which kills germs and bonds with the skin to continue killing germs even after washing.    Oral Hygiene is also important to reduce your risk of infection.  Remember - BRUSH YOUR TEETH THE MORNING OF SURGERY WITH YOUR REGULAR TOOTHPASTE  Please do not use if you have an allergy to CHG or antibacterial soaps. If your skin becomes reddened/irritated stop using the CHG.  Do not  shave (including legs and underarms) for at least 48 hours prior to first CHG shower. It is OK to shave your face.  Please follow these instructions carefully.   1. Shower the NIGHT BEFORE SURGERY and the MORNING OF SURGERY with CHG Soap.   2. If you chose to wash your hair, wash your hair first as usual with your normal shampoo.  3. After you shampoo, rinse your hair and body thoroughly to remove the shampoo.  4. Use CHG as you would any other liquid soap. You can apply CHG directly to the skin and wash gently with a scrungie or a clean washcloth.   5. Apply the CHG Soap to your body ONLY FROM THE NECK DOWN.  Do not use on open wounds or open sores. Avoid contact with your eyes, ears, mouth and genitals (private parts). Wash Face and genitals (private parts)  with your normal soap.   6. Wash thoroughly, paying special attention to the area where your surgery will be performed.  7. Thoroughly rinse your body with warm water from the neck down.  8. DO NOT shower/wash with your normal soap after using and rinsing off the CHG Soap.  9. Pat yourself dry with a CLEAN TOWEL.  10. Wear CLEAN PAJAMAS to bed the night before surgery, wear comfortable clothes the morning of surgery  11. Place CLEAN SHEETS on your bed the night of your first shower and DO NOT SLEEP WITH PETS.    Day of Surgery:  Please shower the morning of surgery with the CHG soap Do not apply any deodorants/lotions. Please wear clean clothes to the hospital/surgery center.   Remember to brush your teeth WITH YOUR REGULAR TOOTHPASTE.   Please read over the following fact sheets that you were given.

## 2019-07-02 ENCOUNTER — Telehealth: Payer: Self-pay | Admitting: Cardiology

## 2019-07-02 ENCOUNTER — Encounter: Payer: Self-pay | Admitting: Surgery

## 2019-07-02 ENCOUNTER — Ambulatory Visit (INDEPENDENT_AMBULATORY_CARE_PROVIDER_SITE_OTHER): Payer: Medicare Other | Admitting: Surgery

## 2019-07-02 ENCOUNTER — Telehealth: Payer: Self-pay | Admitting: Orthopaedic Surgery

## 2019-07-02 ENCOUNTER — Other Ambulatory Visit: Payer: Self-pay

## 2019-07-02 ENCOUNTER — Telehealth: Payer: Self-pay

## 2019-07-02 ENCOUNTER — Other Ambulatory Visit: Payer: Self-pay | Admitting: Orthopaedic Surgery

## 2019-07-02 ENCOUNTER — Other Ambulatory Visit (HOSPITAL_COMMUNITY)
Admission: RE | Admit: 2019-07-02 | Discharge: 2019-07-02 | Disposition: A | Discharge status: Home / Self Care | Payer: Medicare Other | Source: Ambulatory Visit | Attending: Gynecologic Oncology | Admitting: Gynecologic Oncology

## 2019-07-02 ENCOUNTER — Encounter: Payer: Self-pay | Admitting: Gynecologic Oncology

## 2019-07-02 ENCOUNTER — Other Ambulatory Visit: Payer: Self-pay | Admitting: Radiology

## 2019-07-02 ENCOUNTER — Inpatient Hospital Stay: Payer: Medicare Other | Attending: Gynecologic Oncology | Admitting: Gynecologic Oncology

## 2019-07-02 ENCOUNTER — Encounter (HOSPITAL_COMMUNITY)
Admission: RE | Admit: 2019-07-02 | Discharge: 2019-07-02 | Disposition: A | Discharge status: Home / Self Care | Payer: Medicare Other | Source: Ambulatory Visit | Attending: Orthopaedic Surgery | Admitting: Orthopaedic Surgery

## 2019-07-02 ENCOUNTER — Encounter (HOSPITAL_COMMUNITY): Payer: Self-pay

## 2019-07-02 ENCOUNTER — Encounter (HOSPITAL_COMMUNITY)
Admission: RE | Admit: 2019-07-02 | Discharge: 2019-07-02 | Disposition: A | Discharge status: Home / Self Care | Payer: Medicare Other | Source: Ambulatory Visit | Attending: Surgery | Admitting: Surgery

## 2019-07-02 VITALS — BP 126/94 | HR 106 | Temp 98.3°F | Resp 16 | Ht 63.0 in | Wt 134.5 lb

## 2019-07-02 VITALS — BP 137/67 | HR 88 | Temp 97.9°F

## 2019-07-02 DIAGNOSIS — Z90710 Acquired absence of both cervix and uterus: Secondary | ICD-10-CM | POA: Diagnosis not present

## 2019-07-02 DIAGNOSIS — R32 Unspecified urinary incontinence: Secondary | ICD-10-CM | POA: Diagnosis not present

## 2019-07-02 DIAGNOSIS — R600 Localized edema: Secondary | ICD-10-CM | POA: Insufficient documentation

## 2019-07-02 DIAGNOSIS — Z7982 Long term (current) use of aspirin: Secondary | ICD-10-CM | POA: Diagnosis not present

## 2019-07-02 DIAGNOSIS — Z8541 Personal history of malignant neoplasm of cervix uteri: Secondary | ICD-10-CM | POA: Insufficient documentation

## 2019-07-02 DIAGNOSIS — Z90722 Acquired absence of ovaries, bilateral: Secondary | ICD-10-CM | POA: Insufficient documentation

## 2019-07-02 DIAGNOSIS — R339 Retention of urine, unspecified: Secondary | ICD-10-CM | POA: Diagnosis not present

## 2019-07-02 DIAGNOSIS — Z7901 Long term (current) use of anticoagulants: Secondary | ICD-10-CM | POA: Insufficient documentation

## 2019-07-02 DIAGNOSIS — Z780 Asymptomatic menopausal state: Secondary | ICD-10-CM | POA: Insufficient documentation

## 2019-07-02 DIAGNOSIS — Z124 Encounter for screening for malignant neoplasm of cervix: Secondary | ICD-10-CM | POA: Insufficient documentation

## 2019-07-02 DIAGNOSIS — I4891 Unspecified atrial fibrillation: Secondary | ICD-10-CM | POA: Insufficient documentation

## 2019-07-02 DIAGNOSIS — Z01818 Encounter for other preprocedural examination: Secondary | ICD-10-CM | POA: Diagnosis not present

## 2019-07-02 DIAGNOSIS — C539 Malignant neoplasm of cervix uteri, unspecified: Secondary | ICD-10-CM | POA: Diagnosis not present

## 2019-07-02 DIAGNOSIS — Z79899 Other long term (current) drug therapy: Secondary | ICD-10-CM | POA: Diagnosis not present

## 2019-07-02 DIAGNOSIS — Z1151 Encounter for screening for human papillomavirus (HPV): Secondary | ICD-10-CM | POA: Insufficient documentation

## 2019-07-02 DIAGNOSIS — M1712 Unilateral primary osteoarthritis, left knee: Secondary | ICD-10-CM

## 2019-07-02 HISTORY — DX: Cardiac arrhythmia, unspecified: I49.9

## 2019-07-02 HISTORY — DX: Unspecified atrial fibrillation: I48.91

## 2019-07-02 LAB — COMPREHENSIVE METABOLIC PANEL
ALT: 24 U/L (ref 0–44)
AST: 16 U/L (ref 15–41)
Albumin: 3.5 g/dL (ref 3.5–5.0)
Alkaline Phosphatase: 112 U/L (ref 38–126)
Anion gap: 11 (ref 5–15)
BUN: 9 mg/dL (ref 8–23)
CO2: 26 mmol/L (ref 22–32)
Calcium: 9 mg/dL (ref 8.9–10.3)
Chloride: 102 mmol/L (ref 98–111)
Creatinine, Ser: 0.79 mg/dL (ref 0.44–1.00)
GFR calc Af Amer: >60 mL/min (ref >60)
GFR calc non Af Amer: >60 mL/min (ref >60)
Glucose, Bld: 96 mg/dL (ref 70–99)
Potassium: 4.2 mmol/L (ref 3.5–5.1)
Sodium: 139 mmol/L (ref 135–145)
Total Bilirubin: 0.1 mg/dL — ABNORMAL LOW (ref 0.3–1.2)
Total Protein: 5.9 g/dL — ABNORMAL LOW (ref 6.5–8.1)

## 2019-07-02 LAB — URINALYSIS, ROUTINE W REFLEX MICROSCOPIC
Bilirubin Urine: NEGATIVE
Glucose, UA: NEGATIVE mg/dL
Hgb urine dipstick: NEGATIVE
Ketones, ur: NEGATIVE mg/dL
Nitrite: POSITIVE — AB
Protein, ur: NEGATIVE mg/dL
Specific Gravity, Urine: 1.013 (ref 1.005–1.030)
pH: 6 (ref 5.0–8.0)

## 2019-07-02 LAB — CBC
HCT: 40.2 % (ref 36.0–46.0)
Hemoglobin: 12.9 g/dL (ref 12.0–15.0)
MCH: 29.3 pg (ref 26.0–34.0)
MCHC: 32.1 g/dL (ref 30.0–36.0)
MCV: 91.2 fL (ref 80.0–100.0)
Platelets: 648 10*3/uL — ABNORMAL HIGH (ref 150–400)
RBC: 4.41 MIL/uL (ref 3.87–5.11)
RDW: 13.1 % (ref 11.5–15.5)
WBC: 9.7 10*3/uL (ref 4.0–10.5)
nRBC: 0 % (ref 0.0–0.2)

## 2019-07-02 LAB — SURGICAL PCR SCREEN
MRSA, PCR: NEGATIVE
Staphylococcus aureus: NEGATIVE

## 2019-07-02 MED ORDER — SULFAMETHOXAZOLE-TRIMETHOPRIM 800-160 MG PO TABS: 1.0000 | ORAL_TABLET | Freq: Two times a day (BID) | ORAL | 0 refills | Status: DC

## 2019-07-02 MED ORDER — METOPROLOL TARTRATE 25 MG PO TABS: 25.0000 mg | ORAL_TABLET | Freq: Two times a day (BID) | ORAL | 3 refills | Status: DC

## 2019-07-02 NOTE — Progress Notes (Signed)
Abnormal labs have resulted, Dr. [name] notified via [place].

## 2019-07-02 NOTE — Telephone Encounter (Signed)
New Message    Pts daughter is calling about pts AFIB and is wondering if she needs to make her an appt or what she needs to do  Pts daughter says she is taking baby aspirin  She says pts PCP says she needed to follow up with her Heart doctor due to Afib    Please call

## 2019-07-02 NOTE — Telephone Encounter (Signed)
Spoke with pt daughter. Pt daughter aware of the following: "I would like for her to start metoprolol 25 mg PO bid.  Continue to hold he blood thinner until her PCP can sort out the bleeding in her BMs.  She likely could be seen in the [facility] soon or me virtually on Monday. "  She requests virtual appt Monday 12/14  At 10:40 am and will make pt aware. She will pick up pt metoprolol at [facility] in [place], [place]

## 2019-07-02 NOTE — Progress Notes (Signed)
CERVICAL CANCER FOLLOWUP Assessment:    78 y.o. 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   S/p robotic radical hysterectomy, BSO and bilateral SLN biopsy on 04/07/15. No evidence of recurrence.  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 Continue care with Dr Matilde Sprang appreciate his assistance.  3) Surveillance: Return to clinic 6  months to evaluate for surveillance of cancer. Repeat pap in October 2021.  4) New lower extremity edema: CT abd/pelvis to rule out pelvic recurrence causing edema.   HPI:  Pam [name] is a 78 y.o. year old initially seen in consultation on 04/25/15 for cervical cancer, referred by Dr Benjie Karvonen.  She then underwent a robotic radical hysterectomy, BSO, bilateral pelvic sentinel lymph node biopsy on [id number] without complications. Preoperative PET scan on 03/30/15 was negative for metastatic disease Her postoperative course was complicated by the development of urinary retention and overflow incontinence. She required prolonged catheterization and then intermittent self catheterization teaching.  Her final pathology revealed a 4.5cm moderately differentiated SCC of the cervix which had no LVSI, but had deep cervical stromal invasion (11 of 48mm). She had negative margins, negative nodes and negative parametrium. Her closest margin was 36mm.  She was recommended adjuvant pelvic RT to decrease risk of recurrence, but declined this after counseling with Dr Sondra Come due to patient concern regarding toxicity.   She developed postoperative urinary retention likely secondary to the radical parametrial dissection and interruption of hypogastric  nerves from her radical procedure. She was encouraged to self cath every 2hours to keep herself dry (she would have incontinence if she left it longer).  The patient was evaluated by Dr Matilde Sprang and has been continuing to self cath every 2 - 3 hours. She spontaneously voids only a little (50cc). She has some acceptance with this (less frustration).  In May, 2017 Dr Hassell Done from [facility] repaired her right indirect hernia. This was an uncomplicated surgery.   Interval Hx.  She denies vaginal bleeding. Her urinary retention is stable but bothersome. She continues to see Dr McDiarmid for this. She failed to have improvement with a trial of a sacral nerve stimultator that was placed.  She reports symptoms of bladder prolapse.  Pap was normal September, 2017 and October, 2018. Pap showed ASCUS in October, 2019 but was negative for high risk HPV.   The patient has developed atrial fibrillation, left knee issues and a new left lower extremity edema. She developed a spot of rectal bleeding and was taken off her eloquis. She is being worked up with colonoscopy for the rectal bleeding.   Current Outpatient Medications on File Prior to Visit  Medication Sig Dispense Refill  . acetaminophen-codeine (TYLENOL #3) 300-30 MG tablet Take 1 tablet by mouth every 6 (six) hours as needed for moderate pain. 30 tablet 0  . apixaban (ELIQUIS) 5 MG TABS tablet Take 5 mg by mouth 2 (two) times daily.     Marland Kitchen aspirin EC 81 MG tablet Take 81 mg by mouth daily.    . Calcium Carb-Cholecalciferol (CALCIUM 600 + D PO) Take 1 tablet by mouth daily.     . Catheters MISC 1 Units by Does not apply route every 3 (three) hours as needed. 60 each 10  .  diltiazem (CARDIZEM CD) 360 MG 24 hr capsule Take 1 capsule (360 mg total) by mouth daily. 90 capsule 3  . diltiazem (CARDIZEM LA) 300 MG 24 hr tablet Take 1 tablet (300 mg total) by mouth daily. 90 tablet 3  . fexofenadine (ALLEGRA) 180 MG tablet Take 180 mg by mouth daily.    Marland Kitchen  gabapentin (NEURONTIN) 100 MG capsule Take 100 mg by mouth at bedtime.  1  . magnesium hydroxide (DULCOLAX) 400 MG/5ML suspension Take 15 mLs by mouth daily as needed for mild constipation.    Marland Kitchen rOPINIRole (REQUIP) 1 MG tablet Take 1 mg by mouth at bedtime.  0  . traMADol (ULTRAM) 50 MG tablet Take 50 mg by mouth 2 (two) times daily.    . valACYclovir (VALTREX) 1000 MG tablet Take 2,000 mg by mouth 2 (two) times daily as needed (cold sore).     Marland Kitchen UNABLE TO FIND In and out foley catheter 14 Fr. Q# 20   Refills: none Insert catheter every 2 hours to empty bladder. 1 each 1   No current facility-administered medications on file prior to visit.   Allergies  Allergen Reactions  . Aspirin Nausea And Vomiting  . Ciprofloxacin Diarrhea  . Flagyl [Metronidazole] Diarrhea  . Penicillins Hives and Other (See Comments)    Did it involve swelling of the face/tongue/throat, SOB, or low BP? No Did it involve sudden or severe rash/hives, skin peeling, or any reaction on the inside of your mouth or nose? Yes Did you need to seek medical attention at a hospital or doctor's office? Yes When did it last happen?15+ years If all above answers are "NO", may proceed with cephalosporin use.    Past Medical History:  Diagnosis Date  . Arthritis   . Atrial fibrillation ([place])   . Cancer [facility])    BREAST CANCER / CERVICAL CANCER   . Cervical cancer ([place])   . Dysrhythmia   . History of breast cancer 2006   Dr. Sonny Dandy oncologist - left breast  . History of gout   . Hyperlipidemia   . Urinary retention Sept 2016   s/p hysterectomy   Past Surgical History:  Procedure Laterality Date  . BREAST SURGERY    . CARDIOVERSION N/A 06/03/2019   Procedure: CARDIOVERSION;  Surgeon: Jerline Pain, MD;  Location[facility] END[facility]COPY;  Service: Cardiovascular;  Laterality: N/A;  . CATARACT EXTRACTION W/ INTRAOCULAR LENS  IMPLANT, BILATERAL    . EYE SURGERY    . INGUINAL HERNIA REPAIR Right 12/14/2015   Procedure:  LAPAROSCOPIC AND OPEN RIGHT  INGUINAL HERNIA;  Surgeon: Johnathan Hausen, MD;  Location: [facility] ORS;  Service: General;  Laterality: Right;  . KNEE ARTHROSCOPY Left   . MASTECTOMY Left 2006  . MASTECTOMY WITH AXILLARY LYMPH NODE DISSECTION Left   . ROBOTIC ASSISTED TOTAL HYSTERECTOMY WITH BILATERAL SALPINGO OOPHERECTOMY Bilateral 04/07/2015   Procedure: ROBOTIC ASSISTED RADICAL HYSTERECTOMY BILATERAL SALPINGO OOPHORECTOMY BILATERAL SENTINEL LYMPHADENETOMY;  Surgeon: Everitt Amber, MD;  Location: [facility] ORS;  Service: Gynecology;  Laterality: Bilateral;  . TUBAL LIGATION     Family History  Problem Relation Age of Onset  . Prostate cancer Father   . Heart failure Father        9s  . Heart failure Mother        39  . Breast cancer Sister    Social History   Socioeconomic History  . Marital status: Married    Spouse name: Not on file  . Number of children: 1  .  Years of education: Not on file  . Highest education level: Not on file  Occupational History  . 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 Smoking status: Former Smoker    Packs/day: 1.00    Years: 30.00    Pack years: 30.00    Types: Cigarettes    Quit date: 07/23/1994    Years since quitting: 24.9  . Smokeless tobacco: Never Used  Substance and Sexual Activity  . Alcohol use: Never    Alcohol/week: 0.0 standard drinks  . Drug use: Never  . Sexual activity: Not Currently  Other Topics Concern  . Not on file  Social History Narrative   One daughter.  Lives with husband.     Social Determinants of Health   Financial Resource Strain:   . Difficulty of Paying Living Expenses: Not on file  Food Insecurity:   . Worried About Charity fundraiser in the Last Year: Not on file  . Ran Out of Food in the Last Year: Not on file  Transportation Needs:   . Lack of Transportation (Medical): Not on file  . Lack of Transportation (Non-Medical): Not on file  Physical Activity:   . Days of Exercise per Week:  Not on file  . Minutes of Exercise per Session: Not on file  Stress:   . Feeling of Stress : Not on file  Social Connections:   . Frequency of Communication with Friends and Family: Not on file  . Frequency of Social Gatherings with Friends and Family: Not on file  . Attends Religious Services: Not on file  . Active Member of Clubs or Organizations: Not on file  . Attends Archivist Meetings: Not on file  . Marital Status: Not on file  Intimate Partner Violence:   . Fear of Current or Ex-Partner: Not on file  . Emotionally Abused: Not on file  . Physically Abused: Not on file  . Sexually Abused: Not on file     Review of systems: Constitutional:  She has no weight gain or weight loss. She has no fever or chills. Eyes: No blurred vision Ears, Nose, Mouth, Throat: No dizziness, headaches or changes in hearing. No mouth sores. Cardiovascular: No chest pain, palpitations or edema. Respiratory:  No shortness of breath, wheezing or cough Gastrointestinal: She has normal bowel movements without diarrhea or constipation. She denies any nausea or vomiting. She denies blood in her stool or heart burn. Genitourinary:  She denies pelvic pain, pelvic pressure. She has no hematuria, dysuria, or incontinence. + urinary retention + bladder prolapse symptoms Musculoskeletal: Denies muscle weakness or joint pains Skin:  She has no skin changes, rashes or itching Neurological:  Denies dizziness or headaches. No neuropathy, no numbness or tingling. Psychiatric:  She denies depression or anxiety. Hematologic/Lymphatic:   No easy bruising or bleeding   Physical Exam: Blood pressure (!) 126/94, pulse (!) 106, temperature 98.3 F (36.8 C), resp. rate 16, height 5\' 3"  (1.6 m), weight 134 lb 8 oz (61 kg), SpO2 100 %. General: Well dressed, well nourished in no apparent distress.   HEENT:  Normocephalic and atraumatic, no lesions.  Extraocular muscles intact. Sclerae anicteric. Pupils equal,  round, reactive. No mouth sores or ulcers. Thyroid is normal size, not nodular, midline. Skin:  No lesions or rashes. Abdomen:  Soft, nontender, nondistended.  No palpable masses.  No hepatosplenomegaly.  No ascites. Normal bowel sounds. Incisions are well healed including hernia incision. Genitourinary: Normal EGBUS  No lesions.  No bleeding or discharge.  No cul de sac fullness. Bladder not palpably distended with urine.  No gross prolapse noted. Pap taken.  Extremities: No cyanosis, clubbing or edema.  No calf tenderness or erythema. No palpable cords. Psychiatric: Mood and affect are appropriate. Neurological: Awake, alert and oriented x 3. Sensation is intact, no neuropathy.  Musculoskeletal: No pain, normal strength and range of motion.  Thereasa Solo, MD

## 2019-07-02 NOTE — Telephone Encounter (Signed)
4:38 PM 07/02/2019  Incoming call from scheduling. Pt daughter on phone calling to follow up on pt Afib. Call transferred to nurse. DPR on file. Spoke with pt daughter, [name]. She states pt had blood in stool this past Friday. Pt was advised by her PCP to hold Eliquis. She is concerned about holding eliquis because of pt history Afib, stating that pt HR has been 'all over the place' and above 100 bpm most of the time. She states pt beta blocker not really helping. She also states that pt has upcoming knee replacement surgery on 12/21. She is aware that pt is tol hold Eliquis 3 daus prior to surgery, but wants Dr. [name] input on if pt should restart Eliquis and whaht will be done regarding rate control. She states that scheduling told her the next avaialble appt is 07/29/2019 in [place] but she wants to know if pt should be seen sooner. She states pt has CT abd/pelvis 12/16 as an FYI for scheduling  Call back number is [phone] mobile   Dr. [name] made aware of pt call during clinic. He will review this evening.

## 2019-07-02 NOTE — Progress Notes (Signed)
PCP - Dr. Stoney Bang Cardiologist - Dr. Jenkins Rouge  PPM/ICD - denies Device Orders - N/A Rep Notified - N/A  Chest x-ray - 07/02/2019 EKG - 07/02/2019 Stress Test - per patient  "about 10 or more years ago" ECHO - 04/23/2019 Cardiac Cath - denies  Sleep Study - denies CPAP - N/A  Blood Thinner Instructions: Eliquis: per patient, PCP instructed to stop taking on 06/26/2019.   Aspirin Instructions: Per patient patient last dose is to be on Saturday 07/11/2019  Jeneen Rinks, [place] notified of patient's EKG today at PAT, PA contacted cardiologist's office, cardiologist Dr. [name] to review patient's chart and follow-up with patient prior to surgery. Patient to go to ED if becomes symptomatic. Patient verbalized understanding of instructions.  ERAS Protcol - Yes PRE-SURGERY Ensure or G2- Ensure given   COVID TEST- Scheduled for 07/09/2019. Patient verbalized understanding of self-quarantine instructions, patient place and time  Anesthesia review: YES, hx of a-fib, abnormal EKG.  Patient denies shortness of breath, fever, cough and chest pain at PAT appointment  All instructions explained to the patient, with a verbal understanding of the material. Patient agrees to go over the instructions while at home for a better understanding. Patient also instructed to self quarantine after being tested for COVID-19. The opportunity to ask questions was provided.

## 2019-07-02 NOTE — Telephone Encounter (Signed)
I will forward to Dr.Hochrein for advise.

## 2019-07-02 NOTE — Telephone Encounter (Signed)
Patient's daughter [name] called in and stated mother's medications was sent to wrong pharmacy. Patient's daughter asking for refills to be sent to [facility]. Patient phone number is [phone]

## 2019-07-02 NOTE — Telephone Encounter (Signed)
Spoke with Karoline Caldwell PA from anesthesia  Pt is there for pre op work up EKG shows rate of 120  Per pt had taken Diltiazem this am  Per Jeneen Rinks pt has also stopped Eliquis and has been off of since 06/26/19 some sort of miscommunication Pre op note states pt may hold 3 days prior to surgery.Will forward to Dr [name] for review from previous note pt may need additional rate control.

## 2019-07-02 NOTE — Progress Notes (Unsigned)
septr

## 2019-07-02 NOTE — Telephone Encounter (Signed)
I would like for her to start metoprolol 25 mg PO bid.  Continue to hold he blood thinner until her PCP can sort out the bleeding in her BMs.  She likely could be seen in the [facility]ib Clinic soon or me virtually on Monday.

## 2019-07-02 NOTE — Telephone Encounter (Signed)
Patient c/o Palpitations:  High priority if patient c/o lightheadedness, shortness of breath, or chest pain  1) How long have you had palpitations/irregular HR/ Afib? Are you having the symptoms now? Record of Afib since 06/08/19, rate of 120 right now with no symptoms   2) Are you currently experiencing lightheadedness, SOB or CP? No  3) Do you have a history of afib (atrial fibrillation) or irregular heart rhythm? Yes   4) Have you checked your BP or HR? (document readings if available): 139/85 HR 120  5) Are you experiencing any other symptoms? No  James from [facility] is calling because he is currently with patient and she is excperiencing Afib & would like to know how to proceed with patient in regards to it. Patient was cleared for procedure by Kerin Ransom & has been off of Eliquis since 06/26/19 for procedure schedule on 07/13/19 even though she was only advised to stop 3 days before procedure. He feels the patient should start on Eliquis again until closer to procedure. Please Advise

## 2019-07-02 NOTE — Progress Notes (Addendum)
Anesthesia Chart Review:  Pt with hx of Afib s/p failed cardioversion 06/03/19. Last seen by Dr. [name] 06/08/19. He noted that she continued to have rapid response, Cardizem was increased and it if rate not improved beta blocker would be considered. She was seen at PAT on 12/10 and again noted to have afib with rapid ventricular response with rate 120. Pt seems to be unaware of this, she is asymptomatic, feels well. She reports taking her Cardizem at 6am. Her BP is 139/85. She also reports to have stopped her Eliquis on 12/4 at the direction of her PCP Dr. [name] for surgery scheduled 12/21. My assumption is that this was a miscommunication. She was cleared by cardiology in telephone encounter on 12/8 with instruction to stop Eliquis 3d preop. Due to uncontrolled ventricular rate and pt confusion about stopping anticoagulation I called [facility] for further advice. Given that pt was symptomatic it was felt she could leave PAT and her chart would be forwarded to Dr. [name] for review/advice. Pt was accompanied by her daughter and both were advised to seek treatment in ED if pt became symptomatic.  [name] [facility] Short Stay Center/Anesthesiology Phone [phone] 07/02/2019 11:16 AM    Addendum 07/06/19: Received OV notes from pt's PCP Dr. [name]. Her eliquis was stopped due to reported blood in stool and she was referred to GI. She has not yet been seen by GI. She had a telemedicine followup with Dr. [name] on 07/06/19 and her rate control and anticoagulation were discussed. Per note, "ATRIAL FIB:Ms.Pam [name] Corumhas a CHA2DS2 - VASc score of 3.   We had a long discussion about anticoagulation.  I again went through the fact that the aspirin she is currently taking does not protect her against stroke in this situation.  For now since she has an upcoming surgery very soon she can stay off the Eliquis.  However, she needs a GI evaluation if there is any thought that she is having  significant GI bleeding.  I would suggest restarting the Eliquis and getting a GI evaluation soon after her surgery.  I discussed this at length with the patient and her daughter and answered all their questions about this.  She seems to have good rate control and should continue on the Cardizem and metoprolol through the surgery.  I would not suggest holding this medicine prior would likely her heart rate will be too elevated.  PREOP: The patient has no high risk findings for the planned knee surgery.  According to ACC/AHA guidelines she is at acceptable risk for the surgery."  Preop labs reviewed, WNL.    TTE 04/23/19:  1. Left ventricular ejection fraction, by visual estimation, is 55 to 60%. The left ventricle has normal function. There is no left ventricular hypertrophy.  2. Global right ventricle has normal systolic function.The right ventricular size is normal. No increase in right ventricular wall thickness.  3. Left atrial size was severely dilated.  4. Right atrial size was moderately dilated.  5. The mitral valve is normal in structure. Mild mitral valve regurgitation. No evidence of mitral stenosis.  6. The tricuspid valve is normal in structure. Tricuspid valve regurgitation is trivial.  7. The aortic valve is normal in structure. Aortic valve regurgitation was not visualized by color flow Doppler.  8. The pulmonic valve was not well visualized. Pulmonic valve regurgitation is not visualized by color flow Doppler.  9. Mildly elevated pulmonary artery systolic pressure. 10. The inferior vena cava is normal in size  with greater than 50% respiratory variability, suggesting right atrial pressure of 3 mmHg. 11. Small patent foramen ovale with predominantly left to right shunting across the atrial septum. 12. 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 13. Evidence of atrial level shunting detected by color flow  Doppler.

## 2019-07-02 NOTE — Patient Instructions (Signed)
Please notify Dr Denman George at phone number [phone] if you notice vaginal bleeding, new pelvic or abdominal pains, bloating, feeling full easy, or a change in bladder or bowel function.   Dr Denman George is checking a CT scan before your surgery to check if there is a pelvic cause for your swelling.  Dr Denman George will see you back in June, 2021.

## 2019-07-02 NOTE — Telephone Encounter (Signed)
I called Abigail Butts.  Rx sent to [facility].

## 2019-07-03 NOTE — Telephone Encounter (Signed)
Returned call to daughter she states that she spoke with someone yesterday and pt has a telephone visit scheduled for monday

## 2019-07-05 NOTE — Progress Notes (Signed)
Virtual Visit via Telephone Note   This visit type was conducted due to national recommendations for restrictions regarding the COVID-19 Pandemic (e.g. social distancing) in an effort to limit this patient's exposure and mitigate transmission in our community.  Due to her co-morbid illnesses, this patient is at least at moderate risk for complications without adequate follow up.  This format is felt to be most appropriate for this patient at this time.  The patient did not have access to video technology/had technical difficulties with video requiring transitioning to audio format only (telephone).  All issues noted in this document were discussed and addressed.  No physical exam could be performed with this format.  Please refer to the patient's chart for her  consent to telehealth for [facility].   Date:  07/06/2019   ID:  Trany, Radden 19-Dec-1940, MRN [id number]  Patient Location: Home Provider Location: Home  PCP:  Neale Burly, MD  Cardiologist:  Minus Breeding, MD  Electrophysiologist:  None   Evaluation Performed:  Follow-Up Visit  Chief Complaint:  Palpitations.    History of Present Illness:    Pam [name] is a 79 y.o. female with who was referred by Neale Burly, MD for evaluation of atrial fib.   After I last saw her I ordered an echo which was unremarkable. Since I last saw her she had cardioversion last week.  She stayed in sinus rhythm she thinks for about 2 days.  She went back into atrial fib.  She called the other day because she was still having rapid rates.  I added beta blocker.      Since then she has done much better.  Her heart rate has not been in the 140s which was previously.  The peak she saw was 102.  She does not feel dizzy.  She does not feel any palpitations.  She did apparently have some spotty blood in her bowel movements and was taken off of her Eliquis by her primary provider.  She was told not to restart this until after surgery.   She apparently is going to see a gastroenterologist but has a knee surgery scheduled before Christmas.  She denies any chest pressure, neck or arm discomfort.  She has no new shortness of breath, PND or orthopnea.  She has no weight gain or edema.  She has had no further bleeding since she stopped the Eliquis.  The patient does not have symptoms concerning for COVID-19 infection (fever, chills, cough, or new shortness of breath).    Past Medical History:  Diagnosis Date  . Arthritis   . Atrial fibrillation ([place])   . Cancer [facility])    BREAST CANCER / CERVICAL CANCER   . Cervical cancer ([place])   . Dysrhythmia   . History of breast cancer 2006   Dr. Sonny Dandy oncologist - left breast  . History of gout   . Hyperlipidemia   . Urinary retention Sept 2016   s/p hysterectomy   Past Surgical History:  Procedure Laterality Date  . BREAST SURGERY    . CARDIOVERSION N/A 06/03/2019   Procedure: CARDIOVERSION;  Surgeon: Jerline Pain, MD;  Location[facility] ENDOSCOPY;  Service: Cardiovascular;  Laterality: N/A;  . CATARACT EXTRACTION W/ INTRAOCULAR LENS  IMPLANT, BILATERAL    . EYE SURGERY    . INGUINAL HERNIA REPAIR Right 12/14/2015   Procedure: LAPAROSCOPIC AND OPEN RIGHT  INGUINAL HERNIA;  Surgeon: Johnathan Hausen, MD;  Location: [facility] ORS;  Service: General;  Laterality: Right;  . KNEE ARTHROSCOPY Left   . MASTECTOMY Left 2006  . MASTECTOMY WITH AXILLARY LYMPH NODE DISSECTION Left   . ROBOTIC ASSISTED TOTAL HYSTERECTOMY WITH BILATERAL SALPINGO OOPHERECTOMY Bilateral 04/07/2015   Procedure: ROBOTIC ASSISTED RADICAL HYSTERECTOMY BILATERAL SALPINGO OOPHORECTOMY BILATERAL SENTINEL LYMPHADENETOMY;  Surgeon: Everitt Amber, MD;  Location: [facility] ORS;  Service: Gynecology;  Laterality: Bilateral;  . TUBAL LIGATION       Current Meds  Medication Sig  . aspirin EC 81 MG tablet Take 81 mg by mouth daily.  Marland Kitchen diltiazem (CARDIZEM CD) 360 MG 24 hr capsule Take 1 capsule (360 mg total) by mouth daily.  . fexofenadine  (ALLEGRA) 180 MG tablet Take 180 mg by mouth daily.  Marland Kitchen gabapentin (NEURONTIN) 100 MG capsule Take 100 mg by mouth at bedtime.  . magnesium hydroxide (DULCOLAX) 400 MG/5ML suspension Take 15 mLs by mouth daily as needed for mild constipation.  . metoprolol tartrate (LOPRESSOR) 25 MG tablet Take 1 tablet (25 mg total) by mouth 2 (two) times daily.  Marland Kitchen rOPINIRole (REQUIP) 1 MG tablet Take 1 mg by mouth at bedtime.  . sulfamethoxazole-trimethoprim (BACTRIM DS) 800-160 MG tablet Take 1 tablet by mouth 2 (two) times daily.  . traMADol (ULTRAM) 50 MG tablet Take 50 mg by mouth 2 (two) times daily.     Allergies:   Aspirin, Ciprofloxacin, Flagyl [metronidazole], and Penicillins   Social History   Tobacco Use  . Smoking status: Former Smoker    Packs/day: 1.00    Years: 30.00    Pack years: 30.00    Types: Cigarettes    Quit date: 07/23/1994    Years since quitting: 24.9  . Smokeless tobacco: Never Used  Substance Use Topics  . Alcohol use: Never    Alcohol/week: 0.0 standard drinks  . Drug use: Never     Family Hx: The patient's family history includes Breast cancer in her sister; Heart failure in her father and mother; Prostate cancer in her father.  ROS:   Please see the history of present illness.    As stated in the HPI and negative for all other systems. All other systems reviewed and are negative.   Prior CV studies:   The following studies were reviewed today:    Labs/Other Tests and Data Reviewed:    EKG:  No ECG reviewed.  Recent Labs: 04/23/2019: TSH 0.598 07/02/2019: ALT 24; BUN 9; Creatinine, Ser 0.79; Hemoglobin 12.9; Platelets 648; Potassium 4.2; Sodium 139   Recent Lipid Panel No results found for: CHOL, TRIG, HDL, CHOLHDL, LDLCALC, LDLDIRECT  Wt Readings from Last 3 Encounters:  07/02/19 134 lb 8 oz (61 kg)  07/02/19 135 lb 6 oz (61.4 kg)  06/25/19 138 lb (62.6 kg)     Objective:    Vital Signs:  BP 101/61 Comment: unable to provide  Pulse 78       ASSESSMENT & PLAN:     ATRIAL FIB:   Pam [name] has a CHA2DS2 - VASc score of 3.    We had a long discussion about anticoagulation.  I again went through the fact that the aspirin she is currently taking does not protect her against stroke in this situation.  For now since she has an upcoming surgery very soon she can stay off the Eliquis.  However, she needs a GI evaluation if there is any thought that she is having significant GI bleeding.  I would suggest restarting the Eliquis and getting a GI evaluation soon after her  surgery.  I discussed this at length with the patient and her daughter and answered all their questions about this.  She seems to have good rate control and should continue on the Cardizem and metoprolol through the surgery.  I would not suggest holding this medicine prior would likely her heart rate will be too elevated.  PREOP: The patient has no high risk findings for the planned knee surgery.  According to ACC/AHA guidelines she is at acceptable risk for the surgery.   COVID-19 Education: The signs and symptoms of COVID-19 were discussed with the patient and how to seek care for testing (follow up with PCP or arrange E-visit).  The importance of social distancing was discussed today.  Time:   Today, I have spent 25 minutes with the patient with telehealth technology discussing the above problems.     Medication Adjustments/Labs and Tests Ordered: Current medicines are reviewed at length with the patient today.  Concerns regarding medicines are outlined above.   Tests Ordered: No orders of the defined types were placed in this encounter.   Medication Changes: No orders of the defined types were placed in this encounter.   Follow Up:  In person or virtual in one month.   Signed, Minus Breeding, MD  07/06/2019 4:09 PM    [facility]

## 2019-07-06 ENCOUNTER — Telehealth: Payer: Self-pay

## 2019-07-06 ENCOUNTER — Telehealth: Payer: Self-pay | Admitting: Orthopaedic Surgery

## 2019-07-06 ENCOUNTER — Telehealth (INDEPENDENT_AMBULATORY_CARE_PROVIDER_SITE_OTHER): Payer: Medicare Other | Admitting: Cardiology

## 2019-07-06 ENCOUNTER — Encounter: Payer: Self-pay | Admitting: Cardiology

## 2019-07-06 VITALS — BP 101/61 | HR 78

## 2019-07-06 DIAGNOSIS — I4819 Other persistent atrial fibrillation: Secondary | ICD-10-CM | POA: Diagnosis not present

## 2019-07-06 LAB — CYTOLOGY - PAP
Comment: NEGATIVE
Diagnosis: NEGATIVE
High risk HPV: NEGATIVE

## 2019-07-06 NOTE — Telephone Encounter (Signed)
Patient already had an appointment scheduled for 07/29/19 in [place].

## 2019-07-06 NOTE — Patient Instructions (Signed)
Medication Instructions:  Your physician recommends that you continue on your current medications as directed. Please refer to the Current Medication list given to you today.' *If you need a refill on your cardiac medications before your next appointment, please call your pharmacy*  Lab Work: NONE If you have labs (blood work) drawn today and your tests are completely normal, you will receive your results only by: Marland Kitchen MyChart Message (if you have MyChart) OR . A paper copy in the mail If you have any lab test that is abnormal or we need to change your treatment, we will call you to review the results.  Testing/Procedures: NONE  Follow-Up: At [facility], you and your health needs are our priority.  As part of our continuing mission to provide you with exceptional heart care, we have created designated Provider Care Teams.  These Care Teams include your primary Cardiologist (physician) and Advanced Practice Providers (APPs -  Physician Assistants and Nurse Practitioners) who all work together to provide you with the care you need, when you need it.  Your next appointment:   1 month(s)  The format for your next appointment:   Either In Person (AT [place]) or Virtual  Provider:   DR. [name]

## 2019-07-06 NOTE — Anesthesia Preprocedure Evaluation (Addendum)
Anesthesia Evaluation  Patient identified by MRN, date of birth, ID band Patient awake    Reviewed: Allergy & Precautions, NPO status , Patient's Chart, lab work & pertinent test results  History of Anesthesia Complications Negative for: history of anesthetic complications  Airway Mallampati: II  TM Distance: >3 FB Neck ROM: Full    Dental  (+) Edentulous Upper, Edentulous Lower   Pulmonary former smoker,    Pulmonary exam normal        Cardiovascular (-) angina+ dysrhythmias Atrial Fibrillation  Rhythm:Irregular Rate:Normal   PAT note - Received OV notes from pt's PCP Dr. [name]. Her eliquis was stopped due to reported blood in stool and she was referred to GI. She has not yet been seen by GI. She had a telemedicine followup with Dr. [name] on 07/06/19 and her rate control and anticoagulation were discussed. Per note, "ATRIAL FIB:   Ms. THEOLA GACHUPIN has a CHA2DS2 - VASc score of 3.    We had a long discussion about anticoagulation.  I again went through the fact that the aspirin she is currently taking does not protect her against stroke in this situation.  For now since she has an upcoming surgery very soon she can stay off the Eliquis.  However, she needs a GI evaluation if there is any thought that she is having significant GI bleeding.  I would suggest restarting the Eliquis and getting a GI evaluation soon after her surgery.  I discussed this at length with the patient and her daughter and answered all their questions about this.  She seems to have good rate control and should continue on the Cardizem and metoprolol through the surgery.  I would not suggest holding this medicine prior would likely her heart rate will be too elevated.    Neuro/Psych negative neurological ROS  negative psych ROS   GI/Hepatic negative GI ROS, Neg liver ROS,   Endo/Other  negative endocrine ROS  Renal/GU negative Renal ROS  Female GU  complaint     Musculoskeletal  (+) Arthritis ,  Gout    Abdominal   Peds  Hematology negative hematology ROS (+)  Plt 648     Anesthesia Other Findings Covid neg 12/17  Reproductive/Obstetrics  Breast and cervical cancers                            Anesthesia Physical Anesthesia Plan  ASA: III  Anesthesia Plan: Spinal   Post-op Pain Management:  Regional for Post-op pain   Induction:   PONV Risk Score and Plan: 2 and Treatment may vary due to age or medical condition and Propofol infusion  Airway Management Planned: Natural Airway and Simple Face Mask  Additional Equipment: None  Intra-op Plan:   Post-operative Plan:   Informed Consent: I have reviewed the patients History and Physical, chart, labs and discussed the procedure including the risks, benefits and alternatives for the proposed anesthesia with the patient or authorized representative who has indicated his/her understanding and acceptance.       Plan Discussed with: CRNA and Anesthesiologist  Anesthesia Plan Comments: (Labs reviewed, platelets acceptable. Discussed risks and benefits of spinal, including spinal/epidural hematoma, infection, failed block, and PDPH. Patient expressed understanding and wished to proceed. )      Anesthesia Quick Evaluation

## 2019-07-06 NOTE — Telephone Encounter (Signed)
OK to stop 5 days before surgery since she will be getting a block / spinal thanks

## 2019-07-06 NOTE — Telephone Encounter (Signed)
Patient and/or DPR-approved person aware of AVS instructions and verbalized understanding.  Letter including After Visit Summary and any other necessary documents to be mailed to the patient's address on file.  Staff message sent to Medical Records pool to mail AVS to patient address.

## 2019-07-06 NOTE — Telephone Encounter (Signed)
I called patient and advised. 

## 2019-07-06 NOTE — Telephone Encounter (Signed)
Told Ms Ferner the results of the pap smear as noted below by Melissa Cross,NP. Pt verbalized understanding.

## 2019-07-06 NOTE — Telephone Encounter (Signed)
-----   Message from Dorothyann Gibbs, NP sent at 07/06/2019  1:43 PM EST ----- Can you let her know her pap was negative with HPV high risk negative

## 2019-07-06 NOTE — Telephone Encounter (Signed)
Pt called in wanting to verify if she needs to stop taking baby asprin prior to surgery 07/13/2019. Please give her a call   [phone]

## 2019-07-06 NOTE — Telephone Encounter (Signed)
Please advise 

## 2019-07-08 ENCOUNTER — Ambulatory Visit (HOSPITAL_COMMUNITY)
Admission: RE | Admit: 2019-07-08 | Discharge: 2019-07-08 | Disposition: A | Discharge status: Home / Self Care | Payer: Medicare Other | Source: Ambulatory Visit | Attending: Gynecologic Oncology | Admitting: Gynecologic Oncology

## 2019-07-08 ENCOUNTER — Other Ambulatory Visit: Payer: Self-pay

## 2019-07-08 DIAGNOSIS — C539 Malignant neoplasm of cervix uteri, unspecified: Secondary | ICD-10-CM | POA: Insufficient documentation

## 2019-07-08 MED ORDER — IOHEXOL 300 MG/ML  SOLN
100.0000 mL | Freq: Once | INTRAMUSCULAR | Status: AC | PRN
  Administered 2019-07-08: 13:00:00 80 mL via INTRAVENOUS

## 2019-07-08 MED ORDER — SODIUM CHLORIDE (PF) 0.9 % IJ SOLN
INTRAMUSCULAR | Status: AC
  Filled 2019-07-08: qty 50

## 2019-07-09 ENCOUNTER — Other Ambulatory Visit (HOSPITAL_COMMUNITY)
Admission: RE | Admit: 2019-07-09 | Discharge: 2019-07-09 | Disposition: A | Discharge status: Home / Self Care | Payer: Medicare Other | Source: Ambulatory Visit | Attending: Orthopaedic Surgery | Admitting: Orthopaedic Surgery

## 2019-07-09 ENCOUNTER — Ambulatory Visit: Payer: Medicare Other | Admitting: Orthopaedic Surgery

## 2019-07-09 ENCOUNTER — Telehealth: Payer: Self-pay

## 2019-07-09 DIAGNOSIS — Z20828 Contact with and (suspected) exposure to other viral communicable diseases: Secondary | ICD-10-CM | POA: Diagnosis not present

## 2019-07-09 DIAGNOSIS — Z01812 Encounter for preprocedural laboratory examination: Secondary | ICD-10-CM | POA: Diagnosis present

## 2019-07-09 NOTE — Telephone Encounter (Signed)
Told Ms Blitch that the CT scan showes no evidence of recurrent or metastatic carcinoma in the abdomen or pelvis per Joylene John, NP.

## 2019-07-10 ENCOUNTER — Telehealth: Payer: Self-pay | Admitting: *Deleted

## 2019-07-10 ENCOUNTER — Telehealth: Payer: Self-pay | Admitting: Radiology

## 2019-07-10 LAB — SARS CORONAVIRUS 2 (TAT 6-24 HRS): SARS Coronavirus 2: NEGATIVE

## 2019-07-10 NOTE — Telephone Encounter (Signed)
Ortho bundle pre-op call completed. 

## 2019-07-10 NOTE — Care Plan (Signed)
RNCM pre-op call to patient to discuss Ortho bundle through [facility]. Reviewed all pre- and post-op questions related to her upcoming Left TKA with Dr. [name] on Monday, 07/13/19. Patient lives with her spouse and will have assistance by her husband and daughter. She has all DME needed (FWW, BSC, cane). Anticipate HHPT after short hospital stay. Choice provided. Referral to [facility]. Will set up OPPT for a facility near her for around 2 weeks post-op. Will continue to follow for all CM needs.

## 2019-07-10 NOTE — Telephone Encounter (Signed)
Patient's daughter left voicemail asking for edit to be made to [place] paperwork to include 06/25/2019 date when she brought mom to her appt.  This is already listed on the paperwork as the date condition commenced. I tried to reach Abigail Butts but voicemail is full. Will try again on Monday morning.

## 2019-07-13 ENCOUNTER — Encounter (HOSPITAL_COMMUNITY)
Admission: RE | Disposition: A | Discharge status: Home Health | Payer: Self-pay | Source: Home / Self Care | Attending: Orthopaedic Surgery

## 2019-07-13 ENCOUNTER — Ambulatory Visit (HOSPITAL_COMMUNITY): Payer: Medicare Other | Admitting: Physician Assistant

## 2019-07-13 ENCOUNTER — Encounter (HOSPITAL_COMMUNITY): Payer: Self-pay | Admitting: Orthopaedic Surgery

## 2019-07-13 ENCOUNTER — Observation Stay (HOSPITAL_COMMUNITY)
Admission: RE | Admit: 2019-07-13 | Discharge: 2019-07-14 | Disposition: A | Discharge status: Home Health | Payer: Medicare Other | Attending: Orthopaedic Surgery | Admitting: Orthopaedic Surgery

## 2019-07-13 ENCOUNTER — Observation Stay (HOSPITAL_COMMUNITY): Payer: Medicare Other

## 2019-07-13 ENCOUNTER — Ambulatory Visit (HOSPITAL_COMMUNITY): Payer: Medicare Other | Admitting: Certified Registered"

## 2019-07-13 ENCOUNTER — Other Ambulatory Visit: Payer: Self-pay

## 2019-07-13 DIAGNOSIS — Z880 Allergy status to penicillin: Secondary | ICD-10-CM | POA: Diagnosis not present

## 2019-07-13 DIAGNOSIS — Z792 Long term (current) use of antibiotics: Secondary | ICD-10-CM | POA: Diagnosis not present

## 2019-07-13 DIAGNOSIS — Z87891 Personal history of nicotine dependence: Secondary | ICD-10-CM | POA: Insufficient documentation

## 2019-07-13 DIAGNOSIS — Z886 Allergy status to analgesic agent status: Secondary | ICD-10-CM | POA: Diagnosis not present

## 2019-07-13 DIAGNOSIS — M1712 Unilateral primary osteoarthritis, left knee: Secondary | ICD-10-CM | POA: Diagnosis not present

## 2019-07-13 DIAGNOSIS — I4819 Other persistent atrial fibrillation: Secondary | ICD-10-CM | POA: Diagnosis not present

## 2019-07-13 DIAGNOSIS — Z853 Personal history of malignant neoplasm of breast: Secondary | ICD-10-CM | POA: Diagnosis not present

## 2019-07-13 DIAGNOSIS — Z7901 Long term (current) use of anticoagulants: Secondary | ICD-10-CM | POA: Diagnosis not present

## 2019-07-13 DIAGNOSIS — I480 Paroxysmal atrial fibrillation: Secondary | ICD-10-CM | POA: Insufficient documentation

## 2019-07-13 DIAGNOSIS — Z8541 Personal history of malignant neoplasm of cervix uteri: Secondary | ICD-10-CM | POA: Diagnosis not present

## 2019-07-13 DIAGNOSIS — E785 Hyperlipidemia, unspecified: Secondary | ICD-10-CM | POA: Diagnosis not present

## 2019-07-13 DIAGNOSIS — Z881 Allergy status to other antibiotic agents status: Secondary | ICD-10-CM | POA: Insufficient documentation

## 2019-07-13 DIAGNOSIS — Z7982 Long term (current) use of aspirin: Secondary | ICD-10-CM | POA: Diagnosis not present

## 2019-07-13 DIAGNOSIS — Z79899 Other long term (current) drug therapy: Secondary | ICD-10-CM | POA: Insufficient documentation

## 2019-07-13 DIAGNOSIS — Z09 Encounter for follow-up examination after completed treatment for conditions other than malignant neoplasm: Secondary | ICD-10-CM

## 2019-07-13 HISTORY — PX: TOTAL KNEE ARTHROPLASTY: SHX125

## 2019-07-13 LAB — PROTIME-INR
INR: 1 (ref 0.8–1.2)
Prothrombin Time: 12.8 seconds (ref 11.4–15.2)

## 2019-07-13 SURGERY — ARTHROPLASTY, KNEE, TOTAL
Anesthesia: Spinal | Site: Knee | Laterality: Left

## 2019-07-13 MED ORDER — BUPIVACAINE HCL (PF) 0.25 % IJ SOLN
INTRAMUSCULAR | Status: DC | PRN
  Administered 2019-07-13: 20 mL

## 2019-07-13 MED ORDER — ONDANSETRON HCL 4 MG/2ML IJ SOLN: 4.0000 mg | Freq: Four times a day (QID) | INTRAMUSCULAR | Status: DC | PRN

## 2019-07-13 MED ORDER — OXYCODONE HCL 5 MG PO TABS: 5.0000 mg | ORAL_TABLET | Freq: Once | ORAL | Status: DC | PRN

## 2019-07-13 MED ORDER — PHENOL 1.4 % MT LIQD: 1.0000 | OROMUCOSAL | Status: DC | PRN

## 2019-07-13 MED ORDER — METOPROLOL TARTRATE 25 MG PO TABS
25.0000 mg | ORAL_TABLET | Freq: Two times a day (BID) | ORAL | Status: DC
  Administered 2019-07-13 – 2019-07-14 (×2): 25 mg via ORAL
  Filled 2019-07-13 (×2): qty 1

## 2019-07-13 MED ORDER — PROPOFOL 500 MG/50ML IV EMUL
INTRAVENOUS | Status: DC | PRN
  Administered 2019-07-13: 60 ug/kg/min via INTRAVENOUS

## 2019-07-13 MED ORDER — ONDANSETRON HCL 4 MG PO TABS: 4.0000 mg | ORAL_TABLET | Freq: Four times a day (QID) | ORAL | Status: DC | PRN

## 2019-07-13 MED ORDER — FENTANYL CITRATE (PF) 100 MCG/2ML IJ SOLN: 50.0000 ug | Freq: Once | INTRAMUSCULAR | Status: AC

## 2019-07-13 MED ORDER — MENTHOL 3 MG MT LOZG
1.0000 | LOZENGE | OROMUCOSAL | Status: DC | PRN
  Filled 2019-07-13: qty 9

## 2019-07-13 MED ORDER — ONDANSETRON HCL 4 MG/2ML IJ SOLN: 4.0000 mg | Freq: Once | INTRAMUSCULAR | Status: DC | PRN

## 2019-07-13 MED ORDER — DOCUSATE SODIUM 100 MG PO CAPS
100.0000 mg | ORAL_CAPSULE | Freq: Two times a day (BID) | ORAL | Status: DC
  Administered 2019-07-13 – 2019-07-14 (×2): 100 mg via ORAL
  Filled 2019-07-13 (×2): qty 1

## 2019-07-13 MED ORDER — VANCOMYCIN HCL IN DEXTROSE 1-5 GM/200ML-% IV SOLN
INTRAVENOUS | Status: AC
  Administered 2019-07-13: 1000 mg via INTRAVENOUS
  Filled 2019-07-13: qty 200

## 2019-07-13 MED ORDER — ACETAMINOPHEN 325 MG PO TABS
325.0000 mg | ORAL_TABLET | Freq: Four times a day (QID) | ORAL | Status: DC | PRN
  Filled 2019-07-13 (×2): qty 2

## 2019-07-13 MED ORDER — VANCOMYCIN HCL 1000 MG IV SOLR
INTRAVENOUS | Status: DC | PRN
  Administered 2019-07-13: 1000 mg via INTRAVENOUS

## 2019-07-13 MED ORDER — ROPINIROLE HCL 0.5 MG PO TABS
1.0000 mg | ORAL_TABLET | Freq: Every day | ORAL | Status: DC
  Administered 2019-07-13: 1 mg via ORAL
  Filled 2019-07-13: qty 2

## 2019-07-13 MED ORDER — FENTANYL CITRATE (PF) 100 MCG/2ML IJ SOLN
INTRAMUSCULAR | Status: AC
  Administered 2019-07-13: 50 ug via INTRAVENOUS
  Filled 2019-07-13: qty 2

## 2019-07-13 MED ORDER — MIDAZOLAM HCL 2 MG/2ML IJ SOLN
INTRAMUSCULAR | Status: AC
  Filled 2019-07-13: qty 2

## 2019-07-13 MED ORDER — DEXAMETHASONE SODIUM PHOSPHATE 10 MG/ML IJ SOLN
INTRAMUSCULAR | Status: AC
  Filled 2019-07-13: qty 1

## 2019-07-13 MED ORDER — METOCLOPRAMIDE HCL 5 MG PO TABS: 5.0000 mg | ORAL_TABLET | Freq: Three times a day (TID) | ORAL | Status: DC | PRN

## 2019-07-13 MED ORDER — LORATADINE 10 MG PO TABS
10.0000 mg | ORAL_TABLET | Freq: Every day | ORAL | Status: DC
  Administered 2019-07-14: 10 mg via ORAL
  Filled 2019-07-13: qty 1

## 2019-07-13 MED ORDER — SODIUM CHLORIDE 0.9 % IV SOLN: INTRAVENOUS | Status: DC

## 2019-07-13 MED ORDER — OXYCODONE HCL 5 MG PO TABS
5.0000 mg | ORAL_TABLET | ORAL | Status: DC | PRN
  Administered 2019-07-13 – 2019-07-14 (×4): 5 mg via ORAL
  Filled 2019-07-13 (×5): qty 1

## 2019-07-13 MED ORDER — ONDANSETRON HCL 4 MG/2ML IJ SOLN
INTRAMUSCULAR | Status: AC
  Filled 2019-07-13: qty 2

## 2019-07-13 MED ORDER — PHENYLEPHRINE HCL-NACL 10-0.9 MG/250ML-% IV SOLN
INTRAVENOUS | Status: DC | PRN
  Administered 2019-07-13: 30 ug/min via INTRAVENOUS

## 2019-07-13 MED ORDER — LACTATED RINGERS IV SOLN: INTRAVENOUS | Status: DC

## 2019-07-13 MED ORDER — ROPIVACAINE HCL 7.5 MG/ML IJ SOLN
INTRAMUSCULAR | Status: DC | PRN
  Administered 2019-07-13: 15 mL via PERINEURAL

## 2019-07-13 MED ORDER — BUPIVACAINE IN DEXTROSE 0.75-8.25 % IT SOLN
INTRATHECAL | Status: DC | PRN
  Administered 2019-07-13: 1.6 mg via INTRATHECAL

## 2019-07-13 MED ORDER — OXYCODONE HCL 5 MG/5ML PO SOLN: 5.0000 mg | Freq: Once | ORAL | Status: DC | PRN

## 2019-07-13 MED ORDER — FENTANYL CITRATE (PF) 250 MCG/5ML IJ SOLN
INTRAMUSCULAR | Status: AC
  Filled 2019-07-13: qty 5

## 2019-07-13 MED ORDER — BUPIVACAINE LIPOSOME 1.3 % IJ SUSP
20.0000 mL | Freq: Once | INTRAMUSCULAR | Status: DC
  Filled 2019-07-13: qty 20

## 2019-07-13 MED ORDER — LIDOCAINE 2% (20 MG/ML) 5 ML SYRINGE
INTRAMUSCULAR | Status: AC
  Filled 2019-07-13: qty 5

## 2019-07-13 MED ORDER — DILTIAZEM HCL ER COATED BEADS 180 MG PO CP24
360.0000 mg | ORAL_CAPSULE | Freq: Every day | ORAL | Status: DC
  Administered 2019-07-14: 360 mg via ORAL
  Filled 2019-07-13 (×2): qty 2

## 2019-07-13 MED ORDER — GABAPENTIN 100 MG PO CAPS
100.0000 mg | ORAL_CAPSULE | Freq: Every day | ORAL | Status: DC
  Administered 2019-07-13: 100 mg via ORAL
  Filled 2019-07-13: qty 1

## 2019-07-13 MED ORDER — HYDROMORPHONE HCL 1 MG/ML IJ SOLN
0.5000 mg | INTRAMUSCULAR | Status: DC | PRN
  Administered 2019-07-13 (×2): 0.5 mg via INTRAVENOUS
  Filled 2019-07-13 (×3): qty 1

## 2019-07-13 MED ORDER — BUPIVACAINE LIPOSOME 1.3 % IJ SUSP
INTRAMUSCULAR | Status: DC | PRN
  Administered 2019-07-13: 20 mL

## 2019-07-13 MED ORDER — ROCURONIUM BROMIDE 10 MG/ML (PF) SYRINGE
PREFILLED_SYRINGE | INTRAVENOUS | Status: AC
  Filled 2019-07-13: qty 10

## 2019-07-13 MED ORDER — METOCLOPRAMIDE HCL 5 MG/ML IJ SOLN: 5.0000 mg | Freq: Three times a day (TID) | INTRAMUSCULAR | Status: DC | PRN

## 2019-07-13 MED ORDER — 0.9 % SODIUM CHLORIDE (POUR BTL) OPTIME
TOPICAL | Status: DC | PRN
  Administered 2019-07-13: 1000 mL

## 2019-07-13 MED ORDER — BUPIVACAINE HCL (PF) 0.25 % IJ SOLN
INTRAMUSCULAR | Status: AC
  Filled 2019-07-13: qty 30

## 2019-07-13 MED ORDER — SODIUM CHLORIDE 0.9 % IR SOLN
Status: DC | PRN
  Administered 2019-07-13: 3000 mL

## 2019-07-13 MED ORDER — METHOCARBAMOL 500 MG PO TABS
500.0000 mg | ORAL_TABLET | Freq: Four times a day (QID) | ORAL | Status: DC | PRN
  Administered 2019-07-13 – 2019-07-14 (×2): 500 mg via ORAL
  Filled 2019-07-13 (×3): qty 1

## 2019-07-13 MED ORDER — CHLORHEXIDINE GLUCONATE 4 % EX LIQD: 60.0000 mL | Freq: Once | CUTANEOUS | Status: DC

## 2019-07-13 MED ORDER — METHOCARBAMOL 1000 MG/10ML IJ SOLN
500.0000 mg | Freq: Four times a day (QID) | INTRAVENOUS | Status: DC | PRN
  Filled 2019-07-13: qty 5

## 2019-07-13 MED ORDER — APIXABAN 5 MG PO TABS: 5.0000 mg | ORAL_TABLET | Freq: Two times a day (BID) | ORAL | Status: DC

## 2019-07-13 MED ORDER — VANCOMYCIN HCL IN DEXTROSE 1-5 GM/200ML-% IV SOLN: 1000.0000 mg | INTRAVENOUS | Status: AC

## 2019-07-13 MED ORDER — FENTANYL CITRATE (PF) 100 MCG/2ML IJ SOLN: 25.0000 ug | INTRAMUSCULAR | Status: DC | PRN

## 2019-07-13 MED ORDER — TRANEXAMIC ACID-NACL 1000-0.7 MG/100ML-% IV SOLN
INTRAVENOUS | Status: AC
  Filled 2019-07-13: qty 100

## 2019-07-13 MED ORDER — MAGNESIUM HYDROXIDE 400 MG/5ML PO SUSP: 15.0000 mL | Freq: Every day | ORAL | Status: DC | PRN

## 2019-07-13 SURGICAL SUPPLY — 91 items
APL SKNCLS STERI-STRIP NONHPOA (GAUZE/BANDAGES/DRESSINGS) ×1
ATTUNE PS FEM LT SZ 4 CEM KNEE (Femur) ×2 IMPLANT
ATTUNE PSRP INSR SZ4 5 KNEE (Insert) ×1 IMPLANT
ATTUNE PSRP INSR SZ4 5MM KNEE (Insert) ×1 IMPLANT
BANDAGE ESMARK 6X9 LF (GAUZE/BANDAGES/DRESSINGS) ×1 IMPLANT
BASE TIBIAL ROT PLAT SZ 5 KNEE (Knees) ×1 IMPLANT
BENZOIN TINCTURE PRP APPL 2/3 (GAUZE/BANDAGES/DRESSINGS) ×3 IMPLANT
BLADE SAGITTAL 25.0X1.19X90 (BLADE) ×2 IMPLANT
BLADE SAGITTAL 25.0X1.19X90MM (BLADE) ×1
BLADE SAW SGTL 13X75X1.27 (BLADE) ×3 IMPLANT
BNDG CMPR 9X6 STRL LF SNTH (GAUZE/BANDAGES/DRESSINGS) ×1
BNDG CMPR MED 10X6 ELC LF (GAUZE/BANDAGES/DRESSINGS) ×1
BNDG CMPR MED 15X6 ELC VLCR LF (GAUZE/BANDAGES/DRESSINGS) ×1
BNDG ELASTIC 4X5.8 VLCR STR LF (GAUZE/BANDAGES/DRESSINGS) ×3 IMPLANT
BNDG ELASTIC 6X10 VLCR STRL LF (GAUZE/BANDAGES/DRESSINGS) ×3 IMPLANT
BNDG ELASTIC 6X15 VLCR STRL LF (GAUZE/BANDAGES/DRESSINGS) ×2 IMPLANT
BNDG ESMARK 6X9 LF (GAUZE/BANDAGES/DRESSINGS) ×3
BOWL SMART MIX CTS (DISPOSABLE) ×3 IMPLANT
BSPLAT TIB 5 CMNT ROT PLAT STR (Knees) ×1 IMPLANT
CEMENT HV SMART SET (Cement) ×6 IMPLANT
CLOSURE STERI-STRIP 1/2X4 (GAUZE/BANDAGES/DRESSINGS) ×1
CLOSURE WOUND 1/2 X4 (GAUZE/BANDAGES/DRESSINGS) ×2
CLSR STERI-STRIP ANTIMIC 1/2X4 (GAUZE/BANDAGES/DRESSINGS) ×1 IMPLANT
COVER SURGICAL LIGHT HANDLE (MISCELLANEOUS) ×3 IMPLANT
COVER WAND RF STERILE (DRAPES) ×3 IMPLANT
CUFF TOURN SGL QUICK 34 (TOURNIQUET CUFF) ×3
CUFF TOURN SGL QUICK 42 (TOURNIQUET CUFF) IMPLANT
CUFF TRNQT CYL 34X4.125X (TOURNIQUET CUFF) ×1 IMPLANT
DRAPE ORTHO SPLIT 77X108 STRL (DRAPES) ×6
DRAPE SURG ORHT 6 SPLT 77X108 (DRAPES) ×2 IMPLANT
DRAPE U-SHAPE 47X51 STRL (DRAPES) ×3 IMPLANT
DRSG PAD ABDOMINAL 8X10 ST (GAUZE/BANDAGES/DRESSINGS) ×3 IMPLANT
DURAPREP 26ML APPLICATOR (WOUND CARE) ×6 IMPLANT
ELECT REM PT RETURN 9FT ADLT (ELECTROSURGICAL) ×3
ELECTRODE REM PT RTRN 9FT ADLT (ELECTROSURGICAL) ×1 IMPLANT
EVACUATOR 1/8 PVC DRAIN (DRAIN) IMPLANT
FACESHIELD WRAPAROUND (MASK) ×6 IMPLANT
FACESHIELD WRAPAROUND OR TEAM (MASK) ×2 IMPLANT
GAUZE SPONGE 4X4 12PLY STRL (GAUZE/BANDAGES/DRESSINGS) ×3 IMPLANT
GAUZE SPONGE 4X4 16PLY XRAY LF (GAUZE/BANDAGES/DRESSINGS) ×2 IMPLANT
GAUZE XEROFORM 5X9 LF (GAUZE/BANDAGES/DRESSINGS) ×3 IMPLANT
GLOVE BIOGEL PI IND STRL 8 (GLOVE) ×2 IMPLANT
GLOVE BIOGEL PI INDICATOR 8 (GLOVE) ×4
GLOVE ORTHO TXT STRL SZ7.5 (GLOVE) ×6 IMPLANT
GOWN STRL REUS W/ TWL LRG LVL3 (GOWN DISPOSABLE) ×1 IMPLANT
GOWN STRL REUS W/ TWL XL LVL3 (GOWN DISPOSABLE) ×1 IMPLANT
GOWN STRL REUS W/TWL 2XL LVL3 (GOWN DISPOSABLE) ×3 IMPLANT
GOWN STRL REUS W/TWL LRG LVL3 (GOWN DISPOSABLE) ×3
GOWN STRL REUS W/TWL XL LVL3 (GOWN DISPOSABLE) ×3
HANDPIECE INTERPULSE COAX TIP (DISPOSABLE) ×3
IMMOBILIZER KNEE 22 UNIV (SOFTGOODS) ×3 IMPLANT
KIT BASIN OR (CUSTOM PROCEDURE TRAY) ×3 IMPLANT
KIT TURNOVER KIT B (KITS) ×3 IMPLANT
MANIFOLD NEPTUNE II (INSTRUMENTS) ×3 IMPLANT
MARKER SKIN DUAL TIP RULER LAB (MISCELLANEOUS) ×3 IMPLANT
NDL 18GX1X1/2 (RX/OR ONLY) (NEEDLE) ×1 IMPLANT
NDL HYPO 25GX1X1/2 BEV (NEEDLE) ×1 IMPLANT
NEEDLE 18GX1X1/2 (RX/OR ONLY) (NEEDLE) ×3 IMPLANT
NEEDLE HYPO 25GX1X1/2 BEV (NEEDLE) ×3 IMPLANT
NS IRRIG 1000ML POUR BTL (IV SOLUTION) ×3 IMPLANT
PACK TOTAL JOINT (CUSTOM PROCEDURE TRAY) ×3 IMPLANT
PAD ARMBOARD 7.5X6 YLW CONV (MISCELLANEOUS) ×6 IMPLANT
PAD CAST 4YDX4 CTTN HI CHSV (CAST SUPPLIES) ×1 IMPLANT
PADDING CAST ABS 6INX4YD NS (CAST SUPPLIES) ×2
PADDING CAST ABS COTTON 6X4 NS (CAST SUPPLIES) IMPLANT
PADDING CAST COTTON 4X4 STRL (CAST SUPPLIES) ×3
PADDING CAST COTTON 6X4 STRL (CAST SUPPLIES) ×3 IMPLANT
PATELLA MEDIAL ATTUN 35MM KNEE (Knees) ×2 IMPLANT
PIN STEINMAN FIXATION KNEE (PIN) ×2 IMPLANT
SET HNDPC FAN SPRY TIP SCT (DISPOSABLE) ×1 IMPLANT
STAPLER VISISTAT 35W (STAPLE) IMPLANT
STRIP CLOSURE SKIN 1/2X4 (GAUZE/BANDAGES/DRESSINGS) ×4 IMPLANT
SUCTION FRAZIER HANDLE 10FR (MISCELLANEOUS) ×2
SUCTION TUBE FRAZIER 10FR DISP (MISCELLANEOUS) ×1 IMPLANT
SUT ETHIBOND NAB CT1 #1 30IN (SUTURE) ×3 IMPLANT
SUT VIC AB 0 CT1 27 (SUTURE)
SUT VIC AB 0 CT1 27XBRD ANBCTR (SUTURE) ×1 IMPLANT
SUT VIC AB 1 CT1 27 (SUTURE) ×6
SUT VIC AB 1 CT1 27XBRD ANBCTR (SUTURE) ×2 IMPLANT
SUT VIC AB 1 CTX 27 (SUTURE) ×2 IMPLANT
SUT VIC AB 1 CTX 36 (SUTURE)
SUT VIC AB 1 CTX36XBRD ANBCTR (SUTURE) ×2 IMPLANT
SUT VIC AB 2-0 CT1 27 (SUTURE)
SUT VIC AB 2-0 CT1 TAPERPNT 27 (SUTURE) ×2 IMPLANT
SUT VIC AB 3-0 X1 27 (SUTURE) ×1 IMPLANT
SYR 50ML LL SCALE MARK (SYRINGE) ×3 IMPLANT
SYR CONTROL 10ML LL (SYRINGE) ×3 IMPLANT
TIBIAL BASE ROT PLAT SZ 5 KNEE (Knees) ×3 IMPLANT
TOWEL GREEN STERILE (TOWEL DISPOSABLE) ×3 IMPLANT
TOWEL GREEN STERILE FF (TOWEL DISPOSABLE) ×3 IMPLANT
TRAY CATH 16FR W/PLASTIC CATH (SET/KITS/TRAYS/PACK) IMPLANT

## 2019-07-13 NOTE — Transfer of Care (Signed)
Immediate Anesthesia Transfer of Care Note  Patient: Pam [name]  Procedure(s) Performed: LEFT TOTAL KNEE ARTHROPLASTY-CEMENTED (Left Knee)  Patient Location: PACU  Anesthesia Type:MAC and Spinal  Level of Consciousness: awake, alert  and oriented  Airway & Oxygen Therapy: Patient Spontanous Breathing  Post-op Assessment: Report given to RN and Post -op Vital signs reviewed and stable  Post vital signs: Reviewed and stable  Last Vitals:  Vitals Value Taken Time  BP 113/67 07/13/19 1426  Temp    Pulse 77 07/13/19 1427  Resp 21 07/13/19 1428  SpO2 99% 07/13/19 1427  Vitals shown include unvalidated device data.  Last Pain:  Vitals:   07/13/19 1055  PainSc: 7          Complications: No apparent anesthesia complications

## 2019-07-13 NOTE — Care Plan (Signed)
Ortho Bundle Case Management Note  Patient Details  Name: Pam [name] MRN: [id number] Date of Birth: 1941/05/01  [facility] pre-op call to patient to discuss Ortho bundle through [facility]. Reviewed all pre- and post-op questions related to her upcoming Left TKA with Dr. [name] on Monday, 07/13/19. Patient lives with her spouse and will have assistance by her husband and daughter. She has all DME needed (FWW, BSC, cane). Anticipate HHPT after short hospital stay. Choice provided. [facility]erral to [facility]. Will set up OPPT for a facility near her for around 2 weeks post-op. Will continue to follow for all CM needs.                       DME Arranged:  (No DME needed; patient states she has a FWW/cane/BSC) DME Agency:     HH Arranged:  PT [place]:  [facility])  Additional Comments: Please contact me with any questions of if this plan should need to change.  Jamse Arn, RN, BSN, [employer]  [phone] 07/13/2019, 5:05 PM

## 2019-07-13 NOTE — Op Note (Signed)
Preop diagnosis: Left knee primary osteoarthritis  Postop diagnosis: Same  Procedure: Left total knee arthroplasty.  Surgeon: Rodell Perna, MD  Assistant:James Ricard Dillon, PA-C medically necessary and present for the entire procedure  Tourniquet time: 350 less than 50 minutes  EBL 100 cc  ImplantsDepuy obtained size 4 femur size 5 tibia 5 mm rotating platform 35 mm 3 peg patella.  Cement HV smart set  Procedure: After preoperative vancomycin due to patient's allergies placement of a spinal with a preoperative abductor block proximal thigh tourniquet heel bump lateral post was applied.  Use extremity sheets and drapes were applied.  Sterile skin marker was used prior to Betadine Steri-Drape application.  Leg was wrapped with Esmarch tourniquet inflated midline incision was made medial retinaculum was marked with marker for later repair and quad tendon split between the medial one third lateral two thirds and medial parapatellar incision was made.  Tricompartmental degenerative changes with marginal osteophytes eburnated bone.  10 mm resected off the femur.  More bone was removed medial than lateral due to the patient's valgus deformity.  Tibial cut was made initially not enough to be with came back at 2 more meters of tibia in this lab to fit in with the knee fully extended.  Chamfer cuts box cuts made in the femur all excessive remnants of ACL PCL and meniscal remnants were removed.  Spurs removed off the femur and tibia.  Keel preparation for #5 baseplate which gave excellent full fit of the tibia.  Pulse lavage after trials with full extension good collateral balance.  Cement was placed tibia first followed by femur 5 mm rotating platform and then patella held with self-retaining clamps it was hard at 15 minutes Exparel and Marcaine 20+20 was infiltrated into the knee.  Tourniquet was deflated hemostasis obtained in standard layered closure skin staples on skin postop dressing knee immobilizer and  transferred recovery in stable condition.

## 2019-07-13 NOTE — Anesthesia Procedure Notes (Signed)
Spinal  Patient location during procedure: OR Start time: 07/13/2019 12:42 PM End time: 07/13/2019 12:46 PM Staffing Performed: anesthesiologist  Anesthesiologist: Audry Pili, MD Preanesthetic Checklist Completed: patient identified, IV checked, risks and benefits discussed, surgical consent, monitors and equipment checked, pre-op evaluation and timeout performed Spinal Block Patient position: sitting Prep: DuraPrep Patient monitoring: heart rate, cardiac monitor, continuous pulse ox and blood pressure Approach: midline Location: L3-4 Injection technique: single-shot Needle Needle type: Quincke  Needle gauge: 22 G Additional Notes Consent was obtained prior to the procedure with all questions answered and concerns addressed. Risks including, but not limited to, bleeding, infection, nerve damage, paralysis, failed block, inadequate analgesia, allergic reaction, high spinal, itching, and headache were discussed and the patient wished to proceed. Functioning IV was confirmed and monitors were applied. Sterile prep and drape, including hand hygiene, mask, and sterile gloves were used. The patient was positioned and the spine was prepped. The skin was anesthetized with lidocaine. Free flow of clear CSF was obtained prior to injecting local anesthetic into the CSF. The spinal needle aspirated freely following injection. The needle was carefully withdrawn. The patient tolerated the procedure well.   Renold Don, MD

## 2019-07-13 NOTE — H&P (Signed)
TOTAL KNEE ADMISSION H&P  Patient is being admitted for left total knee arthroplasty.  Subjective:  Chief Complaint:left knee pain.  HPI: Pam [name], 78 y.o. female, has a history of pain and functional disability in the left knee due to arthritis and has failed non-surgical conservative treatments for greater than 12 weeks to includeviscosupplementation injections, use of assistive devices and activity modification.  Onset of symptoms was gradual, starting 10 years ago with gradually worsening course since that time.   Patient currently rates pain in the left knee(s) at 10 out of 10 with activity. Patient has .  Patient has evidence of subchondral cysts, subchondral sclerosis, periarticular osteophytes and joint space narrowing by imaging studies.  There is no active infection.  Patient Active Problem List   Diagnosis Date Noted  . Persistent atrial fibrillation ([place]) 06/07/2019  . PAF (paroxysmal atrial fibrillation) ([place])   . Candidiasis, vagina 01/02/2019  . Hernia of abdominal cavity 10/07/2015  . Overflow incontinence of urine 07/04/2015  . Urinary retention with incomplete bladder emptying 05/09/2015  . Cervical cancer ([place]) 03/23/2015   Past Medical History:  Diagnosis Date  . Arthritis   . Atrial fibrillation ([place])   . Cancer [facility])    BREAST CANCER / CERVICAL CANCER   . Cervical cancer ([place])   . Dysrhythmia   . History of breast cancer 2006   Dr. Sonny Dandy oncologist - left breast  . History of gout   . Hyperlipidemia   . Urinary retention Sept 2016   s/p hysterectomy    Past Surgical History:  Procedure Laterality Date  . BREAST SURGERY    . CARDIOVERSION N/A 06/03/2019   Procedure: CARDIOVERSION;  Surgeon: Jerline Pain, MD;  Location: [facility] ENDOSCOPY;  Service: Cardiovascular;  Laterality: N/A;  . CATARACT EXTRACTION W/ INTRAOCULAR LENS  IMPLANT, BILATERAL    . EYE SURGERY    . INGUINAL HERNIA REPAIR Right 12/14/2015   Procedure: LAPAROSCOPIC AND OPEN RIGHT   INGUINAL HERNIA;  Surgeon: Johnathan Hausen, MD;  Location: [facility] ORS;  Service: General;  Laterality: Right;  . KNEE ARTHROSCOPY Left   . MASTECTOMY Left 2006  . MASTECTOMY WITH AXILLARY LYMPH NODE DISSECTION Left   . ROBOTIC ASSISTED TOTAL HYSTERECTOMY WITH BILATERAL SALPINGO OOPHERECTOMY Bilateral 04/07/2015   Procedure: ROBOTIC ASSISTED RADICAL HYSTERECTOMY BILATERAL SALPINGO OOPHORECTOMY BILATERAL SENTINEL LYMPHADENETOMY;  Surgeon: Everitt Amber, MD;  Location: [facility] ORS;  Service: Gynecology;  Laterality: Bilateral;  . TUBAL LIGATION      No current facility-administered medications for this encounter.   Current Outpatient Medications  Medication Sig Dispense Refill Last Dose  . aspirin EC 81 MG tablet Take 81 mg by mouth daily.     . Calcium Carb-Cholecalciferol (CALCIUM 600 + D PO) Take 1 tablet by mouth daily.      Marland Kitchen diltiazem (CARDIZEM CD) 360 MG 24 hr capsule Take 1 capsule (360 mg total) by mouth daily. 90 capsule 3   . fexofenadine (ALLEGRA) 180 MG tablet Take 180 mg by mouth daily.     Marland Kitchen gabapentin (NEURONTIN) 100 MG capsule Take 100 mg by mouth at bedtime.  1   . magnesium hydroxide (DULCOLAX) 400 MG/5ML suspension Take 15 mLs by mouth daily as needed for mild constipation.     Marland Kitchen rOPINIRole (REQUIP) 1 MG tablet Take 1 mg by mouth at bedtime.  0   . traMADol (ULTRAM) 50 MG tablet Take 50 mg by mouth 2 (two) times daily.     . valACYclovir (VALTREX) 1000 MG tablet Take 2,000  mg by mouth 2 (two) times daily as needed (cold sore).      Marland Kitchen acetaminophen-codeine (TYLENOL #3) 300-30 MG tablet Take 1 tablet by mouth every 6 (six) hours as needed for moderate pain. (Patient not taking: Reported on 07/06/2019) 30 tablet 0   . apixaban (ELIQUIS) 5 MG TABS tablet Take 5 mg by mouth 2 (two) times daily.      . Catheters MISC 1 Units by Does not apply route every 3 (three) hours as needed. 60 each 10   . diltiazem (CARDIZEM LA) 300 MG 24 hr tablet Take 1 tablet (300 mg total) by mouth daily. (Patient not  taking: Reported on 07/06/2019) 90 tablet 3 Not Taking at Unknown time  . metoprolol tartrate (LOPRESSOR) 25 MG tablet Take 1 tablet (25 mg total) by mouth 2 (two) times daily. 180 tablet 3   . sulfamethoxazole-trimethoprim (BACTRIM DS) 800-160 MG tablet Take 1 tablet by mouth 2 (two) times daily. 10 tablet 0   . UNABLE TO FIND In and out foley catheter 14 Fr. Q# 20   Refills: none Insert catheter every 2 hours to empty bladder. 1 each 1    Allergies  Allergen Reactions  . Aspirin Nausea And Vomiting  . Ciprofloxacin Diarrhea  . Flagyl [Metronidazole] Diarrhea  . Penicillins Hives and Other (See Comments)    Did it involve swelling of the face/tongue/throat, SOB, or low BP? No Did it involve sudden or severe rash/hives, skin peeling, or any reaction on the inside of your mouth or nose? Yes Did you need to seek medical attention at a hospital or doctor's office? Yes When did it last happen?15+ years If all above answers are "NO", may proceed with cephalosporin use.     Social History   Tobacco Use  . Smoking status: Former Smoker    Packs/day: 1.00    Years: 30.00    Pack years: 30.00    Types: Cigarettes    Quit date: 07/23/1994    Years since quitting: 24.9  . Smokeless tobacco: Never Used  Substance Use Topics  . Alcohol use: Never    Alcohol/week: 0.0 standard drinks    Family History  Problem Relation Age of Onset  . Prostate cancer Father   . Heart failure Father        70s  . Heart failure Mother        68  . Breast cancer Sister      Review of Systems  Constitutional: Negative.   HENT: Negative.   Respiratory: Negative.   Cardiovascular: Positive for palpitations.  Gastrointestinal: Positive for blood in stool.  Genitourinary: Negative.   Musculoskeletal: Positive for gait problem and joint swelling.  Psychiatric/Behavioral: Negative.     Objective:  Physical Exam  Constitutional: She is oriented to person, place, and time. She appears distressed.   HENT:  Head: Normocephalic and atraumatic.  Eyes: Pupils are equal, round, and reactive to light. EOM are normal.  Cardiovascular:  irregular  Respiratory: Effort normal. No respiratory distress.  GI: Soft. She exhibits no distension. There is no abdominal tenderness.  Musculoskeletal:        General: Tenderness present.     Cervical back: Normal range of motion.  Neurological: She is alert and oriented to person, place, and time.  Skin: Skin is warm and dry.  Psychiatric: She has a normal mood and affect.    Vital signs in last 24 hours:    Labs:   Estimated body mass index is 23.83 kg/m as  calculated from the following:   Height as of 07/02/19: 5\' 3"  (1.6 m).   Weight as of 07/02/19: 61 kg.   Imaging Review Plain radiographs demonstrate moderate degenerative joint disease of the left knee(s). The overall alignment ismild varus. The bone quality appears to be good for age and reported activity level.      Assessment/Plan:  End stage arthritis, left knee   The patient history, physical examination, clinical judgment of the provider and imaging studies are consistent with end stage degenerative joint disease of the left knee(s) and total knee arthroplasty is deemed medically necessary. The treatment options including medical management, injection therapy arthroscopy and arthroplasty were discussed at length. The risks and benefits of total knee arthroplasty were presented and reviewed. The risks due to aseptic loosening, infection, stiffness, patella tracking problems, thromboembolic complications and other imponderables were discussed. The patient acknowledged the explanation, agreed to proceed with the plan and consent was signed. Patient is being admitted for inpatient treatment for surgery, pain control, PT, OT, prophylactic antibiotics, VTE prophylaxis, progressive ambulation and ADL's and discharge planning. The patient is planning to be discharged home with home health  services After patient was seen by me in the clinic I had advised her to contact her cardiologist to discuss palpitations that she had been having.     Anticipated LOS equal to or greater than 2 midnights due to - Age 84 and older with one or more of the following:  - Obesity  - Expected need for hospital services (PT, OT, Nursing) required for safe  discharge  - Anticipated need for postoperative skilled nursing care or inpatient rehab  - Active co-morbidities: Cardiac Arrhythmia OR   - Unanticipated findings during/Post Surgery: None  - Patient is a high risk of re-admission due to: None

## 2019-07-13 NOTE — Progress Notes (Signed)
Orthopedic Tech Progress Note Patient Details:  Tamala Pella [facility] 04-06-1941 [id number]  CPM Left Knee CPM Left Knee: On Left Knee Flexion (Degrees): 90 Left Knee Extension (Degrees): 0 Additional Comments: trapeze bar and foot roll  Post Interventions Patient Tolerated: Well Instructions Provided: Care of device  [place] Pink 07/13/2019, 3:10 PM

## 2019-07-13 NOTE — Interval H&P Note (Signed)
History and Physical Interval Note:  07/13/2019 12:25 PM  [name]  has presented today for surgery, with the diagnosis of left knee primary osteoarthritis.  The various methods of treatment have been discussed with the patient and family. After consideration of risks, benefits and other options for treatment, the patient has consented to  Procedure(s): LEFT TOTAL KNEE ARTHROPLASTY-CEMENTED (Left) as a surgical intervention.  The patient's history has been reviewed, patient examined, no change in status, stable for surgery.  I have reviewed the patient's chart and labs.  Questions were answered to the patient's satisfaction.     Marybelle Killings

## 2019-07-13 NOTE — Evaluation (Signed)
Physical Therapy Evaluation Patient Details Name: Pam [name] MRN: [id number] DOB: [birth year] Today's Date: 07/13/2019   History of Present Illness  Patient is a 78 y/o female admitted for L TKA.  PMH positive for cervical and breast CA and urinary retention (self caths at home).  Clinical Impression  Patient presents with decreased mobility due to pain, decreased AROM/strength L LE.  Currently min A overall and has assist at home.  Feel she will benefit from skilled PT in the acute setting and follow up PT as indicated per MD.      Follow Up Recommendations Follow surgeon's recommendation for DC plan and follow-up therapies    Equipment Recommendations  None recommended by PT    Recommendations for Other Services       Precautions / Restrictions Precautions Precautions: Fall;Knee Required Braces or Orthoses: Knee Immobilizer - Left Restrictions Other Position/Activity Restrictions: WBAT      Mobility  Bed Mobility Overal bed mobility: Needs Assistance Bed Mobility: Supine to Sit;Sit to Supine     Supine to sit: Min assist;HOB elevated Sit to supine: Min assist   General bed mobility comments: assist for L LE  Transfers Overall transfer level: Needs assistance Equipment used: Rolling walker (2 wheeled) Transfers: Sit to/from Stand Sit to Stand: Min assist;Mod assist         General transfer comment: up from EOB min A for balance/safety; from recliner heavy mod A lifting help (daughter commented on lift chair at home); assisted to bed per RN for I/O cath  Ambulation/Gait Ambulation/Gait assistance: Min assist Gait Distance (Feet): 40 Feet Assistive device: Rolling walker (2 wheeled) Gait Pattern/deviations: Step-to pattern;Decreased stride length;Antalgic     General Gait Details: fatigued with ambulation, heavy UE use with L LE weight bearing and UE's fatigued  Stairs            Wheelchair Mobility    Modified Rankin (Stroke Patients Only)        Balance Overall balance assessment: Needs assistance   Sitting balance-Leahy Scale: Good     Standing balance support: Bilateral upper extremity supported Standing balance-Leahy Scale: Poor Standing balance comment: UE support for balance                             Pertinent Vitals/Pain Pain Assessment: 0-10 Pain Score: 8  Pain Location: L knee in CPM Pain Descriptors / Indicators: Discomfort;Aching Pain Intervention(s): Monitored during session;Repositioned;Ice applied;Patient requesting pain meds-RN notified    Home Living Family/patient expects to be discharged to:: Private residence Living Arrangements: Spouse/significant other Available Help at Discharge: Family Type of Home: House Home Access: Stairs to enter Entrance Stairs-Rails: Can reach both Entrance Stairs-Number of Steps: 4 Home Layout: One level Home Equipment: [place] - 2 wheels;Bedside commode;Cane - single point      Prior Function Level of Independence: Independent with assistive device(s)         Comments: uses walker at times     Hand Dominance        Extremity/Trunk Assessment   Upper Extremity Assessment Upper Extremity Assessment: Overall WFL for tasks assessed(arthritic deformities in hands, but functional use)    Lower Extremity Assessment Lower Extremity Assessment: LLE deficits/detail;RLE deficits/detail RLE Deficits / Details: AROM WFL, audible creiptus from R knee upon rising from bed LLE Deficits / Details: AAROM grossly 40 degrees flexion, -15 degrees extension, able to lift leg unaided       Communication   Communication: No difficulties  Cognition Arousal/Alertness: Awake/alert Behavior During Therapy: WFL for tasks assessed/performed Overall Cognitive Status: Within Functional Limits for tasks assessed                                        General Comments General comments (skin integrity, edema, etc.): daughter in the room and  supportive    Exercises Total Joint Exercises Ankle Circles/Pumps: AROM;10 reps;Supine;Both Quad Sets: AROM;5 reps;Supine;Both Heel Slides: AAROM;5 reps;Supine;Left Hip ABduction/ADduction: AAROM;5 reps;Left;Supine Straight Leg Raises: AROM;5 reps;Left;Supine   Assessment/Plan    PT Assessment Patient needs continued PT services  PT Problem List Decreased strength;Decreased range of motion;Decreased knowledge of use of DME;Decreased mobility;Decreased activity tolerance;Decreased balance;Pain       PT Treatment Interventions DME instruction;Stair training;Therapeutic activities;Balance training;Therapeutic exercise;Functional mobility training;Gait training;Patient/family education    PT Goals (Current goals can be found in the Care Plan section)  Acute Rehab PT Goals Patient Stated Goal: to go home PT Goal Formulation: With patient/family Time For Goal Achievement: 07/20/19 Potential to Achieve Goals: Good    Frequency 7X/week   Barriers to discharge        Co-evaluation               AM-PAC PT "6 Clicks" Mobility  Outcome Measure Help needed turning from your back to your side while in a flat bed without using bedrails?: A Little Help needed moving from lying on your back to sitting on the side of a flat bed without using bedrails?: A Little Help needed moving to and from a bed to a chair (including a wheelchair)?: A Little Help needed standing up from a chair using your arms (e.g., wheelchair or bedside chair)?: A Lot Help needed to walk in hospital room?: A Little Help needed climbing 3-5 steps with a railing? : A Little 6 Click Score: 17    End of Session Equipment Utilized During Treatment: Left knee immobilizer Activity Tolerance: Patient tolerated treatment well Patient left: in bed;with call bell/phone within reach;with family/visitor present;with nursing/sitter in room   PT Visit Diagnosis: Difficulty in walking, not elsewhere classified  (R26.2);Pain Pain - Right/Left: Left Pain - part of body: Knee    Time: 1700-1730 PT Time Calculation (min) (ACUTE ONLY): 30 min   Charges:   PT Evaluation $PT Eval Low Complexity: 1 Low PT Treatments $Gait Training: 8-22 mins        Magda Kiel, [place] [facility] [phone] 07/13/2019   Pam [name] 07/13/2019, 5:52 PM

## 2019-07-13 NOTE — Anesthesia Procedure Notes (Signed)
Anesthesia Regional Block: Adductor canal block   Pre-Anesthetic Checklist: ,, timeout performed, Correct Patient, Correct Site, Correct Laterality, Correct Procedure, Correct Position, site marked, Risks and benefits discussed,  Surgical consent,  Pre-op evaluation,  At surgeon's request and post-op pain management  Laterality: Left  Prep: chloraprep       Needles:  Injection technique: Single-shot  Needle Type: Echogenic Needle     Needle Length: 10cm  Needle Gauge: 21     Additional Needles:   Narrative:  Start time: 07/13/2019 12:12 PM End time: 07/13/2019 12:16 PM Injection made incrementally with aspirations every 5 mL.  Performed by: Personally  Anesthesiologist: Audry Pili, MD  Additional Notes: No pain on injection. No increased resistance to injection. Injection made in 5cc increments. Good needle visualization. Patient tolerated the procedure well.

## 2019-07-14 ENCOUNTER — Telehealth: Payer: Self-pay | Admitting: Orthopaedic Surgery

## 2019-07-14 ENCOUNTER — Encounter: Payer: Self-pay | Admitting: *Deleted

## 2019-07-14 DIAGNOSIS — E785 Hyperlipidemia, unspecified: Secondary | ICD-10-CM | POA: Diagnosis not present

## 2019-07-14 DIAGNOSIS — Z7982 Long term (current) use of aspirin: Secondary | ICD-10-CM | POA: Diagnosis not present

## 2019-07-14 DIAGNOSIS — Z79899 Other long term (current) drug therapy: Secondary | ICD-10-CM | POA: Diagnosis not present

## 2019-07-14 DIAGNOSIS — I480 Paroxysmal atrial fibrillation: Secondary | ICD-10-CM | POA: Diagnosis not present

## 2019-07-14 DIAGNOSIS — I4819 Other persistent atrial fibrillation: Secondary | ICD-10-CM | POA: Diagnosis not present

## 2019-07-14 DIAGNOSIS — M1712 Unilateral primary osteoarthritis, left knee: Secondary | ICD-10-CM | POA: Diagnosis not present

## 2019-07-14 LAB — BASIC METABOLIC PANEL
Anion gap: 8 (ref 5–15)
BUN: 6 mg/dL — ABNORMAL LOW (ref 8–23)
CO2: 24 mmol/L (ref 22–32)
Calcium: 8.3 mg/dL — ABNORMAL LOW (ref 8.9–10.3)
Chloride: 99 mmol/L (ref 98–111)
Creatinine, Ser: 0.61 mg/dL (ref 0.44–1.00)
GFR calc Af Amer: >60 mL/min (ref >60)
GFR calc non Af Amer: >60 mL/min (ref >60)
Glucose, Bld: 167 mg/dL — ABNORMAL HIGH (ref 70–99)
Potassium: 4.2 mmol/L (ref 3.5–5.1)
Sodium: 131 mmol/L — ABNORMAL LOW (ref 135–145)

## 2019-07-14 LAB — CBC
HCT: 30.9 % — ABNORMAL LOW (ref 36.0–46.0)
Hemoglobin: 10.3 g/dL — ABNORMAL LOW (ref 12.0–15.0)
MCH: 29.5 pg (ref 26.0–34.0)
MCHC: 33.3 g/dL (ref 30.0–36.0)
MCV: 88.5 fL (ref 80.0–100.0)
Platelets: 299 10*3/uL (ref 150–400)
RBC: 3.49 MIL/uL — ABNORMAL LOW (ref 3.87–5.11)
RDW: 13.2 % (ref 11.5–15.5)
WBC: 8.4 10*3/uL (ref 4.0–10.5)
nRBC: 0 % (ref 0.0–0.2)

## 2019-07-14 MED ORDER — OXYCODONE-ACETAMINOPHEN 5-325 MG PO TABS: 1.0000 | ORAL_TABLET | Freq: Four times a day (QID) | ORAL | 0 refills | Status: DC | PRN

## 2019-07-14 MED ORDER — METHOCARBAMOL 500 MG PO TABS: 500.0000 mg | ORAL_TABLET | Freq: Three times a day (TID) | ORAL | 1 refills | Status: DC | PRN

## 2019-07-14 MED ORDER — METHOCARBAMOL 500 MG PO TABS: 500.0000 mg | ORAL_TABLET | Freq: Three times a day (TID) | ORAL | 0 refills | Status: DC | PRN

## 2019-07-14 MED ORDER — WHITE PETROLATUM EX OINT
TOPICAL_OINTMENT | CUTANEOUS | Status: AC
  Filled 2019-07-14: qty 28.35

## 2019-07-14 NOTE — Discharge Instructions (Addendum)
INSTRUCTIONS AFTER JOINT REPLACEMENT   o Remove items at home which could result in a fall. This includes throw rugs or furniture in walking pathways o ICE to the affected joint every three hours while awake for 30 minutes at a time, for at least the first 3-5 days, and then as needed for pain and swelling.  Continue to use ice for pain and swelling. You may notice swelling that will progress down to the foot and ankle.  This is normal after surgery.  Elevate your leg when you are not up walking on it.   o Continue to use the breathing machine you got in the hospital (incentive spirometer) which will help keep your temperature down.  It is common for your temperature to cycle up and down following surgery, especially at night when you are not up moving around and exerting yourself.  The breathing machine keeps your lungs expanded and your temperature down.   DIET:  As you were doing prior to hospitalization, we recommend a well-balanced diet.  DRESSING / WOUND CARE / SHOWERING  Keep the surgical dressing until follow up.  The dressing is water proof, so you can shower without any extra covering.  IF THE DRESSING FALLS OFF or the wound gets wet inside, change the dressing with sterile gauze.  Please use good hand washing techniques before changing the dressing.  Do not use any lotions or creams on the incision until instructed by your surgeon.    ACTIVITY  o Increase activity slowly as tolerated, but follow the weight bearing instructions below.   o No driving for 6 weeks or until further direction given by your physician.  You cannot drive while taking narcotics.  o No lifting or carrying greater than 10 lbs. until further directed by your surgeon. o Avoid periods of inactivity such as sitting longer than an hour when not asleep. This helps prevent blood clots.  o You may return to work once you are authorized by your doctor.     WEIGHT BEARING   Weight bearing as tolerated with assist  device (walker, cane, etc) as directed, use it as long as suggested by your surgeon or therapist, typically at least 4-6 weeks.   EXERCISES  Results after joint replacement surgery are often greatly improved when you follow the exercise, range of motion and muscle strengthening exercises prescribed by your doctor. Safety measures are also important to protect the joint from further injury. Any time any of these exercises cause you to have increased pain or swelling, decrease what you are doing until you are comfortable again and then slowly increase them. If you have problems or questions, call your caregiver or physical therapist for advice.   Rehabilitation is important following a joint replacement. After just a few days of immobilization, the muscles of the leg can become weakened and shrink (atrophy).  These exercises are designed to build up the tone and strength of the thigh and leg muscles and to improve motion. Often times heat used for twenty to thirty minutes before working out will loosen up your tissues and help with improving the range of motion but do not use heat for the first two weeks following surgery (sometimes heat can increase post-operative swelling).   These exercises can be done on a training (exercise) mat, on the floor, on a table or on a bed. Use whatever works the best and is most comfortable for you.    Use music or television while you are exercising so that   the exercises are a pleasant break in your day. This will make your life better with the exercises acting as a break in your routine that you can look forward to.   Perform all exercises about fifteen times, three times per day or as directed.  You should exercise both the operative leg and the other leg as well.  Exercises include:   . Quad Sets - Tighten up the muscle on the front of the thigh (Quad) and hold for 5-10 seconds.   . Straight Leg Raises - With your knee straight (if you were given a brace, keep it on),  lift the leg to 60 degrees, hold for 3 seconds, and slowly lower the leg.  Perform this exercise against resistance later as your leg gets stronger.  . Leg Slides: Lying on your back, slowly slide your foot toward your buttocks, bending your knee up off the floor (only go as far as is comfortable). Then slowly slide your foot back down until your leg is flat on the floor again.  . Angel Wings: Lying on your back spread your legs to the side as far apart as you can without causing discomfort.  . Hamstring Strength:  Lying on your back, push your heel against the floor with your leg straight by tightening up the muscles of your buttocks.  Repeat, but this time bend your knee to a comfortable angle, and push your heel against the floor.  You may put a pillow under the heel to make it more comfortable if necessary.   A rehabilitation program following joint replacement surgery can speed recovery and prevent re-injury in the future due to weakened muscles. Contact your doctor or a physical therapist for more information on knee rehabilitation.    CONSTIPATION  Constipation is defined medically as fewer than three stools per week and severe constipation as less than one stool per week.  Even if you have a regular bowel pattern at home, your normal regimen is likely to be disrupted due to multiple reasons following surgery.  Combination of anesthesia, postoperative narcotics, change in appetite and fluid intake all can affect your bowels.   YOU MUST use at least one of the following options; they are listed in order of increasing strength to get the job done.  They are all available over the counter, and you may need to use some, POSSIBLY even all of these options:    Drink plenty of fluids (prune juice may be helpful) and high fiber foods Colace 100 mg by mouth twice a day  Senokot for constipation as directed and as needed Dulcolax (bisacodyl), take with full glass of water  Miralax (polyethylene glycol)  once or twice a day as needed.  If you have tried all these things and are unable to have a bowel movement in the first 3-4 days after surgery call either your surgeon or your primary doctor.    If you experience loose stools or diarrhea, hold the medications until you stool forms back up.  If your symptoms do not get better within 1 week or if they get worse, check with your doctor.  If you experience "the worst abdominal pain ever" or develop nausea or vomiting, please contact the office immediately for further recommendations for treatment.   ITCHING:  If you experience itching with your medications, try taking only a single pain pill, or even half a pain pill at a time.  You can also use Benadryl over the counter for itching or also to   help with sleep.   TED HOSE STOCKINGS:  Use stockings on both legs until for at least 2 weeks or as directed by physician office. They may be removed at night for sleeping.  MEDICATIONS:  See your medication summary on the "After Visit Summary" that nursing will review with you.  You may have some home medications which will be placed on hold until you complete the course of blood thinner medication.  It is important for you to complete the blood thinner medication as prescribed.  PRECAUTIONS:  If you experience chest pain or shortness of breath - call 911 immediately for transfer to the hospital emergency department.   If you develop a fever greater that 101 F, purulent drainage from wound, increased redness or drainage from wound, foul odor from the wound/dressing, or calf pain - CONTACT YOUR SURGEON.                                                   FOLLOW-UP APPOINTMENTS:  If you do not already have a post-op appointment, please call the office for an appointment to be seen by your surgeon.  Guidelines for how soon to be seen are listed in your "After Visit Summary", but are typically between 1-4 weeks after surgery.  OTHER INSTRUCTIONS:   Knee  Replacement:  Do not place pillow under knee, focus on keeping the knee straight while resting. CPM instructions: 0-90 degrees, 2 hours in the morning, 2 hours in the afternoon, and 2 hours in the evening. Place foam block, curve side up under heel at all times except when in CPM or when walking.  DO NOT modify, tear, cut, or change the foam block in any way.  MAKE SURE YOU:  . Understand these instructions.  . Get help right away if you are not doing well or get worse.    Thank you for letting us be a part of your medical care team.  It is a privilege we respect greatly.  We hope these instructions will help you stay on track for a fast and full recovery!   Eliquis restart Wednesday take as you did prior to surgery.  See Dr. Freeman Caldron Years Eve in [place] clinic . Office phone [phone]

## 2019-07-14 NOTE — Evaluation (Signed)
Occupational Therapy Evaluation Patient Details Name: Pam [name] MRN: [id number] DOB: [birth year] Today's Date: 07/14/2019    History of Present Illness   Comments Patient is a 78 y/o female admitted for L TKA.  PMH positive for cervical and breast CA and urinary retention (self caths at home).   Pt is at mod A level with LB ADLs and mobility using RW. Pt has a 3 in 1 at home and will have assist at home from her family as needed. Pt was independent with ADLs/selfcvare PTA. Pt to discharge home this afternoon. All education completed and no further acute OT is indicated at this time.    Follow Up Recommendations  Home health OT    Equipment Recommendations  Other (comment)(reacher)    Recommendations for Other Services       Precautions / Restrictions Precautions Precautions: Fall;Knee Required Braces or Orthoses: Knee Immobilizer - Left Knee Immobilizer - Left: On when out of bed or walking Restrictions Weight Bearing Restrictions: No LLE Weight Bearing: Weight bearing as tolerated Other Position/Activity Restrictions: WBAT      Mobility Bed Mobility Overal bed mobility: Needs Assistance Bed Mobility: Supine to Sit;Sit to Supine     Supine to sit: Min assist;HOB elevated     General bed mobility comments: pt in recliner  Transfers Overall transfer level: Needs assistance Equipment used: Rolling walker (2 wheeled) Transfers: Sit to/from Stand Sit to Stand: Mod assist;From elevated surface         General transfer comment: heavy mod A lifting help from recliner and 3 in 1    Balance Overall balance assessment: Needs assistance Sitting-balance support: No upper extremity supported;Feet supported Sitting balance-Leahy Scale: Good     Standing balance support: Bilateral upper extremity supported Standing balance-Leahy Scale: Poor Standing balance comment: UE support for balance                           ADL either performed or assessed  with clinical judgement   ADL Overall ADL's : Needs assistance/impaired Eating/Feeding: Independent;Sitting   Grooming: Wash/dry hands;Wash/dry face;Min guard;Standing   Upper Body Bathing: Set up;Independent;Sitting   Lower Body Bathing: Moderate assistance   Upper Body Dressing : Set up;Independent;Sitting   Lower Body Dressing: Moderate assistance   Toilet Transfer: Moderate assistance;Minimal assistance;Ambulation;RW;Comfort height toilet;Grab bars;Cueing for safety   Toileting- Clothing Manipulation and Hygiene: Minimal assistance;Sit to/from stand   Tub/ Banker: Moderate assistance;Minimal assistance;Ambulation;Rolling walker;3 in 1;Cueing for safety   Functional mobility during ADLs: Moderate assistance;Minimal assistance;Rolling walker;Cueing for safety       Vision Baseline Vision/History: Wears glasses Wears Glasses: Reading only Patient Visual Report: No change from baseline       Perception     Praxis      Pertinent Vitals/Pain Pain Assessment: 0-10 Pain Score: 7  Faces Pain Scale: Hurts whole lot Pain Location: L knee in CPM Pain Descriptors / Indicators: Discomfort;Aching Pain Intervention(s): Monitored during session;Repositioned;Limited activity within patient's tolerance     Hand Dominance Right   Extremity/Trunk Assessment Upper Extremity Assessment Upper Extremity Assessment: Overall WFL for tasks assessed   Lower Extremity Assessment Lower Extremity Assessment: Defer to PT evaluation   Cervical / Trunk Assessment Cervical / Trunk Assessment: Normal   Communication Communication Communication: No difficulties   Cognition Arousal/Alertness: Awake/alert Behavior During Therapy: WFL for tasks assessed/performed Overall Cognitive Status: Within Functional Limits for tasks assessed  General Comments       Exercises Exercises: Total Joint Total Joint Exercises Ankle  Circles/Pumps: AROM;10 reps;Supine;Both Quad Sets: AROM;5 reps;Supine;Both Heel Slides: AAROM;Supine;Left;10 reps Hip ABduction/ADduction: AAROM;5 reps;Left;Supine Straight Leg Raises: AROM;Left;Supine;10 reps Goniometric ROM: 10-52 degrees   Shoulder Instructions      Home Living Family/patient expects to be discharged to:: Private residence Living Arrangements: Spouse/significant other Available Help at Discharge: Family Type of Home: House Home Access: Stairs to enter Technical brewer of Steps: 4 Entrance Stairs-Rails: Can reach both [place]: One level     Bathroom Shower/Tub: Teacher, early years/pre: [place]: Environmental consultant - 2 wheels;Bedside commode;Cane - single point          Prior Functioning/Environment Level of Independence: Independent with assistive device(s)        Comments: uses walker at times        OT Problem List: Impaired balance (sitting and/or standing);Pain;Decreased activity tolerance;Decreased knowledge of use of DME or AE      OT Treatment/Interventions:      OT Goals(Current goals can be found in the care plan section) Acute Rehab OT Goals Patient Stated Goal: go home OT Goal Formulation: With patient  OT Frequency:     Barriers to D/C:            Co-evaluation              AM-PAC OT "6 Clicks" Daily Activity     Outcome Measure Help from another person eating meals?: None Help from another person taking care of personal grooming?: A Little Help from another person toileting, which includes using toliet, bedpan, or urinal?: A Little Help from another person bathing (including washing, rinsing, drying)?: A Lot Help from another person to put on and taking off regular upper body clothing?: None Help from another person to put on and taking off regular lower body clothing?: A Lot 6 Click Score: 18   End of Session Equipment Utilized During Treatment: Gait belt;Other (comment);Rolling  walker(3 in 1) CPM Left Knee CPM Left Knee: Off Additional Comments: trapeze bar and foot roll  Activity Tolerance: Patient tolerated treatment well Patient left: in chair;with call bell/phone within reach  OT Visit Diagnosis: Unsteadiness on feet (R26.81);Other abnormalities of gait and mobility (R26.89);Pain Pain - Right/Left: Left Pain - part of body: Knee                Time: [id number] OT Time Calculation (min): 24 min Charges:  OT General Charges $OT Visit: 1 Visit OT Evaluation $OT Eval Low Complexity: 1 Low OT Treatments $Self Care/Home Management : 8-22 mins    Britt Bottom 07/14/2019, 11:35 AM

## 2019-07-14 NOTE — Telephone Encounter (Signed)
Please see below and advise.

## 2019-07-14 NOTE — Anesthesia Postprocedure Evaluation (Signed)
Anesthesia Post Note  Patient: Pam [name]  Procedure(s) Performed: LEFT TOTAL KNEE ARTHROPLASTY-CEMENTED (Left Knee)     Patient location during evaluation: PACU Anesthesia Type: Spinal Level of consciousness: awake and alert Pain management: pain level controlled Vital Signs Assessment: post-procedure vital signs reviewed and stable Respiratory status: spontaneous breathing and respiratory function stable Cardiovascular status: blood pressure returned to baseline and stable Postop Assessment: spinal receding and no apparent nausea or vomiting Anesthetic complications: no    Last Vitals:  Vitals:   07/14/19 0359 07/14/19 0652  BP: 108/67 131/73  Pulse: 62 (!) 109  Resp:  18  Temp: 36.8 C   SpO2: 99% 95%    Last Pain:  Vitals:   07/14/19 0359  TempSrc: Oral  PainSc:    Pain Goal: Patients Stated Pain Goal: 3 (07/14/19 0133)                 Audry Pili

## 2019-07-14 NOTE — Plan of Care (Signed)
Problem: Education: Goal: Knowledge of General Education information will improve Description: Including pain rating scale, medication(s)/side effects and non-pharmacologic comfort measures 07/14/2019 1025 by Erling Conte, RN Outcome: Adequate for Discharge 07/14/2019 [phone] by Erling Conte, RN Outcome: Progressing   Problem: Health Behavior/Discharge Planning: Goal: Ability to manage health-related needs will improve 07/14/2019 1025 by Erling Conte, RN Outcome: Adequate for Discharge 07/14/2019 [id number] by Erling Conte, RN Outcome: Progressing   Problem: Clinical Measurements: Goal: Ability to maintain clinical measurements within normal limits will improve 07/14/2019 1025 by Erling Conte, RN Outcome: Adequate for Discharge 07/14/2019 [id number] by Erling Conte, RN Outcome: Progressing Goal: Will remain free from infection 07/14/2019 1025 by Erling Conte, RN Outcome: Adequate for Discharge 07/14/2019 [phone] by Erling Conte, RN Outcome: Progressing Goal: Diagnostic test results will improve 07/14/2019 1025 by Erling Conte, RN Outcome: Adequate for Discharge 07/14/2019 [id number] by Erling Conte, RN Outcome: Progressing Goal: Respiratory complications will improve 07/14/2019 1025 by Erling Conte, RN Outcome: Adequate for Discharge 07/14/2019 [phone] by Erling Conte, RN Outcome: Progressing Goal: Cardiovascular complication will be avoided 07/14/2019 1025 by Erling Conte, RN Outcome: Adequate for Discharge 07/14/2019 [phone] by Erling Conte, RN Outcome: Progressing   Problem: Activity: Goal: Risk for activity intolerance will decrease 07/14/2019 1025 by Erling Conte, RN Outcome: Adequate for Discharge 07/14/2019 [phone] by Erling Conte, RN Outcome: Progressing   Problem: Nutrition: Goal: Adequate nutrition will be maintained 07/14/2019 1025 by Erling Conte, RN Outcome: Adequate for  Discharge 07/14/2019 [phone] by Erling Conte, RN Outcome: Progressing   Problem: Coping: Goal: Level of anxiety will decrease 07/14/2019 1025 by Erling Conte, RN Outcome: Adequate for Discharge 07/14/2019 [phone] by Erling Conte, RN Outcome: Progressing   Problem: Elimination: Goal: Will not experience complications related to bowel motility 07/14/2019 1025 by Erling Conte, RN Outcome: Adequate for Discharge 07/14/2019 [phone] by Erling Conte, RN Outcome: Progressing Goal: Will not experience complications related to urinary retention 07/14/2019 1025 by Erling Conte, RN Outcome: Adequate for Discharge 07/14/2019 [phone] by Erling Conte, RN Outcome: Progressing   Problem: Pain Managment: Goal: General experience of comfort will improve 07/14/2019 1025 by Erling Conte, RN Outcome: Adequate for Discharge 07/14/2019 [phone] by Erling Conte, RN Outcome: Progressing   Problem: Safety: Goal: Ability to remain free from injury will improve 07/14/2019 1025 by Erling Conte, RN Outcome: Adequate for Discharge 07/14/2019 [phone] by Erling Conte, RN Outcome: Progressing   Problem: Skin Integrity: Goal: Risk for impaired skin integrity will decrease 07/14/2019 1025 by Erling Conte, RN Outcome: Adequate for Discharge 07/14/2019 [phone] by Erling Conte, RN Outcome: Progressing   Problem: Education: Goal: Knowledge of the prescribed therapeutic regimen will improve 07/14/2019 1025 by Erling Conte, RN Outcome: Adequate for Discharge 07/14/2019 [id number] by Erling Conte, RN Outcome: Progressing Goal: Individualized Educational Video(s) 07/14/2019 1025 by Erling Conte, RN Outcome: Adequate for Discharge 07/14/2019 [phone] by Erling Conte, RN Outcome: Progressing   Problem: Activity: Goal: Ability to avoid complications of mobility impairment will improve 07/14/2019 1025 by Erling Conte, RN Outcome:  Adequate for Discharge 07/14/2019 [phone] by Erling Conte, RN Outcome: Progressing Goal: Range of joint motion will improve 07/14/2019 1025 by Erling Conte, RN Outcome: Adequate for Discharge 07/14/2019 [id number] by Erling Conte, RN Outcome: Progressing   Problem: Clinical Measurements: Goal: Postoperative complications will be avoided or minimized 07/14/2019 1025  by Erling Conte, RN Outcome: Adequate for Discharge 07/14/2019 [phone] by Erling Conte, RN Outcome: Progressing   Problem: Pain Management: Goal: Pain level will decrease with appropriate interventions 07/14/2019 1025 by Erling Conte, RN Outcome: Adequate for Discharge 07/14/2019 [id number] by Erling Conte, RN Outcome: Progressing   Problem: Skin Integrity: Goal: Will show signs of wound healing 07/14/2019 1025 by Erling Conte, RN Outcome: Adequate for Discharge 07/14/2019 [phone] by Erling Conte, RN Outcome: Progressing

## 2019-07-14 NOTE — Progress Notes (Signed)
Physical Therapy Treatment Patient Details Name: Pam [name] MRN: [id number] DOB: October 25, 1940 Today's Date: 07/14/2019    History of Present Illness Patient is a 78 y/o female admitted for L TKA.  PMH positive for cervical and breast CA and urinary retention (self caths at home).    PT Comments    Pt admitted with above diagnosis. Pt was able to ambulate with RW with min assist and cues with forward flexed posture. Cues for sequencing steps and RW.  Pt had difficulty with up and down steps but did find the way that works best for pt is up with 2 rails forward and down with 1 rail sideways.  Pt in agreement.  PRogressing with exercise as well.  Continue PT.   Pt currently with functional limitations due to the deficits listed below (see PT Problem List). Pt will benefit from skilled PT to increase their independence and safety with mobility to allow discharge to the venue listed below.     Follow Up Recommendations  Follow surgeon's recommendation for DC plan and follow-up therapies     Equipment Recommendations  None recommended by PT    Recommendations for Other Services       Precautions / Restrictions Precautions Precautions: Fall;Knee Required Braces or Orthoses: Knee Immobilizer - Left Restrictions Weight Bearing Restrictions: No LLE Weight Bearing: Weight bearing as tolerated Other Position/Activity Restrictions: WBAT    Mobility  Bed Mobility Overal bed mobility: Needs Assistance Bed Mobility: Supine to Sit;Sit to Supine     Supine to sit: Min assist;HOB elevated     General bed mobility comments: assist for L LE to get it started moving  Transfers Overall transfer level: Needs assistance Equipment used: Rolling walker (2 wheeled) Transfers: Sit to/from Stand Sit to Stand: Mod assist;From elevated surface         General transfer comment: up from EOB mod A for balance/safety; from recliner heavy mod A lifting help  Ambulation/Gait Ambulation/Gait  assistance: Min assist;Min guard Gait Distance (Feet): 30 Feet(10 feet x 3) Assistive device: Rolling walker (2 wheeled) Gait Pattern/deviations: Step-to pattern;Decreased stride length;Antalgic;Trunk flexed;Wide base of support   Gait velocity interpretation: <1.31 ft/sec, indicative of household ambulator General Gait Details: fatigued with ambulation, heavy UE use with L LE weight bearing and UE's fatigued   Stairs Stairs: Yes Stairs assistance: Mod assist;Min assist;+2 safety/equipment Stair Management: Two rails;One rail Left;Step to pattern;Forwards;Sideways Number of Stairs: 2 General stair comments: Pt had a lot of difficulty with steps.  Showed pt two ways of forwards with rail and backwards with RW.  Pt was scared to go up forwards and so got ready to go backwards and pt requesting to sit and rest.  Pt rested and then wanted to try forward with 2 rails again.  Pt finally was able to go up 2 steps with mod asssist and cues.  Pt then turned around to come down but was fearful to come down forward facing therefore had pt use right rail (going down)  and come down sideways leading with left LE and pt was able to descend steps this technique.    Wheelchair Mobility    Modified Rankin (Stroke Patients Only)       Balance Overall balance assessment: Needs assistance Sitting-balance support: No upper extremity supported;Feet supported Sitting balance-Leahy Scale: Good     Standing balance support: Bilateral upper extremity supported Standing balance-Leahy Scale: Poor Standing balance comment: UE support for balance  Cognition Arousal/Alertness: Awake/alert Behavior During Therapy: WFL for tasks assessed/performed Overall Cognitive Status: Within Functional Limits for tasks assessed                                        Exercises Total Joint Exercises Ankle Circles/Pumps: AROM;10 reps;Supine;Both Quad Sets: AROM;5  reps;Supine;Both Heel Slides: AAROM;Supine;Left;10 reps Hip ABduction/ADduction: AAROM;5 reps;Left;Supine Straight Leg Raises: AROM;Left;Supine;10 reps Goniometric ROM: 10-52 degrees    General Comments        Pertinent Vitals/Pain Pain Assessment: Faces Faces Pain Scale: Hurts whole lot Pain Location: L knee in CPM Pain Descriptors / Indicators: Discomfort;Aching Pain Intervention(s): Limited activity within patient's tolerance;Monitored during session;Repositioned    Home Living                      Prior Function            PT Goals (current goals can now be found in the care plan section) Acute Rehab PT Goals Patient Stated Goal: to go home Progress towards PT goals: Progressing toward goals    Frequency    7X/week      PT Plan Current plan remains appropriate    Co-evaluation              AM-PAC PT "6 Clicks" Mobility   Outcome Measure  Help needed turning from your back to your side while in a flat bed without using bedrails?: A Little Help needed moving from lying on your back to sitting on the side of a flat bed without using bedrails?: A Little Help needed moving to and from a bed to a chair (including a wheelchair)?: A Little Help needed standing up from a chair using your arms (e.g., wheelchair or bedside chair)?: A Lot Help needed to walk in hospital room?: A Little Help needed climbing 3-5 steps with a railing? : A Lot 6 Click Score: 16    End of Session Equipment Utilized During Treatment: Left knee immobilizer Activity Tolerance: Patient tolerated treatment well Patient left: with call bell/phone within reach;in chair(OT present ) Nurse Communication: Mobility status PT Visit Diagnosis: Difficulty in walking, not elsewhere classified (R26.2);Pain Pain - Right/Left: Left Pain - part of body: Knee     Time: [id number] PT Time Calculation (min) (ACUTE ONLY): 33 min  Charges:  $Gait Training: 8-22 mins $Therapeutic Exercise:  8-22 mins                     Nadelyn Enriques W,PT Acute Rehabilitation Services Pager:  [phone]  Office:  [place] 07/14/2019, 11:02 AM

## 2019-07-14 NOTE — Progress Notes (Signed)
07/14/19 1200  PT Visit Information  Last PT Received On 07/14/19  Assistance Needed +1  History of Present Illness Patient is a 78 y/o female admitted for L TKA.  PMH positive for cervical and breast CA and urinary retention (self caths at home).  Subjective Data  Patient Stated Goal go home  Precautions  Precautions Fall;Knee  Required Braces or Orthoses Knee Immobilizer - Left  Knee Immobilizer - Left On when out of bed or walking  Restrictions  Weight Bearing Restrictions No  LLE Weight Bearing WBAT  Other Position/Activity Restrictions WBAT  Pain Assessment  Pain Assessment Faces  Faces Pain Scale 8  Pain Location left knee  Pain Descriptors / Indicators Discomfort;Aching  Pain Intervention(s) Limited activity within patient's tolerance;Monitored during session;Repositioned  Cognition  Arousal/Alertness Awake/alert  Behavior During Therapy WFL for tasks assessed/performed  Overall Cognitive Status Within Functional Limits for tasks assessed  Bed Mobility  Overal bed mobility Needs Assistance  Bed Mobility Sit to Supine  Sit to supine Min assist  General bed mobility comments Needed a little assist for LE back into bed  Transfers  Overall transfer level Needs assistance  Equipment used Rolling walker (2 wheeled)  Transfers Sit to/from Stand  Sit to Stand Mod assist;From elevated surface  General transfer comment heavy mod A lifting help from recliner   Ambulation/Gait  Ambulation/Gait assistance Min assist;Min guard  Gait Distance (Feet) 5 Feet  Assistive device Rolling walker (2 wheeled)  Gait Pattern/deviations Step-to pattern;Decreased stride length;Antalgic;Trunk flexed;Wide base of support  General Gait Details fatigued with ambulation, heavy UE use with L LE weight bearing and UE's fatigued. Assisted pt back to bed per pt request  Gait velocity interpretation <1.31 ft/sec, indicative of household ambulator  General stair comments Went over technique with pts  daughter and daughter understands.  Handout given.   Balance  Overall balance assessment Needs assistance  Sitting-balance support No upper extremity supported;Feet supported  Sitting balance-Leahy Scale Good  Standing balance support Bilateral upper extremity supported  Standing balance-Leahy Scale Poor  Standing balance comment UE support for balance  General Comments  General comments (skin integrity, edema, etc.) Daughter educated on all aspects of mobility for pt to include car transfer and steps.  Also education re knee immobilizer, exercises and bone foam.   Exercises  Exercises Total Joint  Total Joint Exercises  Ankle Circles/Pumps AROM;10 reps;Supine;Both  [employer] AROM;5 reps;Supine;Both  Heel Slides AAROM;Supine;Left;10 reps  Hip ABduction/ADduction AAROM;5 reps;Left;Supine  Straight Leg Raises AROM;Left;Supine;10 reps  Towel Squeeze AROM;Both;10 reps;Supine  Long Arc Quad AROM;Both;5 reps;Seated  PT - End of Session  Equipment Utilized During Treatment Left knee immobilizer  Activity Tolerance Patient tolerated treatment well  Patient left with call bell/phone within reach;in bed;with family/visitor present (OT present )  Nurse Communication Mobility status  CPM Left Knee  Additional Comments trapeze bar and foot roll   PT - Assessment/Plan  PT Plan Current plan remains appropriate  PT Visit Diagnosis Difficulty in walking, not elsewhere classified (R26.2);Pain  Pain - Right/Left Left  Pain - part of body Knee  PT Frequency (ACUTE ONLY) 7X/week  Follow Up Recommendations Follow surgeon's recommendation for DC plan and follow-up therapies  PT equipment None recommended by PT  AM-PAC PT "6 Clicks" Mobility Outcome Measure (Version 2)  Help needed turning from your back to your side while in a flat bed without using bedrails? 3  Help needed moving from lying on your back to sitting on the side of a flat  bed without using bedrails? 3  Help needed moving to and from a  bed to a chair (including a wheelchair)? 3  Help needed standing up from a chair using your arms (e.g., wheelchair or bedside chair)? 2  Help needed to walk in hospital room? 3  Help needed climbing 3-5 steps with a railing?  2  6 Click Score 16  Consider Recommendation of Discharge To: Home with [facility]  PT Goal Progression  Progress towards PT goals Progressing toward goals  PT Time Calculation  PT Start Time (ACUTE ONLY) 1135  PT Stop Time (ACUTE ONLY) 1159  PT Time Calculation (min) (ACUTE ONLY) 24 min  PT General Charges  $$ ACUTE PT VISIT 1 Visit  PT Treatments  $Therapeutic Exercise 8-22 mins  $Therapeutic Activity 8-22 mins  Pt progressing well. Daughter educated and feels comfortable taking pt home. Called nursingto let them knoww education is complete.   Winni Ehrhard W,PT Acute Rehabilitation Services Pager:  [phone]  Office:  [phone]

## 2019-07-14 NOTE — Telephone Encounter (Signed)
Cancelled there, pharmacy tech did not cancel. Sent in [place] as pt requested. No action needed. I called and talked with pharmacist.

## 2019-07-14 NOTE — Progress Notes (Signed)
Patient has c/o pain folow up heart rate is 88 apical

## 2019-07-14 NOTE — Telephone Encounter (Signed)
Order 5mg  they are curently out of stock. Have the Oxycodone plain not the combo tylenol.  May have to call another pharmacy if combo is wanted.  If you want the plain another Rx will have to be sent in.  [phone] Joe

## 2019-07-14 NOTE — Telephone Encounter (Signed)
Pt called in said she went to the pharmacy to try and pick up her medications but the pharmacy said they don't have any medications for the pt. Please have any medications the pt needs sent to [facility] in [place].   [phone]

## 2019-07-14 NOTE — Progress Notes (Signed)
Pt Given discharge Instructions. Reviewed s/s of infection with patient. She verbalizes understanding. Daughter at bedside. Dressing removed and mepilex applied to incision site on left knee. Staples intact. No redness or drainage noted from site. Ted Hose placed on that leg.

## 2019-07-14 NOTE — Telephone Encounter (Signed)
FYI her Daugther Abigail Butts went to get it at [facility]

## 2019-07-14 NOTE — Progress Notes (Addendum)
   Subjective: 1 Day Post-Op Procedure(s) (LRB): LEFT TOTAL KNEE ARTHROPLASTY-CEMENTED (Left) Patient reports pain as mild.    Objective: Vital signs in last 24 hours: Temp:  [97.6 F (36.4 C)-98.3 F (36.8 C)] 98.2 F (36.8 C) (12/22 0359) Pulse Rate:  [54-110] 109 (12/22 0652) Resp:  [15-18] 18 (12/22 0652) BP: (108-153)/(65-80) 131/73 (12/22 0652) SpO2:  [95 %-100 %] 95 % (12/22 0652) Weight:  [60.8 kg] 60.8 kg (12/21 1036)  Intake/Output from previous day: 12/21 0701 - 12/22 0700 In: 1767.7 [P.O.:240; I.V.:1527.7] Out: 825 [Urine:725; Blood:100] Intake/Output this shift: Total I/O In: -  Out: 400 [Urine:400]  Recent Labs    07/14/19 0455  HGB 10.3*   Recent Labs    07/14/19 0455  WBC 8.4  RBC 3.49*  HCT 30.9*  PLT 299   Recent Labs    07/14/19 0455  NA 131*  K 4.2  CL 99  CO2 24  BUN 6*  CREATININE 0.61  GLUCOSE 167*  CALCIUM 8.3*   Recent Labs    07/13/19 1037  INR 1.0    Neurologically intact DG Knee 1-2 Views Left  Result Date: 07/13/2019 CLINICAL DATA:  78 year old female status post knee surgery. EXAM: LEFT KNEE - 1-2 VIEW COMPARISON:  None. FINDINGS: AP and cross-table lateral views of the left knee. Left total knee arthroplasty hardware in place. Hardware appears intact and normally aligned. Overlying anterior skin staples. Postoperative changes to the patella. Postoperative regional soft tissue gas. No unexpected osseous changes. No joint effusion is evident. IMPRESSION: Left knee arthroplasty with no adverse features. Electronically Signed   By: Genevie Ann M.D.   On: 07/13/2019 15:11    Assessment/Plan: 1 Day Post-Op Procedure(s) (LRB): LEFT TOTAL KNEE ARTHROPLASTY-CEMENTED (Left) Up with therapy times 2 today then discharge home. Need HHPT set up.  rx sent in for pain, muscle relaxant . She will restart eliquis on Wednesday for a fib. Office one week Rx sent in to [facility].   Marybelle Killings 07/14/2019, 8:11 AM

## 2019-07-14 NOTE — Telephone Encounter (Signed)
Patient requesting all medications new and refills be sent to [facility] in [place] [address]

## 2019-07-14 NOTE — Plan of Care (Signed)

## 2019-07-14 NOTE — Plan of Care (Signed)

## 2019-07-14 NOTE — Telephone Encounter (Signed)
Dr. [name] spoke with patient. She originally told him to send to [facility] in [place].  Daughter is on her way to [facility] to pick up rx.

## 2019-07-15 ENCOUNTER — Telehealth: Payer: Self-pay | Admitting: *Deleted

## 2019-07-15 NOTE — Telephone Encounter (Signed)
Ortho bundle discharge call completed.

## 2019-07-15 NOTE — Care Plan (Signed)
RNCM call to patient to check status after discharge home yesterday from the hospital s/p Left TKA per Dr. [name]. She verbalized she is doing well. Pain is moderate. Doing ok with pain medication. Reminded that F/U appointment with Dr. [name] rescheduled as he requested 1 week post-op. Appointment now scheduled for 07/23/19 at 2:30 pm. Patient verbalized understanding. She denies any other needs at this time. Will continue to assess for any CM needs. Also noted that CM would be scheduling her OPPT with [facility]. Will call early next week to get this scheduled for around 2 weeks post-op.

## 2019-07-15 NOTE — Telephone Encounter (Signed)
noted 

## 2019-07-16 DIAGNOSIS — E785 Hyperlipidemia, unspecified: Secondary | ICD-10-CM | POA: Diagnosis not present

## 2019-07-16 DIAGNOSIS — Z87891 Personal history of nicotine dependence: Secondary | ICD-10-CM | POA: Diagnosis not present

## 2019-07-16 DIAGNOSIS — Z9181 History of falling: Secondary | ICD-10-CM | POA: Diagnosis not present

## 2019-07-16 DIAGNOSIS — Z7901 Long term (current) use of anticoagulants: Secondary | ICD-10-CM | POA: Diagnosis not present

## 2019-07-16 DIAGNOSIS — M109 Gout, unspecified: Secondary | ICD-10-CM | POA: Diagnosis not present

## 2019-07-16 DIAGNOSIS — R339 Retention of urine, unspecified: Secondary | ICD-10-CM | POA: Diagnosis not present

## 2019-07-16 DIAGNOSIS — Z853 Personal history of malignant neoplasm of breast: Secondary | ICD-10-CM | POA: Diagnosis not present

## 2019-07-16 DIAGNOSIS — K760 Fatty (change of) liver, not elsewhere classified: Secondary | ICD-10-CM | POA: Diagnosis not present

## 2019-07-16 DIAGNOSIS — K469 Unspecified abdominal hernia without obstruction or gangrene: Secondary | ICD-10-CM | POA: Diagnosis not present

## 2019-07-16 DIAGNOSIS — Z471 Aftercare following joint replacement surgery: Secondary | ICD-10-CM | POA: Diagnosis not present

## 2019-07-16 DIAGNOSIS — Z8541 Personal history of malignant neoplasm of cervix uteri: Secondary | ICD-10-CM | POA: Diagnosis not present

## 2019-07-16 DIAGNOSIS — N39490 Overflow incontinence: Secondary | ICD-10-CM | POA: Diagnosis not present

## 2019-07-16 DIAGNOSIS — Z96652 Presence of left artificial knee joint: Secondary | ICD-10-CM | POA: Diagnosis not present

## 2019-07-16 DIAGNOSIS — I700 Atherosclerosis of aorta: Secondary | ICD-10-CM | POA: Diagnosis not present

## 2019-07-16 DIAGNOSIS — I4819 Other persistent atrial fibrillation: Secondary | ICD-10-CM | POA: Diagnosis not present

## 2019-07-18 DIAGNOSIS — M109 Gout, unspecified: Secondary | ICD-10-CM | POA: Diagnosis not present

## 2019-07-18 DIAGNOSIS — I4819 Other persistent atrial fibrillation: Secondary | ICD-10-CM | POA: Diagnosis not present

## 2019-07-18 DIAGNOSIS — I700 Atherosclerosis of aorta: Secondary | ICD-10-CM | POA: Diagnosis not present

## 2019-07-18 DIAGNOSIS — K760 Fatty (change of) liver, not elsewhere classified: Secondary | ICD-10-CM | POA: Diagnosis not present

## 2019-07-18 DIAGNOSIS — R339 Retention of urine, unspecified: Secondary | ICD-10-CM | POA: Diagnosis not present

## 2019-07-18 DIAGNOSIS — Z471 Aftercare following joint replacement surgery: Secondary | ICD-10-CM | POA: Diagnosis not present

## 2019-07-21 ENCOUNTER — Telehealth: Payer: Self-pay | Admitting: *Deleted

## 2019-07-21 DIAGNOSIS — Z471 Aftercare following joint replacement surgery: Secondary | ICD-10-CM | POA: Diagnosis not present

## 2019-07-21 DIAGNOSIS — M109 Gout, unspecified: Secondary | ICD-10-CM | POA: Diagnosis not present

## 2019-07-21 DIAGNOSIS — R339 Retention of urine, unspecified: Secondary | ICD-10-CM | POA: Diagnosis not present

## 2019-07-21 DIAGNOSIS — I4819 Other persistent atrial fibrillation: Secondary | ICD-10-CM | POA: Diagnosis not present

## 2019-07-21 DIAGNOSIS — K760 Fatty (change of) liver, not elsewhere classified: Secondary | ICD-10-CM | POA: Diagnosis not present

## 2019-07-21 DIAGNOSIS — I700 Atherosclerosis of aorta: Secondary | ICD-10-CM | POA: Diagnosis not present

## 2019-07-21 NOTE — Care Plan (Signed)
RNCM call to patient to check status at 7 days post-op. She verbalized she is doing very well. Therapy was there today and will be coming back two more times this week. She verbalized she is improving daily. No needs currently. Informed that CM has been in contact with [facility] in [place] to schedule OPPT as discussed with her earlier. Appointment for initial evaluation scheduled for 07/27/2019 at 11:00 am with arrival at 10:45 am for paperwork. Patient is agreeable to date/time. Reminded of upcoming appointment with Dr. [name] this week on Thursday, 07/23/19 at 2:30 pm. Will continue to monitor for CM needs.

## 2019-07-21 NOTE — Telephone Encounter (Signed)
Ortho bundle 7 day call completed. 

## 2019-07-22 DIAGNOSIS — Z471 Aftercare following joint replacement surgery: Secondary | ICD-10-CM | POA: Diagnosis not present

## 2019-07-22 DIAGNOSIS — I4819 Other persistent atrial fibrillation: Secondary | ICD-10-CM | POA: Diagnosis not present

## 2019-07-22 DIAGNOSIS — I700 Atherosclerosis of aorta: Secondary | ICD-10-CM | POA: Diagnosis not present

## 2019-07-22 DIAGNOSIS — K760 Fatty (change of) liver, not elsewhere classified: Secondary | ICD-10-CM | POA: Diagnosis not present

## 2019-07-22 DIAGNOSIS — R339 Retention of urine, unspecified: Secondary | ICD-10-CM | POA: Diagnosis not present

## 2019-07-22 DIAGNOSIS — M109 Gout, unspecified: Secondary | ICD-10-CM | POA: Diagnosis not present

## 2019-07-23 ENCOUNTER — Other Ambulatory Visit: Payer: Self-pay | Admitting: Orthopaedic Surgery

## 2019-07-23 ENCOUNTER — Ambulatory Visit: Payer: Self-pay

## 2019-07-23 ENCOUNTER — Ambulatory Visit (INDEPENDENT_AMBULATORY_CARE_PROVIDER_SITE_OTHER): Payer: Medicare Other | Admitting: Orthopaedic Surgery

## 2019-07-23 ENCOUNTER — Encounter: Payer: Self-pay | Admitting: Orthopaedic Surgery

## 2019-07-23 ENCOUNTER — Telehealth: Payer: Self-pay | Admitting: Radiology

## 2019-07-23 VITALS — Ht 63.0 in | Wt 134.0 lb

## 2019-07-23 DIAGNOSIS — Z96652 Presence of left artificial knee joint: Secondary | ICD-10-CM

## 2019-07-23 MED ORDER — OXYCODONE-ACETAMINOPHEN 5-325 MG PO TABS: 1.0000 | ORAL_TABLET | Freq: Four times a day (QID) | ORAL | 0 refills | Status: DC | PRN

## 2019-07-23 NOTE — Progress Notes (Signed)
[facility] [place] did not have percocet 5/325 in stock , pt asked to send to [facility].

## 2019-07-23 NOTE — Progress Notes (Signed)
Post-Op Visit Note   Patient: Pam [name]           Date of Birth: 16-Jul-1941           MRN: [id number] Visit Date: 07/23/2019 PCP: Neale Burly, MD   Assessment & Plan: Post total knee arthroplasty.  Range of motion is 10 to 60 degrees she starts outpatient next week.  Staples will be removed in 1 week for office follow-up 1 week.  We went over additional flexion-extension exercises.  Chief Complaint:  Chief Complaint  Patient presents with  . Left Knee - Routine Post Op    07/13/2019 Left TKA Cemented   Visit Diagnoses:  1. S/P total knee arthroplasty, left     Plan: Return in 1 week.  Percocet renewed.  Follow-Up Instructions: No follow-ups on file.   Orders:  Orders Placed This Encounter  Procedures  . XR Knee 1-2 Views Left   Meds ordered this encounter  Medications  . oxyCODONE-acetaminophen (PERCOCET/ROXICET) 5-325 MG tablet    Sig: Take 1-2 tablets by mouth every 6 (six) hours as needed for severe pain. Post op  total knee pain    Dispense:  30 tablet    Refill:  0    Imaging: No results found.  PMFS History: Patient Active Problem List   Diagnosis Date Noted  . Unilateral primary osteoarthritis, left knee 07/13/2019  . Arthritis of left knee 07/13/2019  . Persistent atrial fibrillation ([place]) 06/07/2019  . PAF (paroxysmal atrial fibrillation) ([place])   . Candidiasis, vagina 01/02/2019  . Hernia of abdominal cavity 10/07/2015  . Overflow incontinence of urine 07/04/2015  . Urinary retention with incomplete bladder emptying 05/09/2015  . Cervical cancer ([place]) 03/23/2015   Past Medical History:  Diagnosis Date  . Arthritis   . Atrial fibrillation ([place])   . Cancer [facility])    BREAST CANCER / CERVICAL CANCER   . Cervical cancer ([place])   . Dysrhythmia   . History of breast cancer 2006   Dr. Sonny Dandy oncologist - left breast  . History of gout   . Hyperlipidemia   . Urinary retention Sept 2016   s/p hysterectomy    Family History  Problem  Relation Age of Onset  . Prostate cancer Father   . Heart failure Father        78s  . Heart failure Mother        12  . Breast cancer Sister     Past Surgical History:  Procedure Laterality Date  . BREAST SURGERY    . CARDIOVERSION N/A 06/03/2019   Procedure: CARDIOVERSION;  Surgeon: Jerline Pain, MD;  Location: [facility] ENDOSCOPY;  Service: Cardiovascular;  Laterality: N/A;  . CATARACT EXTRACTION W/ INTRAOCULAR LENS  IMPLANT, BILATERAL    . EYE SURGERY    . INGUINAL HERNIA REPAIR Right 12/14/2015   Procedure: LAPAROSCOPIC AND OPEN RIGHT  INGUINAL HERNIA;  Surgeon: Johnathan Hausen, MD;  Location: [facility] ORS;  Service: General;  Laterality: Right;  . KNEE ARTHROSCOPY Left   . MASTECTOMY Left 2006  . MASTECTOMY WITH AXILLARY LYMPH NODE DISSECTION Left   . ROBOTIC ASSISTED TOTAL HYSTERECTOMY WITH BILATERAL SALPINGO OOPHERECTOMY Bilateral 04/07/2015   Procedure: ROBOTIC ASSISTED RADICAL HYSTERECTOMY BILATERAL SALPINGO OOPHORECTOMY BILATERAL SENTINEL LYMPHADENETOMY;  Surgeon: Everitt Amber, MD;  Location: [facility] ORS;  Service: Gynecology;  Laterality: Bilateral;  . TOTAL KNEE ARTHROPLASTY Left 07/13/2019   Procedure: LEFT TOTAL KNEE ARTHROPLASTY-CEMENTED;  Surgeon: Marybelle Killings, MD;  Location: [facility];  Service: Orthopedics;  Laterality: Left;  . TUBAL LIGATION     Social History   Occupational History  . 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 Smoking status: Former Smoker    Packs/day: 1.00    Years: 30.00    Pack years: 30.00    Types: Cigarettes    Quit date: 07/23/1994    Years since quitting: 25.0  . Smokeless tobacco: Never Used  Substance and Sexual Activity  . Alcohol use: Never    Alcohol/week: 0.0 standard drinks  . Drug use: Never  . Sexual activity: Not Currently

## 2019-07-23 NOTE — Telephone Encounter (Signed)
done

## 2019-07-23 NOTE — Telephone Encounter (Signed)
[facility] in [place] did not have pain medication. Patient requests you send it to [facility] in [place].

## 2019-07-24 DIAGNOSIS — Z471 Aftercare following joint replacement surgery: Secondary | ICD-10-CM | POA: Diagnosis not present

## 2019-07-24 DIAGNOSIS — M109 Gout, unspecified: Secondary | ICD-10-CM | POA: Diagnosis not present

## 2019-07-24 DIAGNOSIS — R339 Retention of urine, unspecified: Secondary | ICD-10-CM | POA: Diagnosis not present

## 2019-07-24 DIAGNOSIS — I4819 Other persistent atrial fibrillation: Secondary | ICD-10-CM | POA: Diagnosis not present

## 2019-07-24 DIAGNOSIS — K760 Fatty (change of) liver, not elsewhere classified: Secondary | ICD-10-CM | POA: Diagnosis not present

## 2019-07-24 DIAGNOSIS — I700 Atherosclerosis of aorta: Secondary | ICD-10-CM | POA: Diagnosis not present

## 2019-07-27 DIAGNOSIS — M25562 Pain in left knee: Secondary | ICD-10-CM | POA: Diagnosis not present

## 2019-07-27 DIAGNOSIS — Z7189 Other specified counseling: Secondary | ICD-10-CM | POA: Insufficient documentation

## 2019-07-27 NOTE — Progress Notes (Signed)
Cardiology Office Note   Date:  07/29/2019   ID:  Pam [name], Pam [name] [name] May 16, 1941, MRN [id number]  PCP:  Neale Burly, MD  Cardiologist:   Minus Breeding, MD  Chief Complaint  Patient presents with  . Atrial Fibrillation      History of Present Illness: Pam [name] Pam [name] [name] is a 79 y.o. female who was referred by Neale Burly, MD for evaluation of atrial fib.   After I last saw her I ordered an echo which was unremarkable. She had cardioversion but only stayed in sinus rhythm for about two days.   She went back into atrial fib.  She is now being managed with rate control.    Since I last saw her she had knee surgery.  She has done well.  She denies any new cardiovascular symptoms.  She is wearing a Ecologist and her heart rate today when I looked at it averages less than 100.  The patient denies any new symptoms such as chest discomfort, neck or arm discomfort. There has been no new shortness of breath, PND or orthopnea. There have been no reported palpitations, presyncope or syncope.    Past Medical History:  Diagnosis Date  . Arthritis   . Atrial fibrillation ([place])   . Cancer [facility])    BREAST CANCER / CERVICAL CANCER   . Cervical cancer ([place])   . Dysrhythmia   . History of breast cancer 2006   Pam [name] Pam [name] [name] oncologist - left breast  . History of gout   . Hyperlipidemia   . Urinary retention Sept 2016   s/p hysterectomy    Past Surgical History:  Procedure Laterality Date  . BREAST SURGERY    . CARDIOVERSION N/A 06/03/2019   Procedure: CARDIOVERSION;  Surgeon: Jerline Pain, MD;  Location: [facility] ENDOSCOPY;  Service: Cardiovascular;  Laterality: N/A;  . CATARACT EXTRACTION W/ INTRAOCULAR LENS  IMPLANT, BILATERAL    . EYE SURGERY    . INGUINAL HERNIA REPAIR Right 12/14/2015   Procedure: LAPAROSCOPIC AND OPEN RIGHT  INGUINAL HERNIA;  Surgeon: Johnathan Hausen, MD;  Location: [facility] ORS;  Service: General;  Laterality: Right;  . KNEE ARTHROSCOPY Left   . MASTECTOMY Left 2006  .  MASTECTOMY WITH AXILLARY LYMPH NODE DISSECTION Left   . ROBOTIC ASSISTED TOTAL HYSTERECTOMY WITH BILATERAL SALPINGO OOPHERECTOMY Bilateral 04/07/2015   Procedure: ROBOTIC ASSISTED RADICAL HYSTERECTOMY BILATERAL SALPINGO OOPHORECTOMY BILATERAL SENTINEL LYMPHADENETOMY;  Surgeon: Everitt Amber, MD;  Location: [facility] ORS;  Service: Gynecology;  Laterality: Bilateral;  . TOTAL KNEE ARTHROPLASTY Left 07/13/2019   Procedure: LEFT TOTAL KNEE ARTHROPLASTY-CEMENTED;  Surgeon: Marybelle Killings, MD;  Location: [place];  Service: Orthopedics;  Laterality: Left;  . TUBAL LIGATION       Current Outpatient Medications  Medication Sig Dispense Refill  . apixaban (ELIQUIS) 5 MG TABS tablet Take 5 mg by mouth 2 (two) times daily.     . Calcium Carb-Cholecalciferol (CALCIUM 600 + D PO) Take 1 tablet by mouth daily.     Marland Kitchen diltiazem (CARDIZEM CD) 360 MG 24 hr capsule Take 1 capsule (360 mg total) by mouth daily. 90 capsule 3  . fexofenadine (ALLEGRA) 180 MG tablet Take 180 mg by mouth daily.    Marland Kitchen gabapentin (NEURONTIN) 100 MG capsule Take 100 mg by mouth at bedtime.  1  . magnesium hydroxide (DULCOLAX) 400 MG/5ML suspension Take 15 mLs by mouth daily as needed for mild constipation.    . methocarbamol (ROBAXIN) 500 MG tablet Take 1 tablet (  500 mg total) by mouth every 8 (eight) hours as needed for muscle spasms. 20 tablet 1  . metoprolol tartrate (LOPRESSOR) 25 MG tablet Take 1 tablet (25 mg total) by mouth 2 (two) times daily. 180 tablet 3  . oxyCODONE-acetaminophen (PERCOCET/ROXICET) 5-325 MG tablet Take 1-2 tablets by mouth every 6 (six) hours as needed for severe pain. 40 tablet 0  . rOPINIRole (REQUIP) 1 MG tablet Take 1 mg by mouth at bedtime.  0  . Catheters MISC 1 Units by Does not apply route every 3 (three) hours as needed. 60 each 10   No current facility-administered medications for this visit.    Allergies:   Aspirin, Ciprofloxacin, Flagyl [metronidazole], and Penicillins    ROS:  Please see the history of  present illness.   Otherwise, review of systems are positive for none.   All other systems are reviewed and negative.    PHYSICAL EXAM: VS:  BP 118/70   Pulse 93   Ht 5\' 3"  (1.6 m)   Wt 133 lb (60.3 kg)   BMI 23.56 kg/m  , BMI Body mass index is 23.56 kg/m. GENERAL:  Well appearing NECK:  No jugular venous distention, waveform within normal limits, carotid upstroke brisk and symmetric, no bruits, no thyromegaly LUNGS:  Clear to auscultation bilaterally CHEST:  Unremarkable HEART:  PMI not displaced or sustained,S1 and S2 within normal limits, no S3, no clicks, no rubs, no murmurs, irregular ABD:  Flat, positive bowel sounds normal in frequency in pitch, no bruits, no rebound, no guarding, no midline pulsatile mass, no hepatomegaly, no splenomegaly EXT:  2 plus pulses throughout, no edema, no cyanosis no clubbing   EKG:  EKG is not ordered today.   Recent Labs: 04/23/2019: TSH 0.598 07/02/2019: ALT 24 07/14/2019: BUN 6; Creatinine, Ser 0.61; Hemoglobin 10.3; Platelets 299; Potassium 4.2; Sodium 131    Lipid Panel No results found for: CHOL, TRIG, HDL, CHOLHDL, VLDL, LDLCALC, LDLDIRECT    Wt Readings from Last 3 Encounters:  07/29/19 133 lb (60.3 kg)  07/23/19 134 lb (60.8 kg)  07/13/19 134 lb (60.8 kg)      Other studies Reviewed: Additional studies/ records that were reviewed today include: None. Review of the above records demonstrates:  Please see elsewhere in the note.     ASSESSMENT AND PLAN:  ATRIAL FIB:Pam [name] Sale Corumhas a CHA2DS2 - VASc score of 3.     Tolerates anticoagulation.  She seems to have good rate control.  She will let me know as she is wearing her monitor.  For now no change in therapy.   COVID EDUCATION:  The patient was educated about the vaccine.     Current medicines are reviewed at length with the patient today.  The patient does not have concerns regarding medicines.  The following changes have been made:  no change  Labs/ tests  ordered today include:  No orders of the defined types were placed in this encounter.    Disposition:   FU with me in one year.     Signed, Minus Breeding, MD  07/29/2019 4:56 PM    [facility]

## 2019-07-28 ENCOUNTER — Telehealth: Payer: Self-pay | Admitting: Cardiology

## 2019-07-28 NOTE — Telephone Encounter (Signed)

## 2019-07-29 ENCOUNTER — Other Ambulatory Visit: Payer: Self-pay

## 2019-07-29 ENCOUNTER — Encounter: Payer: Self-pay | Admitting: Cardiology

## 2019-07-29 ENCOUNTER — Ambulatory Visit (INDEPENDENT_AMBULATORY_CARE_PROVIDER_SITE_OTHER): Payer: Medicare Other | Admitting: Cardiology

## 2019-07-29 VITALS — BP 118/70 | HR 93 | Ht 63.0 in | Wt 133.0 lb

## 2019-07-29 DIAGNOSIS — I4819 Other persistent atrial fibrillation: Secondary | ICD-10-CM

## 2019-07-29 DIAGNOSIS — I4891 Unspecified atrial fibrillation: Secondary | ICD-10-CM | POA: Diagnosis not present

## 2019-07-29 DIAGNOSIS — Z7901 Long term (current) use of anticoagulants: Secondary | ICD-10-CM | POA: Diagnosis not present

## 2019-07-29 DIAGNOSIS — Z7189 Other specified counseling: Secondary | ICD-10-CM

## 2019-07-29 NOTE — Patient Instructions (Signed)
Medication Instructions:  The current medical regimen is effective;  continue present plan and medications.  *If you need a refill on your cardiac medications before your next appointment, please call your pharmacy*  Follow-Up: At [facility], you and your health needs are our priority.  As part of our continuing mission to provide you with exceptional heart care, we have created designated Provider Care Teams.  These Care Teams include your primary Cardiologist (physician) and Advanced Practice Providers (APPs -  Physician Assistants and Nurse Practitioners) who all work together to provide you with the care you need, when you need it.  Your next appointment:   12 month(s)  The format for your next appointment:   In Person  Provider:   James Hochrein, MD  Thank you for choosing [facility]!!     

## 2019-07-30 ENCOUNTER — Encounter: Payer: Self-pay | Admitting: Orthopaedic Surgery

## 2019-07-30 ENCOUNTER — Telehealth: Payer: Self-pay | Admitting: *Deleted

## 2019-07-30 ENCOUNTER — Ambulatory Visit (INDEPENDENT_AMBULATORY_CARE_PROVIDER_SITE_OTHER): Payer: Medicare Other | Admitting: Orthopaedic Surgery

## 2019-07-30 DIAGNOSIS — Z96652 Presence of left artificial knee joint: Secondary | ICD-10-CM | POA: Insufficient documentation

## 2019-07-30 MED ORDER — OXYCODONE-ACETAMINOPHEN 5-325 MG PO TABS: 1.0000 | ORAL_TABLET | Freq: Three times a day (TID) | ORAL | 0 refills | Status: DC | PRN

## 2019-07-30 NOTE — Telephone Encounter (Signed)
Attempted 2 week post-op call; no answer and no way to leave VM.

## 2019-07-30 NOTE — Progress Notes (Signed)
Post-Op Visit Note   Patient: Pam [name]           Date of Birth: 1940-11-15           MRN: [id number] Visit Date: 07/30/2019 PCP: Neale Burly, MD   Assessment & Plan: Post left total knee arthroplasty 07/13/2019.  Chief Complaint:  Chief Complaint  Patient presents with  . Left Knee - Follow-up    07/13/2019 Left TKA   Visit Diagnoses:  1. S/P total knee arthroplasty, left     Plan: 15 Percocet sent in.  Cora previously only had 12 tablets total in the store to give her.  New prescription was sent in.  Patient strength is good.  She lacks 10 degrees reaching extension we went over exercises prone positioning using second sugar book bag to help get her out to the full extension.  Continue flexion exercises which is making satisfactory progress.  Recheck 4 weeks.  Follow-Up Instructions: Return in about 4 weeks (around 08/27/2019).   Orders:  No orders of the defined types were placed in this encounter.  Meds ordered this encounter  Medications  . oxyCODONE-acetaminophen (PERCOCET/ROXICET) 5-325 MG tablet    Sig: Take 1 tablet by mouth every 8 (eight) hours as needed for severe pain.    Dispense:  15 tablet    Refill:  0    Imaging: No results found.  PMFS History: Patient Active Problem List   Diagnosis Date Noted  . S/P total knee arthroplasty, 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  . Educated about COVID-19 virus infection 07/27/2019  . Persistent atrial fibrillation ([place]) 06/07/2019  . PAF (paroxysmal atrial fibrillation) ([place])   . Candidiasis, vagina 01/02/2019  . Hernia of abdominal cavity 10/07/2015  . Overflow incontinence of urine 07/04/2015  . Urinary retention with incomplete bladder emptying 05/09/2015  . Cervical cancer ([place]) 03/23/2015   Past Medical History:  Diagnosis Date  . Arthritis   . Atrial fibrillation ([employer])   . Cancer [facility])    BREAST CANCER / CERVICAL CANCER   . Cervical cancer ([place])   . Dysrhythmia   . History of breast cancer 2006    Dr. Sonny Dandy oncologist - left breast  . History of gout   . Hyperlipidemia   . Urinary retention Sept 2016   s/p hysterectomy    Family History  Problem Relation Age of Onset  . Prostate cancer Father   . Heart failure Father        86s  . Heart failure Mother        63  . Breast cancer Sister     Past Surgical History:  Procedure Laterality Date  . BREAST SURGERY    . CARDIOVERSION N/A 06/03/2019   Procedure: CARDIOVERSION;  Surgeon: Jerline Pain, MD;  Location: [facility] ENDOSCOPY;  Service: Cardiovascular;  Laterality: N/A;  . CATARACT EXTRACTION W/ INTRAOCULAR LENS  IMPLANT, BILATERAL    . EYE SURGERY    . INGUINAL HERNIA REPAIR Right 12/14/2015   Procedure: LAPAROSCOPIC AND OPEN RIGHT  INGUINAL HERNIA;  Surgeon: Johnathan Hausen, MD;  Location[facility] ORS;  Service: General;  Laterality: Right;  . KNEE ARTHROSCOPY Left   . MASTECTOMY Left 2006  . MASTECTOMY WITH AXILLARY LYMPH NODE DISSECTION Left   . ROBOTIC ASSISTED TOTAL HYSTERECTOMY WITH BILATERAL SALPINGO OOPHERECTOMY Bilateral 04/07/2015   Procedure: ROBOTIC ASSISTED RADICAL HYSTERECTOMY BILATERAL SALPINGO OOPHORECTOMY BILATERAL SENTINEL LYMPHADENETOMY;  Surgeon: Everitt Amber, MD;  [facility] ORS;  Service: Gynecology;  Laterality: Bilateral;  . TOTAL KNEE ARTHROPLASTY  Left 07/13/2019   Procedure: LEFT TOTAL KNEE ARTHROPLASTY-CEMENTED;  Surgeon: Marybelle Killings, MD;  Location: [place];  Service: Orthopedics;  Laterality: Left;  . TUBAL LIGATION     Social History   Occupational History  . 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 Smoking status: Former Smoker    Packs/day: 1.00    Years: 30.00    Pack years: 30.00    Types: Cigarettes    Quit date: 07/23/1994    Years since quitting: 25.0  . Smokeless tobacco: Never Used  Substance and Sexual Activity  . Alcohol use: Never    Alcohol/week: 0.0 standard drinks  . Drug use: Never  . Sexual activity: Not Currently

## 2019-07-30 NOTE — Telephone Encounter (Signed)
Attempted again to reach out to patient for 14 day post-op call; no answer and no VM set up

## 2019-07-31 ENCOUNTER — Telehealth: Payer: Self-pay | Admitting: *Deleted

## 2019-07-31 NOTE — Care Plan (Signed)
RNCM call to patient to check status just over 2 weeks post-surgery. She apologized for not getting back with me, but she had to see her MD yesterday. She states she is doing well overall. She is attending OPPT at [facility] in [place] and this is going well. She will f/u with Dr. [name] in 4 weeks. No other issues or concerns at this time.

## 2019-07-31 NOTE — Telephone Encounter (Signed)
Ortho bundle 14 day call completed. 

## 2019-08-03 DIAGNOSIS — M25562 Pain in left knee: Secondary | ICD-10-CM | POA: Diagnosis not present

## 2019-08-03 NOTE — Discharge Summary (Signed)
Patient ID: Pam [name] MRN: [name] DOB/AGE: 1940/11/24 79 y.o.  Admit date: 07/13/2019 Discharge date: 08/03/2019  Admission Diagnoses:  Active Problems:   * No active hospital problems. *   Discharge Diagnoses:  Active Problems:   * No active hospital problems. *  status post Procedure(s): LEFT TOTAL KNEE ARTHROPLASTY-CEMENTED  Past Medical History:  Diagnosis Date  . Arthritis   . Atrial fibrillation (Pam [name])   . Cancer [facility])    BREAST CANCER / CERVICAL CANCER   . Cervical cancer (Pam [name])   . Dysrhythmia   . History of breast cancer 2006   Dr. Sonny Dandy oncologist - left breast  . History of gout   . Hyperlipidemia   . Urinary retention Sept 2016   s/p hysterectomy    Surgeries: Procedure(s): LEFT TOTAL KNEE ARTHROPLASTY-CEMENTED on 07/13/2019   Consultants:   Discharged Condition: Improved  Hospital Course: Pam [name] is an 79 y.o. female who was admitted 07/13/2019 for operative treatment of Unilateral primary osteoarthritis, left knee. Patient failed conservative treatments (please see the history and physical for the specifics) and had severe unremitting pain that affects sleep, daily activities and work/hobbies. After pre-op clearance, the patient was taken to the operating room on 07/13/2019 and underwent  Procedure(s): LEFT TOTAL KNEE ARTHROPLASTY-CEMENTED.    Patient was given perioperative antibiotics:  Anti-infectives (From admission, onward)   Start     Dose/Rate Route Frequency Ordered Stop   07/13/19 1045  vancomycin (VANCOCIN) IVPB 1000 mg/200 mL premix     1,000 mg 200 mL/hr over 60 Minutes Intravenous On call to O.R. 07/13/19 1036 07/13/19 1209       Patient was given sequential compression devices and early ambulation to prevent DVT.   Patient benefited maximally from hospital stay and there were no complications. At the time of discharge, the patient was urinating/moving their bowels without difficulty, tolerating a regular diet, pain  is controlled with oral pain medications and they have been cleared by PT/OT.   Recent vital signs: No data found.   Recent laboratory studies: No results for input(s): WBC, HGB, HCT, PLT, NA, K, CL, CO2, BUN, CREATININE, GLUCOSE, INR, CALCIUM in the last 72 hours.  Invalid input(s): PT, 2   Discharge Medications:   Allergies as of 07/14/2019      Reactions   Aspirin Nausea And Vomiting   Ciprofloxacin Diarrhea   Flagyl [metronidazole] Diarrhea   Penicillins Hives, Other (See Comments)   Did it involve swelling of the face/tongue/throat, SOB, or low BP? No Did it involve sudden or severe rash/hives, skin peeling, or any reaction on the inside of your mouth or nose? Yes Did you need to seek medical attention at a hospital or doctor's office? Yes When did it last happen?15+ years If all above answers are "NO", may proceed with cephalosporin use.      Medication List    STOP taking these medications   acetaminophen-codeine 300-30 MG tablet Commonly known as: TYLENOL #3   aspirin EC 81 MG tablet   traMADol 50 MG tablet Commonly known as: ULTRAM   UNABLE TO FIND     TAKE these medications   apixaban 5 MG Tabs tablet Commonly known as: ELIQUIS Take 5 mg by mouth 2 (two) times daily.   CALCIUM 600 + D PO Take 1 tablet by mouth daily.   Catheters Misc 1 Units by Does not apply route every 3 (three) hours as needed.   diltiazem 360 MG 24 hr capsule Commonly known as: CARDIZEM  CD Take 1 capsule (360 mg total) by mouth daily. What changed: Another medication with the same name was removed. Continue taking this medication, and follow the directions you see here.   Dulcolax 400 MG/5ML suspension Generic drug: magnesium hydroxide Take 15 mLs by mouth daily as needed for mild constipation.   fexofenadine 180 MG tablet Commonly known as: ALLEGRA Take 180 mg by mouth daily.   gabapentin 100 MG capsule Commonly known as: NEURONTIN Take 100 mg by mouth at bedtime.    methocarbamol 500 MG tablet Commonly known as: Robaxin Take 1 tablet (500 mg total) by mouth every 8 (eight) hours as needed for muscle spasms.   metoprolol tartrate 25 MG tablet Commonly known as: LOPRESSOR Take 1 tablet (25 mg total) by mouth 2 (two) times daily.   rOPINIRole 1 MG tablet Commonly known as: REQUIP Take 1 mg by mouth at bedtime.       Diagnostic Studies: DG Knee 1-2 Views Left  Result Date: 07/13/2019 CLINICAL DATA:  79 year old female status post knee surgery. EXAM: LEFT KNEE - 1-2 VIEW COMPARISON:  None. FINDINGS: AP and cross-table lateral views of the left knee. Left total knee arthroplasty hardware in place. Hardware appears intact and normally aligned. Overlying anterior skin staples. Postoperative changes to the patella. Postoperative regional soft tissue gas. No unexpected osseous changes. No joint effusion is evident. IMPRESSION: Left knee arthroplasty with no adverse features. Electronically Signed   By: Genevie Ann M.D.   On: 07/13/2019 15:11   CT Abdomen Pelvis W Contrast  Result Date: 07/08/2019 CLINICAL DATA:  Follow-up cervical carcinoma. Blood in stool. Increased left lower extremity swelling. Personal history of left breast carcinoma. EXAM: CT ABDOMEN AND PELVIS WITH CONTRAST TECHNIQUE: Multidetector CT imaging of the abdomen and pelvis was performed using the standard protocol following bolus administration of intravenous contrast. CONTRAST:  64mL OMNIPAQUE IOHEXOL 300 MG/ML  SOLN COMPARISON:  None. FINDINGS: Lower Chest: No acute findings. Hepatobiliary: No hepatic masses identified. Mild diffuse hepatic steatosis. Gallbladder is unremarkable. No evidence of biliary ductal dilatation. Pancreas:  No mass or inflammatory changes. Spleen: Within normal limits in size and appearance. Adrenals/Urinary Tract: No masses identified. No evidence of hydronephrosis. Mild diffuse bladder wall thickening is seen, which may be seen with cystitis or neurogenic bladder.  Stomach/Bowel: No evidence of obstruction, inflammatory process or abnormal fluid collections. Vascular/Lymphatic: No pathologically enlarged lymph nodes. No abdominal aortic aneurysm. Aortic atherosclerosis incidentally noted. Reproductive: Prior hysterectomy noted. Adnexal regions are unremarkable in appearance. Other:  None. Musculoskeletal:  No suspicious bone lesions identified. IMPRESSION: 1. Mild diffuse bladder wall thickening, which may be due to cystitis or neurogenic bladder. 2. No evidence of recurrent or metastatic carcinoma within the abdomen or pelvis. 3. Hepatic steatosis. Aortic Atherosclerosis (ICD10-I70.0). Electronically Signed   By: Marlaine Hind M.D.   On: 07/08/2019 15:06      Follow-up Information    Marybelle Killings, MD. Go in 1 week(s).   Specialty: Orthopedic Surgery Why: at 10:00 am in the [place] location, not [facility], for your  week post-op appointment with Dr. [name]. on new yrs eve. 07/23/19 Contact information: [place] [address] [phone]        Home, [facility] Follow up.   Specialty: Home Health Services Why: You have been authorized for 5 home health therapy visits with [facility]. Someone from the agency will be in contact with you after discharge to arrange your first in home visit. Contact information: [address]  [place] [address] [phone]           Discharge Plan:  discharge to home  Disposition:     Signed: Benjiman Core  08/03/2019, 3:08 PM

## 2019-08-05 DIAGNOSIS — M25562 Pain in left knee: Secondary | ICD-10-CM | POA: Diagnosis not present

## 2019-08-11 DIAGNOSIS — M25562 Pain in left knee: Secondary | ICD-10-CM | POA: Diagnosis not present

## 2019-08-13 DIAGNOSIS — M25562 Pain in left knee: Secondary | ICD-10-CM | POA: Diagnosis not present

## 2019-08-17 ENCOUNTER — Telehealth: Payer: Self-pay | Admitting: *Deleted

## 2019-08-17 NOTE — Telephone Encounter (Signed)
Attempted 30 day ortho bundle call. Left message on home number requesting call back.

## 2019-08-18 ENCOUNTER — Telehealth: Payer: Self-pay | Admitting: *Deleted

## 2019-08-18 DIAGNOSIS — M25562 Pain in left knee: Secondary | ICD-10-CM | POA: Diagnosis not present

## 2019-08-18 NOTE — Telephone Encounter (Signed)
Ortho bundle 30 day call attempted for 2nd time; left VM for patient to return call.

## 2019-08-20 DIAGNOSIS — M25562 Pain in left knee: Secondary | ICD-10-CM | POA: Diagnosis not present

## 2019-08-25 DIAGNOSIS — M25562 Pain in left knee: Secondary | ICD-10-CM | POA: Diagnosis not present

## 2019-08-27 ENCOUNTER — Telehealth: Payer: Self-pay

## 2019-08-27 ENCOUNTER — Telehealth: Payer: Self-pay | Admitting: Radiology

## 2019-08-27 ENCOUNTER — Encounter: Payer: Self-pay | Admitting: Orthopaedic Surgery

## 2019-08-27 ENCOUNTER — Ambulatory Visit (INDEPENDENT_AMBULATORY_CARE_PROVIDER_SITE_OTHER): Payer: Medicare Other | Admitting: Orthopaedic Surgery

## 2019-08-27 VITALS — BP 116/79 | HR 86 | Ht 63.0 in | Wt 133.0 lb

## 2019-08-27 DIAGNOSIS — M1711 Unilateral primary osteoarthritis, right knee: Secondary | ICD-10-CM | POA: Insufficient documentation

## 2019-08-27 DIAGNOSIS — Z96652 Presence of left artificial knee joint: Secondary | ICD-10-CM

## 2019-08-27 DIAGNOSIS — M25562 Pain in left knee: Secondary | ICD-10-CM | POA: Diagnosis not present

## 2019-08-27 NOTE — Telephone Encounter (Signed)
Pt has been approved for monovisc -right knee. Since pt is seen in [place], do you need to schedule it?

## 2019-08-27 NOTE — Telephone Encounter (Signed)
Please obtain authorization for gel injection right knee for right knee OA.  Patient is seen in the [place] office. Thanks.

## 2019-08-27 NOTE — Telephone Encounter (Signed)
Approved for Monovisc-Right knee Dr. Jarome Lamas and [facility] No copay 20% OOP No auth required

## 2019-08-27 NOTE — Telephone Encounter (Signed)
Perfect. Yes, we will call patient from here and make appt. I will bring Monovisc back to the office with me next week. Thanks.

## 2019-08-27 NOTE — Telephone Encounter (Signed)
Ok perfect thank you.

## 2019-08-27 NOTE — Progress Notes (Signed)
Post-Op Visit Note   Patient: Pam [name]           Date of Birth: 02-20-1941           MRN: [id number] Visit Date: 08/27/2019 PCP: Neale Burly, MD   Assessment & Plan: Patient returns post left total knee arthroplasty on 07/13/2019 and progressive increase right knee pain from osteoarthritis.  Patient is requesting a gel injection right knee which she had last year and states it works much better than the cortisone injection.  She has been on the right leg more since the left knee is postop.  Range of motion left knee is 3 to 94 degrees.  Quad strength is good she still needs to work on prone positioning get the last few degrees of extension.  She has been off her pain medication for 2 weeks.  We will call her once Visco injection approved for her right knee.  Chief Complaint:  Chief Complaint  Patient presents with  . Left Knee - Follow-up    07/13/2019 Left TKA   Visit Diagnoses: Postop left total knee arthroplasty.  2.  Right knee primary osteoarthritis.   Plan: Return for right knee Visco injection soon as it is approved otherwise follow-up in 4 weeks.  Follow-Up Instructions: Return in about 4 weeks (around 09/24/2019).   Orders:  No orders of the defined types were placed in this encounter.  No orders of the defined types were placed in this encounter.   Imaging: No results found.  PMFS History: Patient Active Problem List   Diagnosis Date Noted  . Unilateral primary osteoarthritis, right knee 08/27/2019  . S/P total knee arthroplasty, 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  . Educated about COVID-19 virus infection 07/27/2019  . Persistent atrial fibrillation ([place]) 06/07/2019  . PAF (paroxysmal atrial fibrillation) ([place])   . Candidiasis, vagina 01/02/2019  . Hernia of abdominal cavity 10/07/2015  . Overflow incontinence of urine 07/04/2015  . Urinary retention with incomplete bladder emptying 05/09/2015  . Cervical cancer ([place]) 03/23/2015   Past Medical History:  Diagnosis  Date  . Arthritis   . Atrial fibrillation ([place])   . Cancer [facility])    BREAST CANCER / CERVICAL CANCER   . Cervical cancer ([place])   . Dysrhythmia   . History of breast cancer 2006   Dr. Sonny Dandy oncologist - left breast  . History of gout   . Hyperlipidemia   . Urinary retention Sept 2016   s/p hysterectomy    Family History  Problem Relation Age of Onset  . Prostate cancer Father   . Heart failure Father        75s  . Heart failure Mother        7  . Breast cancer Sister     Past Surgical History:  Procedure Laterality Date  . BREAST SURGERY    . CARDIOVERSION N/A 06/03/2019   Procedure: CARDIOVERSION;  Surgeon: Jerline Pain, MD;  Location: [facility] ENDOSCOPY;  Service: Cardiovascular;  Laterality: N/A;  . CATARACT EXTRACTION W/ INTRAOCULAR LENS  IMPLANT, BILATERAL    . EYE SURGERY    . INGUINAL HERNIA REPAIR Right 12/14/2015   Procedure: LAPAROSCOPIC AND OPEN RIGHT  INGUINAL HERNIA;  Surgeon: Johnathan Hausen, MD;  [facility];  Service: General;  Laterality: Right;  . KNEE ARTHROSCOPY Left   . MASTECTOMY Left 2006  . MASTECTOMY WITH AXILLARY LYMPH NODE DISSECTION Left   . ROBOTIC ASSISTED TOTAL HYSTERECTOMY WITH BILATERAL SALPINGO OOPHERECTOMY Bilateral 04/07/2015   Procedure: ROBOTIC ASSISTED  RADICAL HYSTERECTOMY BILATERAL SALPINGO OOPHORECTOMY BILATERAL SENTINEL LYMPHADENETOMY;  Surgeon: Everitt Amber, MD;  [facility];  Service: Gynecology;  Laterality: Bilateral;  . TOTAL KNEE ARTHROPLASTY Left 07/13/2019   Procedure: LEFT TOTAL KNEE ARTHROPLASTY-CEMENTED;  Surgeon: Marybelle Killings, MD;  Location: [place];  Service: Orthopedics;  Laterality: Left;  . TUBAL LIGATION     Social History   Occupational History  . 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 Smoking status: Former Smoker    Packs/day: 1.00    Years: 30.00    Pack years: 30.00    Types: Cigarettes    Quit date: 07/23/1994    Years since quitting: 25.1  . Smokeless tobacco: Never Used   Substance and Sexual Activity  . Alcohol use: Never    Alcohol/week: 0.0 standard drinks  . Drug use: Never  . Sexual activity: Not Currently

## 2019-08-27 NOTE — Telephone Encounter (Signed)
Submitted for VOB for Monovisc-right knee °

## 2019-08-31 DIAGNOSIS — M25562 Pain in left knee: Secondary | ICD-10-CM | POA: Diagnosis not present

## 2019-09-03 ENCOUNTER — Encounter: Payer: Self-pay | Admitting: Orthopaedic Surgery

## 2019-09-03 ENCOUNTER — Ambulatory Visit (INDEPENDENT_AMBULATORY_CARE_PROVIDER_SITE_OTHER): Payer: Medicare Other | Admitting: Orthopaedic Surgery

## 2019-09-03 VITALS — Ht 63.0 in | Wt 133.0 lb

## 2019-09-03 DIAGNOSIS — M1711 Unilateral primary osteoarthritis, right knee: Secondary | ICD-10-CM | POA: Diagnosis not present

## 2019-09-03 DIAGNOSIS — M25562 Pain in left knee: Secondary | ICD-10-CM | POA: Diagnosis not present

## 2019-09-03 MED ORDER — LIDOCAINE HCL 1 % IJ SOLN
0.5000 mL | INTRAMUSCULAR | Status: AC | PRN
  Administered 2019-09-03: .5 mL

## 2019-09-03 MED ORDER — HYALURONAN 88 MG/4ML IX SOSY
88.0000 mg | PREFILLED_SYRINGE | INTRA_ARTICULAR | Status: AC | PRN
  Administered 2019-09-03: 88 mg via INTRA_ARTICULAR

## 2019-09-03 NOTE — Progress Notes (Signed)
Office Visit Note   Patient: Pam [name]           Date of Birth: 16-Sep-1940           MRN: [id number] Visit Date: 09/03/2019              Requested by: Neale Burly, MD [place],  [place] [id number] PCP: Neale Burly, MD   Assessment & Plan: Visit Diagnoses: Post left total knee arthroplasty.  Right knee osteoarthritis.  Monovisc injection performed.  Plan: Monovisc injection performed right knee.  Continue strengthening left leg post total knee arthroplasty.  Follow-Up Instructions: No follow-ups on file.   Orders:  No orders of the defined types were placed in this encounter.  No orders of the defined types were placed in this encounter.     Procedures: Large Joint Inj: R knee on 09/03/2019 2:58 PM Indications: pain and joint swelling Details: 22 G 1.5 in needle, anterolateral approach  Arthrogram: No  Medications: 0.5 mL lidocaine 1 %; 88 mg Hyaluronan 88 MG/4ML Outcome: tolerated well, no immediate complications Procedure, treatment alternatives, risks and benefits explained, specific risks discussed. Consent was given by the patient. Immediately prior to procedure a time out was called to verify the correct patient, procedure, equipment, support staff and site/side marked as required. Patient was prepped and draped in the usual sterile fashion.       Clinical Data: No additional findings.   Subjective: No chief complaint on file.   HPI patient returns been approved and is here for Monovisc injection in the right knee.  Left total knee arthroplasty doing well.  Review of Systems no change   Objective: Vital Signs: There were no vitals taken for this visit.  Physical Exam no change Ortho Exam right knee crepitus well-healed left total knee arthroplasty incision.  Specialty Comments:  No specialty comments available.  Imaging: No results found.   PMFS History: Patient Active Problem List   Diagnosis Date Noted  .  Unilateral primary osteoarthritis, right knee 08/27/2019  . S/P total knee arthroplasty, 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  . Educated about COVID-19 virus infection 07/27/2019  . Persistent atrial fibrillation ([place]) 06/07/2019  . PAF (paroxysmal atrial fibrillation) ([place])   . Candidiasis, vagina 01/02/2019  . Hernia of abdominal cavity 10/07/2015  . Overflow incontinence of urine 07/04/2015  . Urinary retention with incomplete bladder emptying 05/09/2015  . Cervical cancer ([place]) 03/23/2015   Past Medical History:  Diagnosis Date  . Arthritis   . Atrial fibrillation ([place])   . Cancer [facility])    BREAST CANCER / CERVICAL CANCER   . Cervical cancer ([place])   . Dysrhythmia   . History of breast cancer 2006   Dr. Sonny Dandy oncologist - left breast  . History of gout   . Hyperlipidemia   . Urinary retention Sept 2016   s/p hysterectomy    Family History  Problem Relation Age of Onset  . Prostate cancer Father   . Heart failure Father        73s  . Heart failure Mother        19  . Breast cancer Sister     Past Surgical History:  Procedure Laterality Date  . BREAST SURGERY    . CARDIOVERSION N/A 06/03/2019   Procedure: CARDIOVERSION;  Surgeon: Jerline Pain, MD;  Location: [facility] ENDOSCOPY;  Service: Cardiovascular;  Laterality: N/A;  . CATARACT EXTRACTION W/ INTRAOCULAR LENS  IMPLANT, BILATERAL    . EYE SURGERY    .  INGUINAL HERNIA REPAIR Right 12/14/2015   Procedure: LAPAROSCOPIC AND OPEN RIGHT  INGUINAL HERNIA;  Surgeon: Johnathan Hausen, MD;  Location: [facility] ORS;  Service: General;  Laterality: Right;  . KNEE ARTHROSCOPY Left   . MASTECTOMY Left 2006  . MASTECTOMY WITH AXILLARY LYMPH NODE DISSECTION Left   . ROBOTIC ASSISTED TOTAL HYSTERECTOMY WITH BILATERAL SALPINGO OOPHERECTOMY Bilateral 04/07/2015   Procedure: ROBOTIC ASSISTED RADICAL HYSTERECTOMY BILATERAL SALPINGO OOPHORECTOMY BILATERAL SENTINEL LYMPHADENETOMY;  Surgeon: Everitt Amber, MD;  Location: [facility] ORS;  Service: Gynecology;  Laterality: Bilateral;  .  TOTAL KNEE ARTHROPLASTY Left 07/13/2019   Procedure: LEFT TOTAL KNEE ARTHROPLASTY-CEMENTED;  Surgeon: Marybelle Killings, MD;  Location: [place];  Service: Orthopedics;  Laterality: Left;  . TUBAL LIGATION     Social History   Occupational History  . 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 Smoking status: Former Smoker    Packs/day: 1.00    Years: 30.00    Pack years: 30.00    Types: Cigarettes    Quit date: 07/23/1994    Years since quitting: 25.1  . Smokeless tobacco: Never Used  Substance and Sexual Activity  . Alcohol use: Never    Alcohol/week: 0.0 standard drinks  . Drug use: Never  . Sexual activity: Not Currently

## 2019-09-07 DIAGNOSIS — M25562 Pain in left knee: Secondary | ICD-10-CM | POA: Diagnosis not present

## 2019-09-09 DIAGNOSIS — M7050 Other bursitis of knee, unspecified knee: Secondary | ICD-10-CM | POA: Diagnosis not present

## 2019-09-09 DIAGNOSIS — I10 Essential (primary) hypertension: Secondary | ICD-10-CM | POA: Diagnosis not present

## 2019-09-09 DIAGNOSIS — I480 Paroxysmal atrial fibrillation: Secondary | ICD-10-CM | POA: Diagnosis not present

## 2019-09-10 ENCOUNTER — Ambulatory Visit: Payer: Medicare Other | Admitting: Orthopaedic Surgery

## 2019-09-15 DIAGNOSIS — M25562 Pain in left knee: Secondary | ICD-10-CM | POA: Diagnosis not present

## 2019-09-17 ENCOUNTER — Ambulatory Visit: Payer: Medicare Other | Admitting: Orthopaedic Surgery

## 2019-09-17 DIAGNOSIS — M25562 Pain in left knee: Secondary | ICD-10-CM | POA: Diagnosis not present

## 2019-09-22 DIAGNOSIS — M25562 Pain in left knee: Secondary | ICD-10-CM | POA: Diagnosis not present

## 2019-09-24 ENCOUNTER — Ambulatory Visit (INDEPENDENT_AMBULATORY_CARE_PROVIDER_SITE_OTHER): Payer: Medicare Other | Admitting: Orthopaedic Surgery

## 2019-09-24 ENCOUNTER — Encounter: Payer: Self-pay | Admitting: Orthopaedic Surgery

## 2019-09-24 VITALS — BP 96/53 | HR 81 | Ht 62.0 in | Wt 127.0 lb

## 2019-09-24 DIAGNOSIS — M25562 Pain in left knee: Secondary | ICD-10-CM | POA: Diagnosis not present

## 2019-09-24 DIAGNOSIS — Z96652 Presence of left artificial knee joint: Secondary | ICD-10-CM

## 2019-09-24 DIAGNOSIS — M1711 Unilateral primary osteoarthritis, right knee: Secondary | ICD-10-CM

## 2019-09-24 NOTE — Progress Notes (Signed)
Post-Op Visit Note   Patient: Pam [name]           Date of Birth: 01/02/1941           MRN: [id number] Visit Date: 09/24/2019 PCP: Neale Burly, MD   Assessment & Plan: Post left total knee arthroplasty 07/12/2020.  She still does not have full extension.  She can increase the weight from 5 to 10 pounds with prone positioning.  Monovisc injection in her opposite right knee has given her improvement.  I will check her in 5 weeks.  She is happy with the pain relief she has gotten.  She still needs more quad strengthening and needs to get the last bit of extension her flexion is excellent.  Chief Complaint:  Chief Complaint  Patient presents with  . Left Knee - Follow-up    07/12/2020 Left TKA   Visit Diagnoses:  1. S/P total knee arthroplasty, left   2. Unilateral primary osteoarthritis, right knee     Plan: ROV 5 wks  Follow-Up Instructions: Return in about 5 weeks (around 10/29/2019).   Orders:  No orders of the defined types were placed in this encounter.  No orders of the defined types were placed in this encounter.   Imaging: No results found.  PMFS History: Patient Active Problem List   Diagnosis Date Noted  . Unilateral primary osteoarthritis, right knee 08/27/2019  . S/P total knee arthroplasty, 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  . Educated about COVID-19 virus infection 07/27/2019  . Persistent atrial fibrillation ([place]) 06/07/2019  . PAF (paroxysmal atrial fibrillation) ([place])   . Candidiasis, vagina 01/02/2019  . Hernia of abdominal cavity 10/07/2015  . Overflow incontinence of urine 07/04/2015  . Urinary retention with incomplete bladder emptying 05/09/2015  . Cervical cancer ([place]) 03/23/2015   Past Medical History:  Diagnosis Date  . Arthritis   . Atrial fibrillation ([place])   . Cancer [facility])    BREAST CANCER / CERVICAL CANCER   . Cervical cancer ([place])   . Dysrhythmia   . History of breast cancer 2006   Dr. Sonny Dandy oncologist - left breast  . History of gout   .  Hyperlipidemia   . Urinary retention Sept 2016   s/p hysterectomy    Family History  Problem Relation Age of Onset  . Prostate cancer Father   . Heart failure Father        56s  . Heart failure Mother        36  . Breast cancer Sister     Past Surgical History:  Procedure Laterality Date  . BREAST SURGERY    . CARDIOVERSION N/A 06/03/2019   Procedure: CARDIOVERSION;  Surgeon: Jerline Pain, MD;  Location: [facility] ENDOSCOPY;  Service: Cardiovascular;  Laterality: N/A;  . CATARACT EXTRACTION W/ INTRAOCULAR LENS  IMPLANT, BILATERAL    . EYE SURGERY    . INGUINAL HERNIA REPAIR Right 12/14/2015   Procedure: LAPAROSCOPIC AND OPEN RIGHT  INGUINAL HERNIA;  Surgeon: Johnathan Hausen, MD;  Location: [facility]S;  Service: General;  Laterality: Right;  . KNEE ARTHROSCOPY Left   . MASTECTOMY Left 2006  . MASTECTOMY WITH AXILLARY LYMPH NODE DISSECTION Left   . ROBOTIC ASSISTED TOTAL HYSTERECTOMY WITH BILATERAL SALPINGO OOPHERECTOMY Bilateral 04/07/2015   Procedure: ROBOTIC ASSISTED RADICAL HYSTERECTOMY BILATERAL SALPINGO OOPHORECTOMY BILATERAL SENTINEL LYMPHADENETOMY;  Surgeon: Everitt Amber, MD;  Location: [facility]S;  Service: Gynecology;  Laterality: Bilateral;  . TOTAL KNEE ARTHROPLASTY Left 07/13/2019   Procedure: LEFT TOTAL KNEE ARTHROPLASTY-CEMENTED;  Surgeon: [name],  Thana Farr, MD;  Location: [place];  Service: Orthopedics;  Laterality: Left;  . TUBAL LIGATION     Social History   Occupational History  . 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 Smoking status: Former Smoker    Packs/day: 1.00    Years: 30.00    Pack years: 30.00    Types: Cigarettes    Quit date: 07/23/1994    Years since quitting: 25.1  . Smokeless tobacco: Never Used  Substance and Sexual Activity  . Alcohol use: Never    Alcohol/week: 0.0 standard drinks  . Drug use: Never  . Sexual activity: Not Currently

## 2019-09-25 DIAGNOSIS — I480 Paroxysmal atrial fibrillation: Secondary | ICD-10-CM | POA: Diagnosis not present

## 2019-10-09 DIAGNOSIS — I480 Paroxysmal atrial fibrillation: Secondary | ICD-10-CM | POA: Diagnosis not present

## 2019-10-14 ENCOUNTER — Telehealth: Payer: Self-pay | Admitting: *Deleted

## 2019-10-14 NOTE — Telephone Encounter (Signed)
Attempted 90 day call to patient. No answer- left VM requesting call back.

## 2019-10-16 ENCOUNTER — Telehealth: Payer: Self-pay | Admitting: *Deleted

## 2019-10-16 NOTE — Telephone Encounter (Signed)
90 day Ortho bundle call completed. 

## 2019-10-16 NOTE — Care Plan (Signed)
90 day Ortho bundle call completed. Patient states she is doing very well. Has some stiffness that remains in the left knee, but otherwise she is much improved since surgery. She has completed all therapy and is doing home exercises daily. Reviewed Cammie Mcgee and aware of next scheduled appointment with MD. CM will be checking back with her at 1 year post-op.

## 2019-10-26 ENCOUNTER — Other Ambulatory Visit: Payer: Self-pay | Admitting: Obstetrics and Gynecology

## 2019-10-26 DIAGNOSIS — Z8249 Family history of ischemic heart disease and other diseases of the circulatory system: Secondary | ICD-10-CM | POA: Diagnosis not present

## 2019-10-26 DIAGNOSIS — I499 Cardiac arrhythmia, unspecified: Secondary | ICD-10-CM | POA: Diagnosis not present

## 2019-10-26 DIAGNOSIS — E7800 Pure hypercholesterolemia, unspecified: Secondary | ICD-10-CM | POA: Diagnosis not present

## 2019-10-26 DIAGNOSIS — D1739 Benign lipomatous neoplasm of skin and subcutaneous tissue of other sites: Secondary | ICD-10-CM | POA: Diagnosis not present

## 2019-10-29 ENCOUNTER — Encounter: Payer: Self-pay | Admitting: Orthopaedic Surgery

## 2019-10-29 ENCOUNTER — Other Ambulatory Visit: Payer: Self-pay

## 2019-10-29 ENCOUNTER — Ambulatory Visit (INDEPENDENT_AMBULATORY_CARE_PROVIDER_SITE_OTHER): Payer: Medicare Other | Admitting: Orthopaedic Surgery

## 2019-10-29 VITALS — BP 123/72 | HR 78 | Ht 62.0 in | Wt 127.0 lb

## 2019-10-29 DIAGNOSIS — I4819 Other persistent atrial fibrillation: Secondary | ICD-10-CM

## 2019-10-29 DIAGNOSIS — Z96652 Presence of left artificial knee joint: Secondary | ICD-10-CM | POA: Diagnosis not present

## 2019-10-29 DIAGNOSIS — M1711 Unilateral primary osteoarthritis, right knee: Secondary | ICD-10-CM

## 2019-10-29 NOTE — Progress Notes (Signed)
Office Visit Note   Patient: Pam [name]           Date of Birth: [birth year]           MRN: [id number] Visit Date: 10/29/2019              Requested by: Neale Burly, MD [place],  [place] [id number] PCP: Neale Burly, MD   Assessment & Plan: Visit Diagnoses:  1. S/P total knee arthroplasty, left   2. Unilateral primary osteoarthritis, right knee   3. Persistent atrial fibrillation ([place])     Plan: Patient would like to schedule right total knee arthroplasty in July.  She had a size 4  Attune femur, size 5 tibia and a 35 mm patella.  She will be able to see Dr. [name] and get started on Eliquis sleep for surgery stop the Eliquis 2 days before and then postop we we will restart the Eliquis and she can be transition back to her normal warfarin.  Questions were elicited and answered procedure discussed.  She can call about surgical scheduling in July at her convenience.  Follow-Up Instructions: for TKA right knee July 2021  Orders:  No orders of the defined types were placed in this encounter.  No orders of the defined types were placed in this encounter.     Procedures: No procedures performed   Clinical Data: No additional findings.   Subjective: Chief Complaint  Patient presents with  . Left Knee - Follow-up  . Right Knee - Pain, Follow-up    HPI 79 year old female returns post left total knee arthroplasty 07/13/2019 she is very happy with the pain relief left knee states he feels great.  Opposite right knee has progressive valgus and she had previous cortisone injections without relief and recent Monovisc injection last month without relief.  Patient has significant erosive changes in her right knee medial and lateral tricompartmental degenerative changes with sclerosis subchondral cyst formation and 17 degrees of valgus on standing film.  Opposite left total knee is straight in alignment.  Patient states she wants to schedule total knee  arthroplasty for her right knee in July.  Patient is on long-term warfarin.  Previously she switched to Eliquis the week before surgery then stopped it 2 days before the surgery and then restarted the Eliquis after the surgery transitioning back to her normal warfarin by Dr.Hasanaj.   Review of Systems positive breast cancer 2006 no active disease currently.  Cervical cancer 2016 hernia surgery 2018.  Chronic atrial fib cardioversion 2020.  Positive for acid reflux total knee arthroplasty left on 07/13/2019.     Objective: Vital Signs: BP 123/72   Pulse 78   Ht 5\' 2"  (1.575 m)   Wt 127 lb (57.6 kg)   BMI 23.23 kg/m   Physical Exam Constitutional:      Appearance: She is well-developed.  HENT:     Head: Normocephalic.     Right Ear: External ear normal.     Left Ear: External ear normal.  Eyes:     Pupils: Pupils are equal, round, and reactive to light.  Neck:     Thyroid: No thyromegaly.     Trachea: No tracheal deviation.  Cardiovascular:     Rate and Rhythm: Rhythm irregular.  Pulmonary:     Effort: Pulmonary effort is normal.  Abdominal:     Palpations: Abdomen is soft.  Skin:    General: Skin is warm and dry.  Neurological:  Mental Status: She is alert and oriented to person, place, and time.  Psychiatric:        Behavior: Behavior normal.     Ortho Exam Patient is amatory with a pronounced right knee limp.  She has valgus of the right knee.  Left knee arthroplasty is well-healed good collateral balance no swelling.  Medial lateral joint line tenderness right knee there is 2-3+ effusion. Specialty Comments:  No specialty comments available.  Imaging: No results found.   PMFS History: Patient Active Problem List   Diagnosis Date Noted  . Unilateral primary osteoarthritis, right knee 08/27/2019  . S/P total knee arthroplasty, 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  . Educated about COVID-19 virus infection 07/27/2019  . Persistent atrial fibrillation ([place]) 06/07/2019  . PAF  (paroxysmal atrial fibrillation) ([place])   . Candidiasis, vagina 01/02/2019  . Hernia of abdominal cavity 10/07/2015  . Overflow incontinence of urine 07/04/2015  . Urinary retention with incomplete bladder emptying 05/09/2015  . Cervical cancer ([place]) 03/23/2015   Past Medical History:  Diagnosis Date  . Arthritis   . Atrial fibrillation ([place])   . Cancer [facility])    BREAST CANCER / CERVICAL CANCER   . Cervical cancer ([place])   . Dysrhythmia   . History of breast cancer 2006   Dr. Sonny Dandy oncologist - left breast  . History of gout   . Hyperlipidemia   . Urinary retention Sept 2016   s/p hysterectomy    Family History  Problem Relation Age of Onset  . Prostate cancer Father   . Heart failure Father        45s  . Heart failure Mother        77  . Breast cancer Sister     Past Surgical History:  Procedure Laterality Date  . BREAST SURGERY    . CARDIOVERSION N/A 06/03/2019   Procedure: CARDIOVERSION;  Surgeon: Jerline Pain, MD;  Location: [facility] ENDOSCOPY;  Service: Cardiovascular;  Laterality: N/A;  . CATARACT EXTRACTION W/ INTRAOCULAR LENS  IMPLANT, BILATERAL    . EYE SURGERY    . INGUINAL HERNIA REPAIR Right 12/14/2015   Procedure: LAPAROSCOPIC AND OPEN RIGHT  INGUINAL HERNIA;  Surgeon: Johnathan Hausen, MD;  [facility];  Service: General;  Laterality: Right;  . KNEE ARTHROSCOPY Left   . MASTECTOMY Left 2006  . MASTECTOMY WITH AXILLARY LYMPH NODE DISSECTION Left   . ROBOTIC ASSISTED TOTAL HYSTERECTOMY WITH BILATERAL SALPINGO OOPHERECTOMY Bilateral 04/07/2015   Procedure: ROBOTIC ASSISTED RADICAL HYSTERECTOMY BILATERAL SALPINGO OOPHORECTOMY BILATERAL SENTINEL LYMPHADENETOMY;  Surgeon: Everitt Amber, MD;  [facility];  Service: Gynecology;  Laterality: Bilateral;  . TOTAL KNEE ARTHROPLASTY Left 07/13/2019   Procedure: LEFT TOTAL KNEE ARTHROPLASTY-CEMENTED;  Surgeon: Marybelle Killings, MD;  Location: [place];  Service: Orthopedics;  Laterality: Left;  . TUBAL LIGATION     Social  History   Occupational History  . 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 Smoking status: Former Smoker    Packs/day: 1.00    Years: 30.00    Pack years: 30.00    Types: Cigarettes    Quit date: 07/23/1994    Years since quitting: 25.2  . Smokeless tobacco: Never Used  Substance and Sexual Activity  . Alcohol use: Never    Alcohol/week: 0.0 standard drinks  . Drug use: Never  . Sexual activity: Not Currently

## 2019-10-30 DIAGNOSIS — I480 Paroxysmal atrial fibrillation: Secondary | ICD-10-CM | POA: Diagnosis not present

## 2019-11-03 DIAGNOSIS — Z8249 Family history of ischemic heart disease and other diseases of the circulatory system: Secondary | ICD-10-CM | POA: Diagnosis not present

## 2019-11-03 DIAGNOSIS — E7800 Pure hypercholesterolemia, unspecified: Secondary | ICD-10-CM | POA: Diagnosis not present

## 2019-11-03 DIAGNOSIS — I499 Cardiac arrhythmia, unspecified: Secondary | ICD-10-CM | POA: Diagnosis not present

## 2019-11-20 DIAGNOSIS — I480 Paroxysmal atrial fibrillation: Secondary | ICD-10-CM | POA: Diagnosis not present

## 2019-12-04 DIAGNOSIS — I499 Cardiac arrhythmia, unspecified: Secondary | ICD-10-CM | POA: Diagnosis not present

## 2019-12-07 DIAGNOSIS — M7050 Other bursitis of knee, unspecified knee: Secondary | ICD-10-CM | POA: Diagnosis not present

## 2019-12-07 DIAGNOSIS — I480 Paroxysmal atrial fibrillation: Secondary | ICD-10-CM | POA: Diagnosis not present

## 2019-12-07 DIAGNOSIS — I10 Essential (primary) hypertension: Secondary | ICD-10-CM | POA: Diagnosis not present

## 2019-12-13 ENCOUNTER — Other Ambulatory Visit: Payer: Self-pay

## 2019-12-13 ENCOUNTER — Inpatient Hospital Stay (HOSPITAL_COMMUNITY)
Admission: EM | Admit: 2019-12-13 | Discharge: 2019-12-16 | DRG: 392 | Disposition: A | Discharge status: Home / Self Care | Payer: Medicare Other | Attending: Internal Medicine | Admitting: Internal Medicine

## 2019-12-13 ENCOUNTER — Emergency Department (HOSPITAL_COMMUNITY): Payer: Medicare Other

## 2019-12-13 ENCOUNTER — Encounter (HOSPITAL_COMMUNITY): Payer: Self-pay | Admitting: Emergency Medicine

## 2019-12-13 DIAGNOSIS — M109 Gout, unspecified: Secondary | ICD-10-CM | POA: Diagnosis present

## 2019-12-13 DIAGNOSIS — Z7901 Long term (current) use of anticoagulants: Secondary | ICD-10-CM

## 2019-12-13 DIAGNOSIS — K529 Noninfective gastroenteritis and colitis, unspecified: Secondary | ICD-10-CM | POA: Diagnosis present

## 2019-12-13 DIAGNOSIS — Z881 Allergy status to other antibiotic agents status: Secondary | ICD-10-CM

## 2019-12-13 DIAGNOSIS — R109 Unspecified abdominal pain: Secondary | ICD-10-CM | POA: Diagnosis not present

## 2019-12-13 DIAGNOSIS — A09 Infectious gastroenteritis and colitis, unspecified: Principal | ICD-10-CM | POA: Diagnosis present

## 2019-12-13 DIAGNOSIS — Z20822 Contact with and (suspected) exposure to covid-19: Secondary | ICD-10-CM | POA: Diagnosis present

## 2019-12-13 DIAGNOSIS — Z8541 Personal history of malignant neoplasm of cervix uteri: Secondary | ICD-10-CM

## 2019-12-13 DIAGNOSIS — M1990 Unspecified osteoarthritis, unspecified site: Secondary | ICD-10-CM | POA: Diagnosis present

## 2019-12-13 DIAGNOSIS — Z79899 Other long term (current) drug therapy: Secondary | ICD-10-CM

## 2019-12-13 DIAGNOSIS — Z9012 Acquired absence of left breast and nipple: Secondary | ICD-10-CM

## 2019-12-13 DIAGNOSIS — Z1623 Resistance to quinolones and fluoroquinolones: Secondary | ICD-10-CM | POA: Diagnosis not present

## 2019-12-13 DIAGNOSIS — E785 Hyperlipidemia, unspecified: Secondary | ICD-10-CM | POA: Diagnosis present

## 2019-12-13 DIAGNOSIS — R339 Retention of urine, unspecified: Secondary | ICD-10-CM | POA: Diagnosis present

## 2019-12-13 DIAGNOSIS — Z8249 Family history of ischemic heart disease and other diseases of the circulatory system: Secondary | ICD-10-CM

## 2019-12-13 DIAGNOSIS — I4819 Other persistent atrial fibrillation: Secondary | ICD-10-CM | POA: Diagnosis not present

## 2019-12-13 DIAGNOSIS — Z87891 Personal history of nicotine dependence: Secondary | ICD-10-CM

## 2019-12-13 DIAGNOSIS — B9620 Unspecified Escherichia coli [E. coli] as the cause of diseases classified elsewhere: Secondary | ICD-10-CM | POA: Diagnosis present

## 2019-12-13 DIAGNOSIS — Z886 Allergy status to analgesic agent status: Secondary | ICD-10-CM

## 2019-12-13 DIAGNOSIS — Z853 Personal history of malignant neoplasm of breast: Secondary | ICD-10-CM

## 2019-12-13 DIAGNOSIS — G2581 Restless legs syndrome: Secondary | ICD-10-CM | POA: Diagnosis present

## 2019-12-13 DIAGNOSIS — Z880 Allergy status to penicillin: Secondary | ICD-10-CM

## 2019-12-13 DIAGNOSIS — R1031 Right lower quadrant pain: Secondary | ICD-10-CM

## 2019-12-13 DIAGNOSIS — N390 Urinary tract infection, site not specified: Secondary | ICD-10-CM | POA: Diagnosis not present

## 2019-12-13 DIAGNOSIS — R509 Fever, unspecified: Secondary | ICD-10-CM

## 2019-12-13 DIAGNOSIS — T83511A Infection and inflammatory reaction due to indwelling urethral catheter, initial encounter: Secondary | ICD-10-CM | POA: Diagnosis not present

## 2019-12-13 DIAGNOSIS — Z803 Family history of malignant neoplasm of breast: Secondary | ICD-10-CM

## 2019-12-13 DIAGNOSIS — E876 Hypokalemia: Secondary | ICD-10-CM | POA: Diagnosis not present

## 2019-12-13 DIAGNOSIS — I480 Paroxysmal atrial fibrillation: Secondary | ICD-10-CM | POA: Diagnosis present

## 2019-12-13 LAB — COMPREHENSIVE METABOLIC PANEL
ALT: 15 U/L (ref 0–44)
AST: 16 U/L (ref 15–41)
Albumin: 4.2 g/dL (ref 3.5–5.0)
Alkaline Phosphatase: 94 U/L (ref 38–126)
Anion gap: 10 (ref 5–15)
BUN: 21 mg/dL (ref 8–23)
CO2: 26 mmol/L (ref 22–32)
Calcium: 9.1 mg/dL (ref 8.9–10.3)
Chloride: 97 mmol/L — ABNORMAL LOW (ref 98–111)
Creatinine, Ser: 0.69 mg/dL (ref 0.44–1.00)
GFR calc Af Amer: >60 mL/min (ref >60)
GFR calc non Af Amer: >60 mL/min (ref >60)
Glucose, Bld: 121 mg/dL — ABNORMAL HIGH (ref 70–99)
Potassium: 4.3 mmol/L (ref 3.5–5.1)
Sodium: 133 mmol/L — ABNORMAL LOW (ref 135–145)
Total Bilirubin: 0.6 mg/dL (ref 0.3–1.2)
Total Protein: 6.7 g/dL (ref 6.5–8.1)

## 2019-12-13 LAB — URINALYSIS, ROUTINE W REFLEX MICROSCOPIC
Bilirubin Urine: NEGATIVE
Glucose, UA: NEGATIVE mg/dL
Ketones, ur: NEGATIVE mg/dL
Nitrite: POSITIVE — AB
Protein, ur: NEGATIVE mg/dL
Specific Gravity, Urine: 1.016 (ref 1.005–1.030)
WBC, UA: >50 WBC/hpf — ABNORMAL HIGH (ref 0–5)
pH: 5 (ref 5.0–8.0)

## 2019-12-13 LAB — CBC
HCT: 41 % (ref 36.0–46.0)
Hemoglobin: 13.2 g/dL (ref 12.0–15.0)
MCH: 27.7 pg (ref 26.0–34.0)
MCHC: 32.2 g/dL (ref 30.0–36.0)
MCV: 86 fL (ref 80.0–100.0)
Platelets: 315 10*3/uL (ref 150–400)
RBC: 4.77 MIL/uL (ref 3.87–5.11)
RDW: 15 % (ref 11.5–15.5)
WBC: 13.4 10*3/uL — ABNORMAL HIGH (ref 4.0–10.5)
nRBC: 0 % (ref 0.0–0.2)

## 2019-12-13 LAB — LIPASE, BLOOD: Lipase: 17 U/L (ref 11–51)

## 2019-12-13 MED ORDER — METRONIDAZOLE 500 MG PO TABS
500.0000 mg | ORAL_TABLET | Freq: Once | ORAL | Status: AC
  Administered 2019-12-14: 500 mg via ORAL
  Filled 2019-12-13: qty 1

## 2019-12-13 MED ORDER — MORPHINE SULFATE (PF) 2 MG/ML IV SOLN
2.0000 mg | Freq: Once | INTRAVENOUS | Status: AC
  Administered 2019-12-13: 2 mg via INTRAVENOUS
  Filled 2019-12-13: qty 1

## 2019-12-13 MED ORDER — CIPROFLOXACIN IN D5W 400 MG/200ML IV SOLN
400.0000 mg | Freq: Once | INTRAVENOUS | Status: AC
  Administered 2019-12-14: 400 mg via INTRAVENOUS
  Filled 2019-12-13: qty 200

## 2019-12-13 MED ORDER — IOHEXOL 300 MG/ML  SOLN
100.0000 mL | Freq: Once | INTRAMUSCULAR | Status: AC | PRN
  Administered 2019-12-13: 100 mL via INTRAVENOUS

## 2019-12-13 NOTE — ED Provider Notes (Signed)
[facility] EMERGENCY DEPARTMENT Provider Note   CSN: [id number] Arrival date & time: 12/13/19  1930     History Chief Complaint  Patient presents with  . Abdominal Pain    Pam [name] is a 79 y.o. female.  HPI      Pam [name] is a 79 y.o. female with past medical history significant for atrial fibrillation (anticoagulated with warfarin) and takes Cardizem and metoprolol for rate control, prior breast and cervical cancer (with left-sided mastectomy), and history of incomplete bladder emptying and performs self catheterizations.  Pam [name] presents to the Emergency Department complaining of sudden onset of mid to right lower abdominal pain that began around 3 PM this evening.  Pam [name] describes the pain as severe and constant.  Pain has been unrelieved with position change.  No change with food intake.  Not associated with nausea or vomiting or diarrhea.  Pam [name] denies fever, chills, back pain or dysuria.  Pam [name] states Pam [name] is followed by gynecologic oncology for routine follow-ups.  Pam [name] is not currently on chemo or radiation.   Past Medical History:  Diagnosis Date  . Arthritis   . Atrial fibrillation ([place])   . Cancer [facility])    BREAST CANCER / CERVICAL CANCER   . Cervical cancer ([place])   . Dysrhythmia   . History of breast cancer 2006   Dr. Sonny Dandy oncologist - left breast  . History of gout   . Hyperlipidemia   . Urinary retention Sept 2016   s/p hysterectomy    Patient Active Problem List   Diagnosis Date Noted  . Unilateral primary osteoarthritis, right knee 08/27/2019  . S/P total knee arthroplasty, 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  . Educated about COVID-19 virus infection 07/27/2019  . Persistent atrial fibrillation ([place]) 06/07/2019  . PAF (paroxysmal atrial fibrillation) ([place])   . Candidiasis, vagina 01/02/2019  . Hernia of abdominal cavity 10/07/2015  . Overflow incontinence of urine 07/04/2015  . Urinary retention with incomplete bladder emptying 05/09/2015  . Cervical cancer ([place])  03/23/2015    Past Surgical History:  Procedure Laterality Date  . BREAST SURGERY    . CARDIOVERSION N/A 06/03/2019   Procedure: CARDIOVERSION;  Surgeon: Jerline Pain, MD;  Location: [facility] ENDOSCOPY;  Service: Cardiovascular;  Laterality: N/A;  . CATARACT EXTRACTION W/ INTRAOCULAR LENS  IMPLANT, BILATERAL    . EYE SURGERY    . INGUINAL HERNIA REPAIR Right 12/14/2015   Procedure: LAPAROSCOPIC AND OPEN RIGHT  INGUINAL HERNIA;  Surgeon: Johnathan Hausen, MD;  Location: [facility] ORS;  Service: General;  Laterality: Right;  . KNEE ARTHROSCOPY Left   . MASTECTOMY Left 2006  . MASTECTOMY WITH AXILLARY LYMPH NODE DISSECTION Left   . ROBOTIC ASSISTED TOTAL HYSTERECTOMY WITH BILATERAL SALPINGO OOPHERECTOMY Bilateral 04/07/2015   Procedure: ROBOTIC ASSISTED RADICAL HYSTERECTOMY BILATERAL SALPINGO OOPHORECTOMY BILATERAL SENTINEL LYMPHADENETOMY;  Surgeon: Everitt Amber, MD;  Location: [facility] ORS;  Service: Gynecology;  Laterality: Bilateral;  . TOTAL KNEE ARTHROPLASTY Left 07/13/2019   Procedure: LEFT TOTAL KNEE ARTHROPLASTY-CEMENTED;  Surgeon: Marybelle Killings, MD;  Location: [place];  Service: Orthopedics;  Laterality: Left;  . TUBAL LIGATION       OB History   No obstetric history on file.     Family History  Problem Relation Age of Onset  . Prostate cancer Father   . Heart failure Father        33s  . Heart failure Mother        77  . Breast cancer Sister     Social History  Tobacco Use  . Smoking status: Former Smoker    Packs/day: 1.00    Years: 30.00    Pack years: 30.00    Types: Cigarettes    Quit date: 07/23/1994    Years since quitting: 25.4  . Smokeless tobacco: Never Used  Substance Use Topics  . Alcohol use: Never    Alcohol/week: 0.0 standard drinks  . Drug use: Never    Home Medications Prior to Admission medications   Medication Sig Start Date End Date Taking? Authorizing Provider  bisacodyl (DULCOLAX) 5 MG EC tablet Take 5 mg by mouth daily.   Yes [provider]    diltiazem (CARDIZEM CD) 360 MG 24 hr capsule Take 1 capsule (360 mg total) by mouth daily. 06/08/19 12/13/19 Yes Minus Breeding, MD  fexofenadine (ALLEGRA) 180 MG tablet Take 180 mg by mouth daily.   Yes [provider]  gabapentin (NEURONTIN) 100 MG capsule Take 100 mg by mouth at bedtime. 04/24/17  Yes [provider]  metoprolol tartrate (LOPRESSOR) 25 MG tablet Take 1 tablet (25 mg total) by mouth 2 (two) times daily. 07/02/19 06/26/20 Yes Hochrein, Jeneen Rinks, MD  rOPINIRole (REQUIP) 1 MG tablet Take 1 mg by mouth at bedtime. 04/05/17  Yes [provider]  traMADol (ULTRAM) 50 MG tablet Take 50 mg by mouth 2 (two) times daily as needed for moderate pain.  11/04/19  Yes [provider]  warfarin (COUMADIN) 5 MG tablet Take 5-7.5 mg by mouth See admin instructions. Take 20m daily in the morning. Take 7.568mon Tuesdays and Thursdays   Yes [provider]    Allergies    Aspirin, Ciprofloxacin, Flagyl [metronidazole], and Penicillins  Review of Systems   Review of Systems  Constitutional: Negative for appetite change, chills and fever.  Respiratory: Negative for shortness of breath.   Cardiovascular: Negative for chest pain.  Gastrointestinal: Positive for abdominal pain. Negative for blood in stool, diarrhea, nausea and vomiting.  Genitourinary: Negative for decreased urine volume, difficulty urinating, dysuria and flank pain.  Musculoskeletal: Negative for back pain.  Skin: Negative for color change and rash.  Neurological: Negative for dizziness, syncope, weakness and numbness.  Hematological: Negative for adenopathy.    Physical Exam Updated Vital Signs BP 133/68   Pulse 96   Temp 98.7 F (37.1 C) (Oral)   Resp 18   Ht 5' 3"  (1.6 m)   Wt 58.1 kg   SpO2 94%   BMI 22.67 kg/m   Physical Exam Vitals and nursing note reviewed.  Constitutional:      General: Pam [name] is not in acute distress.    Appearance: Pam [name] is well-developed. Pam [name] is not  ill-appearing.  HENT:     Mouth/Throat:     Mouth: Mucous membranes are moist.  Cardiovascular:     Rate and Rhythm: Normal rate and regular rhythm.     Pulses: Normal pulses.  Pulmonary:     Effort: Pulmonary effort is normal.     Breath sounds: Normal breath sounds.  Chest:     Chest wall: No tenderness.  Abdominal:     General: There is no distension.     Palpations: Abdomen is soft. There is no mass.     Tenderness: There is abdominal tenderness. There is no right CVA tenderness or left CVA tenderness.     Comments: Patient has tenderness palpation of the periumbilical and right lower quadrant.  Abdomen is soft, nondistended.  No peritoneal signs.  Musculoskeletal:  General: Normal range of motion.  Skin:    Capillary Refill: Capillary refill takes less than 2 seconds.     Findings: No rash.  Neurological:     General: No focal deficit present.     Mental Status: Pam [name] is alert.     Sensory: No sensory deficit.     Motor: No weakness.     ED Results / Procedures / Treatments   Labs (all labs ordered are listed, but only abnormal results are displayed) Labs Reviewed  COMPREHENSIVE METABOLIC PANEL - Abnormal; Notable for the following components:      Result Value   Sodium 133 (*)    Chloride 97 (*)    Glucose, Bld 121 (*)    All other components within normal limits  CBC - Abnormal; Notable for the following components:   WBC 13.4 (*)    All other components within normal limits  URINALYSIS, ROUTINE W REFLEX MICROSCOPIC - Abnormal; Notable for the following components:   APPearance HAZY (*)    Hgb urine dipstick SMALL (*)    Nitrite POSITIVE (*)    Leukocytes,Ua MODERATE (*)    WBC, UA >50 (*)    Bacteria, UA MANY (*)    All other components within normal limits  URINE CULTURE  CULTURE, BLOOD (ROUTINE X 2)  CULTURE, BLOOD (ROUTINE X 2)  GASTROINTESTINAL PANEL BY PCR, STOOL (REPLACES STOOL CULTURE)  C DIFFICILE QUICK SCREEN W PCR REFLEX  SARS  CORONAVIRUS 2 BY RT PCR (HOSPITAL ORDER, [place] [facility])  LIPASE, BLOOD    EKG None  Radiology CT ABDOMEN PELVIS W CONTRAST  Result Date: 12/13/2019 CLINICAL DATA:  Right lower quadrant pain. EXAM: CT ABDOMEN AND PELVIS WITH CONTRAST TECHNIQUE: Multidetector CT imaging of the abdomen and pelvis was performed using the standard protocol following bolus administration of intravenous contrast. CONTRAST:  133m OMNIPAQUE IOHEXOL 300 MG/ML  SOLN COMPARISON:  July 08, 2019 FINDINGS: Lower chest: No acute abnormality. Hepatobiliary: A very mild amount of branching air density is seen within the anterior aspect of the right lobe of the liver. No gallstones, gallbladder wall thickening, or biliary dilatation. Pancreas: Unremarkable. No pancreatic ductal dilatation or surrounding inflammatory changes. Spleen: Normal in size without focal abnormality. Adrenals/Urinary Tract: Adrenal glands are unremarkable. Kidneys are normal, without renal calculi, focal lesion, or hydronephrosis. Bladder is unremarkable. Stomach/Bowel: Stomach is within normal limits. Appendix appears normal. There is moderate to marked severity thickening of the cecum and proximal ascending colon. A mild amount of pericolonic inflammatory fat stranding is noted. No evidence of bowel dilatation. Noninflamed diverticula are seen within the sigmoid colon. Vascular/Lymphatic: There is marked severity calcification of the abdominal aorta. No enlarged abdominal or pelvic lymph nodes. Reproductive: Status post hysterectomy. No adnexal masses. Other: No abdominal wall hernia or abnormality. No abdominopelvic ascites. Musculoskeletal: There is approximately 2 mm anterolisthesis of the L4 vertebral body on L5. Mild multilevel degenerative changes are also seen throughout the lumbar spine. IMPRESSION: 1. Moderate to marked severity thickening of the cecum and proximal ascending colon, consistent with infectious or inflammatory  colitis. 2. Noninflamed sigmoid diverticulosis. 3. Mild amount of branching air density within the right lobe of the liver. This represents a new finding when compared to the prior study. A mild amount of portal venous air cannot be excluded. Aortic Atherosclerosis (ICD10-I70.0). Electronically Signed   By: TVirgina NorfolkM.D.   On: 12/13/2019 23:00    Procedures Procedures (including critical care time)  Medications Ordered in ED  Medications  ciprofloxacin (CIPRO) IVPB 400 mg (400 mg Intravenous New Bag/Given 12/14/19 0021)  morphine 2 MG/ML injection 2 mg (2 mg Intravenous Given 12/13/19 2226)  iohexol (OMNIPAQUE) 300 MG/ML solution 100 mL (100 mLs Intravenous Contrast Given 12/13/19 2240)  metroNIDAZOLE (FLAGYL) tablet 500 mg (500 mg Oral Given 12/14/19 0020)  morphine 4 MG/ML injection 3 mg (3 mg Intravenous Given 12/14/19 0020)    ED Course  I have reviewed the triage vital signs and the nursing notes.  Pertinent labs & imaging results that were available during my care of the patient were reviewed by me and considered in my medical decision making (see chart for details).    MDM Rules/Calculators/A&P                     Patient here with sudden onset of periumbilical right lower quadrant pain this evening.  Pain is not associated with vomiting or diarrhea.  Pam [name] is well-appearing and nontoxic.  Vital signs reassuring and Pam [name] is afebrile.  With pain in the right lower quadrant Pam [name] will need further evaluation for possible appendicitis.  Also, giving prior cancer history a metastasis is also a consideration.  Will obtain CBC, c-Met, lipase, urinalysis, and CT abdomen/pelvis.  Patient has mildly elevated leukocytosis with WBC count of 13.4.  Lipase is reassuring and transaminases are within normal limits.  Her urinalysis is nitrite positive with moderate leukocytes and greater than 50 WBCs.  Patient does perform self catheterizations due to prior surgical injury of her  bladder.  Radiologist called to confirm CT finding of significant colitis involving the cecum and proximal ascending colon and possible portal venous air.   Consult to gastroenterology, Dr. Dereck Leep and discussed findings.  He recommends patient be started on Cipro and Flagyl, hospital admit and he will consult in the morning.  Discussed findings with patient and Pam [name] agrees to plan.   Consulted hospitalist, Dr. Humphrey Rolls and discussed findings.  He agrees to admit.  Final Clinical Impression(s) / ED Diagnoses Final diagnoses:  Right lower quadrant abdominal pain  Colitis    Rx / DC Orders ED Discharge Orders    None       Bufford Lope 12/14/19 0118    Milton Ferguson, MD 12/14/19 [phone]

## 2019-12-13 NOTE — ED Triage Notes (Signed)
Pt c/o right lower abdominal pain that started today at 330.

## 2019-12-14 ENCOUNTER — Observation Stay (HOSPITAL_COMMUNITY): Payer: Medicare Other

## 2019-12-14 ENCOUNTER — Inpatient Hospital Stay (HOSPITAL_COMMUNITY): Payer: Medicare Other

## 2019-12-14 ENCOUNTER — Encounter (HOSPITAL_COMMUNITY): Payer: Self-pay | Admitting: Internal Medicine

## 2019-12-14 DIAGNOSIS — N390 Urinary tract infection, site not specified: Secondary | ICD-10-CM | POA: Diagnosis present

## 2019-12-14 DIAGNOSIS — Z20822 Contact with and (suspected) exposure to covid-19: Secondary | ICD-10-CM | POA: Diagnosis present

## 2019-12-14 DIAGNOSIS — R109 Unspecified abdominal pain: Secondary | ICD-10-CM | POA: Diagnosis present

## 2019-12-14 DIAGNOSIS — E785 Hyperlipidemia, unspecified: Secondary | ICD-10-CM | POA: Diagnosis present

## 2019-12-14 DIAGNOSIS — K529 Noninfective gastroenteritis and colitis, unspecified: Secondary | ICD-10-CM | POA: Diagnosis not present

## 2019-12-14 DIAGNOSIS — R1031 Right lower quadrant pain: Secondary | ICD-10-CM | POA: Diagnosis not present

## 2019-12-14 DIAGNOSIS — T83511A Infection and inflammatory reaction due to indwelling urethral catheter, initial encounter: Secondary | ICD-10-CM | POA: Diagnosis present

## 2019-12-14 DIAGNOSIS — B9620 Unspecified Escherichia coli [E. coli] as the cause of diseases classified elsewhere: Secondary | ICD-10-CM | POA: Diagnosis present

## 2019-12-14 DIAGNOSIS — R509 Fever, unspecified: Secondary | ICD-10-CM | POA: Diagnosis not present

## 2019-12-14 DIAGNOSIS — Z1623 Resistance to quinolones and fluoroquinolones: Secondary | ICD-10-CM | POA: Diagnosis present

## 2019-12-14 DIAGNOSIS — M1990 Unspecified osteoarthritis, unspecified site: Secondary | ICD-10-CM | POA: Diagnosis present

## 2019-12-14 DIAGNOSIS — Z853 Personal history of malignant neoplasm of breast: Secondary | ICD-10-CM | POA: Diagnosis not present

## 2019-12-14 DIAGNOSIS — R339 Retention of urine, unspecified: Secondary | ICD-10-CM | POA: Diagnosis present

## 2019-12-14 DIAGNOSIS — Z8541 Personal history of malignant neoplasm of cervix uteri: Secondary | ICD-10-CM | POA: Diagnosis not present

## 2019-12-14 DIAGNOSIS — Z881 Allergy status to other antibiotic agents status: Secondary | ICD-10-CM | POA: Diagnosis not present

## 2019-12-14 DIAGNOSIS — Z9012 Acquired absence of left breast and nipple: Secondary | ICD-10-CM | POA: Diagnosis not present

## 2019-12-14 DIAGNOSIS — Z87891 Personal history of nicotine dependence: Secondary | ICD-10-CM | POA: Diagnosis not present

## 2019-12-14 DIAGNOSIS — G2581 Restless legs syndrome: Secondary | ICD-10-CM | POA: Diagnosis present

## 2019-12-14 DIAGNOSIS — Z880 Allergy status to penicillin: Secondary | ICD-10-CM | POA: Diagnosis not present

## 2019-12-14 DIAGNOSIS — E876 Hypokalemia: Secondary | ICD-10-CM | POA: Diagnosis not present

## 2019-12-14 DIAGNOSIS — Z803 Family history of malignant neoplasm of breast: Secondary | ICD-10-CM | POA: Diagnosis not present

## 2019-12-14 DIAGNOSIS — Z886 Allergy status to analgesic agent status: Secondary | ICD-10-CM | POA: Diagnosis not present

## 2019-12-14 DIAGNOSIS — Z79899 Other long term (current) drug therapy: Secondary | ICD-10-CM | POA: Diagnosis not present

## 2019-12-14 DIAGNOSIS — I480 Paroxysmal atrial fibrillation: Secondary | ICD-10-CM | POA: Diagnosis present

## 2019-12-14 DIAGNOSIS — I4819 Other persistent atrial fibrillation: Secondary | ICD-10-CM | POA: Diagnosis present

## 2019-12-14 DIAGNOSIS — M109 Gout, unspecified: Secondary | ICD-10-CM | POA: Diagnosis present

## 2019-12-14 DIAGNOSIS — Z7901 Long term (current) use of anticoagulants: Secondary | ICD-10-CM | POA: Diagnosis not present

## 2019-12-14 DIAGNOSIS — A09 Infectious gastroenteritis and colitis, unspecified: Secondary | ICD-10-CM | POA: Diagnosis present

## 2019-12-14 LAB — COMPREHENSIVE METABOLIC PANEL
ALT: 15 U/L (ref 0–44)
AST: 18 U/L (ref 15–41)
Albumin: 3.7 g/dL (ref 3.5–5.0)
Alkaline Phosphatase: 79 U/L (ref 38–126)
Anion gap: 11 (ref 5–15)
BUN: 16 mg/dL (ref 8–23)
CO2: 27 mmol/L (ref 22–32)
Calcium: 8.8 mg/dL — ABNORMAL LOW (ref 8.9–10.3)
Chloride: 94 mmol/L — ABNORMAL LOW (ref 98–111)
Creatinine, Ser: 0.7 mg/dL (ref 0.44–1.00)
GFR calc Af Amer: >60 mL/min (ref >60)
GFR calc non Af Amer: >60 mL/min (ref >60)
Glucose, Bld: 128 mg/dL — ABNORMAL HIGH (ref 70–99)
Potassium: 4.4 mmol/L (ref 3.5–5.1)
Sodium: 132 mmol/L — ABNORMAL LOW (ref 135–145)
Total Bilirubin: 0.7 mg/dL (ref 0.3–1.2)
Total Protein: 6.4 g/dL — ABNORMAL LOW (ref 6.5–8.1)

## 2019-12-14 LAB — CBC
HCT: 40.2 % (ref 36.0–46.0)
Hemoglobin: 12.7 g/dL (ref 12.0–15.0)
MCH: 27.3 pg (ref 26.0–34.0)
MCHC: 31.6 g/dL (ref 30.0–36.0)
MCV: 86.3 fL (ref 80.0–100.0)
Platelets: 281 10*3/uL (ref 150–400)
RBC: 4.66 MIL/uL (ref 3.87–5.11)
RDW: 15.1 % (ref 11.5–15.5)
WBC: 19 10*3/uL — ABNORMAL HIGH (ref 4.0–10.5)
nRBC: 0 % (ref 0.0–0.2)

## 2019-12-14 LAB — PROTIME-INR
INR: 2.1 — ABNORMAL HIGH (ref 0.8–1.2)
Prothrombin Time: 23 seconds — ABNORMAL HIGH (ref 11.4–15.2)

## 2019-12-14 LAB — SARS CORONAVIRUS 2 BY RT PCR (HOSPITAL ORDER, PERFORMED IN ~~LOC~~ HOSPITAL LAB): SARS Coronavirus 2: NEGATIVE

## 2019-12-14 MED ORDER — WARFARIN SODIUM 5 MG PO TABS
5.0000 mg | ORAL_TABLET | Freq: Once | ORAL | Status: AC
  Administered 2019-12-14: 5 mg via ORAL
  Filled 2019-12-14: qty 1

## 2019-12-14 MED ORDER — METOPROLOL TARTRATE 5 MG/5ML IV SOLN: 5.0000 mg | Freq: Four times a day (QID) | INTRAVENOUS | Status: DC | PRN

## 2019-12-14 MED ORDER — ROPINIROLE HCL 1 MG PO TABS
1.0000 mg | ORAL_TABLET | Freq: Every day | ORAL | Status: DC
  Administered 2019-12-14 – 2019-12-15 (×2): 1 mg via ORAL
  Filled 2019-12-14 (×2): qty 1

## 2019-12-14 MED ORDER — ONDANSETRON HCL 4 MG/2ML IJ SOLN: 4.0000 mg | Freq: Four times a day (QID) | INTRAMUSCULAR | Status: DC | PRN

## 2019-12-14 MED ORDER — WARFARIN - PHARMACIST DOSING INPATIENT: Freq: Every day | Status: DC

## 2019-12-14 MED ORDER — METRONIDAZOLE IN NACL 5-0.79 MG/ML-% IV SOLN
500.0000 mg | Freq: Three times a day (TID) | INTRAVENOUS | Status: DC
  Administered 2019-12-14 – 2019-12-16 (×7): 500 mg via INTRAVENOUS
  Filled 2019-12-14 (×7): qty 100

## 2019-12-14 MED ORDER — CIPROFLOXACIN IN D5W 400 MG/200ML IV SOLN
400.0000 mg | Freq: Two times a day (BID) | INTRAVENOUS | Status: DC
  Administered 2019-12-14 – 2019-12-16 (×5): 400 mg via INTRAVENOUS
  Filled 2019-12-14 (×5): qty 200

## 2019-12-14 MED ORDER — METOPROLOL TARTRATE 25 MG PO TABS
25.0000 mg | ORAL_TABLET | Freq: Two times a day (BID) | ORAL | Status: DC
  Administered 2019-12-14 – 2019-12-15 (×4): 25 mg via ORAL
  Filled 2019-12-14 (×5): qty 1

## 2019-12-14 MED ORDER — DILTIAZEM HCL 25 MG/5ML IV SOLN
10.0000 mg | Freq: Once | INTRAVENOUS | Status: AC
  Administered 2019-12-14: 10 mg via INTRAVENOUS
  Filled 2019-12-14: qty 5

## 2019-12-14 MED ORDER — SODIUM CHLORIDE 0.9 % IV SOLN
INTRAVENOUS | Status: DC | PRN
  Administered 2019-12-14: 250 mL via INTRAVENOUS

## 2019-12-14 MED ORDER — WARFARIN SODIUM 5 MG PO TABS: 5.0000 mg | ORAL_TABLET | ORAL | Status: DC

## 2019-12-14 MED ORDER — MORPHINE SULFATE (PF) 4 MG/ML IV SOLN
3.0000 mg | Freq: Once | INTRAVENOUS | Status: AC
  Administered 2019-12-14: 3 mg via INTRAVENOUS
  Filled 2019-12-14: qty 1

## 2019-12-14 MED ORDER — ONDANSETRON HCL 4 MG PO TABS: 4.0000 mg | ORAL_TABLET | Freq: Four times a day (QID) | ORAL | Status: DC | PRN

## 2019-12-14 MED ORDER — ACETAMINOPHEN 325 MG PO TABS: 650.0000 mg | ORAL_TABLET | Freq: Four times a day (QID) | ORAL | Status: DC | PRN

## 2019-12-14 MED ORDER — DILTIAZEM HCL ER COATED BEADS 180 MG PO CP24
360.0000 mg | ORAL_CAPSULE | Freq: Every day | ORAL | Status: DC
  Administered 2019-12-14 – 2019-12-16 (×3): 360 mg via ORAL
  Filled 2019-12-14 (×3): qty 2

## 2019-12-14 MED ORDER — ACETAMINOPHEN 650 MG RE SUPP
650.0000 mg | Freq: Four times a day (QID) | RECTAL | Status: DC | PRN
  Administered 2019-12-14: 650 mg via RECTAL
  Filled 2019-12-14: qty 1

## 2019-12-14 MED ORDER — IOHEXOL 350 MG/ML SOLN
100.0000 mL | Freq: Once | INTRAVENOUS | Status: AC | PRN
  Administered 2019-12-14: 100 mL via INTRAVENOUS

## 2019-12-14 MED ORDER — MORPHINE SULFATE (PF) 2 MG/ML IV SOLN
2.0000 mg | INTRAVENOUS | Status: DC | PRN
  Administered 2019-12-14 – 2019-12-15 (×5): 2 mg via INTRAVENOUS
  Filled 2019-12-14 (×6): qty 1

## 2019-12-14 MED ORDER — GABAPENTIN 100 MG PO CAPS
100.0000 mg | ORAL_CAPSULE | Freq: Every day | ORAL | Status: DC
  Administered 2019-12-14 – 2019-12-15 (×2): 100 mg via ORAL
  Filled 2019-12-14 (×2): qty 1

## 2019-12-14 NOTE — Progress Notes (Signed)
CTA reviewed. Celiac, SMA, IMA all patent. No bowel obstruction. No evidence for occlusive disease. Further findings as per report. Discussed with Dr. Manuella Ghazi. Notified daughter, Abigail Butts. Continue with empiric antibiotics, supportive measures.

## 2019-12-14 NOTE — Consult Note (Signed)
Referring Provider: [facility], Kem Parkinson, PA-C Primary Care Physician:  Neale Burly, MD Primary Gastroenterologist:  Dr.  Date of Admission: 12/13/19 Date of Consultation: 12/14/19  Reason for Consultation:  Colitis   HPI:  Pam [name] is a 79 y.o. year old female presenting to the ED yesterday after acute onset of periumbilical pain radiating down to RLQ, with CT findings of moderate to marked thickening of cecum and proximal ascending colon, mild amount of branching air density within the right lobe of liver. History of afib on chronic Coumadin. Admitting WBC count 13.4, increased to 19.0 this morning. Temp 100.4 this morning.   Pam [name], daughter present. Patient states acute onset of pain out of the blue yesterday about 330pm. Associated nausea. Felt hot and clammy. HR was in the 90s but fluctuating when she checked it. Pain was periumbilical to RLQ. Pain was sharp and constant. Hurt worse with movement. No diarrhea. No rectal bleeding. Feels like pain is improved somewhat but still persists intermittently. Worsened with movement. No prior episodes. Appetite has been good. Lost weight during total knee replacement. No diarrhea. Takes stool softener daily. Some jello and frozen pops but no significant pain with eating that she has noticed. No nausea now. No NSAIDs. On Coumadin for afib.   Colonoscopy in [place] about 10 years ago. No polyps. Has to self-catheterize since 2016 s/p hysterectomy.   Reviewed CT findings with Dr. Thornton Papas, specifically mesenteric vasculature. From CT with contrast, estimated that celiac approximately less than 50% stenosed at origin, SMA likely greater than 50%, IMA with plaque but patent, no mesenteric venous thrombus, definite vascular disease.   Past Medical History:  Diagnosis Date  . Arthritis   . Atrial fibrillation ([place])   . Cancer [facility])    BREAST CANCER / CERVICAL CANCER   . Cervical cancer ([place])   . Dysrhythmia   . History of breast cancer  2006   Dr. Sonny Dandy oncologist - left breast  . History of gout   . Hyperlipidemia   . Urinary retention Sept 2016   s/p hysterectomy    Past Surgical History:  Procedure Laterality Date  . BREAST SURGERY    . CARDIOVERSION N/A 06/03/2019   Procedure: CARDIOVERSION;  Surgeon: Jerline Pain, MD;  Location: [facility] ENDOSCOPY;  Service: Cardiovascular;  Laterality: N/A;  . CATARACT EXTRACTION W/ INTRAOCULAR LENS  IMPLANT, BILATERAL    . EYE SURGERY    . INGUINAL HERNIA REPAIR Right 12/14/2015   Procedure: LAPAROSCOPIC AND OPEN RIGHT  INGUINAL HERNIA;  Surgeon: Johnathan Hausen, MD;  Location: [facility] ORS;  Service: General;  Laterality: Right;  . KNEE ARTHROSCOPY Left   . MASTECTOMY Left 2006  . MASTECTOMY WITH AXILLARY LYMPH NODE DISSECTION Left   . ROBOTIC ASSISTED TOTAL HYSTERECTOMY WITH BILATERAL SALPINGO OOPHERECTOMY Bilateral 04/07/2015   Procedure: ROBOTIC ASSISTED RADICAL HYSTERECTOMY BILATERAL SALPINGO OOPHORECTOMY BILATERAL SENTINEL LYMPHADENETOMY;  Surgeon: Everitt Amber, MD;  Location: [facility] ORS;  Service: Gynecology;  Laterality: Bilateral;  . TOTAL KNEE ARTHROPLASTY Left 07/13/2019   Procedure: LEFT TOTAL KNEE ARTHROPLASTY-CEMENTED;  Surgeon: Marybelle Killings, MD;  Location: [facility];  Service: Orthopedics;  Laterality: Left;  . TUBAL LIGATION      Prior to Admission medications   Medication Sig Start Date End Date Taking? Authorizing Provider  bisacodyl (DULCOLAX) 5 MG EC tablet Take 5 mg by mouth daily.   Yes [provider]  diltiazem (CARDIZEM CD) 360 MG 24 hr capsule Take 1 capsule (360 mg total) by mouth daily. 06/08/19  12/13/19 Yes Minus Breeding, MD  fexofenadine (ALLEGRA) 180 MG tablet Take 180 mg by mouth daily.   Yes [provider]  gabapentin (NEURONTIN) 100 MG capsule Take 100 mg by mouth at bedtime. 04/24/17  Yes [provider]  metoprolol tartrate (LOPRESSOR) 25 MG tablet Take 1 tablet (25 mg total) by mouth 2 (two) times daily. 07/02/19 06/26/20 Yes Hochrein,  Jeneen Rinks, MD  rOPINIRole (REQUIP) 1 MG tablet Take 1 mg by mouth at bedtime. 04/05/17  Yes [provider]  traMADol (ULTRAM) 50 MG tablet Take 50 mg by mouth 2 (two) times daily as needed for moderate pain.  11/04/19  Yes [provider]  warfarin (COUMADIN) 5 MG tablet Take 5-7.5 mg by mouth See admin instructions. Take 5mg  daily in the morning. Take 7.5mg  on Tuesdays and Thursdays   Yes [provider]    Current Facility-Administered Medications  Medication Dose Route Frequency Provider Last Rate Last Admin  . 0.9 %  sodium chloride infusion   Intravenous PRN Heath Lark D, DO 10 mL/hr at 12/14/19 1057 250 mL at 12/14/19 1057  . acetaminophen (TYLENOL) tablet 650 mg  650 mg Oral Q6H PRN Bunnie Pion Z, DO       Or  . acetaminophen (TYLENOL) suppository 650 mg  650 mg Rectal Q6H PRN Bunnie Pion Z, DO      . ciprofloxacin (CIPRO) IVPB 400 mg  400 mg Intravenous Q12H Bunnie Pion Z, DO 200 mL/hr at 12/14/19 1100 400 mg at 12/14/19 1100  . diltiazem (CARDIZEM CD) 24 hr capsule 360 mg  360 mg Oral Daily Bunnie Pion Z, DO   360 mg at 12/14/19 1048  . gabapentin (NEURONTIN) capsule 100 mg  100 mg Oral QHS Bunnie Pion Z, DO      . metoprolol tartrate (LOPRESSOR) injection 5 mg  5 mg Intravenous Q6H PRN Manuella Ghazi, Pratik D, DO      . metoprolol tartrate (LOPRESSOR) tablet 25 mg  25 mg Oral BID Bunnie Pion Z, DO   25 mg at 12/14/19 1048  . metroNIDAZOLE (FLAGYL) IVPB 500 mg  500 mg Intravenous Q8H Bunnie Pion Z, DO 100 mL/hr at 12/14/19 0539 500 mg at 12/14/19 0539  . morphine 2 MG/ML injection 2 mg  2 mg Intravenous Q4H PRN Bunnie Pion Z, DO   2 mg at 12/14/19 1044  . ondansetron (ZOFRAN) tablet 4 mg  4 mg Oral Q6H PRN Bunnie Pion Z, DO       Or  . ondansetron [facility]) injection 4 mg  4 mg Intravenous Q6H PRN Bunnie Pion Z, DO      . rOPINIRole (REQUIP) tablet 1 mg  1 mg Oral QHS Bunnie Pion Z, DO      . warfarin (COUMADIN) tablet 5-7.5 mg  5-7.5 mg  Oral See admin instructions Bunnie Pion Z, DO        Allergies as of 12/13/2019 - Review Complete 12/13/2019  Allergen Reaction Noted  . Aspirin Nausea And Vomiting 03/23/2015  . Ciprofloxacin Diarrhea 04/03/2014  . Flagyl [metronidazole] Diarrhea 04/03/2014  . Penicillins Hives and Other (See Comments) 04/03/2014    Family History  Problem Relation Age of Onset  . Prostate cancer Father   . Heart failure Father        47s  . Heart failure Mother        68  . Breast cancer Sister   . Colon polyps Sister        early 27s   . Colon cancer  Neg Hx     Social History   Socioeconomic History  . Marital status: Married    Spouse name: Not on file  . Number of children: 1  . Years of education: Not on file  . Highest education level: Not on file  Occupational History  . Not on file  Tobacco Use  . Smoking status: Former Smoker    Packs/day: 1.00    Years: 30.00    Pack years: 30.00    Types: Cigarettes    Quit date: 07/23/1994    Years since quitting: 25.4  . Smokeless tobacco: Never Used  Substance and Sexual Activity  . Alcohol use: Never    Alcohol/week: 0.0 standard drinks  . Drug use: Never  . Sexual activity: Not Currently  Other Topics Concern  . Not on file  Social History Narrative   One daughter.  Lives with husband.     Social Determinants of Health   Financial Resource Strain:   . Difficulty of Paying Living Expenses:   Food Insecurity:   . Worried About Charity fundraiser in the Last Year:   . Arboriculturist in the Last Year:   Transportation Needs:   . Film/video editor (Medical):   Marland Kitchen Lack of Transportation (Non-Medical):   Physical Activity:   . Days of Exercise per Week:   . Minutes of Exercise per Session:   Stress:   . Feeling of Stress :   Social Connections:   . Frequency of Communication with Friends and Family:   . Frequency of Social Gatherings with Friends and Family:   . Attends Religious Services:   . Active Member of  Clubs or Organizations:   . Attends Archivist Meetings:   Marland Kitchen Marital Status:   Intimate Partner Violence:   . Fear of Current or Ex-Partner:   . Emotionally Abused:   Marland Kitchen Physically Abused:   . Sexually Abused:     Review of Systems: Gen: see HPI  CV: Denies chest pain, heart palpitations, syncope, edema  Resp: Denies shortness of breath with rest, cough, wheezing GI: see HPI GU : Denies urinary burning, urinary frequency, urinary incontinence.  MS: left knee swelling since surgery  Derm: Denies rash, itching, dry skin Psych: Denies depression, anxiety,confusion, or memory loss Heme: Denies bruising, bleeding, and enlarged lymph nodes.  Physical Exam: Vital signs in last 24 hours: Temp:  [97.4 F (36.3 C)-100.4 F (38 C)] 100.4 F (38 C) (05/24 0827) Pulse Rate:  [72-102] 88 (05/24 0827) Resp:  [18-22] 18 (05/24 0827) BP: (120-137)/(57-73) 120/57 (05/24 0827) SpO2:  [94 %-100 %] 96 % (05/24 0827) Weight:  [58.1 kg-58.3 kg] 58.3 kg (05/24 0400) Last BM Date: 12/14/19 General:   Alert,  Well-developed, well-nourished, pleasant and cooperative in NAD Head:  Normocephalic and atraumatic. Eyes:  Sclera clear, no icterus.   Conjunctiva pink. Ears:  Normal auditory acuity. Nose:  No deformity, discharge,  or lesions. Mouth:  No deformity or lesions Lungs:  Clear throughout to auscultation.   Heart:  S1 S2 present, irregularly irregular, low 100s.  Abdomen:  Soft, moderately TTP periumbilical and RLQ, non-distended. No rebound or guarding. Positive BS, active.  Rectal:  Deferred  Msk:  Symmetrical without gross deformities. Normal posture. Extremities:  With left knee edema, chronic since surgery, mild left ankle edema Neurologic:  Alert and  oriented x4;  grossly normal neurologically. Psych:  Alert and cooperative. Normal mood and affect.  Intake/Output from previous day: 05/23 0701 -  05/24 0700 In: 192 [IV Piggyback:192] Out: -  Intake/Output this shift: No  intake/output data recorded.  Lab Results: Recent Labs    12/13/19 2104 12/14/19 0532  WBC 13.4* 19.0*  HGB 13.2 12.7  HCT 41.0 40.2  PLT 315 281   BMET Recent Labs    12/13/19 2104 12/14/19 0532  NA 133* 132*  K 4.3 4.4  CL 97* 94*  CO2 26 27  GLUCOSE 121* 128*  BUN 21 16  CREATININE 0.69 0.70  CALCIUM 9.1 8.8*   LFT Recent Labs    12/13/19 2104 12/14/19 0532  PROT 6.7 6.4*  ALBUMIN 4.2 3.7  AST 16 18  ALT 15 15  ALKPHOS 94 79  BILITOT 0.6 0.7    Studies/Results: CT ABDOMEN PELVIS W CONTRAST  Result Date: 12/13/2019 CLINICAL DATA:  Right lower quadrant pain. EXAM: CT ABDOMEN AND PELVIS WITH CONTRAST TECHNIQUE: Multidetector CT imaging of the abdomen and pelvis was performed using the standard protocol following bolus administration of intravenous contrast. CONTRAST:  145mL OMNIPAQUE IOHEXOL 300 MG/ML  SOLN COMPARISON:  July 08, 2019 FINDINGS: Lower chest: No acute abnormality. Hepatobiliary: A very mild amount of branching air density is seen within the anterior aspect of the right lobe of the liver. No gallstones, gallbladder wall thickening, or biliary dilatation. Pancreas: Unremarkable. No pancreatic ductal dilatation or surrounding inflammatory changes. Spleen: Normal in size without focal abnormality. Adrenals/Urinary Tract: Adrenal glands are unremarkable. Kidneys are normal, without renal calculi, focal lesion, or hydronephrosis. Bladder is unremarkable. Stomach/Bowel: Stomach is within normal limits. Appendix appears normal. There is moderate to marked severity thickening of the cecum and proximal ascending colon. A mild amount of pericolonic inflammatory fat stranding is noted. No evidence of bowel dilatation. Noninflamed diverticula are seen within the sigmoid colon. Vascular/Lymphatic: There is marked severity calcification of the abdominal aorta. No enlarged abdominal or pelvic lymph nodes. Reproductive: Status post hysterectomy. No adnexal masses. Other: No  abdominal wall hernia or abnormality. No abdominopelvic ascites. Musculoskeletal: There is approximately 2 mm anterolisthesis of the L4 vertebral body on L5. Mild multilevel degenerative changes are also seen throughout the lumbar spine. IMPRESSION: 1. Moderate to marked severity thickening of the cecum and proximal ascending colon, consistent with infectious or inflammatory colitis. 2. Noninflamed sigmoid diverticulosis. 3. Mild amount of branching air density within the right lobe of the liver. This represents a new finding when compared to the prior study. A mild amount of portal venous air cannot be excluded. Aortic Atherosclerosis (ICD10-I70.0). Electronically Signed   By: Virgina Norfolk M.D.   On: 12/13/2019 23:00    Impression: 79 year old female admitted with acute abdominal pain, with CT findings of right-sided colitis. No diarrhea reported. WBC count has increased today, temp 100.4 this morning. She notes mild improvement in pain overall but still persisting, and she is quite tender on exam. Clinical scenario concerning for ischemic etiology. CT reviewed with Dr. Thornton Papas, and definite vascular disease noted. With increasing leukocytosis, fever, and lingering pain that has been slow to improve, will pursue CTA stat. Discussed this with Dr. Gala Romney, patient, and patient's daughter.   Plan: CTA stat: called radiology to inform Continue with empiric antibiotics for now NPO Further recommendations once CTA reviewed   Annitta Needs, PhD, ANP-BC [facility]      LOS: 0 days    12/14/2019, 11:50 AM

## 2019-12-14 NOTE — H&P (Signed)
History and Physical    Pam [name] [id number] DOB: [birth year] DOA: 12/13/2019  PCP: Neale Burly, MD (Confirm with patient/family/NH records and if not entered, this has to be entered at [facility] point of entry) Patient coming from: Home  I have personally briefly reviewed patient's old medical records in [place]  Chief Complaint: Intractable abdominal pain  HPI: Pam [name] is a 79 y.o. female with medical history significant of atrial fibrillation on warfarin, restless leg syndrome, prior breast and cervical cancer and urinary retention requiring frequent self-catheterizations presented to ED for evaluation of right lower quadrant abdominal pain.  Patient states that pain started suddenly around 3 PM this evening and continue to worsen.  Pain is crampy and colicky in nature, 8 out of 10 on pain scale, nonradiating, constant and nothing makes it better or worse.  Patient denies fever, chills, nausea, vomiting, constipation, diarrhea and dysuria.  Patient also denies chest pain, shortness of breath and back pain.  Patient reported that she is following up with gynecologic oncology on regular basis but she is not currently on chemotherapy or radiation.  ED Course: On arrival to ED patient had blood pressure 133/68, heart rate 96, respiratory rate 18, temperature 98.7 and oxygen saturation 94% on room air.  Blood work showed leukocytosis with WBC count of 13.4, hemoglobin 13.2, sodium 133, potassium 4.3, BUN 21, creatinine 0.6, blood glucose 121.  UA positive for UTI.  CT abdomen/pelvis showed colitis of caecum and proximal ascending colon and mild amount of air in right lobe of liver and portal venous system.  ED physician contacted Dr. Rehman/gastroenterology who recommended to start the patient on ciprofloxacin and metronidazole and admit the patient and he will see the patient in the morning.  Review of Systems: As per HPI otherwise 10 point review of systems negative.    Unacceptable ROS statements: "10 systems reviewed," "Extensive" (without elaboration).  Acceptable ROS statements: "All others negative," "All others reviewed and are negative," and "All others unremarkable," with at [place] documented Can't double dip - if using for HPI can't use for ROS  Past Medical History:  Diagnosis Date  . Arthritis   . Atrial fibrillation ([place])   . Cancer [facility])    BREAST CANCER / CERVICAL CANCER   . Cervical cancer ([place])   . Dysrhythmia   . History of breast cancer 2006   Dr. Sonny Dandy oncologist - left breast  . History of gout   . Hyperlipidemia   . Urinary retention Sept 2016   s/p hysterectomy    Past Surgical History:  Procedure Laterality Date  . BREAST SURGERY    . CARDIOVERSION N/A 06/03/2019   Procedure: CARDIOVERSION;  Surgeon: Jerline Pain, MD;  Location: [facility] ENDOSCOPY;  Service: Cardiovascular;  Laterality: N/A;  . CATARACT EXTRACTION W/ INTRAOCULAR LENS  IMPLANT, BILATERAL    . EYE SURGERY    . INGUINAL HERNIA REPAIR Right 12/14/2015   Procedure: LAPAROSCOPIC AND OPEN RIGHT  INGUINAL HERNIA;  Surgeon: Johnathan Hausen, MD;  Location: [facility] ORS;  Service: General;  Laterality: Right;  . KNEE ARTHROSCOPY Left   . MASTECTOMY Left 2006  . MASTECTOMY WITH AXILLARY LYMPH NODE DISSECTION Left   . ROBOTIC ASSISTED TOTAL HYSTERECTOMY WITH BILATERAL SALPINGO OOPHERECTOMY Bilateral 04/07/2015   Procedure: ROBOTIC ASSISTED RADICAL HYSTERECTOMY BILATERAL SALPINGO OOPHORECTOMY BILATERAL SENTINEL LYMPHADENETOMY;  Surgeon: Everitt Amber, MD;  Location: [facility] ORS;  Service: Gynecology;  Laterality: Bilateral;  . TOTAL KNEE ARTHROPLASTY Left 07/13/2019   Procedure: LEFT  TOTAL KNEE ARTHROPLASTY-CEMENTED;  Surgeon: Marybelle Killings, MD;  Location: [place];  Service: Orthopedics;  Laterality: Left;  . TUBAL LIGATION       reports that she quit smoking about 25 years ago. Her smoking use included cigarettes. She has a 30.00 pack-year smoking history. She has never used  smokeless tobacco. She reports that she does not drink alcohol or use drugs.  Allergies  Allergen Reactions  . Aspirin Nausea And Vomiting  . Ciprofloxacin Diarrhea  . Flagyl [Metronidazole] Diarrhea  . Penicillins Hives and Other (See Comments)         Family History  Problem Relation Age of Onset  . Prostate cancer Father   . Heart failure Father        7s  . Heart failure Mother        61  . Breast cancer Sister    Unacceptable: Noncontributory, unremarkable, or negative. Acceptable: (example)Family history negative for heart disease  Prior to Admission medications   Medication Sig Start Date End Date Taking? Authorizing Provider  bisacodyl (DULCOLAX) 5 MG EC tablet Take 5 mg by mouth daily.   Yes [provider]  diltiazem (CARDIZEM CD) 360 MG 24 hr capsule Take 1 capsule (360 mg total) by mouth daily. 06/08/19 12/13/19 Yes Minus Breeding, MD  fexofenadine (ALLEGRA) 180 MG tablet Take 180 mg by mouth daily.   Yes [provider]  gabapentin (NEURONTIN) 100 MG capsule Take 100 mg by mouth at bedtime. 04/24/17  Yes [provider]  metoprolol tartrate (LOPRESSOR) 25 MG tablet Take 1 tablet (25 mg total) by mouth 2 (two) times daily. 07/02/19 06/26/20 Yes Hochrein, Jeneen Rinks, MD  rOPINIRole (REQUIP) 1 MG tablet Take 1 mg by mouth at bedtime. 04/05/17  Yes [provider]  traMADol (ULTRAM) 50 MG tablet Take 50 mg by mouth 2 (two) times daily as needed for moderate pain.  11/04/19  Yes [provider]  warfarin (COUMADIN) 5 MG tablet Take 5-7.5 mg by mouth See admin instructions. Take 5mg  daily in the morning. Take 7.5mg  on Tuesdays and Thursdays   Yes [provider]    Physical Exam: Vitals:   12/13/19 1948 12/13/19 2228 12/14/19 0400 12/14/19 0434  BP: 137/73 133/68  127/65  Pulse: 72 96  (!) 102  Resp: (!) 22 18  18   Temp: 98.7 F (37.1 C)   (!) 97.4 F (36.3 C)  TempSrc: Oral   Oral  SpO2: 98% 94%  100%  Weight:   58.3  kg   Height:   5\' 3"  (1.6 m)     Constitutional: NAD, calm, comfortable Vitals:   12/13/19 1948 12/13/19 2228 12/14/19 0400 12/14/19 0434  BP: 137/73 133/68  127/65  Pulse: 72 96  (!) 102  Resp: (!) 22 18  18   Temp: 98.7 F (37.1 C)   (!) 97.4 F (36.3 C)  TempSrc: Oral   Oral  SpO2: 98% 94%  100%  Weight:   58.3 kg   Height:   5\' 3"  (1.6 m)    General: 78-year-Caucasian female in mild distress secondary to abdominal pain Eyes: PERRL, lids and conjunctivae normal ENMT: Mucous membranes are moist. Posterior pharynx clear of any exudate or lesions.Normal dentition.  Neck: normal, supple, no masses, no thyromegaly Respiratory: clear to auscultation bilaterally, no wheezing, no crackles. Normal respiratory effort. No accessory muscle use.  Cardiovascular: Regular rate and rhythm, no murmurs / rubs / gallops. No extremity edema. 2+ pedal pulses. No carotid bruits.  Abdomen:  Abdomen is soft, nondistended but positive for tenderness in right lower quadrant. No masses palpated. No hepatosplenomegaly. Bowel sounds positive.  Musculoskeletal: no clubbing / cyanosis. No joint deformity upper and lower extremities. Good ROM, no contractures. Normal muscle tone.  Skin: no rashes, lesions, ulcers. No induration Neurologic: CN 2-12 grossly intact. Sensation intact, DTR normal. Strength 5/5 in all 4.  Psychiatric: Normal judgment and insight. Alert and oriented x 3. Normal mood.   (Anything < 9 systems with 2 bullets each down codes to level 1) (If patient refuses exam can't bill higher level) (Make sure to document decubitus ulcers present on admission -- if possible -- and whether patient has chronic indwelling catheter at time of admission)  Labs on Admission: I have personally reviewed following labs and imaging studies  CBC: Recent Labs  Lab 12/13/19 2104  WBC 13.4*  HGB 13.2  HCT 41.0  MCV 86.0  PLT 123456   Basic Metabolic Panel: Recent Labs  Lab 12/13/19 2104  NA 133*  K 4.3    CL 97*  CO2 26  GLUCOSE 121*  BUN 21  CREATININE 0.69  CALCIUM 9.1   GFR: Estimated Creatinine Clearance: 49.5 mL/min (by C-G formula based on SCr of 0.69 mg/dL). Liver Function Tests: Recent Labs  Lab 12/13/19 2104  AST 16  ALT 15  ALKPHOS 94  BILITOT 0.6  PROT 6.7  ALBUMIN 4.2   Recent Labs  Lab 12/13/19 2104  LIPASE 17   No results for input(s): AMMONIA in the last 168 hours. Coagulation Profile: No results for input(s): INR, PROTIME in the last 168 hours. Cardiac Enzymes: No results for input(s): CKTOTAL, CKMB, CKMBINDEX, TROPONINI in the last 168 hours. BNP (last 3 results) No results for input(s): PROBNP in the last 8760 hours. HbA1C: No results for input(s): HGBA1C in the last 72 hours. CBG: No results for input(s): GLUCAP in the last 168 hours. Lipid Profile: No results for input(s): CHOL, HDL, LDLCALC, TRIG, CHOLHDL, LDLDIRECT in the last 72 hours. Thyroid Function Tests: No results for input(s): TSH, T4TOTAL, FREET4, T3FREE, THYROIDAB in the last 72 hours. Anemia Panel: No results for input(s): VITAMINB12, FOLATE, FERRITIN, TIBC, IRON, RETICCTPCT in the last 72 hours. Urine analysis:    Component Value Date/Time   COLORURINE YELLOW 12/13/2019 2144   APPEARANCEUR HAZY (A) 12/13/2019 2144   LABSPEC 1.016 12/13/2019 2144   LABSPEC 1.005 04/15/2015 1203   PHURINE 5.0 12/13/2019 2144   GLUCOSEU NEGATIVE 12/13/2019 2144   GLUCOSEU Negative 04/15/2015 1203   HGBUR SMALL (A) 12/13/2019 2144   BILIRUBINUR NEGATIVE 12/13/2019 2144   BILIRUBINUR Negative 04/15/2015 [place] 12/13/2019 2144   PROTEINUR NEGATIVE 12/13/2019 2144   UROBILINOGEN 0.2 04/15/2015 1203   NITRITE POSITIVE (A) 12/13/2019 2144   LEUKOCYTESUR MODERATE (A) 12/13/2019 2144   LEUKOCYTESUR Moderate 04/15/2015 1203    Radiological Exams on Admission: CT ABDOMEN PELVIS W CONTRAST  Result Date: 12/13/2019 CLINICAL DATA:  Right lower quadrant pain. EXAM: CT ABDOMEN AND  PELVIS WITH CONTRAST TECHNIQUE: Multidetector CT imaging of the abdomen and pelvis was performed using the standard protocol following bolus administration of intravenous contrast. CONTRAST:  138mL OMNIPAQUE IOHEXOL 300 MG/ML  SOLN COMPARISON:  July 08, 2019 FINDINGS: Lower chest: No acute abnormality. Hepatobiliary: A very mild amount of branching air density is seen within the anterior aspect of the right lobe of the liver. No gallstones, gallbladder wall thickening, or biliary dilatation. Pancreas: Unremarkable. No pancreatic ductal dilatation or surrounding inflammatory changes. Spleen: Normal in  size without focal abnormality. Adrenals/Urinary Tract: Adrenal glands are unremarkable. Kidneys are normal, without renal calculi, focal lesion, or hydronephrosis. Bladder is unremarkable. Stomach/Bowel: Stomach is within normal limits. Appendix appears normal. There is moderate to marked severity thickening of the cecum and proximal ascending colon. A mild amount of pericolonic inflammatory fat stranding is noted. No evidence of bowel dilatation. Noninflamed diverticula are seen within the sigmoid colon. Vascular/Lymphatic: There is marked severity calcification of the abdominal aorta. No enlarged abdominal or pelvic lymph nodes. Reproductive: Status post hysterectomy. No adnexal masses. Other: No abdominal wall hernia or abnormality. No abdominopelvic ascites. Musculoskeletal: There is approximately 2 mm anterolisthesis of the L4 vertebral body on L5. Mild multilevel degenerative changes are also seen throughout the lumbar spine. IMPRESSION: 1. Moderate to marked severity thickening of the cecum and proximal ascending colon, consistent with infectious or inflammatory colitis. 2. Noninflamed sigmoid diverticulosis. 3. Mild amount of branching air density within the right lobe of the liver. This represents a new finding when compared to the prior study. A mild amount of portal venous air cannot be excluded. Aortic  Atherosclerosis (ICD10-I70.0). Electronically Signed   By: Virgina Norfolk M.D.   On: 12/13/2019 23:00     Assessment/Plan Principal Problem:   Acute abdominal pain CT abdomen/pelvis is positive for colitis OxyContin and proximal ascending colon and also showed mild amount of air density in the right lobe of liver and portal venous system. Patient started on IV ciprofloxacin and IV metronidazole as per GI recommendation. GI will see the patient in the morning. N.p.o. IV morphine 2 mg every 4 hours as needed for moderate to severe pain. Tylenol as needed for fever and mild pain Zofran as needed for nausea and vomiting  Active Problems:   Acute lower UTI Patient is started on ciprofloxacin and metronidazole. Blood and urine cultures ordered  Atrial fibrillation Stable Continue diltiazem and metoprolol Continue home warfarin  Restless leg syndrome Continue home ropinirole  (please populate well all problems here in Problem List. (For example, if patient is on BP meds at home and you resume or decide to hold them, it is a problem that needs to be her. Same for CAD, COPD, HLD and so on)     DVT prophylaxis: Warfarin Code Status: Full code Family Communication: No family member present at the bedside Disposition Plan: Consults called: Gastroenterology Admission status: Observation/telemetry   Edmonia Lynch MD [facility] Pager [phone]   If 7PM-7AM, please contact night-coverage [email or web address] Password   12/14/2019, 6:14 AM

## 2019-12-14 NOTE — Progress Notes (Signed)
ANTICOAGULATION CONSULT NOTE - Initial Consult  Pharmacy Consult for warfarin dosing Indication: atrial fibrillation  Allergies  Allergen Reactions  . Aspirin Nausea And Vomiting  . Ciprofloxacin Diarrhea  . Flagyl [Metronidazole] Diarrhea  . Penicillins Hives and Other (See Comments)         Patient Measurements: Height: 5\' 3"  (160 cm) Weight: 58.3 kg (128 lb 8 oz) IBW/kg (Calculated) : 52.4 Heparin Dosing Weight: HEPARIN DW (KG): 58.3  Vital Signs: Temp: 100.4 F (38 C) (05/24 0827) Temp Source: Oral (05/24 0827) BP: 120/57 (05/24 0827) Pulse Rate: 88 (05/24 0827)  Labs: Recent Labs    12/13/19 2104 12/14/19 0532 12/14/19 1137  HGB 13.2 12.7  --   HCT 41.0 40.2  --   PLT 315 281  --   LABPROT  --   --  23.0*  INR  --   --  2.1*  CREATININE 0.69 0.70  --     Estimated Creatinine Clearance: 49.5 mL/min (by C-G formula based on SCr of 0.7 mg/dL).   Medical History: Past Medical History:  Diagnosis Date  . Arthritis   . Atrial fibrillation ([place])   . Cancer [facility])    BREAST CANCER / CERVICAL CANCER   . Cervical cancer ([place])   . Dysrhythmia   . History of breast cancer 2006   Dr. Sonny Dandy oncologist - left breast  . History of gout   . Hyperlipidemia   . Urinary retention Sept 2016   s/p hysterectomy      Assessment:  Pharmacy consulted to dose warfarin for this   79 yo female with atiral fibrillation on chronic anti-coagulation with warfarin.  Baseline CBC is WNL.   Home dose: warfarin  7.5mg  on Tues and Thurs and 5mg  on all other days Last dose of warfarin: 5mg  on 12-13-19 Drug Interactions: metronidazole Concurrent anti-platelet medications: n/a  Goal of Therapy:  INR 2.0- 3.0  Monitor platelets by anticoagulation protocol: Yes   Plan:  Give warfarin  5mg  po x1 dose tonight for INR 2.1  (home dose) Daily CBC and INR Monitor for s/s of bleeding  Despina Pole 12/14/2019,1:03 PM

## 2019-12-14 NOTE — Progress Notes (Signed)
Per HPI: [name] is a 79 y.o. female with medical history significant of atrial fibrillation on warfarin, restless leg syndrome, prior breast and cervical cancer and urinary retention requiring frequent self-catheterizations presented to ED for evaluation of right lower quadrant abdominal pain.  Patient states that pain started suddenly around 3 PM this evening and continue to worsen.  Pain is crampy and colicky in nature, 8 out of 10 on pain scale, nonradiating, constant and nothing makes it better or worse.  Patient denies fever, chills, nausea, vomiting, constipation, diarrhea and dysuria.  Patient also denies chest pain, shortness of breath and back pain.  Patient reported that she is following up with gynecologic oncology on regular basis but she is not currently on chemotherapy or radiation.  -Patient has been admitted with colitis as well as concerns for UTI and has been started on ciprofloxacin and Flagyl.  Plan to start full liquid diet this morning.  She is noted to have atrial fibrillation with some mild RVR this a.m.  She will continue her home warfarin as well as diltiazem and metoprolol and I will order IV metoprolol as needed for significant heart rate elevations.  GI evaluation pending.  She is noted to have some worsening leukocytosis this a.m. which will need further monitoring.  Anticipate discharge in next 1-2 days pending clinical course.  Total care time: 30 minutes.

## 2019-12-15 DIAGNOSIS — R109 Unspecified abdominal pain: Secondary | ICD-10-CM

## 2019-12-15 LAB — COMPREHENSIVE METABOLIC PANEL
ALT: 11 U/L (ref 0–44)
AST: 11 U/L — ABNORMAL LOW (ref 15–41)
Albumin: 3.1 g/dL — ABNORMAL LOW (ref 3.5–5.0)
Alkaline Phosphatase: 71 U/L (ref 38–126)
Anion gap: 9 (ref 5–15)
BUN: 12 mg/dL (ref 8–23)
CO2: 27 mmol/L (ref 22–32)
Calcium: 8.2 mg/dL — ABNORMAL LOW (ref 8.9–10.3)
Chloride: 98 mmol/L (ref 98–111)
Creatinine, Ser: 0.52 mg/dL (ref 0.44–1.00)
GFR calc Af Amer: >60 mL/min (ref >60)
GFR calc non Af Amer: >60 mL/min (ref >60)
Glucose, Bld: 133 mg/dL — ABNORMAL HIGH (ref 70–99)
Potassium: 3.4 mmol/L — ABNORMAL LOW (ref 3.5–5.1)
Sodium: 134 mmol/L — ABNORMAL LOW (ref 135–145)
Total Bilirubin: 1.1 mg/dL (ref 0.3–1.2)
Total Protein: 5.5 g/dL — ABNORMAL LOW (ref 6.5–8.1)

## 2019-12-15 LAB — CBC
HCT: 36.3 % (ref 36.0–46.0)
Hemoglobin: 11.6 g/dL — ABNORMAL LOW (ref 12.0–15.0)
MCH: 27.5 pg (ref 26.0–34.0)
MCHC: 32 g/dL (ref 30.0–36.0)
MCV: 86 fL (ref 80.0–100.0)
Platelets: 231 10*3/uL (ref 150–400)
RBC: 4.22 MIL/uL (ref 3.87–5.11)
RDW: 15.1 % (ref 11.5–15.5)
WBC: 15.5 10*3/uL — ABNORMAL HIGH (ref 4.0–10.5)
nRBC: 0 % (ref 0.0–0.2)

## 2019-12-15 LAB — C DIFFICILE QUICK SCREEN W PCR REFLEX
C Diff antigen: NEGATIVE
C Diff interpretation: NOT DETECTED
C Diff toxin: NEGATIVE

## 2019-12-15 LAB — PROTIME-INR
INR: 2.2 — ABNORMAL HIGH (ref 0.8–1.2)
Prothrombin Time: 24.1 seconds — ABNORMAL HIGH (ref 11.4–15.2)

## 2019-12-15 MED ORDER — POTASSIUM CHLORIDE CRYS ER 20 MEQ PO TBCR
40.0000 meq | EXTENDED_RELEASE_TABLET | Freq: Once | ORAL | Status: AC
  Administered 2019-12-15: 40 meq via ORAL
  Filled 2019-12-15: qty 2

## 2019-12-15 MED ORDER — WARFARIN SODIUM 7.5 MG PO TABS
7.5000 mg | ORAL_TABLET | Freq: Once | ORAL | Status: AC
  Administered 2019-12-15: 7.5 mg via ORAL
  Filled 2019-12-15: qty 1

## 2019-12-15 NOTE — Progress Notes (Signed)
Patient MEWS score changed because patient was up using the restroom.  Heart rate increased and was changed by CCMD.  Heart rate was not sustained, patient had no symptoms. Once patient back in bed, heart rate decreased and patient is stable.

## 2019-12-15 NOTE — Progress Notes (Addendum)
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 Patient states that pain started suddenly around 3 PM this evening and continue to worsen. Pain is crampy and colicky in nature, 8 out of 10 on pain scale, nonradiating, constant and nothing makes it better or worse. Patient denies fever, chills, nausea, vomiting, constipation, diarrhea and dysuria. Patient also denies chest pain, shortness of breath and back pain. Patient reported that she is following up with gynecologic oncology on regular basis but she is not currently on chemotherapy or radiation.  -Patient has been admitted with colitis as well as concerns for UTI and has been started on ciprofloxacin and Flagyl.  She was noted to have some mild A. fib with RVR on 5/24, but is now rate controlled and remains on warfarin for anticoagulation.  GI plans to check pathogen panel as well as C. difficile with plans to maintain on clear liquid diet and to advance diet later this afternoon.  She continues to have ongoing abdominal pain and tenderness with 2 episodes of loose stools overnight.   Assessment & Plan:   Principal Problem:   Acute abdominal pain Active Problems:   Acute lower UTI   Colitis   Acute colitis -Follow-up CTA in 5/24 ruled out mesenteric ischemia, no complications of bowel perforation or focal abscess -Appreciate GI evaluation -Leukocytosis downtrending with no fevers in the last 24 hours -Continue IV ciprofloxacin and Flagyl through today -Stool C. difficile and pathogen panel per GI -Clear liquid diet and advance to fulls this  afternoon -IV morphine as needed for significant pain  UTI with GNR -Final culture still pending -Continue ciprofloxacin and metronidazole  Mild hypokalemia -Replete orally and reevaluate with labs in a.m.  Paroxysmal atrial fibrillation -Currently stable -Continue home diltiazem and metoprolol with IV metoprolol ordered as needed for significant elevations -Continue warfarin with therapeutic INR noted  Restless leg syndrome -Continue ropinirole and gabapentin  DVT prophylaxis: Currently on warfarin for anticoagulation Code Status: Full code Family Communication: None at bedside Disposition Plan: Continue ongoing IV antibiotics and advance diet as tolerated. Status is: Inpatient  Remains inpatient appropriate because:IV treatments appropriate due to intensity of illness or inability to take PO and Inpatient level of care appropriate due to severity of illness   Dispo: The patient is from: Home              Anticipated d/c is to: Home              Anticipated d/c date is: 1 day              Patient currently is not medically stable to d/c.   Consultants:   GI  Procedures:   None  Antimicrobials:  Anti-infectives (From admission, onward)   Start     Dose/Rate Route Frequency Ordered Stop   12/14/19 1000  ciprofloxacin (CIPRO) IVPB 400 mg    Note to Pharmacy: Pharmacy to adjust the timing for next dose.   400 mg 200 mL/hr over 60 Minutes Intravenous Every 12 hours 12/14/19 0202     12/14/19 0600  metroNIDAZOLE (FLAGYL) IVPB 500 mg    Note to Pharmacy: Pharmacy to adjust timing for next dose.  500 mg 100 mL/hr over 60 Minutes Intravenous Every 8 hours 12/14/19 0202     12/14/19 0000  ciprofloxacin (CIPRO) IVPB 400 mg     400 mg 200 mL/hr over 60 Minutes Intravenous  Once 12/13/19 2358 12/14/19 0121   12/14/19 0000  metroNIDAZOLE (FLAGYL) tablet 500 mg     500 mg Oral  Once 12/13/19 2358 12/14/19 0020     Subjective: Patient seen and evaluated today with some  loose stools noted overnight as well as ongoing abdominal pain and tenderness.  She states that she is improving some today.  Objective: Vitals:   12/14/19 2102 12/15/19 0535 12/15/19 0811 12/15/19 1046  BP: (!) 107/57 99/68  (!) 95/58  Pulse: 100 98  95  Resp: 20 17    Temp: 98.2 F (36.8 C) 98.8 F (37.1 C)    TempSrc: Oral     SpO2: 97% 96% 97%   Weight:      Height:        Intake/Output Summary (Last 24 hours) at 12/15/2019 1051 Last data filed at 12/15/2019 0900 Gross per 24 hour  Intake 563.1 ml  Output --  Net 563.1 ml   Filed Weights   12/13/19 1947 12/14/19 0400  Weight: 58.1 kg 58.3 kg    Examination:  General exam: Appears calm and comfortable  Respiratory system: Clear to auscultation. Respiratory effort normal. Cardiovascular system: S1 & S2 heard, RRR. No JVD, murmurs, rubs, gallops or clicks. No pedal edema. Gastrointestinal system: Abdomen is nondistended, soft and tender to palpation. No organomegaly or masses felt. Normal bowel sounds heard. Central nervous system: Alert and oriented. No focal neurological deficits. Extremities: Symmetric 5 x 5 power. Skin: No rashes, lesions or ulcers Psychiatry: Judgement and insight appear normal. Mood & affect appropriate.     Data Reviewed: I have personally reviewed following labs and imaging studies  CBC: Recent Labs  Lab 12/13/19 2104 12/14/19 0532 12/15/19 0552  WBC 13.4* 19.0* 15.5*  HGB 13.2 12.7 11.6*  HCT 41.0 40.2 36.3  MCV 86.0 86.3 86.0  PLT 315 281 [id number]   Basic Metabolic Panel: Recent Labs  Lab 12/13/19 2104 12/14/19 0532 12/15/19 0552  NA 133* 132* 134*  K 4.3 4.4 3.4*  CL 97* 94* 98  CO2 26 27 27   GLUCOSE 121* 128* 133*  BUN 21 16 12   CREATININE 0.69 0.70 0.52  CALCIUM 9.1 8.8* 8.2*   GFR: Estimated Creatinine Clearance: 49.5 mL/min (by C-G formula based on SCr of 0.52 mg/dL). Liver Function Tests: Recent Labs  Lab 12/13/19 2104 12/14/19 0532 12/15/19 0552  AST 16 18 11*   ALT 15 15 11   ALKPHOS 94 79 71  BILITOT 0.6 0.7 1.1  PROT 6.7 6.4* 5.5*  ALBUMIN 4.2 3.7 3.1*   Recent Labs  Lab 12/13/19 2104  LIPASE 17   No results for input(s): AMMONIA in the last 168 hours. Coagulation Profile: Recent Labs  Lab 12/14/19 1137 12/15/19 0552  INR 2.1* 2.2*   Cardiac Enzymes: No results for input(s): CKTOTAL, CKMB, CKMBINDEX, TROPONINI in the last 168 hours. BNP (last 3 results) No results for input(s): PROBNP in the last 8760 hours. HbA1C: No results for input(s): HGBA1C in the last 72 hours. CBG: No results for input(s): GLUCAP in the last 168 hours. Lipid Profile: No results for input(s): CHOL, HDL, LDLCALC, TRIG, CHOLHDL, LDLDIRECT in the last 72 hours. Thyroid Function Tests: No results for input(s): TSH, T4TOTAL, FREET4, T3FREE, THYROIDAB in the last 72 hours. Anemia Panel: No  results for input(s): VITAMINB12, FOLATE, FERRITIN, TIBC, IRON, RETICCTPCT in the last 72 hours. Sepsis Labs: No results for input(s): PROCALCITON, LATICACIDVEN in the last 168 hours.  Recent Results (from the past 240 hour(s))  Urine culture     Status: Abnormal (Preliminary result)   Collection Time: 12/13/19  9:44 PM   Specimen: Urine, Clean Catch  Result Value Ref Range Status   Specimen Description   Final    URINE, CLEAN CATCH Performed at [facility], [address], [place], [place] [address]    Special Requests   Final    NONE Performed at [facility], [address] [place], [place] [address]    Culture (A)  Final    >=100,000 COLONIES/mL GRAM NEGATIVE RODS SUSCEPTIBILITIES TO FOLLOW Performed at [facility], [place] [address] [place], [place] [address]    Report Status PENDING  Incomplete  Culture, blood (routine x 2)     Status: None (Preliminary result)   Collection Time: 12/14/19 12:25 AM   Specimen: BLOOD RIGHT HAND  Result Value Ref Range Status   Specimen Description BLOOD RIGHT HAND  Final   Special Requests   Final    BOTTLES DRAWN  AEROBIC AND ANAEROBIC Blood Culture adequate volume   Culture   Final    NO GROWTH 1 DAY Performed at [facility], [address] [place], [place] [address]    Report Status PENDING  Incomplete  Culture, blood (routine x 2)     Status: None (Preliminary result)   Collection Time: 12/14/19 12:25 AM   Specimen: BLOOD RIGHT HAND  Result Value Ref Range Status   Specimen Description BLOOD RIGHT HAND  Final   Special Requests   Final    BOTTLES DRAWN AEROBIC AND ANAEROBIC Blood Culture results may not be optimal due to an inadequate volume of blood received in culture bottles   Culture   Final    NO GROWTH 1 DAY Performed at [facility], [address] [place],  [address]    Report Status PENDING  Incomplete  SARS Coronavirus 2 by RT PCR (hospital order, performed in [place] hospital lab) Nasopharyngeal Nasopharyngeal Swab     Status: None   Collection Time: 12/14/19 12:27 AM   Specimen: Nasopharyngeal Swab  Result Value Ref Range Status   SARS Coronavirus 2 NEGATIVE NEGATIVE Final    Comment: (NOTE) SARS-CoV-2 target nucleic acids are NOT DETECTED. The SARS-CoV-2 RNA is generally detectable in upper and lower respiratory specimens during the acute phase of infection. The lowest concentration of SARS-CoV-2 viral copies this assay can detect is 250 copies / mL. A negative result does not preclude SARS-CoV-2 infection and should not be used as the sole basis for treatment or other patient management decisions.  A negative result may occur with improper specimen collection / handling, submission of specimen other than nasopharyngeal swab, presence of viral mutation(s) within the areas targeted by this assay, and inadequate number of viral copies (<250 copies / mL). A negative result must be combined with clinical observations, patient history, and epidemiological information. Fact Sheet for Patients:   StrictlyIdeas.no Fact Sheet for Healthcare  Providers: BankingDealers.co.za This test is not yet approved or cleared  by the [place] FDA and has been authorized for detection and/or diagnosis of SARS-CoV-2 by FDA under an Emergency Use Authorization (EUA).  This EUA will remain in effect (meaning this test can be used) for the duration of the COVID-19 declaration under Section 564(b)(1) of the Act, 21  U.S.C. section 360bbb-3(b)(1), unless the authorization is terminated or revoked sooner. Performed at [facility], [address] [place], [place] [address]   Culture, blood (routine x 2)     Status: None (Preliminary result)   Collection Time: 12/14/19  3:23 PM   Specimen: BLOOD RIGHT HAND  Result Value Ref Range Status   Specimen Description BLOOD RIGHT HAND  Final   Special Requests   Final    BOTTLES DRAWN AEROBIC AND ANAEROBIC Blood Culture adequate volume   Culture   Final    NO GROWTH < 24 HOURS Performed at [facility], [address], [place], [place] [address]    Report Status PENDING  Incomplete  Culture, blood (routine x 2)     Status: None (Preliminary result)   Collection Time: 12/14/19  3:30 PM   Specimen: BLOOD RIGHT HAND  Result Value Ref Range Status   Specimen Description BLOOD RIGHT HAND  Final   Special Requests   Final    BOTTLES DRAWN AEROBIC AND ANAEROBIC Blood Culture adequate volume   Culture   Final    NO GROWTH < 24 HOURS Performed at [facility], [address] [place], [place] [address]    Report Status PENDING  Incomplete         Radiology Studies: CT ABDOMEN PELVIS W CONTRAST  Result Date: 12/13/2019 CLINICAL DATA:  Right lower quadrant pain. EXAM: CT ABDOMEN AND PELVIS WITH CONTRAST TECHNIQUE: Multidetector CT imaging of the abdomen and pelvis was performed using the standard protocol following bolus administration of intravenous contrast. CONTRAST:  193mL OMNIPAQUE IOHEXOL 300 MG/ML  SOLN COMPARISON:  July 08, 2019 FINDINGS: Lower chest: No acute  abnormality. Hepatobiliary: A very mild amount of branching air density is seen within the anterior aspect of the right lobe of the liver. No gallstones, gallbladder wall thickening, or biliary dilatation. Pancreas: Unremarkable. No pancreatic ductal dilatation or surrounding inflammatory changes. Spleen: Normal in size without focal abnormality. Adrenals/Urinary Tract: Adrenal glands are unremarkable. Kidneys are normal, without renal calculi, focal lesion, or hydronephrosis. Bladder is unremarkable. Stomach/Bowel: Stomach is within normal limits. Appendix appears normal. There is moderate to marked severity thickening of the cecum and proximal ascending colon. A mild amount of pericolonic inflammatory fat stranding is noted. No evidence of bowel dilatation. Noninflamed diverticula are seen within the sigmoid colon. Vascular/Lymphatic: There is marked severity calcification of the abdominal aorta. No enlarged abdominal or pelvic lymph nodes. Reproductive: Status post hysterectomy. No adnexal masses. Other: No abdominal wall hernia or abnormality. No abdominopelvic ascites. Musculoskeletal: There is approximately 2 mm anterolisthesis of the L4 vertebral body on L5. Mild multilevel degenerative changes are also seen throughout the lumbar spine. IMPRESSION: 1. Moderate to marked severity thickening of the cecum and proximal ascending colon, consistent with infectious or inflammatory colitis. 2. Noninflamed sigmoid diverticulosis. 3. Mild amount of branching air density within the right lobe of the liver. This represents a new finding when compared to the prior study. A mild amount of portal venous air cannot be excluded. Aortic Atherosclerosis (ICD10-I70.0). Electronically Signed   By: Virgina Norfolk M.D.   On: 12/13/2019 23:00   DG Chest Port 1 View  Result Date: 12/14/2019 CLINICAL DATA:  Fever. EXAM: PORTABLE CHEST 1 VIEW COMPARISON:  Lung bases from CT earlier today. Radiograph 07/02/2019 FINDINGS: Stable  heart size and mediastinal contours. Aortic atherosclerosis. Rounded density at the right hilum is likely vessel on end. No focal airspace disease. No pleural effusion, pulmonary edema, or pneumothorax. Surgical clips in  the left axilla. Remote left rib fractures. Bones are under mineralized. IMPRESSION: No acute abnormality. Aortic Atherosclerosis (ICD10-I70.0). Electronically Signed   By: Keith Rake M.D.   On: 12/14/2019 19:24   CT Angio Abd/Pel w/ and/or w/o  Result Date: 12/14/2019 CLINICAL DATA:  Right lower quadrant pain and nausea. Standard CT of the abdomen and pelvis yesterday demonstrated significant wall thickening and inflammation the cecum and proximal ascending colon. EXAM: CT ANGIOGRAPHY ABDOMEN AND PELVIS WITH CONTRAST TECHNIQUE: Multidetector CT imaging of the abdomen and pelvis was performed using the standard protocol during bolus administration of intravenous contrast. Multiplanar reconstructed images and MIPs were obtained and reviewed to evaluate the vascular anatomy. CONTRAST:  149mL OMNIPAQUE IOHEXOL 350 MG/ML SOLN, 170mL OMNIPAQUE IOHEXOL 300 MG/ML SOLN COMPARISON:  CT of the abdomen and pelvis on 12/13/2019 FINDINGS: VASCULAR Aorta: Scattered calcified plaque without evidence of aortic aneurysm, stenosis or dissection Celiac: The celiac axis demonstrates normal patency. There is normal variant separate origin a right hepatic artery off of the abdominal aorta between the celiac trunk and the superior mesenteric artery. SMA: Normally patent. Renals: Calcified plaque at the origins of bilateral single renal arteries without evidence of significant stenosis. IMA: Normally patent. Inflow: Bilateral iliac arteries demonstrate normal patency without evidence of stenosis or aneurysm. Proximal Outflow: Normally patent bilateral common femoral arteries and femoral bifurcations. Veins: Venous phase imaging was also performed demonstrating normal patency the portal vein, visualized mesenteric  veins, splenic vein, IVC, renal veins, iliac veins and common femoral veins. Review of the MIP images confirms the above findings. NON-VASCULAR Lower chest: No acute abnormality. Hepatobiliary: Stable appearance of the liver. Slightly more distended appearance of the gallbladder without evidence of surrounding inflammation, visible calculi or biliary ductal dilatation. Pancreas: Unremarkable. No pancreatic ductal dilatation or surrounding inflammatory changes. Spleen: Normal in size without focal abnormality. Adrenals/Urinary Tract: Adrenal glands are unremarkable. Kidneys are normal, without renal calculi, focal lesion, or hydronephrosis. Bladder is unremarkable. Stomach/Bowel: Marked edema and inflammation again noted involving the cecum and ascending colon just above the cecum. Inflammation also affects the ileocecal valve without evidence of associated small bowel obstruction. Some of the pericecal inflammatory changes are slightly more pronounced especially posterior to the cecum. There is a slightly increased amount of pericecal fluid and also some increase in dependent free fluid in the pelvis. No evidence of bowel perforation or focal abscess. Findings still point more towards an inflammatory or infectious process involving the colon rather than a neoplastic process. Lymphatic: No enlarged lymph nodes are identified in the abdomen or pelvis. Reproductive: Status post hysterectomy. No adnexal masses. Other: No hernias identified. Musculoskeletal: No acute or significant osseous findings. IMPRESSION: 1. Marked edema and inflammation again noted involving the cecum and ascending colon just above the cecum. Inflammation also affects the ileocecal valve without evidence of associated small bowel obstruction. There is a slightly increased amount of pericecal fluid and also some increase in dependent free fluid in the pelvis. No evidence of bowel perforation or focal abscess. Findings still point more towards an  inflammatory or infectious process involving the colon rather than a neoplastic process. 2. No evidence of mesenteric arterial occlusive disease or venous thrombosis. 3. Normal variant separate origin of a right hepatic artery off of the abdominal aorta between the celiac trunk and the superior mesenteric artery. Electronically Signed   By: Aletta Edouard M.D.   On: 12/14/2019 14:48        Scheduled Meds: . diltiazem  360 mg Oral Daily  .  gabapentin  100 mg Oral QHS  . metoprolol tartrate  25 mg Oral BID  . rOPINIRole  1 mg Oral QHS  . Warfarin - Pharmacist Dosing Inpatient   Does not apply q1600   Continuous Infusions: . sodium chloride 250 mL (12/14/19 1057)  . ciprofloxacin 400 mg (12/15/19 1045)  . metronidazole 500 mg (12/15/19 0634)     LOS: 1 day    Time spent: 35 minutes    Zymiere Trostle D Manuella Ghazi, DO [facility]  If 7PM-7AM, please contact night-coverage [email or web address] 12/15/2019, 10:51 AM

## 2019-12-15 NOTE — Progress Notes (Addendum)
Subjective: Abdominal pain improving. 5/10 in RLQ. Was 7/10 yesterday. No nausea/vomiting. Tolerating clear liquids well. No worsening of pain with eating. Last night 2 loose/watery BMs yellow in color. None in the middle of the night. No brbpr or melena. Had 1 BM this morning. Watery and yellow. No prior diarrhea.   No abx prior to admission, no recent hospitalization, no well water, no sick contacts, no recent travel.   Objective: Vital signs in last 24 hours: Temp:  [98.2 F (36.8 C)-102 F (38.9 C)] 98.8 F (37.1 C) (05/25 0535) Pulse Rate:  [79-100] 98 (05/25 0535) Resp:  [17-20] 17 (05/25 0535) BP: (99-120)/(56-68) 99/68 (05/25 0535) SpO2:  [95 %-98 %] 96 % (05/25 0535) Last BM Date: 12/14/19 General:   Alert and oriented, pleasant Head:  Normocephalic and atraumatic. Eyes:  No icterus, sclera clear. Conjuctiva pink.  Abdomen:  Bowel sounds present, soft, non-distended. Moderate tenderness to palpation in RLQ. No HSM or hernias noted. No rebound or guarding. No masses appreciated  Msk:  Symmetrical without gross deformities. Normal posture. Extremities:  Without edema. Neurologic:  Alert and  oriented x4;  grossly normal neurologically. Psych:  Normal mood and affect.  Intake/Output from previous day: 05/24 0701 - 05/25 0700 In: 323.1 [I.V.:27; IV Piggyback:296.1] Out: -  Intake/Output this shift: No intake/output data recorded.  Lab Results: Recent Labs    12/13/19 2104 12/14/19 0532 12/15/19 0552  WBC 13.4* 19.0* 15.5*  HGB 13.2 12.7 11.6*  HCT 41.0 40.2 36.3  PLT 315 281 231   BMET Recent Labs    12/13/19 2104 12/14/19 0532 12/15/19 0552  NA 133* 132* 134*  K 4.3 4.4 3.4*  CL 97* 94* 98  CO2 26 27 27   GLUCOSE 121* 128* 133*  BUN 21 16 12   CREATININE 0.69 0.70 0.52  CALCIUM 9.1 8.8* 8.2*   LFT Recent Labs    12/13/19 2104 12/14/19 0532 12/15/19 0552  PROT 6.7 6.4* 5.5*  ALBUMIN 4.2 3.7 3.1*  AST 16 18 11*  ALT 15 15 11   ALKPHOS 94 79  71  BILITOT 0.6 0.7 1.1   PT/INR Recent Labs    12/14/19 1137 12/15/19 0552  LABPROT 23.0* 24.1*  INR 2.1* 2.2*    Studies/Results: CT ABDOMEN PELVIS W CONTRAST  Result Date: 12/13/2019 CLINICAL DATA:  Right lower quadrant pain. EXAM: CT ABDOMEN AND PELVIS WITH CONTRAST TECHNIQUE: Multidetector CT imaging of the abdomen and pelvis was performed using the standard protocol following bolus administration of intravenous contrast. CONTRAST:  149mL OMNIPAQUE IOHEXOL 300 MG/ML  SOLN COMPARISON:  July 08, 2019 FINDINGS: Lower chest: No acute abnormality. Hepatobiliary: A very mild amount of branching air density is seen within the anterior aspect of the right lobe of the liver. No gallstones, gallbladder wall thickening, or biliary dilatation. Pancreas: Unremarkable. No pancreatic ductal dilatation or surrounding inflammatory changes. Spleen: Normal in size without focal abnormality. Adrenals/Urinary Tract: Adrenal glands are unremarkable. Kidneys are normal, without renal calculi, focal lesion, or hydronephrosis. Bladder is unremarkable. Stomach/Bowel: Stomach is within normal limits. Appendix appears normal. There is moderate to marked severity thickening of the cecum and proximal ascending colon. A mild amount of pericolonic inflammatory fat stranding is noted. No evidence of bowel dilatation. Noninflamed diverticula are seen within the sigmoid colon. Vascular/Lymphatic: There is marked severity calcification of the abdominal aorta. No enlarged abdominal or pelvic lymph nodes. Reproductive: Status post hysterectomy. No adnexal masses. Other: No abdominal wall hernia or abnormality. No abdominopelvic ascites. Musculoskeletal: There is approximately  2 mm anterolisthesis of the L4 vertebral body on L5. Mild multilevel degenerative changes are also seen throughout the lumbar spine. IMPRESSION: 1. Moderate to marked severity thickening of the cecum and proximal ascending colon, consistent with infectious  or inflammatory colitis. 2. Noninflamed sigmoid diverticulosis. 3. Mild amount of branching air density within the right lobe of the liver. This represents a new finding when compared to the prior study. A mild amount of portal venous air cannot be excluded. Aortic Atherosclerosis (ICD10-I70.0). Electronically Signed   By: Virgina Norfolk M.D.   On: 12/13/2019 23:00   DG Chest Port 1 View  Result Date: 12/14/2019 CLINICAL DATA:  Fever. EXAM: PORTABLE CHEST 1 VIEW COMPARISON:  Lung bases from CT earlier today. Radiograph 07/02/2019 FINDINGS: Stable heart size and mediastinal contours. Aortic atherosclerosis. Rounded density at the right hilum is likely vessel on end. No focal airspace disease. No pleural effusion, pulmonary edema, or pneumothorax. Surgical clips in the left axilla. Remote left rib fractures. Bones are under mineralized. IMPRESSION: No acute abnormality. Aortic Atherosclerosis (ICD10-I70.0). Electronically Signed   By: Keith Rake M.D.   On: 12/14/2019 19:24   CT Angio Abd/Pel w/ and/or w/o  Result Date: 12/14/2019 CLINICAL DATA:  Right lower quadrant pain and nausea. Standard CT of the abdomen and pelvis yesterday demonstrated significant wall thickening and inflammation the cecum and proximal ascending colon. EXAM: CT ANGIOGRAPHY ABDOMEN AND PELVIS WITH CONTRAST TECHNIQUE: Multidetector CT imaging of the abdomen and pelvis was performed using the standard protocol during bolus administration of intravenous contrast. Multiplanar reconstructed images and MIPs were obtained and reviewed to evaluate the vascular anatomy. CONTRAST:  135mL OMNIPAQUE IOHEXOL 350 MG/ML SOLN, 188mL OMNIPAQUE IOHEXOL 300 MG/ML SOLN COMPARISON:  CT of the abdomen and pelvis on 12/13/2019 FINDINGS: VASCULAR Aorta: Scattered calcified plaque without evidence of aortic aneurysm, stenosis or dissection Celiac: The celiac axis demonstrates normal patency. There is normal variant separate origin a right hepatic artery  off of the abdominal aorta between the celiac trunk and the superior mesenteric artery. SMA: Normally patent. Renals: Calcified plaque at the origins of bilateral single renal arteries without evidence of significant stenosis. IMA: Normally patent. Inflow: Bilateral iliac arteries demonstrate normal patency without evidence of stenosis or aneurysm. Proximal Outflow: Normally patent bilateral common femoral arteries and femoral bifurcations. Veins: Venous phase imaging was also performed demonstrating normal patency the portal vein, visualized mesenteric veins, splenic vein, IVC, renal veins, iliac veins and common femoral veins. Review of the MIP images confirms the above findings. NON-VASCULAR Lower chest: No acute abnormality. Hepatobiliary: Stable appearance of the liver. Slightly more distended appearance of the gallbladder without evidence of surrounding inflammation, visible calculi or biliary ductal dilatation. Pancreas: Unremarkable. No pancreatic ductal dilatation or surrounding inflammatory changes. Spleen: Normal in size without focal abnormality. Adrenals/Urinary Tract: Adrenal glands are unremarkable. Kidneys are normal, without renal calculi, focal lesion, or hydronephrosis. Bladder is unremarkable. Stomach/Bowel: Marked edema and inflammation again noted involving the cecum and ascending colon just above the cecum. Inflammation also affects the ileocecal valve without evidence of associated small bowel obstruction. Some of the pericecal inflammatory changes are slightly more pronounced especially posterior to the cecum. There is a slightly increased amount of pericecal fluid and also some increase in dependent free fluid in the pelvis. No evidence of bowel perforation or focal abscess. Findings still point more towards an inflammatory or infectious process involving the colon rather than a neoplastic process. Lymphatic: No enlarged lymph nodes are identified in the abdomen or pelvis. Reproductive:  Status post hysterectomy. No adnexal masses. Other: No hernias identified. Musculoskeletal: No acute or significant osseous findings. IMPRESSION: 1. Marked edema and inflammation again noted involving the cecum and ascending colon just above the cecum. Inflammation also affects the ileocecal valve without evidence of associated small bowel obstruction. There is a slightly increased amount of pericecal fluid and also some increase in dependent free fluid in the pelvis. No evidence of bowel perforation or focal abscess. Findings still point more towards an inflammatory or infectious process involving the colon rather than a neoplastic process. 2. No evidence of mesenteric arterial occlusive disease or venous thrombosis. 3. Normal variant separate origin of a right hepatic artery off of the abdominal aorta between the celiac trunk and the superior mesenteric artery. Electronically Signed   By: Aletta Edouard M.D.   On: 12/14/2019 14:48    Assessment: 79 year old female admitted 5/23 with acute abdominal pain, with CT findings of right-sided colitis. No diarrhea reported. Associated leukocytosis and fever. Follow-up CTA 5/24 to rule out mesenteric ischemia with celiac, SMA, and IMA all patent. Readministration of right sided colitis without bowel perforation or focal abscess. She was started on empiric antibiotics with cipro and flagyl on 5/24.  WBC 15.5 today, down from 19.0 yesterday.  T-max 102F in the last 24 hours, 98.42F this morning. Abdominal pain is improving slowly. Tolerating clear liquids well. She does note new onset of loose/watery diarrhea last night with 2 BMs yesterday afternoon and 1 this morning. Yellow in color.   Suspect we are likely dealing with an infectious colitis.  Plan to continue empiric antibiotics and supportive measures.  Considering new onset of diarrhea, will request nursing staff to collect stool to complete C. difficile and GI pathogen panel that was ordered on 5/23. It is  possible mild diarrhea may be secondary to antibiotics.  Plan: Complete C. difficile and GI pathogen panel Continue empiric antibiotics for now. Continue clear liquid diet for now. Advance later this afternoon or tomorrow.  Continue to monitor.    LOS: 1 day    12/15/2019, 7:49 AM   Aliene Altes, PA-C [facility]

## 2019-12-15 NOTE — Progress Notes (Signed)
ANTICOAGULATION CONSULT NOTE -   Pharmacy Consult for warfarin dosing Indication: atrial fibrillation  Allergies  Allergen Reactions  . Aspirin Nausea And Vomiting  . Ciprofloxacin Diarrhea  . Flagyl [Metronidazole] Diarrhea  . Penicillins Hives and Other (See Comments)         Patient Measurements: Height: 5\' 3"  (160 cm) Weight: 58.3 kg (128 lb 8 oz) IBW/kg (Calculated) : 52.4 Heparin Dosing Weight: HEPARIN DW (KG): 58.3  Vital Signs: Temp: 98.8 F (37.1 C) (05/25 0535) BP: 95/58 (05/25 1046) Pulse Rate: 95 (05/25 1046)  Labs: Recent Labs    12/13/19 2104 12/13/19 2104 12/14/19 0532 12/14/19 1137 12/15/19 0552  HGB 13.2   < > 12.7  --  11.6*  HCT 41.0  --  40.2  --  36.3  PLT 315  --  281  --  231  LABPROT  --   --   --  23.0* 24.1*  INR  --   --   --  2.1* 2.2*  CREATININE 0.69  --  0.70  --  0.52   < > = values in this interval not displayed.    Estimated Creatinine Clearance: 49.5 mL/min (by C-G formula based on SCr of 0.52 mg/dL).   Medical History: Past Medical History:  Diagnosis Date  . Arthritis   . Atrial fibrillation ([place])   . Cancer [facility])    BREAST CANCER / CERVICAL CANCER   . Cervical cancer ([place])   . Dysrhythmia   . History of breast cancer 2006   Dr. Sonny Dandy oncologist - left breast  . History of gout   . Hyperlipidemia   . 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  Baseline CBC is WNL.   Home dose: warfarin  7.5mg  on Tues and Thurs and 5mg  on all other days Drug Interactions: metronidazole Concurrent anti-platelet medications: n/a  INR 2.2  Goal of Therapy:  INR 2.0- 3.0  Monitor platelets by anticoagulation protocol: Yes   Plan:  Give warfarin  7.5mg  po x1 dose  Daily CBC and INR Monitor for s/s of bleeding  Margot Ables, PharmD Clinical Pharmacist 12/15/2019 10:59  AM

## 2019-12-16 DIAGNOSIS — K529 Noninfective gastroenteritis and colitis, unspecified: Secondary | ICD-10-CM

## 2019-12-16 DIAGNOSIS — N390 Urinary tract infection, site not specified: Secondary | ICD-10-CM

## 2019-12-16 LAB — PROTIME-INR
INR: 2.9 — ABNORMAL HIGH (ref 0.8–1.2)
Prothrombin Time: 29.4 seconds — ABNORMAL HIGH (ref 11.4–15.2)

## 2019-12-16 LAB — URINE CULTURE: Culture: >=100000 — AB

## 2019-12-16 LAB — COMPREHENSIVE METABOLIC PANEL
ALT: 12 U/L (ref 0–44)
AST: 9 U/L — ABNORMAL LOW (ref 15–41)
Albumin: 2.9 g/dL — ABNORMAL LOW (ref 3.5–5.0)
Alkaline Phosphatase: 62 U/L (ref 38–126)
Anion gap: 8 (ref 5–15)
BUN: 10 mg/dL (ref 8–23)
CO2: 25 mmol/L (ref 22–32)
Calcium: 8.1 mg/dL — ABNORMAL LOW (ref 8.9–10.3)
Chloride: 101 mmol/L (ref 98–111)
Creatinine, Ser: 0.55 mg/dL (ref 0.44–1.00)
GFR calc Af Amer: >60 mL/min (ref >60)
GFR calc non Af Amer: >60 mL/min (ref >60)
Glucose, Bld: 119 mg/dL — ABNORMAL HIGH (ref 70–99)
Potassium: 3.8 mmol/L (ref 3.5–5.1)
Sodium: 134 mmol/L — ABNORMAL LOW (ref 135–145)
Total Bilirubin: 0.7 mg/dL (ref 0.3–1.2)
Total Protein: 5.4 g/dL — ABNORMAL LOW (ref 6.5–8.1)

## 2019-12-16 LAB — CBC
HCT: 34.4 % — ABNORMAL LOW (ref 36.0–46.0)
Hemoglobin: 11 g/dL — ABNORMAL LOW (ref 12.0–15.0)
MCH: 27.6 pg (ref 26.0–34.0)
MCHC: 32 g/dL (ref 30.0–36.0)
MCV: 86.4 fL (ref 80.0–100.0)
Platelets: 221 10*3/uL (ref 150–400)
RBC: 3.98 MIL/uL (ref 3.87–5.11)
RDW: 14.9 % (ref 11.5–15.5)
WBC: 10.9 10*3/uL — ABNORMAL HIGH (ref 4.0–10.5)
nRBC: 0 % (ref 0.0–0.2)

## 2019-12-16 LAB — MAGNESIUM: Magnesium: 1.9 mg/dL (ref 1.7–2.4)

## 2019-12-16 MED ORDER — METRONIDAZOLE 500 MG PO TABS: 500.0000 mg | ORAL_TABLET | Freq: Three times a day (TID) | ORAL | 0 refills | Status: DC

## 2019-12-16 MED ORDER — CEFDINIR 300 MG PO CAPS: 300.0000 mg | ORAL_CAPSULE | Freq: Two times a day (BID) | ORAL | Status: DC

## 2019-12-16 MED ORDER — METRONIDAZOLE 500 MG PO TABS: 500.0000 mg | ORAL_TABLET | Freq: Three times a day (TID) | ORAL | Status: DC

## 2019-12-16 MED ORDER — CEFDINIR 300 MG PO CAPS: 300.0000 mg | ORAL_CAPSULE | Freq: Two times a day (BID) | ORAL | 0 refills | Status: DC

## 2019-12-16 NOTE — Progress Notes (Addendum)
Subjective: Abdominal pain slowly improving. Continue to be localized to RLQ. About 3-4/10 today. 7 loose/watery stools yesterday. 1 overnight. 1 this morning. Yellow in color. No brbpr or melena. No nausea, vomiting. Tolerating full liquid diet well.   Objective: Vital signs in last 24 hours: Temp:  [98 F (36.7 C)-98.7 F (37.1 C)] 98.3 F (36.8 C) (05/26 0806) Pulse Rate:  [65-101] 65 (05/26 0806) Resp:  [18-20] 18 (05/26 0806) BP: (93-122)/(53-67) 109/58 (05/26 0923) SpO2:  [95 %-98 %] 95 % (05/26 0806) Last BM Date: 12/15/19 General:   Alert and oriented, pleasant Head:  Normocephalic and atraumatic. Abdomen:  Bowel sounds present, soft, non-distended. Moderate TTP in RLQ. No HSM or hernias noted. No rebound or guarding. No masses appreciated  Msk:  Symmetrical without gross deformities.  Extremities:  Without edema. Neurologic:  Alert and  oriented x4;  grossly normal neurologically. Psych:  Normal mood and affect.  Intake/Output from previous day: 05/25 0701 - 05/26 0700 In: 1830 [P.O.:830; IV Piggyback:1000] Out: -  Intake/Output this shift: Total I/O In: 240 [P.O.:240] Out: -   Lab Results: Recent Labs    12/14/19 0532 12/15/19 0552 12/16/19 0624  WBC 19.0* 15.5* 10.9*  HGB 12.7 11.6* 11.0*  HCT 40.2 36.3 34.4*  PLT 281 231 221   BMET Recent Labs    12/14/19 0532 12/15/19 0552 12/16/19 0624  NA 132* 134* 134*  K 4.4 3.4* 3.8  CL 94* 98 101  CO2 27 27 25   GLUCOSE 128* 133* 119*  BUN 16 12 10   CREATININE 0.70 0.52 0.55  CALCIUM 8.8* 8.2* 8.1*   LFT Recent Labs    12/14/19 0532 12/15/19 0552 12/16/19 0624  PROT 6.4* 5.5* 5.4*  ALBUMIN 3.7 3.1* 2.9*  AST 18 11* 9*  ALT 15 11 12   ALKPHOS 79 71 62  BILITOT 0.7 1.1 0.7   PT/INR Recent Labs    12/15/19 0552 12/16/19 0624  LABPROT 24.1* 29.4*  INR 2.2* 2.9*   Studies/Results: DG Chest Port 1 View  Result Date: 12/14/2019 CLINICAL DATA:  Fever. EXAM: PORTABLE CHEST 1 VIEW  COMPARISON:  Lung bases from CT earlier today. Radiograph 07/02/2019 FINDINGS: Stable heart size and mediastinal contours. Aortic atherosclerosis. Rounded density at the right hilum is likely vessel on end. No focal airspace disease. No pleural effusion, pulmonary edema, or pneumothorax. Surgical clips in the left axilla. Remote left rib fractures. Bones are under mineralized. IMPRESSION: No acute abnormality. Aortic Atherosclerosis (ICD10-I70.0). Electronically Signed   By: Pam [name] M.D.   On: 12/14/2019 19:24   CT Angio Abd/Pel w/ and/or w/o  Result Date: 12/14/2019 CLINICAL DATA:  Right lower quadrant pain and nausea. Standard CT of the abdomen and pelvis yesterday demonstrated significant wall thickening and inflammation the cecum and proximal ascending colon. EXAM: CT ANGIOGRAPHY ABDOMEN AND PELVIS WITH CONTRAST TECHNIQUE: Multidetector CT imaging of the abdomen and pelvis was performed using the standard protocol during bolus administration of intravenous contrast. Multiplanar reconstructed images and MIPs were obtained and reviewed to evaluate the vascular anatomy. CONTRAST:  127mL OMNIPAQUE IOHEXOL 350 MG/ML SOLN, 174mL OMNIPAQUE IOHEXOL 300 MG/ML SOLN COMPARISON:  CT of the abdomen and pelvis on 12/13/2019 FINDINGS: VASCULAR Aorta: Scattered calcified plaque without evidence of aortic aneurysm, stenosis or dissection Celiac: The celiac axis demonstrates normal patency. There is normal variant separate origin a right hepatic artery off of the abdominal aorta between the celiac trunk and the superior mesenteric artery. SMA: Normally patent. Renals: Calcified plaque at the origins  of bilateral single renal arteries without evidence of significant stenosis. IMA: Normally patent. Inflow: Bilateral iliac arteries demonstrate normal patency without evidence of stenosis or aneurysm. Proximal Outflow: Normally patent bilateral common femoral arteries and femoral bifurcations. Veins: Venous phase imaging  was also performed demonstrating normal patency the portal vein, visualized mesenteric veins, splenic vein, IVC, renal veins, iliac veins and common femoral veins. Review of the MIP images confirms the above findings. NON-VASCULAR Lower chest: No acute abnormality. Hepatobiliary: Stable appearance of the liver. Slightly more distended appearance of the gallbladder without evidence of surrounding inflammation, visible calculi or biliary ductal dilatation. Pancreas: Unremarkable. No pancreatic ductal dilatation or surrounding inflammatory changes. Spleen: Normal in size without focal abnormality. Adrenals/Urinary Tract: Adrenal glands are unremarkable. Kidneys are normal, without renal calculi, focal lesion, or hydronephrosis. Bladder is unremarkable. Stomach/Bowel: Marked edema and inflammation again noted involving the cecum and ascending colon just above the cecum. Inflammation also affects the ileocecal valve without evidence of associated small bowel obstruction. Some of the pericecal inflammatory changes are slightly more pronounced especially posterior to the cecum. There is a slightly increased amount of pericecal fluid and also some increase in dependent free fluid in the pelvis. No evidence of bowel perforation or focal abscess. Findings still point more towards an inflammatory or infectious process involving the colon rather than a neoplastic process. Lymphatic: No enlarged lymph nodes are identified in the abdomen or pelvis. Reproductive: Status post hysterectomy. No adnexal masses. Other: No hernias identified. Musculoskeletal: No acute or significant osseous findings. IMPRESSION: 1. Marked edema and inflammation again noted involving the cecum and ascending colon just above the cecum. Inflammation also affects the ileocecal valve without evidence of associated small bowel obstruction. There is a slightly increased amount of pericecal fluid and also some increase in dependent free fluid in the pelvis. No  evidence of bowel perforation or focal abscess. Findings still point more towards an inflammatory or infectious process involving the colon rather than a neoplastic process. 2. No evidence of mesenteric arterial occlusive disease or venous thrombosis. 3. Normal variant separate origin of a right hepatic artery off of the abdominal aorta between the celiac trunk and the superior mesenteric artery. Electronically Signed   By: Aletta Edouard M.D.   On: 12/14/2019 14:48    Assessment: 79 year old female admitted 5/23 with acute abdominal pain, with CT findings of right-sided colitis. Associated leukocytosis and fever. Follow-up CTA 5/24 to rule out mesenteric ischemia with celiac, SMA, and IMA all patent. Readministration of right sided colitis without bowel perforation or focal abscess. She denied diarrhea initially and was started on empiric antibiotics with cipro and flagyl on 5/24. Ultimately developed diarrhea the evening of 5/24 and stool studies have been ordered. C. Diff negative and GI pathogen panel pending. Had 7 loose/watery BMs yesterday. 1 this morning. Abdominal pain is improving slowly. WBC count down to 10.9 today. Tolerating full liquid diet well.   Suspect infectious colitis. Diarrhea may also be influenced by antibiotics. Interestingly patient also has a UTI with culture positive for escherichia coli and resistant to ciprofloxacin. Pharmacy has stopped her empiric antibiotics. Last dose of cipro and flagyl this morning.    Called to speak with pharmacy. Spoke with Pam [name] as Pam [name] is in progression. States Pam [name] had plans to discuss antibiotics with Pam [name]. Pam [name] suggested starting omnicef and continuing flagyl to cover UTI and colitis. Plan to discuss further with Pam [name].   Plan: Discuss antibiotics with Pam [name]. She will need to have a  7 day course to cover the colitis. Further recommendations to follow.  Advance to soft diet.  Follow-up on GI pathogen  panel  Addendum: Spoke with Pam [name]. Pam [name], [facility] also recommended omnicef and flagyl. Will plan for omnicef 300 mg BID and flagyl 500 mg TID.    LOS: 2 days    12/16/2019, 9:38 AM   Aliene Altes, PA-C [facility]

## 2019-12-16 NOTE — Progress Notes (Signed)
   12/16/19 0806  Assess: MEWS Score  Temp 98.3 F (36.8 C)  BP 122/61  Pulse Rate 65  Resp 18  SpO2 95 %  O2 Device Room Air  Assess: MEWS Score  MEWS Temp 0  MEWS Systolic 0  MEWS Pulse 0  MEWS RR 0  MEWS LOC 0  MEWS Score 0  MEWS Score Color Green  Assess: if the MEWS score is Yellow or Red  Were vital signs taken at a resting state? Yes  Focused Assessment Documented focused assessment  Early Detection of Sepsis Score *See Row Information* Low  MEWS guidelines implemented *See Row Information* No, vital signs rechecked  Treat  MEWS Interventions Other (Comment) (assessment)  Notify: Charge Nurse/RN  Name of Charge Nurse/RN Notified D. Hassell Done RN  Date Charge Nurse/RN Notified 12/16/19  Time Charge Nurse/RN Notified 0800  Notify: Philmore Lepore  Joylynn Defrancesco Name/Title Memon MD  Date Azul Coffie Notified 12/16/19  Time Freddie Nghiem Notified [phone]  Notification Type Page  Notification Reason Other (Comment) (HR increase)  Response No new orders  Date of Jarissa Sheriff Response 12/16/19  Time of Ellamay Fors Response [phone]  Document  Patient Outcome  (assessment complted )  Progress note created (see row info) Yes

## 2019-12-16 NOTE — Plan of Care (Signed)
  Problem: Education: Goal: Knowledge of General Education information will improve Description: Including pain rating scale, medication(s)/side effects and non-pharmacologic comfort measures Outcome: Progressing   Problem: Clinical Measurements: Goal: Will remain free from infection Outcome: Progressing   Problem: Clinical Measurements: Goal: Respiratory complications will improve Outcome: Progressing   Problem: Clinical Measurements: Goal: Cardiovascular complication will be avoided Outcome: Progressing   Problem: Elimination: Goal: Will not experience complications related to bowel motility Outcome: Progressing   Problem: Pain Managment: Goal: General experience of comfort will improve Outcome: Progressing   Problem: Skin Integrity: Goal: Risk for impaired skin integrity will decrease Outcome: Progressing   Problem: Safety: Goal: Ability to remain free from injury will improve Outcome: Progressing

## 2019-12-16 NOTE — Progress Notes (Signed)
Discharge paperwork reviewed with patient. Patient denies any questions or concerns at this time.

## 2019-12-16 NOTE — Discharge Summary (Signed)
Physician Discharge Summary  Pam [name] [id number] DOB: 10-14-1940 DOA: 12/13/2019  PCP: Pam Burly, MD  Admit date: 12/13/2019 Discharge date: 12/16/2019  Admitted From: Home Disposition: Home  Recommendations for Outpatient Follow-up:  1. Follow up with PCP in 1-2 weeks 2. Please obtain BMP/CBC in one week 3. 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 Patient states that pain started suddenly around 3 PM this evening and continue to worsen. Pain is crampy and colicky in nature, 8 out of 10 on pain scale, nonradiating, constant and nothing makes it better or worse. Patient denies fever, chills, nausea, vomiting, constipation, diarrhea and dysuria. Patient also denies chest pain, shortness of breath and back pain. Patient reported that she is following up with gynecologic oncology on regular basis but she is not currently on chemotherapy or radiation.  Discharge Diagnoses:  Principal Problem:   Acute abdominal pain Active Problems:   Acute lower UTI   Colitis  Acute colitis -Follow-up CTA in 5/24 ruled out mesenteric ischemia, no complications of bowel perforation or focal abscess -Appreciate GI evaluation -Leukocytosis downtrending with no fevers in the last 24 hours -Continue IV ciprofloxacin and Flagyl through today -Stool for C. difficile found to be negative -Diet was advanced and she is now tolerating solid diet -She has not required any pain medications in the last 24 hours -Case reviewed with Dr. Gala [name] and it was noted that on initial CT abdomen there was  mild air noted in right dome of liver.  Since patient is clinically improving with improving abdominal pain, would not recommend any further intervention at this time.  This will be followed up as an outpatient. -She has been transitioned to oral antibiotics.  UTI with E. coli -Based on cultures, she has been started on Omnicef  Mild hypokalemia -Replaced.  Magnesium 1.9.  Paroxysmal atrial fibrillation -Heart rate stable on metoprolol and diltiazem -Continue warfarin with therapeutic INR noted  Restless leg syndrome -Continue ropinirole and gabapentin  Discharge Instructions  Discharge Instructions    Diet - low sodium heart healthy   Complete by: As directed    Increase activity slowly   Complete by: As directed      Allergies as of 12/16/2019      Reactions   Aspirin Nausea And Vomiting   Ciprofloxacin Diarrhea   Flagyl [metronidazole] Diarrhea   Penicillins Hives, Other (See Comments)         Medication List    TAKE these medications   bisacodyl 5 MG EC tablet Commonly known as: DULCOLAX Take 5 mg by mouth daily.   cefdinir 300 MG capsule Commonly known as: OMNICEF Take 1 capsule (300 mg total) by mouth every 12 (twelve) hours.   diltiazem 360 MG 24 hr capsule Commonly known as: CARDIZEM CD Take 1 capsule (360 mg total) by mouth daily.   fexofenadine 180 MG tablet Commonly known as: ALLEGRA Take 180 mg by mouth daily.   gabapentin 100 MG capsule Commonly known as: NEURONTIN Take 100 mg by mouth at bedtime.   metoprolol tartrate 25 MG tablet Commonly known as: LOPRESSOR Take 1 tablet (25 mg total) by mouth 2 (two) times daily.   metroNIDAZOLE 500  MG tablet Commonly known as: FLAGYL Take 1 tablet (500 mg total) by mouth every 8 (eight) hours.   rOPINIRole 1 MG tablet Commonly known as: REQUIP Take 1 mg by mouth at bedtime.   traMADol 50 MG tablet Commonly known as: ULTRAM Take 50 mg by mouth 2 (two) times daily as needed for moderate pain.    warfarin 5 MG tablet Commonly known as: COUMADIN Take 5-7.5 mg by mouth See admin instructions. Take 5mg  daily in the morning. Take 7.5mg  on Tuesdays and Thursdays      Follow-up Information    Rourk, Cristopher Estimable, MD. Schedule an appointment as soon as possible for a visit in 2 week(s).   Specialty: Gastroenterology Contact information: [place] [address] [phone]          Allergies  Allergen Reactions  . Aspirin Nausea And Vomiting  . Ciprofloxacin Diarrhea  . Flagyl [Metronidazole] Diarrhea  . Penicillins Hives and Other (See Comments)         Consultations:  Gastroenterology   Procedures/Studies: CT ABDOMEN PELVIS W CONTRAST  Result Date: 12/13/2019 CLINICAL DATA:  Right lower quadrant pain. EXAM: CT ABDOMEN AND PELVIS WITH CONTRAST TECHNIQUE: Multidetector CT imaging of the abdomen and pelvis was performed using the standard protocol following bolus administration of intravenous contrast. CONTRAST:  171mL OMNIPAQUE IOHEXOL 300 MG/ML  SOLN COMPARISON:  July 08, 2019 FINDINGS: Lower chest: No acute abnormality. Hepatobiliary: A very mild amount of branching air density is seen within the anterior aspect of the right lobe of the liver. No gallstones, gallbladder wall thickening, or biliary dilatation. Pancreas: Unremarkable. No pancreatic ductal dilatation or surrounding inflammatory changes. Spleen: Normal in size without focal abnormality. Adrenals/Urinary Tract: Adrenal glands are unremarkable. Kidneys are normal, without renal calculi, focal lesion, or hydronephrosis. Bladder is unremarkable. Stomach/Bowel: Stomach is within normal limits. Appendix appears normal. There is moderate to marked severity thickening of the cecum and proximal ascending colon. A mild amount of pericolonic inflammatory fat stranding is noted. No evidence of bowel dilatation. Noninflamed diverticula are seen within the sigmoid colon. Vascular/Lymphatic: There is marked  severity calcification of the abdominal aorta. No enlarged abdominal or pelvic lymph nodes. Reproductive: Status post hysterectomy. No adnexal masses. Other: No abdominal wall hernia or abnormality. No abdominopelvic ascites. Musculoskeletal: There is approximately 2 mm anterolisthesis of the L4 vertebral body on L5. Mild multilevel degenerative changes are also seen throughout the lumbar spine. IMPRESSION: 1. Moderate to marked severity thickening of the cecum and proximal ascending colon, consistent with infectious or inflammatory colitis. 2. Noninflamed sigmoid diverticulosis. 3. Mild amount of branching air density within the right lobe of the liver. This represents a new finding when compared to the prior study. A mild amount of portal venous air cannot be excluded. Aortic Atherosclerosis (ICD10-I70.0). Electronically Signed   By: Virgina Norfolk M.D.   On: 12/13/2019 23:00   DG Chest Port 1 View  Result Date: 12/14/2019 CLINICAL DATA:  Fever. EXAM: PORTABLE CHEST 1 VIEW COMPARISON:  Lung bases from CT earlier today. Radiograph 07/02/2019 FINDINGS: Stable heart size and mediastinal contours. Aortic atherosclerosis. Rounded density at the right hilum is likely vessel on end. No focal airspace disease. No pleural effusion, pulmonary edema, or pneumothorax. Surgical clips in the left axilla. Remote left rib fractures. Bones are under mineralized. IMPRESSION: No acute abnormality. Aortic Atherosclerosis (ICD10-I70.0). Electronically Signed   By: Keith Rake M.D.   On: 12/14/2019 19:24   CT Angio Abd/Pel w/ and/or w/o  Result Date: 12/14/2019  CLINICAL DATA:  Right lower quadrant pain and nausea. Standard CT of the abdomen and pelvis yesterday demonstrated significant wall thickening and inflammation the cecum and proximal ascending colon. EXAM: CT ANGIOGRAPHY ABDOMEN AND PELVIS WITH CONTRAST TECHNIQUE: Multidetector CT imaging of the abdomen and pelvis was performed using the standard protocol during  bolus administration of intravenous contrast. Multiplanar reconstructed images and MIPs were obtained and reviewed to evaluate the vascular anatomy. CONTRAST:  149mL OMNIPAQUE IOHEXOL 350 MG/ML SOLN, 124mL OMNIPAQUE IOHEXOL 300 MG/ML SOLN COMPARISON:  CT of the abdomen and pelvis on 12/13/2019 FINDINGS: VASCULAR Aorta: Scattered calcified plaque without evidence of aortic aneurysm, stenosis or dissection Celiac: The celiac axis demonstrates normal patency. There is normal variant separate origin a right hepatic artery off of the abdominal aorta between the celiac trunk and the superior mesenteric artery. SMA: Normally patent. Renals: Calcified plaque at the origins of bilateral single renal arteries without evidence of significant stenosis. IMA: Normally patent. Inflow: Bilateral iliac arteries demonstrate normal patency without evidence of stenosis or aneurysm. Proximal Outflow: Normally patent bilateral common femoral arteries and femoral bifurcations. Veins: Venous phase imaging was also performed demonstrating normal patency the portal vein, visualized mesenteric veins, splenic vein, IVC, renal veins, iliac veins and common femoral veins. Review of the MIP images confirms the above findings. NON-VASCULAR Lower chest: No acute abnormality. Hepatobiliary: Stable appearance of the liver. Slightly more distended appearance of the gallbladder without evidence of surrounding inflammation, visible calculi or biliary ductal dilatation. Pancreas: Unremarkable. No pancreatic ductal dilatation or surrounding inflammatory changes. Spleen: Normal in size without focal abnormality. Adrenals/Urinary Tract: Adrenal glands are unremarkable. Kidneys are normal, without renal calculi, focal lesion, or hydronephrosis. Bladder is unremarkable. Stomach/Bowel: Marked edema and inflammation again noted involving the cecum and ascending colon just above the cecum. Inflammation also affects the ileocecal valve without evidence of  associated small bowel obstruction. Some of the pericecal inflammatory changes are slightly more pronounced especially posterior to the cecum. There is a slightly increased amount of pericecal fluid and also some increase in dependent free fluid in the pelvis. No evidence of bowel perforation or focal abscess. Findings still point more towards an inflammatory or infectious process involving the colon rather than a neoplastic process. Lymphatic: No enlarged lymph nodes are identified in the abdomen or pelvis. Reproductive: Status post hysterectomy. No adnexal masses. Other: No hernias identified. Musculoskeletal: No acute or significant osseous findings. IMPRESSION: 1. Marked edema and inflammation again noted involving the cecum and ascending colon just above the cecum. Inflammation also affects the ileocecal valve without evidence of associated small bowel obstruction. There is a slightly increased amount of pericecal fluid and also some increase in dependent free fluid in the pelvis. No evidence of bowel perforation or focal abscess. Findings still point more towards an inflammatory or infectious process involving the colon rather than a neoplastic process. 2. No evidence of mesenteric arterial occlusive disease or venous thrombosis. 3. Normal variant separate origin of a right hepatic artery off of the abdominal aorta between the celiac trunk and the superior mesenteric artery. Electronically Signed   By: Aletta Edouard M.D.   On: 12/14/2019 14:48       Subjective: Feeling better.  Abdominal pain improving.  Tolerating solid diet.  Discharge Exam: Vitals:   12/15/19 2232 12/16/19 0443 12/16/19 0806 12/16/19 0923  BP: (!) 110/53 113/67 122/61 (!) 109/58  Pulse: 94 (!) 101 65   Resp: 20  18   Temp: 98.7 F (37.1 C) 98.1 F (36.7 C)  98.3 F (36.8 C)   TempSrc: Oral Oral Oral   SpO2: 97% 96% 95%   Weight:      Height:        General: Pt is alert, awake, not in acute distress Cardiovascular:  RRR, S1/S2 +, no rubs, no gallops Respiratory: CTA bilaterally, no wheezing, no rhonchi Abdominal: Soft, NT, ND, bowel sounds + Extremities: no edema, no cyanosis    The results of significant diagnostics from this hospitalization (including imaging, microbiology, ancillary and laboratory) are listed below for reference.     Microbiology: Recent Results (from the past 240 hour(s))  Urine culture     Status: Abnormal   Collection Time: 12/13/19  9:44 PM   Specimen: Urine, Clean Catch  Result Value Ref Range Status   Specimen Description   Final    URINE, CLEAN CATCH Performed at [facility], [address], [place], [place] [address]    Special Requests   Final    NONE Performed at [facility], [address], [place], [place] [address]    Culture >=100,000 COLONIES/mL ESCHERICHIA COLI (A)  Final   Report Status 12/16/2019 FINAL  Final   Organism ID, Bacteria ESCHERICHIA COLI (A)  Final      Susceptibility   Escherichia coli - MIC*    AMPICILLIN <=2 SENSITIVE Sensitive     CEFAZOLIN <=4 SENSITIVE Sensitive     CEFTRIAXONE <=1 SENSITIVE Sensitive     CIPROFLOXACIN >=4 RESISTANT Resistant     GENTAMICIN <=1 SENSITIVE Sensitive     IMIPENEM <=0.25 SENSITIVE Sensitive     NITROFURANTOIN <=16 SENSITIVE Sensitive     TRIMETH/SULFA >=320 RESISTANT Resistant     AMPICILLIN/SULBACTAM <=2 SENSITIVE Sensitive     PIP/TAZO <=4 SENSITIVE Sensitive     * >=100,000 COLONIES/mL ESCHERICHIA COLI  Culture, blood (routine x 2)     Status: None (Preliminary result)   Collection Time: 12/14/19 12:25 AM   Specimen: BLOOD RIGHT HAND  Result Value Ref Range Status   Specimen Description BLOOD RIGHT HAND  Final   Special Requests   Final    BOTTLES DRAWN AEROBIC AND ANAEROBIC Blood Culture adequate volume   Culture   Final    NO GROWTH 2 DAYS Performed at [facility], [address], [place], [place] [address]    Report Status PENDING  Incomplete  Culture, blood (routine x 2)     Status:  None (Preliminary result)   Collection Time: 12/14/19 12:25 AM   Specimen: BLOOD RIGHT HAND  Result Value Ref Range Status   Specimen Description BLOOD RIGHT HAND  Final   Special Requests   Final    BOTTLES DRAWN AEROBIC AND ANAEROBIC Blood Culture results may not be optimal due to an inadequate volume of blood received in culture bottles   Culture   Final    NO GROWTH 2 DAYS Performed at [facility], [address] [place], [place] [address]    Report Status PENDING  Incomplete  SARS Coronavirus 2 by RT PCR (hospital order, performed in [place] hospital lab) Nasopharyngeal Nasopharyngeal Swab     Status: None   Collection Time: 12/14/19 12:27 AM   Specimen: Nasopharyngeal Swab  Result Value Ref Range Status   SARS Coronavirus 2 NEGATIVE NEGATIVE Final    Comment: (NOTE) SARS-CoV-2 target nucleic acids are NOT DETECTED. The SARS-CoV-2 RNA is generally detectable in upper and lower respiratory specimens during the acute phase of infection. The lowest concentration of SARS-CoV-2 viral copies this assay can detect  is 250 copies / mL. A negative result does not preclude SARS-CoV-2 infection and should not be used as the sole basis for treatment or other patient management decisions.  A negative result may occur with improper specimen collection / handling, submission of specimen other than nasopharyngeal swab, presence of viral mutation(s) within the areas targeted by this assay, and inadequate number of viral copies (<250 copies / mL). A negative result must be combined with clinical observations, patient history, and epidemiological information. Fact Sheet for Patients:   StrictlyIdeas.no Fact Sheet for Healthcare Providers: BankingDealers.co.za This test is not yet approved or cleared  by the [place] FDA and has been authorized for detection and/or diagnosis of SARS-CoV-2 by FDA under an Emergency Use Authorization (EUA).   This EUA will remain in effect (meaning this test can be used) for the duration of the COVID-19 declaration under Section 564(b)(1) of the Act, 21 U.S.C. section 360bbb-3(b)(1), unless the authorization is terminated or revoked sooner. Performed at [facility], [address], [place], [place] [address]   Culture, blood (routine x 2)     Status: None (Preliminary result)   Collection Time: 12/14/19  3:23 PM   Specimen: BLOOD RIGHT HAND  Result Value Ref Range Status   Specimen Description BLOOD RIGHT HAND  Final   Special Requests   Final    BOTTLES DRAWN AEROBIC AND ANAEROBIC Blood Culture adequate volume   Culture   Final    NO GROWTH 2 DAYS Performed at [facility], [address], [place], [place] [address]    Report Status PENDING  Incomplete  Culture, blood (routine x 2)     Status: None (Preliminary result)   Collection Time: 12/14/19  3:30 PM   Specimen: BLOOD RIGHT HAND  Result Value Ref Range Status   Specimen Description BLOOD RIGHT HAND  Final   Special Requests   Final    BOTTLES DRAWN AEROBIC AND ANAEROBIC Blood Culture adequate volume   Culture   Final    NO GROWTH 2 DAYS Performed at [facility], [address] [place], [place] [address]    Report Status PENDING  Incomplete  C Difficile Quick Screen w PCR reflex     Status: None   Collection Time: 12/15/19  5:31 PM   Specimen: STOOL  Result Value Ref Range Status   C Diff antigen NEGATIVE NEGATIVE Final   C Diff toxin NEGATIVE NEGATIVE Final   C Diff interpretation No C. difficile detected.  Final    Comment: Performed at [facility], [address], [place], [place] [address]     Labs: BNP (last 3 results) No results for input(s): BNP in the last 8760 hours. Basic Metabolic Panel: Recent Labs  Lab 12/13/19 2104 12/14/19 0532 12/15/19 0552 12/16/19 0624  NA 133* 132* 134* 134*  K 4.3 4.4 3.4* 3.8  CL 97* 94* 98 101  CO2 26 27 27 25   GLUCOSE 121* 128* 133* 119*  BUN 21 16 12 10   CREATININE 0.69  0.70 0.52 0.55  CALCIUM 9.1 8.8* 8.2* 8.1*  MG  --   --   --  1.9   Liver Function Tests: Recent Labs  Lab 12/13/19 2104 12/14/19 0532 12/15/19 0552 12/16/19 0624  AST 16 18 11* 9*  ALT 15 15 11 12   ALKPHOS 94 79 71 62  BILITOT 0.6 0.7 1.1 0.7  PROT 6.7 6.4* 5.5* 5.4*  ALBUMIN 4.2 3.7 3.1* 2.9*   Recent Labs  Lab 12/13/19 2104  LIPASE 17  No results for input(s): AMMONIA in the last 168 hours. CBC: Recent Labs  Lab 12/13/19 2104 12/14/19 0532 12/15/19 0552 12/16/19 0624  WBC 13.4* 19.0* 15.5* 10.9*  HGB 13.2 12.7 11.6* 11.0*  HCT 41.0 40.2 36.3 34.4*  MCV 86.0 86.3 86.0 86.4  PLT 315 281 231 221   Cardiac Enzymes: No results for input(s): CKTOTAL, CKMB, CKMBINDEX, TROPONINI in the last 168 hours. BNP: Invalid input(s): POCBNP CBG: No results for input(s): GLUCAP in the last 168 hours. D-Dimer No results for input(s): DDIMER in the last 72 hours. Hgb A1c No results for input(s): HGBA1C in the last 72 hours. Lipid Profile No results for input(s): CHOL, HDL, LDLCALC, TRIG, CHOLHDL, LDLDIRECT in the last 72 hours. Thyroid function studies No results for input(s): TSH, T4TOTAL, T3FREE, THYROIDAB in the last 72 hours.  Invalid input(s): FREET3 Anemia work up No results for input(s): VITAMINB12, FOLATE, FERRITIN, TIBC, IRON, RETICCTPCT in the last 72 hours. Urinalysis    Component Value Date/Time   COLORURINE YELLOW 12/13/2019 2144   APPEARANCEUR HAZY (A) 12/13/2019 2144   LABSPEC 1.016 12/13/2019 2144   LABSPEC 1.005 04/15/2015 1203   PHURINE 5.0 12/13/2019 2144   GLUCOSEU NEGATIVE 12/13/2019 2144   GLUCOSEU Negative 04/15/2015 1203   HGBUR SMALL (A) 12/13/2019 2144   BILIRUBINUR NEGATIVE 12/13/2019 2144   BILIRUBINUR Negative 04/15/2015 [place] 12/13/2019 2144   PROTEINUR NEGATIVE 12/13/2019 2144   UROBILINOGEN 0.2 04/15/2015 1203   NITRITE POSITIVE (A) 12/13/2019 2144   LEUKOCYTESUR MODERATE (A) 12/13/2019 2144   LEUKOCYTESUR  Moderate 04/15/2015 1203   Sepsis Labs Invalid input(s): PROCALCITONIN,  WBC,  LACTICIDVEN Microbiology Recent Results (from the past 240 hour(s))  Urine culture     Status: Abnormal   Collection Time: 12/13/19  9:44 PM   Specimen: Urine, Clean Catch  Result Value Ref Range Status   Specimen Description   Final    URINE, CLEAN CATCH Performed at [facility], [address] [place], [place] [address]    Special Requests   Final    NONE Performed at [facility], [address] [place], [place] [address]    Culture >=100,000 COLONIES/mL ESCHERICHIA COLI (A)  Final   Report Status 12/16/2019 FINAL  Final   Organism ID, Bacteria ESCHERICHIA COLI (A)  Final      Susceptibility   Escherichia coli - MIC*    AMPICILLIN <=2 SENSITIVE Sensitive     CEFAZOLIN <=4 SENSITIVE Sensitive     CEFTRIAXONE <=1 SENSITIVE Sensitive     CIPROFLOXACIN >=4 RESISTANT Resistant     GENTAMICIN <=1 SENSITIVE Sensitive     IMIPENEM <=0.25 SENSITIVE Sensitive     NITROFURANTOIN <=16 SENSITIVE Sensitive     TRIMETH/SULFA >=320 RESISTANT Resistant     AMPICILLIN/SULBACTAM <=2 SENSITIVE Sensitive     PIP/TAZO <=4 SENSITIVE Sensitive     * >=100,000 COLONIES/mL ESCHERICHIA COLI  Culture, blood (routine x 2)     Status: None (Preliminary result)   Collection Time: 12/14/19 12:25 AM   Specimen: BLOOD RIGHT HAND  Result Value Ref Range Status   Specimen Description BLOOD RIGHT HAND  Final   Special Requests   Final    BOTTLES DRAWN AEROBIC AND ANAEROBIC Blood Culture adequate volume   Culture   Final    NO GROWTH 2 DAYS Performed at [facility], [address] [place],  [address]    Report Status PENDING  Incomplete  Culture, blood (routine x 2)     Status: None (  Preliminary result)   Collection Time: 12/14/19 12:25 AM   Specimen: BLOOD RIGHT HAND  Result Value Ref Range Status   Specimen Description BLOOD RIGHT HAND  Final   Special Requests   Final    BOTTLES DRAWN AEROBIC AND ANAEROBIC  Blood Culture results may not be optimal due to an inadequate volume of blood received in culture bottles   Culture   Final    NO GROWTH 2 DAYS Performed at [facility], [address] [place], [place] [address]    Report Status PENDING  Incomplete  SARS Coronavirus 2 by RT PCR (hospital order, performed in [place] hospital lab) Nasopharyngeal Nasopharyngeal Swab     Status: None   Collection Time: 12/14/19 12:27 AM   Specimen: Nasopharyngeal Swab  Result Value Ref Range Status   SARS Coronavirus 2 NEGATIVE NEGATIVE Final    Comment: (NOTE) SARS-CoV-2 target nucleic acids are NOT DETECTED. The SARS-CoV-2 RNA is generally detectable in upper and lower respiratory specimens during the acute phase of infection. The lowest concentration of SARS-CoV-2 viral copies this assay can detect is 250 copies / mL. A negative result does not preclude SARS-CoV-2 infection and should not be used as the sole basis for treatment or other patient management decisions.  A negative result may occur with improper specimen collection / handling, submission of specimen other than nasopharyngeal swab, presence of viral mutation(s) within the areas targeted by this assay, and inadequate number of viral copies (<250 copies / mL). A negative result must be combined with clinical observations, patient history, and epidemiological information. Fact Sheet for Patients:   StrictlyIdeas.no Fact Sheet for Healthcare Providers: BankingDealers.co.za This test is not yet approved or cleared  by the [place] FDA and has been authorized for detection and/or diagnosis of SARS-CoV-2 by FDA under an Emergency Use Authorization (EUA).  This EUA will remain in effect (meaning this test can be used) for the duration of the COVID-19 declaration under Section 564(b)(1) of the Act, 21 U.S.C. section 360bbb-3(b)(1), unless the authorization is terminated or revoked  sooner. Performed at [facility], [address] [place], [place] [address]   Culture, blood (routine x 2)     Status: None (Preliminary result)   Collection Time: 12/14/19  3:23 PM   Specimen: BLOOD RIGHT HAND  Result Value Ref Range Status   Specimen Description BLOOD RIGHT HAND  Final   Special Requests   Final    BOTTLES DRAWN AEROBIC AND ANAEROBIC Blood Culture adequate volume   Culture   Final    NO GROWTH 2 DAYS Performed at [facility], [address] [place], [place] [address]    Report Status PENDING  Incomplete  Culture, blood (routine x 2)     Status: None (Preliminary result)   Collection Time: 12/14/19  3:30 PM   Specimen: BLOOD RIGHT HAND  Result Value Ref Range Status   Specimen Description BLOOD RIGHT HAND  Final   Special Requests   Final    BOTTLES DRAWN AEROBIC AND ANAEROBIC Blood Culture adequate volume   Culture   Final    NO GROWTH 2 DAYS Performed at [facility], [address] [place], [place] [address]    Report Status PENDING  Incomplete  C Difficile Quick Screen w PCR reflex     Status: None   Collection Time: 12/15/19  5:31 PM   Specimen: STOOL  Result Value Ref Range Status   C Diff antigen NEGATIVE NEGATIVE Final   C Diff toxin NEGATIVE  NEGATIVE Final   C Diff interpretation No C. difficile detected.  Final    Comment: Performed at [facility], [address], [place], [place] [address]     Time coordinating discharge: 30mins  SIGNED:   Kathie Dike, MD  [facility] 12/16/2019, 9:41 PM   If 7PM-7AM, please contact night-coverage [email or web address]

## 2019-12-16 NOTE — Progress Notes (Signed)
ANTICOAGULATION CONSULT NOTE -   Pharmacy Consult for warfarin dosing Indication: atrial fibrillation  Allergies  Allergen Reactions  . Aspirin Nausea And Vomiting  . Ciprofloxacin Diarrhea  . Flagyl [Metronidazole] Diarrhea  . Penicillins Hives and Other (See Comments)         Patient Measurements: Height: 5\' 3"  (160 cm) Weight: 58.3 kg (128 lb 8 oz) IBW/kg (Calculated) : 52.4 Heparin Dosing Weight: HEPARIN DW (KG): 58.3  Vital Signs: Temp: 98.3 F (36.8 C) (05/26 0806) Temp Source: Oral (05/26 0806) BP: 109/58 (05/26 0923) Pulse Rate: 65 (05/26 0806)  Labs: Recent Labs    12/14/19 0532 12/14/19 0532 12/14/19 1137 12/15/19 0552 12/16/19 0624  HGB 12.7   < >  --  11.6* 11.0*  HCT 40.2  --   --  36.3 34.4*  PLT 281  --   --  231 221  LABPROT  --   --  23.0* 24.1* 29.4*  INR  --   --  2.1* 2.2* 2.9*  CREATININE 0.70  --   --  0.52 0.55   < > = values in this interval not displayed.    Estimated Creatinine Clearance: 49.5 mL/min (by C-G formula based on SCr of 0.55 mg/dL).   Medical History: Past Medical History:  Diagnosis Date  . Arthritis   . Atrial fibrillation ([place])   . Cancer [facility])    BREAST CANCER / CERVICAL CANCER   . Cervical cancer ([place])   . Dysrhythmia   . History of breast cancer 2006   Dr. Sonny Dandy oncologist - left breast  . History of gout   . Hyperlipidemia   . Urinary retention Sept 2016   s/p hysterectomy      Assessment:  Pharmacy consulted to dose warfarin for this   79 yo female with atiral fibrillation on chronic anti-coagulation with warfarin.  Baseline CBC is WNL.   Home dose: warfarin  7.5mg  on Tues and Thurs and 5mg  on all other days Drug Interactions: metronidazole Concurrent anti-platelet medications: n/a  INR 2.2>>2.9  Goal of Therapy:  INR 2.0- 3.0  Monitor platelets by anticoagulation protocol: Yes   Plan:  Hold warfarin x 1 dose due to large increase in INR Daily CBC and INR Monitor for s/s of  bleeding  Margot Ables, PharmD Clinical Pharmacist 12/16/2019 11:02 AM

## 2019-12-16 NOTE — Progress Notes (Signed)
MEWS score increase d/t increased HR. MD notified. No new orders.  Note: Expected increase in HR with exertion.. No need to notify or follow MEWS change if caused by this per MD  Charge nurse notified.  Elodia Florence RN

## 2019-12-17 ENCOUNTER — Telehealth: Payer: Self-pay | Admitting: Orthopaedic Surgery

## 2019-12-17 NOTE — Telephone Encounter (Signed)
Spoke with Museum/gallery conservator at Dr. Joselyn Arrow office regarding patient's perioperative instructions for blood thinner.  Amber stated patient is taking Coumadin. Clearance was obtained from Dr [name] for the upcoming right total knee surgery. Note of surgery sheet completed by Dr. [name] reads "on [place]. Will need restart Eliquis 1 week before surgery (by Dr. [name])"

## 2019-12-18 DIAGNOSIS — I480 Paroxysmal atrial fibrillation: Secondary | ICD-10-CM | POA: Diagnosis not present

## 2019-12-18 LAB — GASTROINTESTINAL PANEL BY PCR, STOOL (REPLACES STOOL CULTURE)

## 2019-12-19 LAB — CULTURE, BLOOD (ROUTINE X 2)
Culture: NO GROWTH
Culture: NO GROWTH
Culture: NO GROWTH
Culture: NO GROWTH
Special Requests: ADEQUATE
Special Requests: ADEQUATE
Special Requests: ADEQUATE

## 2019-12-30 ENCOUNTER — Other Ambulatory Visit: Payer: Self-pay

## 2019-12-30 ENCOUNTER — Ambulatory Visit (INDEPENDENT_AMBULATORY_CARE_PROVIDER_SITE_OTHER): Payer: Medicare Other | Admitting: Nurse Practitioner

## 2019-12-30 ENCOUNTER — Encounter: Payer: Self-pay | Admitting: Nurse Practitioner

## 2019-12-30 VITALS — BP 103/68 | HR 78 | Temp 97.1°F | Ht 62.0 in | Wt 129.4 lb

## 2019-12-30 DIAGNOSIS — R109 Unspecified abdominal pain: Secondary | ICD-10-CM

## 2019-12-30 DIAGNOSIS — R197 Diarrhea, unspecified: Secondary | ICD-10-CM | POA: Diagnosis not present

## 2019-12-30 DIAGNOSIS — K529 Noninfective gastroenteritis and colitis, unspecified: Secondary | ICD-10-CM

## 2019-12-30 NOTE — Assessment & Plan Note (Addendum)
Patient was recently admitted for was found to be likely infectious colitis.  CT imaging with thickened bowel wall.  CTA unremarkable for any vascular compromise.  She completed her antibiotics and has since improved globally in her symptoms.  Her last colonoscopy was 10 years ago and we will likely need to repeat this but we will plan for 3 months of "cool down" before attempting a colonoscopy.  Her last colonoscopy was 10 years ago we will request her report from [facility].  Return for follow-up in 4 months.  Proceed with TCS on propofol/MAC with Dr. Gala Romney in near future: the risks, benefits, and alternatives have been discussed with the patient in detail. The patient states understanding and desires to proceed.  The patient is currently on Neurontin, Ultram, Coumadin. The patient is not on any other anticoagulants, anxiolytics, chronic pain medications, antidepressants, antidiabetics, or iron supplements.  We will discuss holding Coumadin for 3 days prior to her procedure with her primary prescriber.  We will also ask for any need for Lovenox bridge.  We will plan for the procedure on propofol/MAC to promote adequate sedation.  ASA II (AFib s/p cardioversion 05/2019; recent echo normal EF)

## 2019-12-30 NOTE — Patient Instructions (Addendum)
Your health issues we discussed today were:   Colitis (irritation of colon lining due to likely infection) with abdominal pain and diarrhea: 1. But her symptoms have improved 2. We will request her previous colonoscopy report from [facility] 3. Start taking a probiotic, available over-the-counter, for the next 1 to 2 months.  Some brands we have had good luck with include Jiles Crocker colon health, Restora 4. Let us know if you have any worsening or recurrent symptoms 5. We will schedule a colonoscopy for you, likely sometime in September or October 6. We will call the provider who prescribes her Coumadin to ask if he can hold this for 3 days prior to your procedure.  Further recommendations will follow  Overall I recommend:  1. Continue your other current medications 2. Return for follow-up in 6 months 3. Call us for any questions or concerns   At [facility] we value your feedback. You may receive a survey about your visit today. Please share your experience as we strive to create trusting relationships with our patients to provide genuine, compassionate, quality care.  We appreciate your understanding and patience as we review any laboratory studies, imaging, and other diagnostic tests that are ordered as we care for you. Our office policy is 5 business days for review of these results, and any emergent or urgent results are addressed in a timely manner for your best interest. If you do not hear from our office in 1 week, please contact us.   We also encourage the use of MyChart, which contains your medical information for your review as well. If you are not enrolled in this feature, an access code is on this after visit summary for your convenience. Thank you for allowing [place] to be involved in your care.  It was great to see you today!  I hope you have a great Summer!!

## 2019-12-30 NOTE — Progress Notes (Signed)
Referring Provider: Neale Burly, MD Primary Care Physician:  Neale Burly, MD Primary GI:  Dr. Gala Romney  Chief Complaint  Patient presents with  . HFU    sometimes after eating will have diarrhea but doing well so far    HPI:   Pam [name] is a 79 y.o. female who presents for hospital follow-up.  The patient was admitted at the hospital from 12/13/2019 through 12/16/2019.  She presented to the emergency department with complaints of right lower quadrant abdominal pain that was progressively worse, crampy and colicky and pretty significant/severe.  No fever, chills, diarrhea, constipation.  Follows up with gynecologic oncology on a regular basis but not currently on chemotherapy or radiation.  Noted CTA 5/20 for the rule out mesenteric ischemia.  CT findings included right-sided colitis associated with leukocytosis and fever in May.  Readmission with right-sided colitis with bowel perforation or focal abscess.  Started on empiric antibiotics with Cipro and Flagyl and eventually developed diarrhea.  C. difficile negative and GI path panel was pending.  She did have a total of 7 loose/watery bowel movements on 525 during hospitalization.  Abdominal pain slowly improving, white blood cell count improving.  Suspected infectious colitis likely diarrhea influenced by antibiotics.  Also noted UTI with culture positive for E. coli and resistant to Cipro.  Recommended change of antibiotics to Omnicef and Flagyl.  GI path panel resulted 12/15/2019 and was negative.  Today she states she is doing okay overall.  Sometimes having diarrhea after eating but doing well/improved overall since hospital discharge. She denies abdominal pain, N/V, fever, chills, unintentional weight loss. Diarrhea doing well, has about 1 stools a day, consistent with [place] 5-6. Is not on anything for loose/soft stools. Has been having more frequent belching. Has some food regurgitation with belching at times; denies dysphagia.  Denies overt GERD symptoms. Denies hematochezia, melena. She completed all her antibiotics. Denies URI or flu-like symptoms. Denies loss of sense of taste or smell. The patient has not received COVID-19 vaccination(s). They decline information on scheduling a COVID-19 vaccine. Denies chest pain, dyspnea, dizziness, lightheadedness, syncope, near syncope. Denies any other upper or lower GI symptoms.  Had a colonoscopy about 10 years ago at [facility]now [facility]  Past Medical History:  Diagnosis Date  . Arthritis   . Atrial fibrillation ([place])   . Cancer [facility])    BREAST CANCER / CERVICAL CANCER   . Cervical cancer ([place])   . Dysrhythmia   . History of breast cancer 2006   Dr. Sonny Dandy oncologist - left breast  . History of gout   . Hyperlipidemia   . Urinary retention Sept 2016   s/p hysterectomy    Past Surgical History:  Procedure Laterality Date  . BREAST SURGERY    . CARDIOVERSION N/A 06/03/2019   Procedure: CARDIOVERSION;  Surgeon: Jerline Pain, MD;  Location: [facility] ENDOSCOPY;  Service: Cardiovascular;  Laterality: N/A;  . CATARACT EXTRACTION W/ INTRAOCULAR LENS  IMPLANT, BILATERAL    . EYE SURGERY    . INGUINAL HERNIA REPAIR Right 12/14/2015   Procedure: LAPAROSCOPIC AND OPEN RIGHT  INGUINAL HERNIA;  Surgeon: Johnathan Hausen, MD;  Location: [facility] ORS;  Service: General;  Laterality: Right;  . KNEE ARTHROSCOPY Left   . MASTECTOMY Left 2006  . MASTECTOMY WITH AXILLARY LYMPH NODE DISSECTION Left   . ROBOTIC ASSISTED TOTAL HYSTERECTOMY WITH BILATERAL SALPINGO OOPHERECTOMY Bilateral 04/07/2015   Procedure: ROBOTIC ASSISTED RADICAL HYSTERECTOMY BILATERAL SALPINGO OOPHORECTOMY BILATERAL SENTINEL LYMPHADENETOMY;  Surgeon:  Everitt Amber, MD;  Location: [facility] ORS;  Service: Gynecology;  Laterality: Bilateral;  . TOTAL KNEE ARTHROPLASTY Left 07/13/2019   Procedure: LEFT TOTAL KNEE ARTHROPLASTY-CEMENTED;  Surgeon: Marybelle Killings, MD;  Location: [place];  Service: Orthopedics;  Laterality: Left;  .  TUBAL LIGATION      Current Outpatient Medications  Medication Sig Dispense Refill  . bisacodyl (DULCOLAX) 5 MG EC tablet Take 5 mg by mouth daily.    Marland Kitchen diltiazem (CARDIZEM CD) 360 MG 24 hr capsule Take 1 capsule (360 mg total) by mouth daily. 90 capsule 3  . fexofenadine (ALLEGRA) 180 MG tablet Take 180 mg by mouth daily.    Marland Kitchen gabapentin (NEURONTIN) 100 MG capsule Take 100 mg by mouth at bedtime.  1  . metoprolol tartrate (LOPRESSOR) 25 MG tablet Take 1 tablet (25 mg total) by mouth 2 (two) times daily. 180 tablet 3  . rOPINIRole (REQUIP) 1 MG tablet Take 1 mg by mouth at bedtime.  0  . traMADol (ULTRAM) 50 MG tablet Take 50 mg by mouth 2 (two) times daily as needed for moderate pain.     Marland Kitchen warfarin (COUMADIN) 5 MG tablet Take 5-7.5 mg by mouth See admin instructions. Take 5mg  daily in the morning. Take 7.5mg  on Tuesdays and Thursdays    . cefdinir (OMNICEF) 300 MG capsule Take 1 capsule (300 mg total) by mouth every 12 (twelve) hours. (Patient not taking: Reported on 12/30/2019) 14 capsule 0  . metroNIDAZOLE (FLAGYL) 500 MG tablet Take 1 tablet (500 mg total) by mouth every 8 (eight) hours. (Patient not taking: Reported on 12/30/2019) 21 tablet 0   No current facility-administered medications for this visit.    Allergies as of 12/30/2019 - Review Complete 12/30/2019  Allergen Reaction Noted  . Aspirin Nausea And Vomiting 03/23/2015  . Ciprofloxacin Diarrhea 04/03/2014  . Flagyl [metronidazole] Diarrhea 04/03/2014  . Penicillins Hives and Other (See Comments) 04/03/2014    Family History  Problem Relation Age of Onset  . Prostate cancer Father   . Heart failure Father        40s  . Heart failure Mother        43  . Breast cancer Sister   . Colon polyps Sister        early 68s   . Colon cancer Neg Hx     Social History   Socioeconomic History  . Marital status: Married    Spouse name: Not on file  . Number of children: 1  . Years of education: Not on file  . Highest  education level: Not on file  Occupational History  . Not on file  Tobacco Use  . Smoking status: Former Smoker    Packs/day: 1.00    Years: 30.00    Pack years: 30.00    Types: Cigarettes    Quit date: 07/23/1994    Years since quitting: 25.4  . Smokeless tobacco: Never Used  Substance and Sexual Activity  . Alcohol use: Never    Alcohol/week: 0.0 standard drinks  . Drug use: Never  . Sexual activity: Not Currently  Other Topics Concern  . Not on file  Social History Narrative   One daughter.  Lives with husband.     Social Determinants of Health   Financial Resource Strain:   . Difficulty of Paying Living Expenses:   Food Insecurity:   . Worried About Charity fundraiser in the Last Year:   . [place] in the Last Year:  Transportation Needs:   . Film/video editor (Medical):   Marland Kitchen Lack of Transportation (Non-Medical):   Physical Activity:   . Days of Exercise per Week:   . Minutes of Exercise per Session:   Stress:   . Feeling of Stress :   Social Connections:   . Frequency of Communication with Friends and Family:   . Frequency of Social Gatherings with Friends and Family:   . Attends Religious Services:   . Active Member of Clubs or Organizations:   . Attends Archivist Meetings:   Marland Kitchen Marital Status:     Subjective: Review of Systems  Constitutional: Negative for chills, fever, malaise/fatigue and weight loss.  HENT: Negative for congestion and sore throat.   Respiratory: Negative for cough and shortness of breath.   Cardiovascular: Negative for chest pain and palpitations.  Gastrointestinal: Negative for abdominal pain, blood in stool, diarrhea, melena, nausea and vomiting.  Musculoskeletal: Negative for joint pain and myalgias.  Skin: Negative for rash.  Neurological: Negative for dizziness and weakness.  Endo/Heme/Allergies: Does not bruise/bleed easily.  Psychiatric/Behavioral: Negative for depression. The patient is not  nervous/anxious.   All other systems reviewed and are negative.    Objective: BP 103/68   Pulse 78   Temp (!) 97.1 F (36.2 C) (Oral)   Ht 5\' 2"  (1.575 m)   Wt 129 lb 6.4 oz (58.7 kg)   BMI 23.67 kg/m  Physical Exam Vitals and nursing note reviewed.  Constitutional:      General: She is not in acute distress.    Appearance: Normal appearance. She is well-developed. She is not ill-appearing, toxic-appearing or diaphoretic.  HENT:     Head: Normocephalic and atraumatic.     Nose: No congestion or rhinorrhea.  Eyes:     General: No scleral icterus. Cardiovascular:     Rate and Rhythm: Normal rate and regular rhythm.     Heart sounds: Normal heart sounds.  Pulmonary:     Effort: Pulmonary effort is normal. No respiratory distress.     Breath sounds: Normal breath sounds.  Abdominal:     General: Bowel sounds are normal.     Palpations: Abdomen is soft. There is no hepatomegaly, splenomegaly or mass.     Tenderness: There is no abdominal tenderness. There is no guarding or rebound.     Hernia: No hernia is present.  Skin:    General: Skin is warm and dry.     Coloration: Skin is not jaundiced.     Findings: No rash.  Neurological:     General: No focal deficit present.     Mental Status: She is alert and oriented to person, place, and time.  Psychiatric:        Attention and Perception: Attention normal.        Mood and Affect: Mood normal.        Speech: Speech normal.        Behavior: Behavior normal.        Thought Content: Thought content normal.        Cognition and Memory: Cognition and memory normal.       12/30/2019 11:25 AM   Disclaimer: This note was dictated with voice recognition software. Similar sounding words can inadvertently be transcribed and may not be corrected upon review.

## 2019-12-30 NOTE — Assessment & Plan Note (Signed)
The patient was admitted with right lower quadrant abdominal pain that was quite severe.  This is found to be likely infectious colitis.  She was started on antibiotics which were changed halfway through her course due to UTI commitment.  Her abdominal pain continue to improve, diet was advanced and she was discharged in satisfactory condition.  Today she presents and has had essentially resolution of all of her symptoms.  She does have a bowel movement once a day which is consistent with [place] 5-6.  She has not tried any medications to help solidify her stools further.  Continue to monitor for worsening or recurrent symptoms.  Follow-up in 4 months.

## 2019-12-30 NOTE — Assessment & Plan Note (Signed)
Frequent diarrhea during hospitalization in the setting of likely infectious colitis.  Her diarrhea has improved significantly.  She is currently having a bowel movement a day anywhere from [place] 5-6.  I recommended she try an over-the-counter probiotic to see if this can help solidify her stools further.  Further recommendations to follow.  Follow-up in 4 months.

## 2020-01-01 ENCOUNTER — Other Ambulatory Visit: Payer: Self-pay

## 2020-01-01 ENCOUNTER — Inpatient Hospital Stay: Payer: Medicare Other | Attending: Gynecologic Oncology | Admitting: Gynecologic Oncology

## 2020-01-01 ENCOUNTER — Encounter: Payer: Self-pay | Admitting: Gynecologic Oncology

## 2020-01-01 VITALS — BP 115/57 | HR 60 | Temp 97.8°F | Resp 18 | Ht 62.0 in | Wt 130.0 lb

## 2020-01-01 DIAGNOSIS — I890 Lymphedema, not elsewhere classified: Secondary | ICD-10-CM | POA: Diagnosis not present

## 2020-01-01 DIAGNOSIS — E785 Hyperlipidemia, unspecified: Secondary | ICD-10-CM | POA: Insufficient documentation

## 2020-01-01 DIAGNOSIS — R32 Unspecified urinary incontinence: Secondary | ICD-10-CM | POA: Insufficient documentation

## 2020-01-01 DIAGNOSIS — I4891 Unspecified atrial fibrillation: Secondary | ICD-10-CM | POA: Diagnosis not present

## 2020-01-01 DIAGNOSIS — M1990 Unspecified osteoarthritis, unspecified site: Secondary | ICD-10-CM | POA: Diagnosis not present

## 2020-01-01 DIAGNOSIS — Z08 Encounter for follow-up examination after completed treatment for malignant neoplasm: Secondary | ICD-10-CM | POA: Diagnosis present

## 2020-01-01 DIAGNOSIS — Z90722 Acquired absence of ovaries, bilateral: Secondary | ICD-10-CM | POA: Insufficient documentation

## 2020-01-01 DIAGNOSIS — Z87891 Personal history of nicotine dependence: Secondary | ICD-10-CM | POA: Insufficient documentation

## 2020-01-01 DIAGNOSIS — Z79899 Other long term (current) drug therapy: Secondary | ICD-10-CM | POA: Insufficient documentation

## 2020-01-01 DIAGNOSIS — Z90710 Acquired absence of both cervix and uterus: Secondary | ICD-10-CM | POA: Diagnosis not present

## 2020-01-01 DIAGNOSIS — R2231 Localized swelling, mass and lump, right upper limb: Secondary | ICD-10-CM | POA: Diagnosis not present

## 2020-01-01 DIAGNOSIS — Z8541 Personal history of malignant neoplasm of cervix uteri: Secondary | ICD-10-CM | POA: Insufficient documentation

## 2020-01-01 DIAGNOSIS — Z7901 Long term (current) use of anticoagulants: Secondary | ICD-10-CM | POA: Diagnosis not present

## 2020-01-01 DIAGNOSIS — M7989 Other specified soft tissue disorders: Secondary | ICD-10-CM

## 2020-01-01 DIAGNOSIS — Z853 Personal history of malignant neoplasm of breast: Secondary | ICD-10-CM | POA: Diagnosis not present

## 2020-01-01 DIAGNOSIS — C539 Malignant neoplasm of cervix uteri, unspecified: Secondary | ICD-10-CM

## 2020-01-01 NOTE — Progress Notes (Signed)
CERVICAL CANCER FOLLOWUP Assessment:    79 y.o. 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   S/p robotic radical hysterectomy, BSO and bilateral SLN biopsy on 04/07/15. No evidence of recurrence.  Urinary incontinence (overflow) and bladder dysfunction (lack of sensation of voiding)  Plan: 1) cervical cancer: continue 6 monthly followup  (will suspend this after December, 2021 after which she will be 5 years out).   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 Continue care with Dr Matilde Sprang appreciate his assistance.  3) Surveillance: Return to clinic 6  months to evaluate for surveillance of cancer. Repeat pap in December 2021.  4)left lower extremity lymphedema  5) right shoulder mass - will order ultrasound, suspect lymph node or lipoma.   HPI:  Pam [name] is a 79 y.o. year old initially seen in consultation on 04/25/15 for cervical cancer, referred by Dr Benjie Karvonen.  She then underwent a robotic radical hysterectomy, BSO, bilateral pelvic sentinel lymph node biopsy on 5/40/08 without complications. Preoperative PET scan on 03/30/15 was negative for metastatic disease Her postoperative course was complicated by the development of urinary retention and overflow incontinence. She required prolonged catheterization and then intermittent self catheterization teaching.  Her final pathology revealed a 4.5cm moderately differentiated SCC of the cervix which had no LVSI, but had deep cervical stromal invasion (11 of 49mm). She had negative margins, negative nodes and negative parametrium. Her closest margin was 19mm.  She was recommended adjuvant pelvic RT to decrease risk of recurrence, but declined this after counseling with Dr Sondra Come due to patient concern regarding toxicity.   She developed postoperative  urinary retention likely secondary to the radical parametrial dissection and interruption of hypogastric nerves from her radical procedure. She was encouraged to self cath every 2hours to keep herself dry (she would have incontinence if she left it longer).  The patient was evaluated by Dr Matilde Sprang and has been continuing to self cath every 2 - 3 hours. She spontaneously voids only a little (50cc). She has some acceptance with this (less frustration).  In May, 2017 Dr Hassell Done from [place] repaired her right indirect hernia. This was an uncomplicated surgery.   She reported symptoms of bladder prolapse.  Pap was normal September, 2017 and October, 2018. Pap showed ASCUS in October, 2019 but was negative for high risk HPV.   CT scan performed in December 2020 to evaluate hematochezia and left lower extremity edema showed no evidence of recurrence.  She did have a bout of diverticulitis and was treated with antibiotics for this.  Interval Hx. .  In the spring 2021 she began experiencing a sensation of a small mobile right mass in the anterior shoulder not continuous with the breast tissue or axillary nodes.  It was nontender.  Current Outpatient Medications on File Prior to Visit  Medication Sig Dispense Refill  . bisacodyl (DULCOLAX) 5 MG EC tablet Take 5 mg by mouth daily.    . fexofenadine (ALLEGRA) 180 MG tablet Take 180 mg by mouth daily.    Marland Kitchen gabapentin (NEURONTIN) 100 MG capsule Take 100 mg by mouth at bedtime.  1  . metoprolol tartrate (LOPRESSOR) 25 MG tablet Take 1 tablet (25 mg total) by mouth 2 (two) times daily. 180 tablet 3  . rOPINIRole (REQUIP) 1 MG tablet Take 1 mg by mouth at bedtime.  0  . traMADol (  ULTRAM) 50 MG tablet Take 50 mg by mouth 2 (two) times daily as needed for moderate pain.     Marland Kitchen warfarin (COUMADIN) 5 MG tablet Take 5-7.5 mg by mouth See admin instructions. Take 5mg  daily in the morning. Take 7.5mg  on Tuesdays and Thursdays    . diltiazem (CARDIZEM CD) 360 MG 24 hr  capsule Take 1 capsule (360 mg total) by mouth daily. 90 capsule 3   No current facility-administered medications on file prior to visit.   Allergies  Allergen Reactions  . Aspirin Nausea And Vomiting  . Ciprofloxacin Diarrhea  . Flagyl [Metronidazole] Diarrhea  . Penicillins Hives and Other (See Comments)        Past Medical History:  Diagnosis Date  . Arthritis   . Atrial fibrillation ([place])   . Cancer [facility])    BREAST CANCER / CERVICAL CANCER   . Cervical cancer ([place])   . Dysrhythmia   . History of breast cancer 2006   Dr. Sonny Dandy oncologist - left breast  . History of gout   . Hyperlipidemia   . Urinary retention Sept 2016   s/p hysterectomy   Past Surgical History:  Procedure Laterality Date  . BREAST SURGERY    . CARDIOVERSION N/A 06/03/2019   Procedure: CARDIOVERSION;  Surgeon: Jerline Pain, MD;  Location: [facility] ENDOSCOPY;  Service: Cardiovascular;  Laterality: N/A;  . CATARACT EXTRACTION W/ INTRAOCULAR LENS  IMPLANT, BILATERAL    . EYE SURGERY    . INGUINAL HERNIA REPAIR Right 12/14/2015   Procedure: LAPAROSCOPIC AND OPEN RIGHT  INGUINAL HERNIA;  Surgeon: Johnathan Hausen, MD;  Location: [facility] ORS;  Service: General;  Laterality: Right;  . KNEE ARTHROSCOPY Left   . MASTECTOMY Left 2006  . MASTECTOMY WITH AXILLARY LYMPH NODE DISSECTION Left   . ROBOTIC ASSISTED TOTAL HYSTERECTOMY WITH BILATERAL SALPINGO OOPHERECTOMY Bilateral 04/07/2015   Procedure: ROBOTIC ASSISTED RADICAL HYSTERECTOMY BILATERAL SALPINGO OOPHORECTOMY BILATERAL SENTINEL LYMPHADENETOMY;  Surgeon: Everitt Amber, MD;  Location: [facility] ORS;  Service: Gynecology;  Laterality: Bilateral;  . TOTAL KNEE ARTHROPLASTY Left 07/13/2019   Procedure: LEFT TOTAL KNEE ARTHROPLASTY-CEMENTED;  Surgeon: Marybelle Killings, MD;  Location: [facility];  Service: Orthopedics;  Laterality: Left;  . TUBAL LIGATION     Family History  Problem Relation Age of Onset  . Prostate cancer Father   . Heart failure Father        16s  . Heart failure  Mother        27  . Breast cancer Sister   . Colon polyps Sister        early 59s   . Colon cancer Neg Hx    Social History   Socioeconomic History  . Marital status: Married    Spouse name: Not on file  . Number of children: 1  . Years of education: Not on file  . Highest education level: Not on file  Occupational History  . Not on file  Tobacco Use  . Smoking status: Former Smoker    Packs/day: 1.00    Years: 30.00    Pack years: 30.00    Types: Cigarettes    Quit date: 07/23/1994    Years since quitting: 25.4  . Smokeless tobacco: Never Used  Vaping Use  . Vaping Use: Never used  Substance and Sexual Activity  . Alcohol use: Never    Alcohol/week: 0.0 standard drinks  . Drug use: Never  . Sexual activity: Not Currently  Other Topics Concern  . Not on file  Social  History Narrative   One daughter.  Lives with husband.     Social Determinants of Health   Financial Resource Strain:   . Difficulty of Paying Living Expenses:   Food Insecurity:   . Worried About Charity fundraiser in the Last Year:   . Arboriculturist in the Last Year:   Transportation Needs:   . Film/video editor (Medical):   Marland Kitchen Lack of Transportation (Non-Medical):   Physical Activity:   . Days of Exercise per Week:   . Minutes of Exercise per Session:   Stress:   . Feeling of Stress :   Social Connections:   . Frequency of Communication with Friends and Family:   . Frequency of Social Gatherings with Friends and Family:   . Attends Religious Services:   . Active Member of Clubs or Organizations:   . Attends Archivist Meetings:   Marland Kitchen Marital Status:   Intimate Partner Violence:   . Fear of Current or Ex-Partner:   . Emotionally Abused:   Marland Kitchen Physically Abused:   . Sexually Abused:      Review of systems: Constitutional:  She has no weight gain or weight loss. She has no fever or chills. Eyes: No blurred vision Ears, Nose, Mouth, Throat: No dizziness, headaches or changes  in hearing. No mouth sores. Cardiovascular: No chest pain, palpitations or edema. Respiratory:  No shortness of breath, wheezing or cough Gastrointestinal: She has normal bowel movements without diarrhea or constipation. She denies any nausea or vomiting. She denies blood in her stool or heart burn. Genitourinary:  She denies pelvic pain, pelvic pressure. She has no hematuria, dysuria, or incontinence. + urinary retention + bladder prolapse symptoms Musculoskeletal: Denies muscle weakness or joint pains + right shoulder mass Skin:  She has no skin changes, rashes or itching Neurological:  Denies dizziness or headaches. No neuropathy, no numbness or tingling. Psychiatric:  She denies depression or anxiety. Hematologic/Lymphatic:   No easy bruising or bleeding   Physical Exam: Blood pressure (!) 115/57, pulse 60, temperature 97.8 F (36.6 C), temperature source Oral, resp. rate 18, height 5\' 2"  (1.575 m), weight 130 lb (59 kg), SpO2 100 %. General: Well dressed, well nourished in no apparent distress.   HEENT:  Normocephalic and atraumatic, no lesions.  Extraocular muscles intact. Sclerae anicteric. Pupils equal, round, reactive. No mouth sores or ulcers. Thyroid is normal size, not nodular, midline. Skin:  No lesions or rashes. Abdomen:  Soft, nontender, nondistended.  No palpable masses.  No hepatosplenomegaly.  No ascites. Normal bowel sounds. Incisions are well healed including hernia incision. Genitourinary: Normal EGBUS  No lesions. No bleeding or discharge.  No cul de sac fullness. Bladder not palpably distended with urine.  No gross prolapse noted. Pap taken.  Extremities: + left lower extremity edema. 1cm mobile, soft lesion anterior to right humoral head. Psychiatric: Mood and affect are appropriate. Neurological: Awake, alert and oriented x 3. Sensation is intact, no neuropathy.  Musculoskeletal: No pain, normal strength and range of motion.  Thereasa Solo, MD

## 2020-01-01 NOTE — Patient Instructions (Signed)
Please notify Dr Denman George at phone number [phone] if you notice vaginal bleeding, new pelvic or abdominal pains, bloating, feeling full easy, or a change in bladder or bowel function.   Please return to see Dr Denman George in 6 months.  Dr Denman George will request an ultrasound of the right should mass.

## 2020-01-01 NOTE — Addendum Note (Signed)
Addended by: Joylene John D on: 01/01/2020 01:58 PM   Modules accepted: Orders

## 2020-01-06 ENCOUNTER — Telehealth: Payer: Self-pay

## 2020-01-06 NOTE — Telephone Encounter (Signed)
Noted. Will place note with encounter form for scheduling procedure.

## 2020-01-06 NOTE — Telephone Encounter (Signed)
Received I an ok to hold Coumadin x 3 days from Dr. Stoney Bang. Pt will need Lovenox Bridge. Instructions to come from our office. Letter will be scanned in pts chart.

## 2020-01-18 ENCOUNTER — Telehealth: Payer: Self-pay | Admitting: *Deleted

## 2020-01-18 NOTE — Telephone Encounter (Signed)
Patient called back and was given appt information

## 2020-01-18 NOTE — Telephone Encounter (Signed)
Called and left the patient a message to call the office back. Need to give the appt date/time for the [place] chest

## 2020-01-22 ENCOUNTER — Ambulatory Visit (HOSPITAL_COMMUNITY)
Admission: RE | Admit: 2020-01-22 | Discharge: 2020-01-22 | Disposition: A | Discharge status: Home / Self Care | Payer: Medicare Other | Source: Ambulatory Visit | Attending: Gynecologic Oncology | Admitting: Gynecologic Oncology

## 2020-01-22 ENCOUNTER — Other Ambulatory Visit: Payer: Self-pay

## 2020-01-22 DIAGNOSIS — M7989 Other specified soft tissue disorders: Secondary | ICD-10-CM | POA: Diagnosis not present

## 2020-01-26 ENCOUNTER — Telehealth: Payer: Self-pay | Admitting: Oncology

## 2020-01-26 NOTE — Telephone Encounter (Signed)
Left a message regarding [place] results.  Requested a return call.

## 2020-01-26 NOTE — Telephone Encounter (Signed)
Meredith called back and was advised of ultrasound results which showed a lipoma.  Discussed that she can be referred to general surgery if it is bothering her.  She said she is just going to leave it alone.  Advised her to call back if it gets bigger or if she would like to be referred to general surgery.

## 2020-01-27 NOTE — Telephone Encounter (Signed)
Noted  

## 2020-02-03 ENCOUNTER — Ambulatory Visit (INDEPENDENT_AMBULATORY_CARE_PROVIDER_SITE_OTHER): Payer: Medicare Other | Admitting: Surgery

## 2020-02-03 ENCOUNTER — Encounter: Payer: Self-pay | Admitting: Surgery

## 2020-02-03 VITALS — BP 118/76 | HR 82 | Ht 62.0 in | Wt 130.0 lb

## 2020-02-03 DIAGNOSIS — M1711 Unilateral primary osteoarthritis, right knee: Secondary | ICD-10-CM

## 2020-02-03 NOTE — Progress Notes (Signed)
79 year old white female history of end-stage DJD right knee and pain comes in for preop evaluation.  Symptoms unchanged from previous visit.  She is wanting to proceed with total knee replacement as scheduled.  We received preop cardiac clearance.  Patient states that cardiologist advised patient to discontinue Coumadin 5 days before surgery and she will start Lovenox injections.  Patient states that about 6 weeks ago she had an admission x3 days to [facility] for colitis.  She currently denies any GI problems.  Today history and physical performed.  Today history physical performed.  All questions answered.

## 2020-02-04 ENCOUNTER — Telehealth: Payer: Self-pay | Admitting: *Deleted

## 2020-02-04 NOTE — Telephone Encounter (Signed)
Surgery scheduler and RNCM contacted Dr. Joselyn Arrow office and spoke with Elmo Putt, who verified while on the phone that patient should stop her Warfarin 5 days prior to her Right total knee replacement and bride with Lovenox that has been called into her pharmacy already. Patient aware of instructions.

## 2020-02-04 NOTE — Care Plan (Signed)
RNCM Pre-op call to patient to discuss her upcoming R-TKA with Dr. [name] on 02/15/20. Patient seen in office yesterday (02/03/20) while present for her H&P appointment with Dr. [name]' PA. Patient is an Ortho bundle through [facility] and is agreeable to case management. Reviewed all post-op care instructions with copy of written instructions provided as well as contact for CM. She has family that will assist after discharge. Anticipate HHPT after short hospital stay. Choice provided and referral to [facility]. Also anticipate OPPT will be needed-she has requested I set her up with [facility] PT as she did with her previous knee replacement. No DME needed. Will continue to follow.

## 2020-02-04 NOTE — Telephone Encounter (Signed)
Ortho bundle pre-op call completed. 

## 2020-02-11 ENCOUNTER — Encounter (HOSPITAL_COMMUNITY): Payer: Self-pay

## 2020-02-11 ENCOUNTER — Encounter (HOSPITAL_COMMUNITY)
Admission: RE | Admit: 2020-02-11 | Discharge: 2020-02-11 | Disposition: A | Discharge status: Home / Self Care | Payer: Medicare Other | Source: Ambulatory Visit | Attending: Orthopaedic Surgery | Admitting: Orthopaedic Surgery

## 2020-02-11 ENCOUNTER — Other Ambulatory Visit: Payer: Self-pay

## 2020-02-11 ENCOUNTER — Other Ambulatory Visit (HOSPITAL_COMMUNITY)
Admission: RE | Admit: 2020-02-11 | Discharge: 2020-02-11 | Disposition: A | Discharge status: Home / Self Care | Payer: Medicare Other | Source: Ambulatory Visit | Attending: Orthopaedic Surgery | Admitting: Orthopaedic Surgery

## 2020-02-11 DIAGNOSIS — E785 Hyperlipidemia, unspecified: Secondary | ICD-10-CM | POA: Insufficient documentation

## 2020-02-11 DIAGNOSIS — M1711 Unilateral primary osteoarthritis, right knee: Secondary | ICD-10-CM | POA: Diagnosis not present

## 2020-02-11 DIAGNOSIS — I4891 Unspecified atrial fibrillation: Secondary | ICD-10-CM | POA: Insufficient documentation

## 2020-02-11 DIAGNOSIS — Z01812 Encounter for preprocedural laboratory examination: Secondary | ICD-10-CM | POA: Diagnosis not present

## 2020-02-11 DIAGNOSIS — Z79899 Other long term (current) drug therapy: Secondary | ICD-10-CM | POA: Insufficient documentation

## 2020-02-11 DIAGNOSIS — Z20822 Contact with and (suspected) exposure to covid-19: Secondary | ICD-10-CM | POA: Insufficient documentation

## 2020-02-11 DIAGNOSIS — Z853 Personal history of malignant neoplasm of breast: Secondary | ICD-10-CM | POA: Diagnosis not present

## 2020-02-11 DIAGNOSIS — Z7901 Long term (current) use of anticoagulants: Secondary | ICD-10-CM | POA: Diagnosis not present

## 2020-02-11 DIAGNOSIS — Z87891 Personal history of nicotine dependence: Secondary | ICD-10-CM | POA: Diagnosis not present

## 2020-02-11 HISTORY — DX: Noninfective gastroenteritis and colitis, unspecified: K52.9

## 2020-02-11 HISTORY — DX: Other specified health status: Z78.9

## 2020-02-11 LAB — COMPREHENSIVE METABOLIC PANEL
ALT: <5 U/L (ref 0–44)
AST: 43 U/L — ABNORMAL HIGH (ref 15–41)
Albumin: 3.8 g/dL (ref 3.5–5.0)
Alkaline Phosphatase: 80 U/L (ref 38–126)
Anion gap: 9 (ref 5–15)
BUN: 12 mg/dL (ref 8–23)
CO2: 26 mmol/L (ref 22–32)
Calcium: 8.8 mg/dL — ABNORMAL LOW (ref 8.9–10.3)
Chloride: 102 mmol/L (ref 98–111)
Creatinine, Ser: 0.68 mg/dL (ref 0.44–1.00)
GFR calc Af Amer: >60 mL/min (ref >60)
GFR calc non Af Amer: >60 mL/min (ref >60)
Glucose, Bld: 86 mg/dL (ref 70–99)
Potassium: 5 mmol/L (ref 3.5–5.1)
Sodium: 137 mmol/L (ref 135–145)
Total Bilirubin: 0.9 mg/dL (ref 0.3–1.2)
Total Protein: 6.4 g/dL — ABNORMAL LOW (ref 6.5–8.1)

## 2020-02-11 LAB — URINALYSIS, ROUTINE W REFLEX MICROSCOPIC
Bilirubin Urine: NEGATIVE
Glucose, UA: NEGATIVE mg/dL
Hgb urine dipstick: NEGATIVE
Ketones, ur: NEGATIVE mg/dL
Nitrite: NEGATIVE
Protein, ur: NEGATIVE mg/dL
Specific Gravity, Urine: 1.004 — ABNORMAL LOW (ref 1.005–1.030)
pH: 6 (ref 5.0–8.0)

## 2020-02-11 LAB — CBC
HCT: 43.2 % (ref 36.0–46.0)
Hemoglobin: 13.7 g/dL (ref 12.0–15.0)
MCH: 28.1 pg (ref 26.0–34.0)
MCHC: 31.7 g/dL (ref 30.0–36.0)
MCV: 88.7 fL (ref 80.0–100.0)
Platelets: 351 10*3/uL (ref 150–400)
RBC: 4.87 MIL/uL (ref 3.87–5.11)
RDW: 13.5 % (ref 11.5–15.5)
WBC: 8.2 10*3/uL (ref 4.0–10.5)
nRBC: 0 % (ref 0.0–0.2)

## 2020-02-11 LAB — SURGICAL PCR SCREEN
MRSA, PCR: NEGATIVE
Staphylococcus aureus: NEGATIVE

## 2020-02-11 LAB — PROTIME-INR
INR: 1.8 — ABNORMAL HIGH (ref 0.8–1.2)
Prothrombin Time: 20.6 seconds — ABNORMAL HIGH (ref 11.4–15.2)

## 2020-02-11 LAB — SARS CORONAVIRUS 2 (TAT 6-24 HRS): SARS Coronavirus 2: NEGATIVE

## 2020-02-11 LAB — APTT: aPTT: 43 seconds — ABNORMAL HIGH (ref 24–36)

## 2020-02-11 NOTE — Progress Notes (Signed)
PCP - DR [name] Cardiologist - DR [name]   PT TO TALK TO [facility] TODAY  Chest x-ray - 12/20 EKG - 12/20 Stress Test - NA ECHO -2020  Cardiac Cath - NA  Sleep Study - NA CPAP - NA  - Blood Thinner Instructions: COUMADIN ---WITH BRIDGE STARTED ON 02/10/20 Aspirin Instructions:STOP  ERAS Protcol -YES PRE-SURGERY Ensure or G2- INSTRUCTIONS GIVEN  COVID TEST- TODAY   Anesthesia review: PT DOES SELF CATHS. HX AF  Patient denies shortness of breath, fever, cough and chest pain at PAT appointment   All instructions explained to the patient, with a verbal understanding of the material. Patient agrees to go over the instructions while at home for a better understanding. Patient also instructed to self quarantine after being tested for COVID-19. The opportunity to ask questions was provided.

## 2020-02-11 NOTE — Pre-Procedure Instructions (Signed)
Pam [name]  02/11/2020     Your procedure is scheduled on Monday, July 26..  Report to [facility], Main Entrance or Entrance "A" at 10:30 AM                   Your surgery or procedure is scheduled to begin at 12:30 PM   Call this number if you have problems the morning of surgery: [phone]  This is the number for the Pre- Surgical Desk.    For any other questions, please call [phone], Monday - Friday 8 AM - 4 PM.   Remember:  Do not  drink after midnight.  You may drink clear liquids until 9:30 AM .  Clear liquids allowed are:                    Water, Juice (non-citric and without pulp - diabetics please choose diet or no sugar options), Carbonated beverages - (diabetics please choose diet or no sugar options), Clear Tea, Black Coffee only (no creamer, milk or cream including half and half), Plain Jell-O only (diabetics please choose diet or no sugar options) and Gatorade (diabetics please choose diet or no sugar options)    Drink the Pre-Surgery Ensure between 8:30 am and 9:30 am. Nothing to drink after 9:30 am.   Take these medicines the morning of surgery with A SIP OF WATER:  diltiazem (CARDIZEM CD             fexofenadine (ALLEGRA)             metoprolol tartrate (LOPRESSOR)  If needed you may take warfarin (COUMADIN)               Follow your surgeon's instructions regarding Coumadin and Lovenox   STOP taking Aspirin, Aspirin Products (Goody Powder, Excedrin Migraine), Ibuprofen (Advil), Naproxen (Aleve), Vitamins and Herbal Products (ie Fish Oil).   Special instructions:  [place]- Preparing For Surgery  Before surgery, you can play an important role. Because skin is not sterile, your skin needs to be as free of germs as possible. You can reduce the number of germs on your skin by washing with CHG (chlorahexidine gluconate) Soap before surgery.  CHG is an antiseptic cleaner which kills germs and bonds with the skin to continue killing germs  even after washing.    Oral Hygiene is also important to reduce your risk of infection.  Remember - BRUSH YOUR TEETH THE MORNING OF SURGERY WITH YOUR REGULAR TOOTHPASTE  Please do not use if you have an allergy to CHG or antibacterial soaps. If your skin becomes reddened/irritated stop using the CHG.  Do not shave (including legs and underarms) for at least 48 hours prior to first CHG shower. It is OK to shave your face.  Please follow these instructions carefully.   1. Shower the NIGHT BEFORE SURGERY and the MORNING OF SURGERY with CHG.   2. If you chose to wash your hair, wash your hair first as usual with your normal shampoo.  3. After you shampoo, rinse your hair and body thoroughly to remove the shampoo.  4. Use CHG as you would any other liquid soap. You can apply CHG directly to the skin and wash gently with a scrungie or a clean washcloth.   5. Apply the CHG Soap to your body ONLY FROM THE NECK DOWN.  Do not use on open wounds or open sores. Avoid contact with your eyes, ears, mouth and genitals (  private parts). Wash Face and genitals (private parts)  with your normal soap.  6. Wash thoroughly, paying special attention to the area where your surgery will be performed.  7. Thoroughly rinse your body with warm water from the neck down.  8. DO NOT shower/wash with your normal soap after using and rinsing off the CHG Soap.  9. Pat yourself dry with a CLEAN TOWEL.  10. Wear CLEAN PAJAMAS to bed the night before surgery, wear comfortable clothes the morning of surgery  11. Place CLEAN SHEETS on your bed the night of your first shower and DO NOT SLEEP WITH PETS.    Day of Surgery:  Do not apply any deodorants/lotions.  Please wear clean clothes to the hospital/surgery center.   Remember to brush your teeth WITH YOUR REGULAR TOOTHPASTE.  Do not wear jewelry, make-up or nail polish.  Do not wear lotions, powders, or perfumes, or deodorant.  Do not shave 48 hours prior to  surgery.  Men may shave face and neck.  Do not bring valuables to the hospital.  [facility] is not responsible for any belongings or valuables.  Contacts, dentures or bridgework may not be worn into surgery.  Leave your suitcase in the car.  After surgery it may be brought to your room.  For patients admitted to the hospital, discharge time will be determined by your treatment team.  Please read over the following fact sheets that you were given.

## 2020-02-12 ENCOUNTER — Telehealth: Payer: Self-pay | Admitting: Orthopaedic Surgery

## 2020-02-12 ENCOUNTER — Other Ambulatory Visit: Payer: Self-pay | Admitting: Surgery

## 2020-02-12 MED ORDER — BUPIVACAINE LIPOSOME 1.3 % IJ SUSP
20.0000 mL | Freq: Once | INTRAMUSCULAR | Status: AC
  Administered 2020-02-15: 20 mL
  Filled 2020-02-12: qty 20

## 2020-02-12 MED ORDER — SULFAMETHOXAZOLE-TRIMETHOPRIM 800-160 MG PO TABS
1.0000 | ORAL_TABLET | Freq: Two times a day (BID) | ORAL | 0 refills | Status: DC
Start: 2020-02-12 — End: 2020-07-06

## 2020-02-12 NOTE — Telephone Encounter (Signed)
Patient called in wanting to know if she is to take the prescription for the UTI on the day of surgery.  Spoke with Gwinda Passe who is touch with the prescribing provider Benjiman Core, PA-C.  Instructions for patient  Include: DO NOT take the UTI Rx on the  day of surgery.  Ask the pharmacist to go over the adverse effects of the drug and what reactions to look for.  Patient states she understands instructions.

## 2020-02-12 NOTE — Anesthesia Preprocedure Evaluation (Addendum)
Anesthesia Evaluation  Patient identified by MRN, date of birth, ID band Patient awake    Reviewed: Allergy & Precautions, NPO status , Patient's Chart, lab work & pertinent test results, reviewed documented beta blocker date and time   History of Anesthesia Complications Negative for: history of anesthetic complications  Airway Mallampati: II  TM Distance: >3 FB Neck ROM: Full    Dental no notable dental hx.    Pulmonary neg recent URI, former smoker,    Pulmonary exam normal breath sounds clear to auscultation       Cardiovascular Normal cardiovascular exam+ dysrhythmias Atrial Fibrillation  Rhythm:Regular Rate:Normal  1. Left ventricular ejection fraction, by visual estimation, is 55 to  60%. The left ventricle has normal function. There is no left ventricular  hypertrophy.  2. Global right ventricle has normal systolic function.The right  ventricular size is normal. No increase in right ventricular wall  thickness.  3. Left atrial size was severely dilated.  4. Right atrial size was moderately dilated.  5. The mitral valve is normal in structure. Mild mitral valve  regurgitation. No evidence of mitral stenosis.  6. The tricuspid valve is normal in structure. Tricuspid valve  regurgitation is trivial.  7. The aortic valve is normal in structure. Aortic valve regurgitation  was not visualized by color flow Doppler.  8. The pulmonic valve was not well visualized. Pulmonic valve  regurgitation is not visualized by color flow Doppler.  9. Mildly elevated pulmonary artery systolic pressure.  10. The inferior vena cava is normal in size with greater than 50%  respiratory variability, suggesting right atrial pressure of 3 mmHg.  11. Small patent foramen ovale with predominantly left to right shunting  across the atrial septum.  12. 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  13. Evidence of atrial level shunting detected by color flow Doppler.    Neuro/Psych negative neurological ROS  negative psych ROS   GI/Hepatic negative GI ROS, Neg liver ROS,   Endo/Other  negative endocrine ROS  Renal/GU negative Renal ROS     Musculoskeletal  (+) Arthritis ,   Abdominal   Peds  Hematology negative hematology ROS (+)   Anesthesia Other Findings   Reproductive/Obstetrics                            Anesthesia Physical Anesthesia Plan  ASA: III  Anesthesia Plan: Spinal   Post-op Pain Management:  Regional for Post-op pain   Induction: Intravenous  PONV Risk Score and Plan: 2 and Ondansetron and Dexamethasone  Airway Management Planned: Simple Face Mask  Additional Equipment:   Intra-op Plan:   Post-operative Plan:   Informed Consent: I have reviewed the patients History and Physical, chart, labs and discussed the procedure including the risks, benefits and alternatives for the proposed anesthesia with the patient or authorized representative who has indicated his/her understanding and acceptance.     Dental advisory given  Plan Discussed with: CRNA and Surgeon  Anesthesia Plan Comments: (PAT note written 02/12/2020 by Myra Gianotti, PA-C. )       Anesthesia Quick Evaluation

## 2020-02-12 NOTE — Telephone Encounter (Signed)
FYI   I spoke with Jeneen Rinks. Patient has allergies to PCN and Cipro. He would like to know what patient has taken before. Per patient, she has only taken Septra, however, she was on Eliquis at the time.  Jeneen Rinks would like to know what pharmacy recommends.   I called and spoke with Chrissie Noa, pharmacist, at [facility]. We went over patient's labwork to see if Macrobid would be an option. Unfortunately, due to creatinine, it is not. After much discussion, spoke with Joellen Jersey, pharmacist, who states that if provider agrees, we could use the Septra and she will counsel the patient on reactions to watch for which would be elevated INR, bruising, bleeding.  Per Jeneen Rinks, yes, this is fine, we will use Septra.    Katie asked that hospital be aware to keep check on bruising, bleeding, and INR when there. She states that it would be a delayed reaction if patient had issues and it would probably show up in 7 days.  Patient questioned whether she would need to take on day of surgery. Per Jeneen Rinks, no.  Patient advised.

## 2020-02-12 NOTE — Telephone Encounter (Signed)
Text message sent to Jeneen Rinks to advise.

## 2020-02-12 NOTE — Progress Notes (Signed)
Anesthesia Chart Review:  Case: [id number] Date/Time: 02/15/20 1215   Procedure: RIGHT TOTAL KNEE ARTHROPLASTY (Right Knee)   Anesthesia type: Spinal   Pre-op diagnosis: right knee osteoarthritis   Location: [facility] OR ROOM 05 / [place] [facility]   Surgeons: Marybelle Killings, MD      DISCUSSION: Patient is a 79 year old female scheduled for the above procedure.  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    Last cardiology visit was on 07/29/19 with Dr. [name].  No change in therapy recommended.  1 year follow-up plan.  He is given permission to hold anticoagulation prior to 07/12/19 left TKA. She is now on warfarin which is managed by her PCP Neale Burly, MD who gave her instructions to warfarin 5 days prior to surgery with Lovenox bridge. HR 84 bpm at PAT. She denied any new cardiopulmonary symptoms or change/increast in palpitations.   UA results called to triage line at Dr. [name] office. She is on a Lovenox bridge. PT/PTT elevated, will plan to repeat on the day of surgery.   Presurgical COVID-19 test was negative on 02/11/20. Anesthesia team to evaluate on the day of surgery.    VS: BP 113/75   Pulse 84   Temp 36.9 C (Oral)   Resp 18   Ht 5\' 3"  (1.6 m)   Wt 58.7 kg   SpO2 97%   BMI 22.93 kg/m    PROVIDERS: Neale Burly, MD is PCP Minus Breeding, MD is cardiologist Everitt Amber, MD is Cristina Gong, MD is urologist Bridgette Habermann, MD is GI     LABS: Labs reviewed: Repeat PT/PTT on the day of surgery. (all labs ordered are listed, but only abnormal results are displayed)  Labs Reviewed  COMPREHENSIVE METABOLIC PANEL - Abnormal;  Notable for the following components:      Result Value   Calcium 8.8 (*)    Total Protein 6.4 (*)    AST 43 (*)    All other components within normal limits  URINALYSIS, ROUTINE W REFLEX MICROSCOPIC - Abnormal; Notable for the following components:   Specific Gravity, Urine 1.004 (*)    Leukocytes,Ua LARGE (*)    Bacteria, UA FEW (*)    All other components within normal limits  PROTIME-INR - Abnormal; Notable for the following components:   Prothrombin Time 20.6 (*)    INR 1.8 (*)    All other components within normal limits  APTT - Abnormal; Notable for the following components:   aPTT 43 (*)    All other components within normal limits  SURGICAL PCR SCREEN  CBC     IMAGES: [place] Chest 01/22/20: IMPRESSION: Soft tissue lesion in the anterior right shoulder most likely a lipoma. Clinical correlation and follow-up as clinically indicated is recommended.  1V CXR 12/14/19: FINDINGS: Stable heart size and mediastinal contours. Aortic atherosclerosis. Rounded density at the right hilum is likely vessel on end. No focal airspace disease. No pleural effusion, pulmonary edema, or pneumothorax. Surgical clips in the left axilla. Remote left rib fractures. Bones are under mineralized. IMPRESSION: No acute abnormality. Aortic Atherosclerosis (ICD10-I70.0).   EKG:07/02/19: Atrial flutter with variable A-V block with rapid ventricular response Nonspecific T wave abnormality Abnormal ECG Confirmed by  Cherlynn Kaiser [phone]) on 07/04/2019 10:33:48 AM   CV: Echo 04/23/19: IMPRESSIONS  1. Left ventricular ejection fraction, by visual estimation, is 55 to  60%. The left ventricle has normal function. There is no left ventricular  hypertrophy.  2. Global right ventricle has normal systolic function.The right  ventricular size is normal. No increase in right ventricular wall  thickness.  3. Left atrial size was severely dilated.  4. Right atrial size was moderately dilated.   5. The mitral valve is normal in structure. Mild mitral valve  regurgitation. No evidence of mitral stenosis.  6. The tricuspid valve is normal in structure. Tricuspid valve  regurgitation is trivial.  7. The aortic valve is normal in structure. Aortic valve regurgitation  was not visualized by color flow Doppler.  8. The pulmonic valve was not well visualized. Pulmonic valve  regurgitation is not visualized by color flow Doppler.  9. Mildly elevated pulmonary artery systolic pressure.  10. The inferior vena cava is normal in size with greater than 50%  respiratory variability, suggesting right atrial pressure of 3 mmHg.  11. Small patent foramen ovale with predominantly left to right shunting  across the atrial septum.  12. 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  13. Evidence of atrial level shunting detected by color flow Doppler.  - Reviewed by Dr. [name], "The patient has normal LV function. There might be a small patent foramen ovale. However, this does not change therapy or plans."   Past Medical History:  Diagnosis Date  . Arthritis   . Atrial fibrillation ([place])   . Cancer [facility])    BREAST CANCER / CERVICAL CANCER   . Cervical cancer ([place])   . Colitis   . Dysrhythmia   . History of breast cancer 2006   Dr. Sonny Dandy oncologist - left breast  . History of gout   . Hyperlipidemia   . Self-catheterizes urinary bladder   . Urinary retention Sept 2016   s/p hysterectomy    Past Surgical History:  Procedure Laterality Date  . BREAST SURGERY    . CARDIOVERSION N/A 06/03/2019   Procedure: CARDIOVERSION;  Surgeon: Jerline Pain, MD;  Location: [facility] ENDOSCOPY;  Service: Cardiovascular;  Laterality: N/A;  . CATARACT EXTRACTION W/ INTRAOCULAR LENS  IMPLANT, BILATERAL    . EYE SURGERY    . INGUINAL HERNIA REPAIR Right 12/14/2015   Procedure: LAPAROSCOPIC AND OPEN RIGHT  INGUINAL HERNIA;   Surgeon: Johnathan Hausen, MD;  Location: [facility] ORS;  Service: General;  Laterality: Right;  . KNEE ARTHROSCOPY Left   . MASTECTOMY Left 2006  . MASTECTOMY WITH AXILLARY LYMPH NODE DISSECTION Left   . ROBOTIC ASSISTED TOTAL HYSTERECTOMY WITH BILATERAL SALPINGO OOPHERECTOMY Bilateral 04/07/2015   Procedure: ROBOTIC ASSISTED RADICAL HYSTERECTOMY BILATERAL SALPINGO OOPHORECTOMY BILATERAL SENTINEL LYMPHADENETOMY;  Surgeon: Everitt Amber, MD;  Location: [facility] ORS;  Service: Gynecology;  Laterality: Bilateral;  . TOTAL KNEE ARTHROPLASTY Left 07/13/2019   Procedure: LEFT TOTAL KNEE ARTHROPLASTY-CEMENTED;  Surgeon: Marybelle Killings, MD;  Location: [place];  Service: Orthopedics;  Laterality: Left;  . TUBAL LIGATION      MEDICATIONS: . bisacodyl (DULCOLAX) 5 MG EC tablet  . diltiazem (CARDIZEM CD) 360 MG 24 hr capsule  . fexofenadine (ALLEGRA) 180 MG tablet  . gabapentin (NEURONTIN) 100 MG capsule  . metoprolol tartrate (LOPRESSOR) 25 MG tablet  . Probiotic Product (ALIGN) 4 MG CAPS  . rOPINIRole (REQUIP) 1 MG tablet  .  traMADol (ULTRAM) 50 MG tablet  . warfarin (COUMADIN) 5 MG tablet   No current facility-administered medications for this encounter.    Myra Gianotti, PA-C Surgical Short Stay/Anesthesiology [facility] Phone [phone] [facility] Phone [phone] 02/12/2020 12:32 PM

## 2020-02-12 NOTE — Telephone Encounter (Signed)
[facility] called. Says patient is currently taking Warfarn and was prescribed Septra which interacts with Warfarn. Would like confirmation from Jeneen Rinks to prescribe the Septra. The call back number is [phone]

## 2020-02-15 ENCOUNTER — Observation Stay (HOSPITAL_COMMUNITY): Payer: Medicare Other

## 2020-02-15 ENCOUNTER — Ambulatory Visit (HOSPITAL_COMMUNITY): Payer: Medicare Other | Admitting: Vascular Surgery

## 2020-02-15 ENCOUNTER — Encounter (HOSPITAL_COMMUNITY): Payer: Self-pay | Admitting: Orthopaedic Surgery

## 2020-02-15 ENCOUNTER — Observation Stay (HOSPITAL_COMMUNITY)
Admission: RE | Admit: 2020-02-15 | Discharge: 2020-02-16 | Disposition: A | Discharge status: Home / Self Care | Payer: Medicare Other | Attending: Orthopaedic Surgery | Admitting: Orthopaedic Surgery

## 2020-02-15 ENCOUNTER — Other Ambulatory Visit: Payer: Self-pay

## 2020-02-15 ENCOUNTER — Encounter (HOSPITAL_COMMUNITY)
Admission: RE | Disposition: A | Discharge status: Home / Self Care | Payer: Self-pay | Source: Home / Self Care | Attending: Orthopaedic Surgery

## 2020-02-15 ENCOUNTER — Ambulatory Visit (HOSPITAL_COMMUNITY): Payer: Medicare Other

## 2020-02-15 DIAGNOSIS — Z96651 Presence of right artificial knee joint: Secondary | ICD-10-CM | POA: Diagnosis not present

## 2020-02-15 DIAGNOSIS — I4819 Other persistent atrial fibrillation: Secondary | ICD-10-CM | POA: Insufficient documentation

## 2020-02-15 DIAGNOSIS — Z87891 Personal history of nicotine dependence: Secondary | ICD-10-CM | POA: Insufficient documentation

## 2020-02-15 DIAGNOSIS — Z471 Aftercare following joint replacement surgery: Secondary | ICD-10-CM | POA: Diagnosis not present

## 2020-02-15 DIAGNOSIS — Z853 Personal history of malignant neoplasm of breast: Secondary | ICD-10-CM | POA: Insufficient documentation

## 2020-02-15 DIAGNOSIS — Z8541 Personal history of malignant neoplasm of cervix uteri: Secondary | ICD-10-CM | POA: Insufficient documentation

## 2020-02-15 DIAGNOSIS — Z96653 Presence of artificial knee joint, bilateral: Secondary | ICD-10-CM | POA: Diagnosis not present

## 2020-02-15 DIAGNOSIS — Z01818 Encounter for other preprocedural examination: Secondary | ICD-10-CM

## 2020-02-15 DIAGNOSIS — M1711 Unilateral primary osteoarthritis, right knee: Secondary | ICD-10-CM | POA: Diagnosis not present

## 2020-02-15 DIAGNOSIS — Z79899 Other long term (current) drug therapy: Secondary | ICD-10-CM | POA: Diagnosis not present

## 2020-02-15 DIAGNOSIS — Z9889 Other specified postprocedural states: Secondary | ICD-10-CM | POA: Diagnosis not present

## 2020-02-15 DIAGNOSIS — G8918 Other acute postprocedural pain: Secondary | ICD-10-CM | POA: Diagnosis not present

## 2020-02-15 DIAGNOSIS — E785 Hyperlipidemia, unspecified: Secondary | ICD-10-CM | POA: Diagnosis not present

## 2020-02-15 DIAGNOSIS — Z09 Encounter for follow-up examination after completed treatment for conditions other than malignant neoplasm: Secondary | ICD-10-CM

## 2020-02-15 DIAGNOSIS — I480 Paroxysmal atrial fibrillation: Secondary | ICD-10-CM | POA: Diagnosis not present

## 2020-02-15 DIAGNOSIS — R600 Localized edema: Secondary | ICD-10-CM | POA: Diagnosis not present

## 2020-02-15 DIAGNOSIS — M25561 Pain in right knee: Secondary | ICD-10-CM | POA: Diagnosis present

## 2020-02-15 HISTORY — PX: TOTAL KNEE ARTHROPLASTY: SHX125

## 2020-02-15 LAB — CBC
HCT: 37.8 % (ref 36.0–46.0)
Hemoglobin: 12 g/dL (ref 12.0–15.0)
MCH: 27.8 pg (ref 26.0–34.0)
MCHC: 31.7 g/dL (ref 30.0–36.0)
MCV: 87.5 fL (ref 80.0–100.0)
Platelets: 259 10*3/uL (ref 150–400)
RBC: 4.32 MIL/uL (ref 3.87–5.11)
RDW: 13.4 % (ref 11.5–15.5)
WBC: 10.1 10*3/uL (ref 4.0–10.5)
nRBC: 0 % (ref 0.0–0.2)

## 2020-02-15 LAB — APTT: aPTT: 30 seconds (ref 24–36)

## 2020-02-15 LAB — CREATININE, SERUM
Creatinine, Ser: 0.81 mg/dL (ref 0.44–1.00)
GFR calc Af Amer: >60 mL/min (ref >60)
GFR calc non Af Amer: >60 mL/min (ref >60)

## 2020-02-15 LAB — PROTIME-INR
INR: 1 (ref 0.8–1.2)
Prothrombin Time: 13 seconds (ref 11.4–15.2)

## 2020-02-15 SURGERY — ARTHROPLASTY, KNEE, TOTAL
Anesthesia: Spinal | Site: Knee | Laterality: Right

## 2020-02-15 MED ORDER — ACETAMINOPHEN 500 MG PO TABS: 1000.0000 mg | ORAL_TABLET | Freq: Once | ORAL | Status: DC | PRN

## 2020-02-15 MED ORDER — TRANEXAMIC ACID 1000 MG/10ML IV SOLN
INTRAVENOUS | Status: DC | PRN
  Administered 2020-02-15: 2000 mg via TOPICAL

## 2020-02-15 MED ORDER — ONDANSETRON HCL 4 MG/2ML IJ SOLN
INTRAMUSCULAR | Status: AC
  Filled 2020-02-15: qty 2

## 2020-02-15 MED ORDER — TRANEXAMIC ACID 1000 MG/10ML IV SOLN
2000.0000 mg | Freq: Once | INTRAVENOUS | Status: DC
  Filled 2020-02-15: qty 20

## 2020-02-15 MED ORDER — ONDANSETRON HCL 4 MG/2ML IJ SOLN
INTRAMUSCULAR | Status: DC | PRN
  Administered 2020-02-15: 4 mg via INTRAVENOUS

## 2020-02-15 MED ORDER — PROPOFOL 500 MG/50ML IV EMUL
INTRAVENOUS | Status: DC | PRN
  Administered 2020-02-15: 80 ug/kg/min via INTRAVENOUS

## 2020-02-15 MED ORDER — OXYCODONE HCL 5 MG PO TABS: 5.0000 mg | ORAL_TABLET | Freq: Once | ORAL | Status: DC | PRN

## 2020-02-15 MED ORDER — METHOCARBAMOL 500 MG PO TABS
500.0000 mg | ORAL_TABLET | Freq: Four times a day (QID) | ORAL | Status: DC | PRN
  Administered 2020-02-15 – 2020-02-16 (×2): 500 mg via ORAL
  Filled 2020-02-15 (×3): qty 1

## 2020-02-15 MED ORDER — WARFARIN SODIUM 5 MG PO TABS: 5.0000 mg | ORAL_TABLET | Freq: Once | ORAL | Status: DC

## 2020-02-15 MED ORDER — BUPIVACAINE HCL (PF) 0.25 % IJ SOLN
INTRAMUSCULAR | Status: AC
  Filled 2020-02-15: qty 30

## 2020-02-15 MED ORDER — SODIUM CHLORIDE 0.9 % IR SOLN
Status: DC | PRN
  Administered 2020-02-15: 3000 mL

## 2020-02-15 MED ORDER — METOCLOPRAMIDE HCL 5 MG/ML IJ SOLN: 5.0000 mg | Freq: Three times a day (TID) | INTRAMUSCULAR | Status: DC | PRN

## 2020-02-15 MED ORDER — ONDANSETRON HCL 4 MG PO TABS: 4.0000 mg | ORAL_TABLET | Freq: Four times a day (QID) | ORAL | Status: DC | PRN

## 2020-02-15 MED ORDER — PHENOL 1.4 % MT LIQD: 1.0000 | OROMUCOSAL | Status: DC | PRN

## 2020-02-15 MED ORDER — PHENYLEPHRINE 40 MCG/ML (10ML) SYRINGE FOR IV PUSH (FOR BLOOD PRESSURE SUPPORT)
PREFILLED_SYRINGE | INTRAVENOUS | Status: AC
  Filled 2020-02-15: qty 10

## 2020-02-15 MED ORDER — RISAQUAD PO CAPS
1.0000 | ORAL_CAPSULE | Freq: Every day | ORAL | Status: DC
  Administered 2020-02-15 – 2020-02-16 (×2): 1 via ORAL
  Filled 2020-02-15 (×2): qty 1

## 2020-02-15 MED ORDER — FENTANYL CITRATE (PF) 100 MCG/2ML IJ SOLN
INTRAMUSCULAR | Status: AC
  Administered 2020-02-15: 75 ug via INTRAVENOUS
  Filled 2020-02-15: qty 2

## 2020-02-15 MED ORDER — CEFAZOLIN SODIUM 1 G IJ SOLR
INTRAMUSCULAR | Status: AC
  Filled 2020-02-15: qty 20

## 2020-02-15 MED ORDER — ENOXAPARIN SODIUM 40 MG/0.4ML ~~LOC~~ SOLN
40.0000 mg | SUBCUTANEOUS | Status: DC
  Filled 2020-02-15: qty 0.4

## 2020-02-15 MED ORDER — CEFAZOLIN SODIUM-DEXTROSE 1-4 GM/50ML-% IV SOLN
INTRAVENOUS | Status: DC | PRN
  Administered 2020-02-15: 2 g via INTRAVENOUS

## 2020-02-15 MED ORDER — FENTANYL CITRATE (PF) 100 MCG/2ML IJ SOLN
INTRAMUSCULAR | Status: AC
  Filled 2020-02-15: qty 2

## 2020-02-15 MED ORDER — MENTHOL 3 MG MT LOZG: 1.0000 | LOZENGE | OROMUCOSAL | Status: DC | PRN

## 2020-02-15 MED ORDER — ORAL CARE MOUTH RINSE: 15.0000 mL | Freq: Once | OROMUCOSAL | Status: AC

## 2020-02-15 MED ORDER — WARFARIN - PHARMACIST DOSING INPATIENT: Freq: Every day | Status: DC

## 2020-02-15 MED ORDER — ACETAMINOPHEN 160 MG/5ML PO SOLN: 1000.0000 mg | Freq: Once | ORAL | Status: DC | PRN

## 2020-02-15 MED ORDER — PROPOFOL 10 MG/ML IV BOLUS
INTRAVENOUS | Status: DC | PRN
  Administered 2020-02-15: 20 mg via INTRAVENOUS

## 2020-02-15 MED ORDER — MIDAZOLAM HCL 2 MG/2ML IJ SOLN
INTRAMUSCULAR | Status: AC
  Filled 2020-02-15: qty 2

## 2020-02-15 MED ORDER — TRANEXAMIC ACID 1000 MG/10ML IV SOLN: INTRAVENOUS | Status: DC | PRN

## 2020-02-15 MED ORDER — VANCOMYCIN HCL IN DEXTROSE 1-5 GM/200ML-% IV SOLN
1000.0000 mg | INTRAVENOUS | Status: AC
  Administered 2020-02-15: 1000 mg via INTRAVENOUS
  Filled 2020-02-15: qty 200

## 2020-02-15 MED ORDER — ACETAMINOPHEN 325 MG PO TABS: 325.0000 mg | ORAL_TABLET | Freq: Four times a day (QID) | ORAL | Status: DC | PRN

## 2020-02-15 MED ORDER — METHOCARBAMOL 1000 MG/10ML IJ SOLN
500.0000 mg | Freq: Four times a day (QID) | INTRAVENOUS | Status: DC | PRN
  Filled 2020-02-15: qty 5

## 2020-02-15 MED ORDER — METOPROLOL TARTRATE 25 MG PO TABS
25.0000 mg | ORAL_TABLET | Freq: Two times a day (BID) | ORAL | Status: DC
  Administered 2020-02-15 – 2020-02-16 (×2): 25 mg via ORAL
  Filled 2020-02-15 (×2): qty 1

## 2020-02-15 MED ORDER — 0.9 % SODIUM CHLORIDE (POUR BTL) OPTIME
TOPICAL | Status: DC | PRN
  Administered 2020-02-15: 1000 mL

## 2020-02-15 MED ORDER — VANCOMYCIN HCL IN DEXTROSE 1-5 GM/200ML-% IV SOLN
1000.0000 mg | Freq: Two times a day (BID) | INTRAVENOUS | Status: AC
  Administered 2020-02-15: 1000 mg via INTRAVENOUS
  Filled 2020-02-15: qty 200

## 2020-02-15 MED ORDER — ACETAMINOPHEN 10 MG/ML IV SOLN
1000.0000 mg | Freq: Once | INTRAVENOUS | Status: DC | PRN
  Administered 2020-02-15: 1000 mg via INTRAVENOUS

## 2020-02-15 MED ORDER — LACTATED RINGERS IV SOLN: INTRAVENOUS | Status: DC

## 2020-02-15 MED ORDER — DILTIAZEM HCL ER COATED BEADS 180 MG PO CP24
360.0000 mg | ORAL_CAPSULE | Freq: Every day | ORAL | Status: DC
  Administered 2020-02-15 – 2020-02-16 (×2): 360 mg via ORAL
  Filled 2020-02-15 (×2): qty 2

## 2020-02-15 MED ORDER — BUPIVACAINE HCL (PF) 0.25 % IJ SOLN
INTRAMUSCULAR | Status: DC | PRN
  Administered 2020-02-15: 20 mL

## 2020-02-15 MED ORDER — ROPINIROLE HCL 0.5 MG PO TABS
1.0000 mg | ORAL_TABLET | Freq: Every day | ORAL | Status: DC
  Administered 2020-02-15: 1 mg via ORAL
  Filled 2020-02-15: qty 2

## 2020-02-15 MED ORDER — DOCUSATE SODIUM 100 MG PO CAPS
100.0000 mg | ORAL_CAPSULE | Freq: Two times a day (BID) | ORAL | Status: DC
  Administered 2020-02-15 – 2020-02-16 (×2): 100 mg via ORAL
  Filled 2020-02-15 (×2): qty 1

## 2020-02-15 MED ORDER — ACETAMINOPHEN 10 MG/ML IV SOLN
INTRAVENOUS | Status: AC
  Filled 2020-02-15: qty 100

## 2020-02-15 MED ORDER — PROPOFOL 10 MG/ML IV BOLUS
INTRAVENOUS | Status: AC
  Filled 2020-02-15: qty 20

## 2020-02-15 MED ORDER — FENTANYL CITRATE (PF) 100 MCG/2ML IJ SOLN: 75.0000 ug | Freq: Once | INTRAMUSCULAR | Status: AC

## 2020-02-15 MED ORDER — BUPIVACAINE IN DEXTROSE 0.75-8.25 % IT SOLN
INTRATHECAL | Status: DC | PRN
  Administered 2020-02-15: 1.6 mL via INTRATHECAL

## 2020-02-15 MED ORDER — LORATADINE 10 MG PO TABS
10.0000 mg | ORAL_TABLET | Freq: Every day | ORAL | Status: DC
  Administered 2020-02-15 – 2020-02-16 (×2): 10 mg via ORAL
  Filled 2020-02-15 (×2): qty 1

## 2020-02-15 MED ORDER — SODIUM CHLORIDE 0.9 % IV SOLN: INTRAVENOUS | Status: DC

## 2020-02-15 MED ORDER — HYDROMORPHONE HCL 1 MG/ML IJ SOLN
0.5000 mg | INTRAMUSCULAR | Status: DC | PRN
  Administered 2020-02-15: 0.5 mg via INTRAVENOUS
  Filled 2020-02-15: qty 1

## 2020-02-15 MED ORDER — PHENYLEPHRINE HCL-NACL 10-0.9 MG/250ML-% IV SOLN
INTRAVENOUS | Status: DC | PRN
  Administered 2020-02-15: 50 ug/min via INTRAVENOUS

## 2020-02-15 MED ORDER — OXYCODONE HCL 5 MG PO TABS
5.0000 mg | ORAL_TABLET | ORAL | Status: DC | PRN
  Administered 2020-02-15 – 2020-02-16 (×3): 5 mg via ORAL
  Filled 2020-02-15 (×4): qty 1

## 2020-02-15 MED ORDER — ONDANSETRON HCL 4 MG/2ML IJ SOLN: 4.0000 mg | Freq: Four times a day (QID) | INTRAMUSCULAR | Status: DC | PRN

## 2020-02-15 MED ORDER — GABAPENTIN 100 MG PO CAPS
100.0000 mg | ORAL_CAPSULE | Freq: Every day | ORAL | Status: DC
  Administered 2020-02-15: 100 mg via ORAL
  Filled 2020-02-15: qty 1

## 2020-02-15 MED ORDER — METOCLOPRAMIDE HCL 5 MG PO TABS: 5.0000 mg | ORAL_TABLET | Freq: Three times a day (TID) | ORAL | Status: DC | PRN

## 2020-02-15 MED ORDER — PHENYLEPHRINE 40 MCG/ML (10ML) SYRINGE FOR IV PUSH (FOR BLOOD PRESSURE SUPPORT)
PREFILLED_SYRINGE | INTRAVENOUS | Status: DC | PRN
  Administered 2020-02-15 (×2): 80 ug via INTRAVENOUS

## 2020-02-15 MED ORDER — FENTANYL CITRATE (PF) 250 MCG/5ML IJ SOLN
INTRAMUSCULAR | Status: AC
  Filled 2020-02-15: qty 5

## 2020-02-15 MED ORDER — BUPIVACAINE HCL (PF) 0.5 % IJ SOLN
INTRAMUSCULAR | Status: DC | PRN
Start: 2020-02-15 — End: 2020-02-15
  Administered 2020-02-15: 20 mL via PERINEURAL

## 2020-02-15 MED ORDER — FENTANYL CITRATE (PF) 100 MCG/2ML IJ SOLN
25.0000 ug | INTRAMUSCULAR | Status: DC | PRN
  Administered 2020-02-15 (×2): 25 ug via INTRAVENOUS

## 2020-02-15 MED ORDER — CHLORHEXIDINE GLUCONATE 0.12 % MT SOLN
15.0000 mL | Freq: Once | OROMUCOSAL | Status: AC
  Administered 2020-02-15: 15 mL via OROMUCOSAL
  Filled 2020-02-15: qty 15

## 2020-02-15 MED ORDER — OXYCODONE HCL 5 MG/5ML PO SOLN: 5.0000 mg | Freq: Once | ORAL | Status: DC | PRN

## 2020-02-15 SURGICAL SUPPLY — 75 items
APL SKNCLS STERI-STRIP NONHPOA (GAUZE/BANDAGES/DRESSINGS) ×1
ATTUNE PS FEM RT SZ 5 CEM KNEE (Femur) ×2 IMPLANT
ATTUNE PSRP INSR SZ5 5 KNEE (Insert) ×1 IMPLANT
ATTUNE PSRP INSR SZ5 5MM KNEE (Insert) ×1 IMPLANT
BANDAGE ESMARK 6X9 LF (GAUZE/BANDAGES/DRESSINGS) ×1 IMPLANT
BASE TIBIAL ROT PLAT SZ 5 KNEE (Knees) IMPLANT
BENZOIN TINCTURE PRP APPL 2/3 (GAUZE/BANDAGES/DRESSINGS) ×3 IMPLANT
BLADE SAGITTAL 25.0X1.19X90 (BLADE) ×2 IMPLANT
BLADE SAGITTAL 25.0X1.19X90MM (BLADE) ×1
BLADE SAW SGTL 13X75X1.27 (BLADE) ×3 IMPLANT
BNDG CMPR 9X6 STRL LF SNTH (GAUZE/BANDAGES/DRESSINGS) ×1
BNDG ELASTIC 4X5.8 VLCR STR LF (GAUZE/BANDAGES/DRESSINGS) ×3 IMPLANT
BNDG ELASTIC 6X5.8 VLCR STR LF (GAUZE/BANDAGES/DRESSINGS) ×2 IMPLANT
BNDG ESMARK 6X9 LF (GAUZE/BANDAGES/DRESSINGS) ×3
BOWL SMART MIX CTS (DISPOSABLE) ×3 IMPLANT
BSPLAT TIB 5 CMNT ROT PLAT STR (Knees) ×1 IMPLANT
CEMENT HV SMART SET (Cement) ×6 IMPLANT
COVER SURGICAL LIGHT HANDLE (MISCELLANEOUS) ×3 IMPLANT
COVER WAND RF STERILE (DRAPES) ×3 IMPLANT
CUFF TOURN SGL QUICK 34 (TOURNIQUET CUFF) ×3
CUFF TRNQT CYL 34X4.125X (TOURNIQUET CUFF) ×1 IMPLANT
DRAPE ORTHO SPLIT 77X108 STRL (DRAPES) ×6
DRAPE SURG ORHT 6 SPLT 77X108 (DRAPES) ×2 IMPLANT
DRAPE U-SHAPE 47X51 STRL (DRAPES) ×3 IMPLANT
DRSG PAD ABDOMINAL 8X10 ST (GAUZE/BANDAGES/DRESSINGS) ×3 IMPLANT
DRSG XEROFORM 1X8 (GAUZE/BANDAGES/DRESSINGS) ×3 IMPLANT
DURAPREP 26ML APPLICATOR (WOUND CARE) ×6 IMPLANT
ELECT REM PT RETURN 9FT ADLT (ELECTROSURGICAL) ×3
ELECTRODE REM PT RTRN 9FT ADLT (ELECTROSURGICAL) ×1 IMPLANT
FACESHIELD WRAPAROUND (MASK) ×9 IMPLANT
GAUZE SPONGE 4X4 12PLY STRL (GAUZE/BANDAGES/DRESSINGS) ×3 IMPLANT
GAUZE XEROFORM 5X9 LF (GAUZE/BANDAGES/DRESSINGS) ×3 IMPLANT
GLOVE BIOGEL PI IND STRL 8 (GLOVE) ×2 IMPLANT
GLOVE BIOGEL PI INDICATOR 8 (GLOVE) ×4
GLOVE ORTHO TXT STRL SZ7.5 (GLOVE) ×6 IMPLANT
GOWN STRL REUS W/ TWL LRG LVL3 (GOWN DISPOSABLE) ×1 IMPLANT
GOWN STRL REUS W/ TWL XL LVL3 (GOWN DISPOSABLE) ×1 IMPLANT
GOWN STRL REUS W/TWL 2XL LVL3 (GOWN DISPOSABLE) ×3 IMPLANT
GOWN STRL REUS W/TWL LRG LVL3 (GOWN DISPOSABLE) ×3
GOWN STRL REUS W/TWL XL LVL3 (GOWN DISPOSABLE) ×6
HANDPIECE INTERPULSE COAX TIP (DISPOSABLE) ×3
IMMOBILIZER KNEE 22 UNIV (SOFTGOODS) ×3 IMPLANT
KIT BASIN OR (CUSTOM PROCEDURE TRAY) ×3 IMPLANT
KIT TURNOVER KIT B (KITS) ×3 IMPLANT
MANIFOLD NEPTUNE II (INSTRUMENTS) ×3 IMPLANT
MARKER SKIN DUAL TIP RULER LAB (MISCELLANEOUS) ×3 IMPLANT
NDL 18GX1X1/2 (RX/OR ONLY) (NEEDLE) ×1 IMPLANT
NEEDLE 18GX1X1/2 (RX/OR ONLY) (NEEDLE) ×3 IMPLANT
NEEDLE HYPO 25GX1X1/2 BEV (NEEDLE) ×3 IMPLANT
NS IRRIG 1000ML POUR BTL (IV SOLUTION) ×3 IMPLANT
PACK TOTAL JOINT (CUSTOM PROCEDURE TRAY) ×3 IMPLANT
PAD ARMBOARD 7.5X6 YLW CONV (MISCELLANEOUS) ×6 IMPLANT
PAD CAST 4YDX4 CTTN HI CHSV (CAST SUPPLIES) ×1 IMPLANT
PADDING CAST COTTON 4X4 STRL (CAST SUPPLIES) ×3
PADDING CAST COTTON 6X4 STRL (CAST SUPPLIES) ×3 IMPLANT
PATELLA MEDIAL ATTUN 35MM KNEE (Knees) ×2 IMPLANT
PIN FIX SIGMA LCS THRD HI (PIN) ×2 IMPLANT
PIN STEINMAN FIXATION KNEE (PIN) ×2 IMPLANT
SET HNDPC FAN SPRY TIP SCT (DISPOSABLE) ×1 IMPLANT
STAPLER VISISTAT 35W (STAPLE) IMPLANT
SUCTION FRAZIER HANDLE 10FR (MISCELLANEOUS) ×3
SUCTION TUBE FRAZIER 10FR DISP (MISCELLANEOUS) ×1 IMPLANT
SUT VIC AB 0 CT1 27 (SUTURE) ×3
SUT VIC AB 0 CT1 27XBRD ANBCTR (SUTURE) ×1 IMPLANT
SUT VIC AB 1 CTX 36 (SUTURE) ×6
SUT VIC AB 1 CTX36XBRD ANBCTR (SUTURE) ×2 IMPLANT
SUT VIC AB 2-0 CT1 27 (SUTURE) ×6
SUT VIC AB 2-0 CT1 TAPERPNT 27 (SUTURE) ×2 IMPLANT
SUT VIC AB 3-0 X1 27 (SUTURE) ×3 IMPLANT
SYR 50ML LL SCALE MARK (SYRINGE) ×3 IMPLANT
SYR CONTROL 10ML LL (SYRINGE) ×3 IMPLANT
TIBIAL BASE ROT PLAT SZ 5 KNEE (Knees) ×3 IMPLANT
TOWEL GREEN STERILE (TOWEL DISPOSABLE) ×3 IMPLANT
TOWEL GREEN STERILE FF (TOWEL DISPOSABLE) ×3 IMPLANT
TRAY CATH 16FR W/PLASTIC CATH (SET/KITS/TRAYS/PACK) ×2 IMPLANT

## 2020-02-15 NOTE — Transfer of Care (Signed)
Immediate Anesthesia Transfer of Care Note  Patient: Pam [name]  Procedure(s) Performed: RIGHT TOTAL KNEE ARTHROPLASTY (Right Knee)  Patient Location: PACU  Anesthesia Type:Spinal  Level of Consciousness: awake, alert  and patient cooperative  Airway & Oxygen Therapy: Patient Spontanous Breathing and Patient connected to nasal cannula oxygen  Post-op Assessment: Report given to RN and Post -op Vital signs reviewed and stable  Post vital signs: Reviewed and stable  Last Vitals:  Vitals Value Taken Time  BP 112/71 02/15/20 1435  Temp    Pulse 68 02/15/20 1434  Resp 13 02/15/20 1435  SpO2 90 % 02/15/20 1434  Vitals shown include unvalidated device data.  Last Pain:  Vitals:   02/15/20 1121  TempSrc:   PainSc: 7       Patients Stated Pain Goal: 3 (59/45/85 9292)  Complications: No complications documented.

## 2020-02-15 NOTE — Anesthesia Procedure Notes (Signed)
Anesthesia Procedure Image    

## 2020-02-15 NOTE — H&P (Signed)
TOTAL KNEE ADMISSION H&P   Patient is being admitted for right total knee arthroplasty.  Subjective:  Chief Complaint:right knee pain.  HPI: Pam [name], 79 y.o. female, has a history of pain and functional disability in the right knee due to arthritis and has failed non-surgical conservative treatments for greater than 12 weeks to includeNSAID's and/or analgesics, corticosteriod injections, use of assistive devices and activity modification.  Onset of symptoms was gradual, starting 10 years ago with gradually worsening course since that time.   Patient currently rates pain in the right knee(s) at 10 out of 10 with activity. Patient has night pain, worsening of pain with activity and weight bearing, pain that interferes with activities of daily living, crepitus and joint swelling.  Patient has evidence of subchondral cysts, subchondral sclerosis, periarticular osteophytes and joint space narrowing by imaging studies.There is no active infection.  Patient Active Problem List   Diagnosis Date Noted  . Diarrhea 12/30/2019  . Acute abdominal pain 12/14/2019  . Acute lower UTI 12/14/2019  . Colitis 12/14/2019  . Unilateral primary osteoarthritis, right knee 08/27/2019  . S/P total knee arthroplasty, 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  . Educated about COVID-19 virus infection 07/27/2019  . Persistent atrial fibrillation ([place]) 06/07/2019  . PAF (paroxysmal atrial fibrillation) ([place])   . Candidiasis, vagina 01/02/2019  . Hernia of abdominal cavity 10/07/2015  . Overflow incontinence of urine 07/04/2015  . Urinary retention with incomplete bladder emptying 05/09/2015  . Cervical cancer ([place]) 03/23/2015   Past Medical History:  Diagnosis Date  . Arthritis   . Atrial fibrillation ([place])   . Cancer [facility])    BREAST CANCER / CERVICAL CANCER   . Cervical cancer ([place])   . Colitis   . Dysrhythmia   . History of breast cancer 2006   Dr. Sonny Dandy oncologist - left breast  . History of gout   . Hyperlipidemia   .  Self-catheterizes urinary bladder   . Urinary retention Sept 2016   s/p hysterectomy    Past Surgical History:  Procedure Laterality Date  . BREAST SURGERY    . CARDIOVERSION N/A 06/03/2019   Procedure: CARDIOVERSION;  Surgeon: Jerline Pain, MD;  Location: [facility] ENDOSCOPY;  Service: Cardiovascular;  Laterality: N/A;  . CATARACT EXTRACTION W/ INTRAOCULAR LENS  IMPLANT, BILATERAL    . EYE SURGERY    . INGUINAL HERNIA REPAIR Right 12/14/2015   Procedure: LAPAROSCOPIC AND OPEN RIGHT  INGUINAL HERNIA;  Surgeon: Johnathan Hausen, MD;  Location: [facility] ORS;  Service: General;  Laterality: Right;  . KNEE ARTHROSCOPY Left   . MASTECTOMY Left 2006  . MASTECTOMY WITH AXILLARY LYMPH NODE DISSECTION Left   . ROBOTIC ASSISTED TOTAL HYSTERECTOMY WITH BILATERAL SALPINGO OOPHERECTOMY Bilateral 04/07/2015   Procedure: ROBOTIC ASSISTED RADICAL HYSTERECTOMY BILATERAL SALPINGO OOPHORECTOMY BILATERAL SENTINEL LYMPHADENETOMY;  Surgeon: Everitt Amber, MD;  Location: [facility] ORS;  Service: Gynecology;  Laterality: Bilateral;  . TOTAL KNEE ARTHROPLASTY Left 07/13/2019   Procedure: LEFT TOTAL KNEE ARTHROPLASTY-CEMENTED;  Surgeon: Marybelle Killings, MD;  Location: [place];  Service: Orthopedics;  Laterality: Left;  . TUBAL LIGATION      Current Facility-Administered Medications  Medication Dose Route Frequency Provider Last Rate Last Admin  . bupivacaine liposome (EXPAREL) 1.3 % injection 266 mg  20 mL Infiltration Once Marybelle Killings, MD       Current Outpatient Medications  Medication Sig Dispense Refill Last Dose  . bisacodyl (DULCOLAX) 5 MG EC tablet Take 5 mg by mouth daily as needed for mild constipation.      Marland Kitchen  diltiazem (CARDIZEM CD) 360 MG 24 hr capsule Take 1 capsule (360 mg total) by mouth daily. 90 capsule 3   . fexofenadine (ALLEGRA) 180 MG tablet Take 180 mg by mouth daily.     Marland Kitchen gabapentin (NEURONTIN) 100 MG capsule Take 100 mg by mouth at bedtime.  1   . metoprolol tartrate (LOPRESSOR) 25 MG tablet Take 1 tablet (25 mg  total) by mouth 2 (two) times daily. 180 tablet 3   . Probiotic Product (ALIGN) 4 MG CAPS Take 4 mg by mouth daily.     Marland Kitchen rOPINIRole (REQUIP) 1 MG tablet Take 1 mg by mouth at bedtime.  0   . traMADol (ULTRAM) 50 MG tablet Take 50 mg by mouth 2 (two) times daily as needed for moderate pain.      Marland Kitchen warfarin (COUMADIN) 5 MG tablet Take 5 mg by mouth one time only at 4 PM.      . sulfamethoxazole-trimethoprim (BACTRIM DS) 800-160 MG tablet Take 1 tablet by mouth 2 (two) times daily. 20 tablet 0    Allergies  Allergen Reactions  . Aspirin Nausea And Vomiting  . Ciprofloxacin Diarrhea  . Flagyl [Metronidazole] Diarrhea  . Penicillins Hives    20 years     Social History   Tobacco Use  . Smoking status: Former Smoker    Packs/day: 1.00    Years: 30.00    Pack years: 30.00    Types: Cigarettes    Quit date: 07/23/1994    Years since quitting: 25.5  . Smokeless tobacco: Never Used  Substance Use Topics  . Alcohol use: Never    Alcohol/week: 0.0 standard drinks    Family History  Problem Relation Age of Onset  . Prostate cancer Father   . Heart failure Father        66s  . Heart failure Mother        65  . Breast cancer Sister   . Colon polyps Sister        early 66s   . Colon cancer Neg Hx      Review of Systems  Constitutional: Positive for activity change.  HENT: Negative.   Respiratory: Negative.   Cardiovascular: Negative.   Genitourinary: Negative.   Musculoskeletal: Positive for gait problem and joint swelling.    Objective:  Physical Exam HENT:     Head: Normocephalic and atraumatic.  Pulmonary:     Effort: Pulmonary effort is normal. No respiratory distress.  Abdominal:     General: Bowel sounds are normal. There is no distension.  Musculoskeletal:        General: Swelling and tenderness present.  Skin:    General: Skin is warm and dry.  Neurological:     General: No focal deficit present.     Mental Status: She is alert and oriented to person, place,  and time.  Psychiatric:        Mood and Affect: Mood normal.     Vital signs in last 24 hours:    Labs:   Estimated body mass index is 22.93 kg/m as calculated from the following:   Height as of 02/11/20: 5\' 3"  (1.6 m).   Weight as of 02/11/20: 58.7 kg.   Imaging Review Plain radiographs demonstrate moderate degenerative joint disease of the right knee(s). The overall alignment ismild varus. The bone quality appears to be good for age and reported activity level.      Assessment/Plan:  End stage arthritis, right knee   The patient history,  physical examination, clinical judgment of the provider and imaging studies are consistent with end stage degenerative joint disease of the right knee(s) and total knee arthroplasty is deemed medically necessary. The treatment options including medical management, injection therapy arthroscopy and arthroplasty were discussed at length. The risks and benefits of total knee arthroplasty were presented and reviewed. The risks due to aseptic loosening, infection, stiffness, patella tracking problems, thromboembolic complications and other imponderables were discussed. The patient acknowledged the explanation, agreed to proceed with the plan and consent was signed. Patient is being admitted for inpatient treatment for surgery, pain control, PT, OT, prophylactic antibiotics, VTE prophylaxis, progressive ambulation and ADL's and discharge planning. The patient is planning to be discharged home with home health services    Anticipated LOS equal to or greater than 2 midnights due to - Age 68 and older with one or more of the following:  - Obesity  - Expected need for hospital services (PT, OT, Nursing) required for safe  discharge  - Anticipated need for postoperative skilled nursing care or inpatient rehab  - Active co-morbidities: Cardiac Arrhythmia OR   - Unanticipated findings during/Post Surgery: None  - Patient is a high risk of re-admission  due to: None

## 2020-02-15 NOTE — Anesthesia Procedure Notes (Signed)
Procedure Name: MAC Date/Time: 02/15/2020 12:28 PM Performed by: Michele Rockers, CRNA Pre-anesthesia Checklist: Patient identified, Emergency Drugs available, Suction available, Timeout performed and Patient being monitored Patient Re-evaluated:Patient Re-evaluated prior to induction Oxygen Delivery Method: Simple face mask

## 2020-02-15 NOTE — Anesthesia Procedure Notes (Signed)
Spinal  Patient location during procedure: OR Start time: 02/15/2020 12:33 PM End time: 02/15/2020 12:38 PM Staffing Anesthesiologist: Myrtie Soman, MD Preanesthetic Checklist Completed: patient identified, IV checked, site marked, risks and benefits discussed, surgical consent, monitors and equipment checked, pre-op evaluation and timeout performed Spinal Block Patient position: sitting Prep: DuraPrep Patient monitoring: heart rate, cardiac monitor, continuous pulse ox and blood pressure Approach: midline Location: L3-4 Injection technique: single-shot Needle Needle type: Sprotte  Needle gauge: 24 G Needle length: 9 cm Assessment Sensory level: T6

## 2020-02-15 NOTE — Progress Notes (Signed)
Orthopedic Tech Progress Note Patient Details:  Janann Boeve [facility] Feb 02, 1941 [id number]  CPM Right Knee Right Knee Flexion (Degrees): 80 Right Knee Extension (Degrees): 0 Additional Comments: added ice  Post Interventions Patient Tolerated: Well Instructions Provided: Care of device Ortho Devices Type of Ortho Device: Knee Immobilizer Ortho Device/Splint Interventions: Ordered   Post Interventions Patient Tolerated: Well Instructions Provided: Care of device   Janit Pagan 02/15/2020, 3:19 PM

## 2020-02-15 NOTE — Progress Notes (Signed)
ANTICOAGULATION CONSULT NOTE - Follow Up Consult  Pharmacy Consult for Warfarin Indication: atrial fibrillation  Allergies  Allergen Reactions  . Aspirin Nausea And Vomiting  . Ciprofloxacin Diarrhea  . Flagyl [Metronidazole] Diarrhea  . Penicillins Hives    20 years     Patient Measurements: Height: 5\' 3"  (160 cm) Weight: 58 kg (127 lb 13.9 oz) IBW/kg (Calculated) : 52.4  Vital Signs: Temp: 98 F (36.7 C) (07/26 1435) Temp Source: Temporal (07/26 1029) BP: 118/83 (07/26 1535) Pulse Rate: 82 (07/26 1520)  Labs: Recent Labs    02/15/20 1038  APTT 30  LABPROT 13.0  INR 1.0    Estimated Creatinine Clearance: 49.5 mL/min (by C-G formula based on SCr of 0.68 mg/dL).  Assessment: 79 year old female on warfarin prior to admission for PAF now s/p TKA.  To resume warfarin post op  Goal of Therapy:  INR 2-3 Monitor platelets by anticoagulation protocol: Yes   Plan:  Warfarin 5 mg po x 1 dose tonight  Daily INR  Thank you Anette Guarneri, PharmD  02/15/2020,4:01 PM

## 2020-02-15 NOTE — Progress Notes (Signed)
Stayed overnight and went home after opposite TKA  Dec. 21st  2020 and expect similar progress now after right TKA. Will need HHPT , Jamse Arn RN note today reviewed.

## 2020-02-15 NOTE — Op Note (Signed)
Preop diagnosis: Right knee primary osteoarthritis  Postop diagnosis: Same  Procedure: Right total knee arthroplasty.  Surgeon: Rodell Perna, MD  Assistant: Benjiman Core, PA-C medically necessary and present for the entire procedure  Anesthesia: Preoperative abductor block plus spinal anesthesia +20 cc Marcaine 20 cc Exparel at end of case.  Tourniquet: 350 time 55 minutes.  EBL 150 cc  Implants:Depuy Attune size 5 femur size 5 tibia 5 mm thick rotating platform 35 millimeters 3 peg all polypatella.  Procedure: After induction of spinal anesthesia vancomycin been given patient had UA that showed some bacteria and white blood cells with past history of E. coli 2 g Ancef was given.  Patient had questionable history of penicillin 20+ years ago unknown reaction.  Standard prepping and draping sterile skin marker Betadine Steri-Drape impervious stockinette Coban were all applied.  Timeout procedure completed leg was wrapped in Esmarch tourniquet inflated.  Midline incision was made medial parapatellar incision patella was everted 10 mm resected off the patella.  Intramedullary hole made in the femur patient had significant valgus of 15 degrees with erosion lateral compartment.  10 mm were taken off of the distal femur 10 off the proximal tibia and an additional 2 mm taken off the tibia.  Meniscal remnants were resected there was no lateral meniscus left.  Sizing for the femur set on 5 valgus was a size 5.  Chamfer cuts box cuts were made keel preparation of the tibia trials were inserted.  The notch had to be deep and slightly for the next cut in order to lift the femur sit all the way down particularly on the medial aspect and there was some posterior medial bone had to be resected 1 small piece of broken loose and was removed.  Pulse lavage back to mixing of the cement cementing the tibia followed by femur placement of permanent rotating poly which gave full extension good flexion extension lateral  balance and patella was held with a self retaining patellar clamp all excessive cement was removed.  Exparel Marcaine was infiltrated while cement was setting up and tourniquet was deflated 50 minutes when the cement was hard.  Hemostasis obtained TXA sponge was used at that time.  Standard closure #1 coated Vicryl 2-0 Vicryl subtendinous tissue 0 Vicryl and the skin staple closure postop dressing and knee immobilizer.  Instrument count needle count was correct.

## 2020-02-15 NOTE — Anesthesia Postprocedure Evaluation (Signed)
Anesthesia Post Note  Patient: Pam [name]  Procedure(s) Performed: RIGHT TOTAL KNEE ARTHROPLASTY (Right Knee)     Patient location during evaluation: PACU Anesthesia Type: Spinal Level of consciousness: oriented and awake and alert Pain management: pain level controlled Vital Signs Assessment: post-procedure vital signs reviewed and stable Respiratory status: spontaneous breathing, respiratory function stable and patient connected to nasal cannula oxygen Cardiovascular status: blood pressure returned to baseline and stable Postop Assessment: no headache, no backache and no apparent nausea or vomiting Anesthetic complications: no   No complications documented.  Last Vitals:  Vitals:   02/15/20 1520 02/15/20 1535  BP: (!) 131/61 (P) 118/83  Pulse: 82   Resp: 14   Temp:    SpO2: 91%     Last Pain:  Vitals:   02/15/20 1505  TempSrc:   PainSc: 6                  Junior Kenedy COKER

## 2020-02-15 NOTE — Anesthesia Procedure Notes (Signed)
Anesthesia Regional Block: Adductor canal block   Pre-Anesthetic Checklist: ,, timeout performed, Correct Patient, Correct Site, Correct Laterality, Correct Procedure, Correct Position, site marked, Risks and benefits discussed,  Surgical consent,  Pre-op evaluation,  At surgeon's request and post-op pain management  Laterality: Right  Prep: chloraprep       Needles:  Injection technique: Single-shot  Needle Type: Echogenic Needle     Needle Length: 9cm      Additional Needles:   Procedures:,,,, ultrasound used (permanent image in chart),,,,  Narrative:  Start time: 02/15/2020 12:06 PM End time: 02/15/2020 12:11 PM Injection made incrementally with aspirations every 5 mL.  Performed by: Personally  Anesthesiologist: Myrtie Soman, MD  Additional Notes: Patient tolerated the procedure well without complications

## 2020-02-15 NOTE — Evaluation (Signed)
Physical Therapy Evaluation Patient Details Name: Pam [name] MRN: [id number] DOB: Aug 20, 1940 Today's Date: 02/15/2020   History of Present Illness  Patient is a 79 y/o female admitted for R TKA.  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  Clinical Impression  Patient presents with decreased mobility due to pain in R knee and decreased AROM post R TKA.  She mobilized well up to chair this pm and plans on home tomorrow.  She will benefit from skilled PT during acute stay to maximize mobility/safety/ensure able to access home and for HEP instruction.  Plans for follow up set up already per MD.     Follow Up Recommendations Follow surgeon's recommendation for DC plan and follow-up therapies (reports HHPT set up already)    Equipment Recommendations  None recommended by PT    Recommendations for Other Services       Precautions / Restrictions Precautions Precautions: Fall;Knee Restrictions Weight Bearing Restrictions: Yes RLE Weight Bearing: Weight bearing as tolerated      Mobility  Bed Mobility Overal bed mobility: Needs Assistance Bed Mobility: Supine to Sit     Supine to sit: HOB elevated;Supervision     General bed mobility comments: increased time with use of UE's  Transfers Overall transfer level: Needs assistance Equipment used: Rolling walker (2 wheeled) Transfers: Sit to/from Stand Sit to Stand: Min guard         General transfer comment: up from EOB with assist for balance with transition from hands on bed to walker  Ambulation/Gait Ambulation/Gait assistance: Min guard Gait Distance (Feet): 3 Feet Assistive device: Rolling walker (2 wheeled) Gait Pattern/deviations: Step-to pattern;Decreased stride length     General Gait Details: cues for sequencing, assist from bed to recliner in room with RW  Stairs            Wheelchair Mobility    Modified Rankin (Stroke Patients Only)        Balance Overall balance assessment: Needs assistance   Sitting balance-Leahy Scale: Good     Standing balance support: Bilateral upper extremity supported Standing balance-Leahy Scale: Poor Standing balance comment: UE support in standing                             Pertinent Vitals/Pain Pain Assessment: Faces Faces Pain Scale: Hurts little more Pain Location: R knee Pain Descriptors / Indicators: Guarding Pain Intervention(s): Monitored during session;Repositioned;Ice applied    Home Living Family/patient expects to be discharged to:: Private residence Living Arrangements: Spouse/significant other;Children Available Help at Discharge: Family Type of Home: House Home Access: Stairs to enter Entrance Stairs-Rails: Can reach both Entrance Stairs-Number of Steps: 4 Home Layout: One level Home Equipment: Environmental consultant - 2 wheels;Bedside commode;Cane - single point      Prior Function Level of Independence: Independent with assistive device(s)         Comments: uses walker at times     Hand Dominance   Dominant Hand: Right    Extremity/Trunk Assessment   Upper Extremity Assessment Upper Extremity Assessment: Overall WFL for tasks assessed    Lower Extremity Assessment Lower Extremity Assessment: RLE deficits/detail RLE Deficits / Details: in CPM moving up to 90 degrees, knee flat on bed extension limited about 15 degrees, worked on Lobbyist and extension       Communication   Communication: No difficulties  Cognition Arousal/Alertness: Awake/alert Behavior During Therapy: WFL for tasks assessed/performed Overall Cognitive  Status: Within Functional Limits for tasks assessed                                        General Comments General comments (skin integrity, edema, etc.): daughter in the room feeding pt, encouraged up to chair for self feeding; RN informed to assist pt after at least an hour in bed, to bathroom as she hopes to  self cath before back to bed    Exercises Total Joint Exercises Ankle Circles/Pumps: AROM;5 reps;Seated Quad Sets: AROM;Right;5 reps;Supine   Assessment/Plan    PT Assessment Patient needs continued PT services  PT Problem List Decreased strength;Decreased mobility;Decreased activity tolerance;Decreased range of motion;Decreased balance;Decreased knowledge of use of DME;Pain       PT Treatment Interventions DME instruction;Therapeutic activities;Gait training;Therapeutic exercise;Stair training;Functional mobility training;Patient/family education    PT Goals (Current goals can be found in the Care Plan section)  Acute Rehab PT Goals Patient Stated Goal: to go home tomorrow PT Goal Formulation: With patient/family Time For Goal Achievement: 02/19/20 Potential to Achieve Goals: Good    Frequency 7X/week   Barriers to discharge        Co-evaluation               AM-PAC PT "6 Clicks" Mobility  Outcome Measure Help needed turning from your back to your side while in a flat bed without using bedrails?: None Help needed moving from lying on your back to sitting on the side of a flat bed without using bedrails?: None Help needed moving to and from a bed to a chair (including a wheelchair)?: A Little Help needed standing up from a chair using your arms (e.g., wheelchair or bedside chair)?: A Little Help needed to walk in hospital room?: A Little Help needed climbing 3-5 steps with a railing? : A Lot 6 Click Score: 19    End of Session Equipment Utilized During Treatment: Right knee immobilizer Activity Tolerance: Patient tolerated treatment well Patient left: in chair;with call bell/phone within reach;with family/visitor present   PT Visit Diagnosis: Difficulty in walking, not elsewhere classified (R26.2);Pain Pain - Right/Left: Right Pain - part of body: Knee    Time: 2241-1464 PT Time Calculation (min) (ACUTE ONLY): 23 min   Charges:   PT Evaluation $PT Eval  Low Complexity: 1 Low PT Treatments $Therapeutic Activity: 8-22 mins        Magda Kiel, PT Acute Rehabilitation Services [id number]:[phone] [phone] 02/15/2020   Reginia Naas 02/15/2020, 5:59 PM

## 2020-02-15 NOTE — Interval H&P Note (Signed)
History and Physical Interval Note:  02/15/2020 12:04 PM  [name]  has presented today for surgery, with the diagnosis of right knee osteoarthritis.  The various methods of treatment have been discussed with the patient and family. After consideration of risks, benefits and other options for treatment, the patient has consented to  Procedure(s): RIGHT TOTAL KNEE ARTHROPLASTY (Right) as a surgical intervention.  The patient's history has been reviewed, patient examined, no change in status, stable for surgery.  I have reviewed the patient's chart and labs.  Questions were answered to the patient's satisfaction.     Pam [name]

## 2020-02-15 NOTE — Care Plan (Signed)
Ortho Bundle Case Management Note  Patient Details  Name: Pam [name] MRN: [id number] Date of Birth: [birth year]  [facility] Pre-op call to patient to discuss her upcoming R-TKA with Dr. [name] on 02/15/20. Patient seen in office yesterday (02/03/20) while present for her H&P appointment with Dr. [name]' PA. Patient is an Ortho bundle through [facility] and is agreeable to case management. Reviewed all post-op care instructions with copy of written instructions provided as well as contact for CM. She has family that will assist after discharge. Anticipate HHPT after short hospital stay. Choice provided and referral to [facility]. Also anticipate OPPT will be needed-she has requested I set her up with [facility] PT as she did with her previous knee replacement. No DME needed. Will continue to follow.                         DME Arranged:   (Patient has all DME needed post-surgery; previous Left total knee replacement less than a year ago) DME Agency:     HH Arranged:  PT [place]:  [facility] (now [facility])  Additional Comments: Please contact me with any questions of if this plan should need to change.  Jamse Arn, RN, BSN, [employer]  [phone] 02/15/2020, 2:24 PM

## 2020-02-16 ENCOUNTER — Telehealth: Payer: Self-pay

## 2020-02-16 ENCOUNTER — Encounter (HOSPITAL_COMMUNITY): Payer: Self-pay | Admitting: Orthopaedic Surgery

## 2020-02-16 ENCOUNTER — Other Ambulatory Visit: Payer: Self-pay | Admitting: Surgery

## 2020-02-16 ENCOUNTER — Telehealth: Payer: Self-pay | Admitting: Orthopaedic Surgery

## 2020-02-16 DIAGNOSIS — Z8541 Personal history of malignant neoplasm of cervix uteri: Secondary | ICD-10-CM | POA: Diagnosis not present

## 2020-02-16 DIAGNOSIS — Z87891 Personal history of nicotine dependence: Secondary | ICD-10-CM | POA: Diagnosis not present

## 2020-02-16 DIAGNOSIS — I4819 Other persistent atrial fibrillation: Secondary | ICD-10-CM | POA: Diagnosis not present

## 2020-02-16 DIAGNOSIS — Z79899 Other long term (current) drug therapy: Secondary | ICD-10-CM | POA: Diagnosis not present

## 2020-02-16 DIAGNOSIS — M1711 Unilateral primary osteoarthritis, right knee: Secondary | ICD-10-CM | POA: Diagnosis not present

## 2020-02-16 DIAGNOSIS — Z853 Personal history of malignant neoplasm of breast: Secondary | ICD-10-CM | POA: Diagnosis not present

## 2020-02-16 LAB — CBC
HCT: 32.6 % — ABNORMAL LOW (ref 36.0–46.0)
Hemoglobin: 10.5 g/dL — ABNORMAL LOW (ref 12.0–15.0)
MCH: 28.4 pg (ref 26.0–34.0)
MCHC: 32.2 g/dL (ref 30.0–36.0)
MCV: 88.1 fL (ref 80.0–100.0)
Platelets: 279 10*3/uL (ref 150–400)
RBC: 3.7 MIL/uL — ABNORMAL LOW (ref 3.87–5.11)
RDW: 13.7 % (ref 11.5–15.5)
WBC: 8.7 10*3/uL (ref 4.0–10.5)
nRBC: 0 % (ref 0.0–0.2)

## 2020-02-16 LAB — BASIC METABOLIC PANEL
Anion gap: 8 (ref 5–15)
BUN: 8 mg/dL (ref 8–23)
CO2: 24 mmol/L (ref 22–32)
Calcium: 8.2 mg/dL — ABNORMAL LOW (ref 8.9–10.3)
Chloride: 104 mmol/L (ref 98–111)
Creatinine, Ser: 0.89 mg/dL (ref 0.44–1.00)
GFR calc Af Amer: >60 mL/min (ref >60)
GFR calc non Af Amer: >60 mL/min (ref >60)
Glucose, Bld: 181 mg/dL — ABNORMAL HIGH (ref 70–99)
Potassium: 4.3 mmol/L (ref 3.5–5.1)
Sodium: 136 mmol/L (ref 135–145)

## 2020-02-16 LAB — URINE CULTURE: Culture: NO GROWTH

## 2020-02-16 LAB — PROTIME-INR
INR: 1.1 (ref 0.8–1.2)
Prothrombin Time: 14 seconds (ref 11.4–15.2)

## 2020-02-16 MED ORDER — OXYCODONE-ACETAMINOPHEN 5-325 MG PO TABS: 1.0000 | ORAL_TABLET | Freq: Four times a day (QID) | ORAL | 0 refills | Status: DC | PRN

## 2020-02-16 MED ORDER — OXYCODONE-ACETAMINOPHEN 5-325 MG PO TABS: 1.0000 | ORAL_TABLET | ORAL | 0 refills | Status: DC | PRN

## 2020-02-16 MED ORDER — METHOCARBAMOL 500 MG PO TABS: 500.0000 mg | ORAL_TABLET | Freq: Three times a day (TID) | ORAL | 0 refills | Status: DC | PRN

## 2020-02-16 NOTE — Discharge Instructions (Signed)

## 2020-02-16 NOTE — Progress Notes (Signed)
   Subjective: 1 Day Post-Op Procedure(s) (LRB): RIGHT TOTAL KNEE ARTHROPLASTY (Right) Patient reports pain as mild and moderate.    Objective: Vital signs in last 24 hours: Temp:  [97.6 F (36.4 C)-98.4 F (36.9 C)] 98.4 F (36.9 C) (07/26 2056) Pulse Rate:  [62-82] 63 (07/26 2056) Resp:  [14-22] 18 (07/26 2056) BP: (109-139)/(60-83) 124/69 (07/26 2056) SpO2:  [90 %-100 %] 97 % (07/26 2056) Weight:  [58 kg] 58 kg (07/26 1029)  Intake/Output from previous day: 07/26 0701 - 07/27 0700 In: 800 [I.V.:700; IV Piggyback:100] Out: 1300 [Urine:1200; Blood:100] Intake/Output this shift: No intake/output data recorded.  Recent Labs    02/15/20 1628  HGB 12.0   Recent Labs    02/15/20 1628  WBC 10.1  RBC 4.32  HCT 37.8  PLT 259   Recent Labs    02/15/20 1628 02/16/20 0336  NA  --  136  K  --  4.3  CL  --  104  CO2  --  24  BUN  --  8  CREATININE 0.81 0.89  GLUCOSE  --  181*  CALCIUM  --  8.2*   Recent Labs    02/15/20 1038 02/16/20 0336  INR 1.0 1.1    Neurologically intact CPM to 90 degrees DG Chest 2 View  Result Date: 02/15/2020 CLINICAL DATA:  Preoperative assessment for total knee replacement. History of breast carcinoma EXAM: CHEST - 2 VIEW COMPARISON:  Dec 14, 2019 FINDINGS: Lungs are clear. Heart size and pulmonary vascularity are normal. No adenopathy. There is aortic atherosclerosis. Patient is status post mastectomy on the left with surgical clips in left axilla. Old rib trauma on the left. There is calcification in the right carotid artery. IMPRESSION: Lungs clear.  Heart size normal.  Status post left mastectomy. Aortic Atherosclerosis (ICD10-I70.0). Right carotid artery calcification also noted. Electronically Signed   By: Lowella Grip III M.D.   On: 02/15/2020 11:00   DG Knee 1-2 Views Right  Result Date: 02/15/2020 CLINICAL DATA:  Postop. EXAM: RIGHT KNEE - 1-2 VIEW COMPARISON:  None. FINDINGS: Right knee arthroplasty in expected alignment.  No periprosthetic lucency or fracture. There has been patellar resurfacing. Recent postsurgical change includes air and edema in the joint and soft tissues is well as anterior skin staples. Suspected prior MCL injury with spurring from the distal medial femoral condyle metaphysis. Overlying dressing in place. IMPRESSION: Right knee arthroplasty without immediate postoperative complication. Electronically Signed   By: Keith Rake M.D.   On: 02/15/2020 15:26    Assessment/Plan: 1 Day Post-Op Procedure(s) (LRB): RIGHT TOTAL KNEE ARTHROPLASTY (Right) Up with therapy, last December went home after one night stay , wants to do therapy today and go home. Rx sent in. Dressing change before discharge.   Pam [name] 02/16/2020, 7:37 AM

## 2020-02-16 NOTE — Telephone Encounter (Signed)
I think you had Pam [name] this. If not send back to me.

## 2020-02-16 NOTE — Telephone Encounter (Signed)
James sent medication to pharmacy.

## 2020-02-16 NOTE — Telephone Encounter (Signed)
Can you please send meds to [facility] in [place]?

## 2020-02-16 NOTE — Progress Notes (Signed)
Pt is being discharged. Family at bedside and will transport her home. Pt is AOx4, room air O2 100%. All other VSS. The patient received AVS and has no complaints, or questions at this time. All IVs and other equipment removed from patient.

## 2020-02-16 NOTE — Care Management Obs Status (Signed)
[place] NOTIFICATION   Patient Details  Name: KASANDRA FEHR MRN: [id number] Date of Birth: 05-27-1941   Medicare Observation Status Notification Given:  Yes    Curlene Labrum, RN 02/16/2020, 9:16 AM

## 2020-02-16 NOTE — Progress Notes (Signed)
Physical Therapy Treatment Patient Details Name: Pam [name] MRN: [id number] DOB: Oct 18, 1940 Today's Date: 02/16/2020    History of Present Illness Patient is a 79 y/o female admitted for R TKA.  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    PT Comments    Pt supine in bed on arrival.  She is motivated to progress and improve this session.  Pt focused on stair training and progressing gt.  She is very tight in her heel cord and hamstrings on R LE.  Pt encouraged to rest in supported extension to avoid knee contracture.  Issued HEP and informed RN she is ready to d/c from a mobility stand point.     Follow Up Recommendations  Follow surgeon's recommendation for DC plan and follow-up therapies     Equipment Recommendations  None recommended by PT    Recommendations for Other Services       Precautions / Restrictions Precautions Precautions: Fall;Knee Restrictions Weight Bearing Restrictions: No RLE Weight Bearing: Weight bearing as tolerated    Mobility  Bed Mobility Overal bed mobility: Needs Assistance Bed Mobility: Supine to Sit     Supine to sit: HOB elevated;Min assist     General bed mobility comments: Min assistance to advance RLE to edge of bed.  Transfers Overall transfer level: Needs assistance Equipment used: Rolling walker (2 wheeled) Transfers: Sit to/from Stand Sit to Stand: Min guard         General transfer comment: Cues for hand placement to and from seated surface.  Increased time and effort to rise into standing with cues for hip, trunk and cervical extension.  Ambulation/Gait Ambulation/Gait assistance: Min guard Gait Distance (Feet): 175 Feet Assistive device: Rolling walker (2 wheeled) Gait Pattern/deviations: Step-to pattern;Decreased stride length;Trunk flexed;Decreased stance time - right;Decreased dorsiflexion - right     General Gait Details: Cues for sequencing and upper trunk  control.  Pt with decreased dorsiflexion on R with cues for heel strike and knee extension in stance phase.  Pt able to progress and advance mobility well this session.   Stairs Stairs: Yes Stairs assistance: Min guard Stair Management: Step to pattern Number of Stairs: 2 General stair comments: Cues for sequencing and use of B rails.  Pt very painful negotiating stairs.  Slow and guarded but able to complete with min guard assistance.   Wheelchair Mobility    Modified Rankin (Stroke Patients Only)       Balance Overall balance assessment: Needs assistance   Sitting balance-Leahy Scale: Good       Standing balance-Leahy Scale: Poor                              Cognition Arousal/Alertness: Awake/alert Behavior During Therapy: WFL for tasks assessed/performed Overall Cognitive Status: Within Functional Limits for tasks assessed                                        Exercises Total Joint Exercises Ankle Circles/Pumps: AROM;Both;10 reps;Supine Quad Sets: AROM;Right;10 reps;Supine Heel Slides: AAROM;Right;10 reps;Supine Hip ABduction/ADduction: AAROM;Right;10 reps;Supine Straight Leg Raises: AAROM;Right;10 reps;Supine Goniometric ROM: 13-62 R knee.    General Comments        Pertinent Vitals/Pain Pain Assessment: Faces Faces Pain Scale: Hurts little more Pain Location: R knee Pain Descriptors / Indicators: Guarding Pain Intervention(s): Monitored  during session;Repositioned    Home Living                      Prior Function            PT Goals (current goals can now be found in the care plan section) Acute Rehab PT Goals Patient Stated Goal: to go home tomorrow Potential to Achieve Goals: Good Progress towards PT goals: Progressing toward goals    Frequency    7X/week      PT Plan Current plan remains appropriate    Co-evaluation              AM-PAC PT "6 Clicks" Mobility   Outcome Measure  Help  needed turning from your back to your side while in a flat bed without using bedrails?: None Help needed moving from lying on your back to sitting on the side of a flat bed without using bedrails?: None Help needed moving to and from a bed to a chair (including a wheelchair)?: A Little Help needed standing up from a chair using your arms (e.g., wheelchair or bedside chair)?: A Little Help needed to walk in hospital room?: A Little Help needed climbing 3-5 steps with a railing? : A Lot 6 Click Score: 19    End of Session Equipment Utilized During Treatment: Right knee immobilizer;Gait belt Activity Tolerance: Patient tolerated treatment well Patient left: in chair;with call bell/phone within reach;with family/visitor present   PT Visit Diagnosis: Difficulty in walking, not elsewhere classified (R26.2);Pain Pain - Right/Left: Right Pain - part of body: Knee     Time: 1020-1057 PT Time Calculation (min) (ACUTE ONLY): 37 min  Charges:  $Gait Training: 8-22 mins $Therapeutic Exercise: 8-22 mins                     Erasmo Leventhal , PTA Acute Rehabilitation Services Pager [phone] Office [phone]     Benelli Winther Eli Hose 02/16/2020, 11:48 AM

## 2020-02-16 NOTE — Plan of Care (Signed)

## 2020-02-16 NOTE — Telephone Encounter (Signed)
Could you please send medications to [facility] in [place]?   Thanks.

## 2020-02-16 NOTE — Telephone Encounter (Signed)
Patient daughter called in saying they went to pick up medication in [place] says they do not have that medication until 2 weeks . Patient would like to have medications sent to [facility] in [place]

## 2020-02-16 NOTE — TOC Initial Note (Signed)
Transition of Care [facility]) - Initial/Assessment Note    Patient Details  Name: Pam [name] MRN: [id number] Date of Birth: 08-Feb-1941  Transition of Care [facility]) CM/SW Contact:    Curlene Labrum, RN Phone Number: 02/16/2020, 9:27 AM  Clinical Narrative:                 Case management met with patient at bedside, S/P Right total knee arthroplasty by Dr. [name].  Patient previously set up with dme and home health services for Pt - [facility].  Patient given [facility] letter for observation notice and patient verbalizes understanding.  Patient will discharge home today via car with daughter.  Expected Discharge Plan: [place] Barriers to Discharge: No Barriers Identified   Patient Goals and CMS Choice Patient states their goals for this hospitalization and ongoing recovery are:: To return home with home health. CMS Medicare.gov Compare Post Acute Care list provided to:: Patient Choice offered to / list presented to : Patient  Expected Discharge Plan and Services Expected Discharge Plan: [place]   Discharge Planning Services: CM Consult Post Acute Care Choice: [place] arrangements for the past 2 months: Single Family Home Expected Discharge Date: 02/16/20               DME Arranged:  (Patient has all DME needed post-surgery; previous Left total knee replacement less than a year ago)         HH Arranged: PT [place]: [facility] (now [facility]) Date [place]: 02/16/20 Time [place]: [phone] Representative spoke with at [place]: [place] - cornfirmed with email list for [facility] [place] Arrangements/Services Living arrangements for the past 2 months: [place] with:: Spouse Patient language and need for interpreter reviewed:: Yes Do you feel safe going back to the place where you live?: Yes      Need for Family Participation in Patient Care: Yes  (Comment) Care giver support system in place?: Yes (comment) Current home services: DME (currently has 3:1, RW, Cane, shower hose) Criminal Activity/Legal Involvement Pertinent to Current Situation/Hospitalization: No - Comment as needed  Activities of Daily Living Home Assistive Devices/Equipment: Environmental consultant (specify type), Cane (specify quad or straight), Raised toilet seat with rails ADL Screening (condition at time of admission) Patient's cognitive ability adequate to safely complete daily activities?: Yes Is the patient deaf or have difficulty hearing?: No Does the patient have difficulty seeing, even when wearing glasses/contacts?: No Does the patient have difficulty concentrating, remembering, or making decisions?: No Patient able to express need for assistance with ADLs?: Yes Does the patient have difficulty dressing or bathing?: No Independently performs ADLs?: Yes (appropriate for developmental age) Does the patient have difficulty walking or climbing stairs?: Yes Weakness of Legs: Right Weakness of Arms/Hands: None  Permission Sought/Granted Permission sought to share information with : Case Manager Permission granted to share information with : Yes, Verbal Permission Granted     Permission granted to share info w AGENCY: [place] granted to share info w Relationship: spouse, daughter     Emotional Assessment Appearance:: Appears stated age Attitude/Demeanor/Rapport: Gracious Affect (typically observed): Accepting Orientation: : Oriented to Self, Oriented to Place, Oriented to  Time, Oriented to Situation Alcohol / Substance Use: Not Applicable Psych Involvement: No (comment)  Admission diagnosis:  Arthritis of right knee [M17.11] Patient Active Problem List   Diagnosis Date Noted  . Arthritis of  right knee 02/15/2020  . Diarrhea 12/30/2019  . Acute abdominal pain 12/14/2019  . Acute lower UTI 12/14/2019  . Colitis 12/14/2019  . Unilateral primary  osteoarthritis, right knee 08/27/2019  . S/P total knee arthroplasty, 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  . Educated about COVID-19 virus infection 07/27/2019  . Persistent atrial fibrillation ([place]) 06/07/2019  . PAF (paroxysmal atrial fibrillation) ([place])   . Candidiasis, vagina 01/02/2019  . Hernia of abdominal cavity 10/07/2015  . Overflow incontinence of urine 07/04/2015  . Urinary retention with incomplete bladder emptying 05/09/2015  . Cervical cancer ([place]) 03/23/2015   PCP:  Neale Burly, MD Pharmacy:   [facility] [address] [place] [address] [place] [address] Phone: [phone] Fax: [phone]     Social Determinants of Health (SDOH) Interventions    Readmission Risk Interventions No flowsheet data found.

## 2020-02-16 NOTE — Telephone Encounter (Signed)
Patient's daughter Abigail Butts called stating she called the pharmacy in [place] and the pain medicine has not been called in yet. Abigail Butts said her mother is home now and is in a lot of pain. Abigail Butts asked if she can be called when Rx is called into the pharmacy. Please see previous note. The number to contact Abigail Butts is [phone]

## 2020-02-17 ENCOUNTER — Telehealth: Payer: Self-pay | Admitting: *Deleted

## 2020-02-17 NOTE — Telephone Encounter (Signed)
Ortho bundle D/C call completed. 

## 2020-02-18 DIAGNOSIS — Z853 Personal history of malignant neoplasm of breast: Secondary | ICD-10-CM | POA: Diagnosis not present

## 2020-02-18 DIAGNOSIS — K469 Unspecified abdominal hernia without obstruction or gangrene: Secondary | ICD-10-CM | POA: Diagnosis not present

## 2020-02-18 DIAGNOSIS — Z87891 Personal history of nicotine dependence: Secondary | ICD-10-CM | POA: Diagnosis not present

## 2020-02-18 DIAGNOSIS — N39490 Overflow incontinence: Secondary | ICD-10-CM | POA: Diagnosis not present

## 2020-02-18 DIAGNOSIS — Z9012 Acquired absence of left breast and nipple: Secondary | ICD-10-CM | POA: Diagnosis not present

## 2020-02-18 DIAGNOSIS — Z7901 Long term (current) use of anticoagulants: Secondary | ICD-10-CM | POA: Diagnosis not present

## 2020-02-18 DIAGNOSIS — I480 Paroxysmal atrial fibrillation: Secondary | ICD-10-CM | POA: Diagnosis not present

## 2020-02-18 DIAGNOSIS — R338 Other retention of urine: Secondary | ICD-10-CM | POA: Diagnosis not present

## 2020-02-18 DIAGNOSIS — Z87440 Personal history of urinary (tract) infections: Secondary | ICD-10-CM | POA: Diagnosis not present

## 2020-02-18 DIAGNOSIS — Z471 Aftercare following joint replacement surgery: Secondary | ICD-10-CM | POA: Diagnosis not present

## 2020-02-18 DIAGNOSIS — Z8541 Personal history of malignant neoplasm of cervix uteri: Secondary | ICD-10-CM | POA: Diagnosis not present

## 2020-02-18 DIAGNOSIS — M109 Gout, unspecified: Secondary | ICD-10-CM | POA: Diagnosis not present

## 2020-02-18 DIAGNOSIS — Z96653 Presence of artificial knee joint, bilateral: Secondary | ICD-10-CM | POA: Diagnosis not present

## 2020-02-18 DIAGNOSIS — E785 Hyperlipidemia, unspecified: Secondary | ICD-10-CM | POA: Diagnosis not present

## 2020-02-20 DIAGNOSIS — M109 Gout, unspecified: Secondary | ICD-10-CM | POA: Diagnosis not present

## 2020-02-20 DIAGNOSIS — Z471 Aftercare following joint replacement surgery: Secondary | ICD-10-CM | POA: Diagnosis not present

## 2020-02-20 DIAGNOSIS — N39490 Overflow incontinence: Secondary | ICD-10-CM | POA: Diagnosis not present

## 2020-02-20 DIAGNOSIS — E785 Hyperlipidemia, unspecified: Secondary | ICD-10-CM | POA: Diagnosis not present

## 2020-02-20 DIAGNOSIS — R338 Other retention of urine: Secondary | ICD-10-CM | POA: Diagnosis not present

## 2020-02-20 DIAGNOSIS — I480 Paroxysmal atrial fibrillation: Secondary | ICD-10-CM | POA: Diagnosis not present

## 2020-02-22 ENCOUNTER — Telehealth: Payer: Self-pay | Admitting: *Deleted

## 2020-02-22 DIAGNOSIS — M109 Gout, unspecified: Secondary | ICD-10-CM | POA: Diagnosis not present

## 2020-02-22 DIAGNOSIS — E785 Hyperlipidemia, unspecified: Secondary | ICD-10-CM | POA: Diagnosis not present

## 2020-02-22 DIAGNOSIS — R338 Other retention of urine: Secondary | ICD-10-CM | POA: Diagnosis not present

## 2020-02-22 DIAGNOSIS — N39490 Overflow incontinence: Secondary | ICD-10-CM | POA: Diagnosis not present

## 2020-02-22 DIAGNOSIS — Z471 Aftercare following joint replacement surgery: Secondary | ICD-10-CM | POA: Diagnosis not present

## 2020-02-22 DIAGNOSIS — I480 Paroxysmal atrial fibrillation: Secondary | ICD-10-CM | POA: Diagnosis not present

## 2020-02-22 NOTE — Telephone Encounter (Signed)
Ortho bundle 7 day call completed. 

## 2020-02-22 NOTE — Discharge Summary (Signed)
Patient ID: Pam [name] MRN: [id number] DOB/AGE: [birth year] 79 y.o.  Admit date: 02/15/2020 Discharge date: February 16, 2020 Admission Diagnoses:  Active Problems:   Arthritis of right knee   Discharge Diagnoses:  Active Problems:   Arthritis of right knee  status post Procedure(s): RIGHT TOTAL KNEE ARTHROPLASTY  Past Medical History:  Diagnosis Date   Arthritis    Atrial fibrillation ([place])    Cancer [facility])    BREAST CANCER / CERVICAL CANCER    Cervical cancer ([place])    Colitis    Dysrhythmia    History of breast cancer 2006   Dr. Sonny Dandy oncologist - left breast   History of gout    Hyperlipidemia    Self-catheterizes urinary bladder    Urinary retention Sept 2016   s/p hysterectomy    Surgeries: Procedure(s): RIGHT TOTAL KNEE ARTHROPLASTY on 02/15/2020   Consultants:   Discharged Condition: Improved  Hospital Course: Pam [name] is an 79 y.o. female who was admitted 02/15/2020 for operative treatment of right knee djd. Patient failed conservative treatments (please see the history and physical for the specifics) and had severe unremitting pain that affects sleep, daily activities and work/hobbies. After pre-op clearance, the patient was taken to the operating room on 02/15/2020 and underwent  Procedure(s): RIGHT TOTAL KNEE ARTHROPLASTY.    Patient was given perioperative antibiotics:  Anti-infectives (From admission, onward)   Start     Dose/Rate Route Frequency Ordered Stop   02/15/20 1600  vancomycin (VANCOCIN) IVPB 1000 mg/200 mL premix        1,000 mg 200 mL/hr over 60 Minutes Intravenous Every 12 hours 02/15/20 1550 02/16/20 0847   02/15/20 1045  vancomycin (VANCOCIN) IVPB 1000 mg/200 mL premix        1,000 mg 200 mL/hr over 60 Minutes Intravenous On call to O.R. 02/15/20 1037 02/15/20 1605       Patient was given sequential compression devices and early ambulation to prevent DVT.   Patient benefited maximally from hospital stay and there  were no complications. At the time of discharge, the patient was urinating/moving their bowels without difficulty, tolerating a regular diet, pain is controlled with oral pain medications and they have been cleared by PT/OT.   Recent vital signs: No data found.   Recent laboratory studies: No results for input(s): WBC, HGB, HCT, PLT, NA, K, CL, CO2, BUN, CREATININE, GLUCOSE, INR, CALCIUM in the last 72 hours.  Invalid input(s): PT, 2   Discharge Medications:   Allergies as of 02/16/2020      Reactions   Aspirin Nausea And Vomiting   Ciprofloxacin Diarrhea   Flagyl [metronidazole] Diarrhea   Penicillins Hives   20 years      Medication List    STOP taking these medications   traMADol 50 MG tablet Commonly known as: ULTRAM     TAKE these medications   Align 4 MG Caps Take 4 mg by mouth daily.   bisacodyl 5 MG EC tablet Commonly known as: DULCOLAX Take 5 mg by mouth daily as needed for mild constipation.   diltiazem 360 MG 24 hr capsule Commonly known as: CARDIZEM CD Take 1 capsule (360 mg total) by mouth daily.   fexofenadine 180 MG tablet Commonly known as: ALLEGRA Take 180 mg by mouth daily.   gabapentin 100 MG capsule Commonly known as: NEURONTIN Take 100 mg by mouth at bedtime.   methocarbamol 500 MG tablet Commonly known as: Robaxin Take 1 tablet (500 mg total) by mouth every  8 (eight) hours as needed for muscle spasms.   metoprolol tartrate 25 MG tablet Commonly known as: LOPRESSOR Take 1 tablet (25 mg total) by mouth 2 (two) times daily.   oxyCODONE-acetaminophen 5-325 MG tablet Commonly known as: Percocet Take 1-2 tablets by mouth every 6 (six) hours as needed for severe pain.   rOPINIRole 1 MG tablet Commonly known as: REQUIP Take 1 mg by mouth at bedtime.   sulfamethoxazole-trimethoprim 800-160 MG tablet Commonly known as: BACTRIM DS Take 1 tablet by mouth 2 (two) times daily.   warfarin 5 MG tablet Commonly known as: COUMADIN Take 5 mg by  mouth one time only at 4 PM.       Diagnostic Studies: DG Chest 2 View  Result Date: 02/15/2020 CLINICAL DATA:  Preoperative assessment for total knee replacement. History of breast carcinoma EXAM: CHEST - 2 VIEW COMPARISON:  Dec 14, 2019 FINDINGS: Lungs are clear. Heart size and pulmonary vascularity are normal. No adenopathy. There is aortic atherosclerosis. Patient is status post mastectomy on the left with surgical clips in left axilla. Old rib trauma on the left. There is calcification in the right carotid artery. IMPRESSION: Lungs clear.  Heart size normal.  Status post left mastectomy. Aortic Atherosclerosis (ICD10-I70.0). Right carotid artery calcification also noted. Electronically Signed   By: Lowella Grip III M.D.   On: 02/15/2020 11:00   DG Knee 1-2 Views Right  Result Date: 02/15/2020 CLINICAL DATA:  Postop. EXAM: RIGHT KNEE - 1-2 VIEW COMPARISON:  None. FINDINGS: Right knee arthroplasty in expected alignment. No periprosthetic lucency or fracture. There has been patellar resurfacing. Recent postsurgical change includes air and edema in the joint and soft tissues is well as anterior skin staples. Suspected prior MCL injury with spurring from the distal medial femoral condyle metaphysis. Overlying dressing in place. IMPRESSION: Right knee arthroplasty without immediate postoperative complication. Electronically Signed   By: Keith Rake M.D.   On: 02/15/2020 15:26       Follow-up Information    Pam Killings, MD. Go on 03/03/2020.   Specialty: Orthopedic Surgery Why: at 10:00 am at the [facility] office location and not the [facility] location for your 2 week post-op. Contact information: [place] [address] [phone]        Home, [facility] Follow up.   Specialty: Home Health Services Why: Someone from the home health agency will be in contact with you after discharge home to arrange your first in home physical therapy appointment. Contact  information: [address] [place] [address] [place] on 03/03/2020.   Specialty: Physical Therapy Why: at 11:00 am immediately after you see Dr. [name] for your 2 week post-op appointment. Contact information: [facility] [place] [address] [phone]               Discharge Plan:  discharge to home  Disposition:     Signed: Benjiman Core  02/22/2020, 3:27 PM

## 2020-02-25 ENCOUNTER — Telehealth: Payer: Self-pay | Admitting: *Deleted

## 2020-02-25 DIAGNOSIS — Z471 Aftercare following joint replacement surgery: Secondary | ICD-10-CM | POA: Diagnosis not present

## 2020-02-25 DIAGNOSIS — I480 Paroxysmal atrial fibrillation: Secondary | ICD-10-CM | POA: Diagnosis not present

## 2020-02-25 DIAGNOSIS — R338 Other retention of urine: Secondary | ICD-10-CM | POA: Diagnosis not present

## 2020-02-25 DIAGNOSIS — E785 Hyperlipidemia, unspecified: Secondary | ICD-10-CM | POA: Diagnosis not present

## 2020-02-25 DIAGNOSIS — N39490 Overflow incontinence: Secondary | ICD-10-CM | POA: Diagnosis not present

## 2020-02-25 DIAGNOSIS — M109 Gout, unspecified: Secondary | ICD-10-CM | POA: Diagnosis not present

## 2020-02-25 NOTE — Telephone Encounter (Signed)
Called pt, VM not set up Pt needs TCS with Dr. Gala Romney with propofol, ASA 2. Hold coumadin 3 days and will need lovenox bridge

## 2020-02-26 DIAGNOSIS — I480 Paroxysmal atrial fibrillation: Secondary | ICD-10-CM | POA: Diagnosis not present

## 2020-02-26 NOTE — Telephone Encounter (Signed)
Called pt back and VM not set up

## 2020-02-26 NOTE — Telephone Encounter (Signed)
Pt called back after hours and left a vm to call her back at [phone].

## 2020-02-29 ENCOUNTER — Telehealth: Payer: Self-pay | Admitting: Radiology

## 2020-02-29 DIAGNOSIS — R338 Other retention of urine: Secondary | ICD-10-CM | POA: Diagnosis not present

## 2020-02-29 DIAGNOSIS — I480 Paroxysmal atrial fibrillation: Secondary | ICD-10-CM | POA: Diagnosis not present

## 2020-02-29 DIAGNOSIS — Z471 Aftercare following joint replacement surgery: Secondary | ICD-10-CM | POA: Diagnosis not present

## 2020-02-29 DIAGNOSIS — M109 Gout, unspecified: Secondary | ICD-10-CM | POA: Diagnosis not present

## 2020-02-29 DIAGNOSIS — E785 Hyperlipidemia, unspecified: Secondary | ICD-10-CM | POA: Diagnosis not present

## 2020-02-29 DIAGNOSIS — N39490 Overflow incontinence: Secondary | ICD-10-CM | POA: Diagnosis not present

## 2020-02-29 NOTE — Telephone Encounter (Signed)
Noted  

## 2020-02-29 NOTE — Telephone Encounter (Signed)
Patient set up already for OPPT. [facility] right next door to the [place] office. She is scheduled to see you guys there on Thursday at 10:00 am and OPPT at 11:00 am. She is aware.

## 2020-02-29 NOTE — Telephone Encounter (Signed)
Thanks , time to start OP PT , should have ROV coming any day now. Can call her and set up OP PT, ask her where she wants to go , lives in [place] thanks.

## 2020-02-29 NOTE — Telephone Encounter (Signed)
Received call from [place], [place] with [facility]. She was with patient for last home visit today. Patient is complaining of some sensitivity in her right calf and thigh.  Sharyn Lull states that her TED hose were not pulled completely up, so there is some swelling. There is no redness or warmth, however, Sharyn Lull did not know how you would like to proceed.  Please advise. CB for Sharyn Lull is [phone]

## 2020-02-29 NOTE — Telephone Encounter (Signed)
Can you tell me if patient has been set up for OP PT?

## 2020-03-01 ENCOUNTER — Telehealth: Payer: Self-pay | Admitting: *Deleted

## 2020-03-01 MED ORDER — METHOCARBAMOL 500 MG PO TABS: 500.0000 mg | ORAL_TABLET | Freq: Three times a day (TID) | ORAL | 0 refills | Status: DC | PRN

## 2020-03-01 MED ORDER — TRAMADOL HCL 50 MG PO TABS: 50.0000 mg | ORAL_TABLET | Freq: Three times a day (TID) | ORAL | 0 refills | Status: DC | PRN

## 2020-03-01 NOTE — Telephone Encounter (Signed)
Ortho bundle call: Patient requesting refill of pain medication and muscle relaxer. Still having pain at back of leg where compression hose were hitting, where therapy notified our office. It has improved since yesterday, but still painful. She verbalized it is tender to touch. She is making sure compression hose are pulled up far enough when she puts them on. Taking her Warfarin as prescribed.

## 2020-03-01 NOTE — Telephone Encounter (Signed)
Ucall. OK refill robaxin. Ultram now for pain,please send in # 30  one po tid prn pain

## 2020-03-01 NOTE — Telephone Encounter (Signed)
Letter mailed

## 2020-03-01 NOTE — Telephone Encounter (Signed)
Tramadol called to pharmacy. Robaxin sent in as well.

## 2020-03-02 NOTE — Telephone Encounter (Signed)
I tried to reach patient with no answer. Will try again.

## 2020-03-03 ENCOUNTER — Ambulatory Visit (INDEPENDENT_AMBULATORY_CARE_PROVIDER_SITE_OTHER): Payer: Medicare Other | Admitting: Orthopaedic Surgery

## 2020-03-03 ENCOUNTER — Ambulatory Visit (INDEPENDENT_AMBULATORY_CARE_PROVIDER_SITE_OTHER): Payer: Medicare Other

## 2020-03-03 ENCOUNTER — Other Ambulatory Visit: Payer: Self-pay

## 2020-03-03 ENCOUNTER — Encounter: Payer: Self-pay | Admitting: Orthopaedic Surgery

## 2020-03-03 VITALS — BP 112/67 | HR 74 | Ht 63.0 in | Wt 127.0 lb

## 2020-03-03 DIAGNOSIS — M25561 Pain in right knee: Secondary | ICD-10-CM | POA: Diagnosis not present

## 2020-03-03 DIAGNOSIS — Z96651 Presence of right artificial knee joint: Secondary | ICD-10-CM

## 2020-03-03 MED ORDER — OXYCODONE-ACETAMINOPHEN 5-325 MG PO TABS: 1.0000 | ORAL_TABLET | Freq: Four times a day (QID) | ORAL | 0 refills | Status: DC | PRN

## 2020-03-03 NOTE — Progress Notes (Signed)
Post-Op Visit Note   Patient: Pam [name]           Date of Birth: 30-Jul-1940           MRN: [id number] Visit Date: 03/03/2020 PCP: Neale Burly, MD   Assessment & Plan:  Chief Complaint:  Chief Complaint  Patient presents with  . Right Knee - Routine Post Op    02/15/2020 Right TKA   Visit Diagnoses:  1. Status post total knee replacement, right     Plan: Patient states tramadol was not effective 30 tablets more of the Percocet sent in.  She is starting therapy outpatient next-door.  Staples are harvested incision looks good x-rays look good recheck 4 weeks.  Follow-Up Instructions: No follow-ups on file.   Orders:  Orders Placed This Encounter  Procedures  . XR Knee 1-2 Views Right   Meds ordered this encounter  Medications  . oxyCODONE-acetaminophen (PERCOCET/ROXICET) 5-325 MG tablet    Sig: Take 1-2 tablets by mouth every 6 (six) hours as needed for severe pain.    Dispense:  30 tablet    Refill:  0    Imaging: No results found.  PMFS History: Patient Active Problem List   Diagnosis Date Noted  . Arthritis of right knee 02/15/2020  . Diarrhea 12/30/2019  . Acute abdominal pain 12/14/2019  . Acute lower UTI 12/14/2019  . Colitis 12/14/2019  . Unilateral primary osteoarthritis, right knee 08/27/2019  . S/P total knee arthroplasty, 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  . Educated about COVID-19 virus infection 07/27/2019  . Persistent atrial fibrillation ([place]) 06/07/2019  . PAF (paroxysmal atrial fibrillation) ([place])   . Candidiasis, vagina 01/02/2019  . Hernia of abdominal cavity 10/07/2015  . Overflow incontinence of urine 07/04/2015  . Urinary retention with incomplete bladder emptying 05/09/2015  . Cervical cancer ([place]) 03/23/2015   Past Medical History:  Diagnosis Date  . Arthritis   . Atrial fibrillation ([place])   . Cancer [facility])    BREAST CANCER / CERVICAL CANCER   . Cervical cancer ([place])   . Colitis   . Dysrhythmia   . History of breast cancer 2006   Dr.  Sonny Dandy oncologist - left breast  . History of gout   . Hyperlipidemia   . Self-catheterizes urinary bladder   . Urinary retention Sept 2016   s/p hysterectomy    Family History  Problem Relation Age of Onset  . Prostate cancer Father   . Heart failure Father        77s  . Heart failure Mother        70  . Breast cancer Sister   . Colon polyps Sister        early 46s   . Colon cancer Neg Hx     Past Surgical History:  Procedure Laterality Date  . BREAST SURGERY    . CARDIOVERSION N/A 06/03/2019   Procedure: CARDIOVERSION;  Surgeon: Jerline Pain, MD;  Location: [facility] ENDOSCOPY;  Service: Cardiovascular;  Laterality: N/A;  . CATARACT EXTRACTION W/ INTRAOCULAR LENS  IMPLANT, BILATERAL    . EYE SURGERY    . INGUINAL HERNIA REPAIR Right 12/14/2015   Procedure: LAPAROSCOPIC AND OPEN RIGHT  INGUINAL HERNIA;  Surgeon: Johnathan Hausen, MD;  Location: [facility] ORS;  Service: General;  Laterality: Right;  . KNEE ARTHROSCOPY Left   . MASTECTOMY Left 2006  . MASTECTOMY WITH AXILLARY LYMPH NODE DISSECTION Left   . ROBOTIC ASSISTED TOTAL HYSTERECTOMY WITH BILATERAL SALPINGO OOPHERECTOMY Bilateral 04/07/2015   Procedure: ROBOTIC ASSISTED  RADICAL HYSTERECTOMY BILATERAL SALPINGO OOPHORECTOMY BILATERAL SENTINEL LYMPHADENETOMY;  Surgeon: Everitt Amber, MD;  Location: [facility] ORS;  Service: Gynecology;  Laterality: Bilateral;  . TOTAL KNEE ARTHROPLASTY Left 07/13/2019   Procedure: LEFT TOTAL KNEE ARTHROPLASTY-CEMENTED;  Surgeon: Marybelle Killings, MD;  Location: [facility];  Service: Orthopedics;  Laterality: Left;  . TOTAL KNEE ARTHROPLASTY Right 02/15/2020   Procedure: RIGHT TOTAL KNEE ARTHROPLASTY;  Surgeon: Marybelle Killings, MD;  Location: [facility];  Service: Orthopedics;  Laterality: Right;  . TUBAL LIGATION     Social History   Occupational History  . Not on file  Tobacco Use  . Smoking status: Former Smoker    Packs/day: 1.00    Years: 30.00    Pack years: 30.00    Types: Cigarettes    Quit date: 07/23/1994    Years  since quitting: 25.6  . Smokeless tobacco: Never Used  Vaping Use  . Vaping Use: Never used  Substance and Sexual Activity  . Alcohol use: Never    Alcohol/week: 0.0 standard drinks  . Drug use: Never  . Sexual activity: Not Currently

## 2020-03-03 NOTE — Telephone Encounter (Signed)
Patient seen in office today. 

## 2020-03-07 ENCOUNTER — Telehealth: Payer: Self-pay | Admitting: Internal Medicine

## 2020-03-07 DIAGNOSIS — M25561 Pain in right knee: Secondary | ICD-10-CM | POA: Diagnosis not present

## 2020-03-07 NOTE — Telephone Encounter (Signed)
Pt received letter to call to schedule her procedure and she wants to wait until after Dec to schedule. [phone]

## 2020-03-08 NOTE — Telephone Encounter (Signed)
Pt has upcoming OV 07/06/20. Can schedule procedure at OV since she wants to wait until after December to have procedure.  FYI to EG.

## 2020-03-09 DIAGNOSIS — M25561 Pain in right knee: Secondary | ICD-10-CM | POA: Diagnosis not present

## 2020-03-09 NOTE — Telephone Encounter (Signed)
Noted. No further recommendations.  

## 2020-03-14 DIAGNOSIS — M25561 Pain in right knee: Secondary | ICD-10-CM | POA: Diagnosis not present

## 2020-03-16 DIAGNOSIS — M25561 Pain in right knee: Secondary | ICD-10-CM | POA: Diagnosis not present

## 2020-03-18 ENCOUNTER — Telehealth: Payer: Self-pay | Admitting: *Deleted

## 2020-03-18 DIAGNOSIS — I480 Paroxysmal atrial fibrillation: Secondary | ICD-10-CM | POA: Diagnosis not present

## 2020-03-18 NOTE — Telephone Encounter (Signed)
30 day Ortho bundle call completed.

## 2020-03-19 DIAGNOSIS — Z471 Aftercare following joint replacement surgery: Secondary | ICD-10-CM | POA: Diagnosis not present

## 2020-03-21 DIAGNOSIS — M25561 Pain in right knee: Secondary | ICD-10-CM | POA: Diagnosis not present

## 2020-03-23 DIAGNOSIS — I10 Essential (primary) hypertension: Secondary | ICD-10-CM | POA: Diagnosis not present

## 2020-03-23 DIAGNOSIS — I480 Paroxysmal atrial fibrillation: Secondary | ICD-10-CM | POA: Diagnosis not present

## 2020-03-23 DIAGNOSIS — M7050 Other bursitis of knee, unspecified knee: Secondary | ICD-10-CM | POA: Diagnosis not present

## 2020-03-23 DIAGNOSIS — M25561 Pain in right knee: Secondary | ICD-10-CM | POA: Diagnosis not present

## 2020-03-29 DIAGNOSIS — M25561 Pain in right knee: Secondary | ICD-10-CM | POA: Diagnosis not present

## 2020-03-31 DIAGNOSIS — M25561 Pain in right knee: Secondary | ICD-10-CM | POA: Diagnosis not present

## 2020-04-04 DIAGNOSIS — M25561 Pain in right knee: Secondary | ICD-10-CM | POA: Diagnosis not present

## 2020-04-06 DIAGNOSIS — M25561 Pain in right knee: Secondary | ICD-10-CM | POA: Diagnosis not present

## 2020-04-07 ENCOUNTER — Ambulatory Visit (INDEPENDENT_AMBULATORY_CARE_PROVIDER_SITE_OTHER): Payer: Medicare Other | Admitting: Orthopaedic Surgery

## 2020-04-07 ENCOUNTER — Other Ambulatory Visit: Payer: Self-pay

## 2020-04-07 ENCOUNTER — Encounter: Payer: Self-pay | Admitting: Orthopaedic Surgery

## 2020-04-07 DIAGNOSIS — Z96651 Presence of right artificial knee joint: Secondary | ICD-10-CM

## 2020-04-07 NOTE — Progress Notes (Signed)
Post-Op Visit Note   Patient: Pam [name]           Date of Birth: [birth year]           MRN: [id number] Visit Date: 04/07/2020 PCP: Neale Burly, MD   Assessment & Plan: Post right total knee arthroplasty now 6 weeks postop.  Both knees lack 5 to 10 degrees reaching full extension she is able to walk rapidly without the cane but has a short stride gait and keeps her knees in slight flexion.  Both knees flexed 100 degrees.  She needs to work on prone positioning and last longer than 8 minutes with the weight on her ankle to help stretch out to full extension.  She can drive we will check her again in 2 months.  Chief Complaint:  Chief Complaint  Patient presents with  . Right Knee - Follow-up    02/15/2020 Right TKA   Visit Diagnoses:  1. Hx of total knee arthroplasty, right     Plan: She worked hard on extension both knees so she will have a longer stride length and can stand longer and does not have to stand with her knees slightly flexed and  Her quadriceps muscles.  Recheck 2 months.  Follow-Up Instructions: Return in about 2 months (around 06/07/2020).   Orders:  No orders of the defined types were placed in this encounter.  No orders of the defined types were placed in this encounter.   Imaging: No results found.  PMFS History: Patient Active Problem List   Diagnosis Date Noted  . Hx of total knee arthroplasty, right 04/07/2020  . Arthritis of right knee 02/15/2020  . Diarrhea 12/30/2019  . Acute abdominal pain 12/14/2019  . Acute lower UTI 12/14/2019  . Colitis 12/14/2019  . S/P total knee arthroplasty, 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  . Educated about COVID-19 virus infection 07/27/2019  . Persistent atrial fibrillation ([place]) 06/07/2019  . PAF (paroxysmal atrial fibrillation) ([place])   . Candidiasis, vagina 01/02/2019  . Hernia of abdominal cavity 10/07/2015  . Overflow incontinence of urine 07/04/2015  . Urinary retention with incomplete bladder emptying 05/09/2015    . Cervical cancer ([place]) 03/23/2015   Past Medical History:  Diagnosis Date  . Arthritis   . Atrial fibrillation ([place])   . Cancer [facility])    BREAST CANCER / CERVICAL CANCER   . Cervical cancer ([place])   . Colitis   . Dysrhythmia   . History of breast cancer 2006   Dr. Sonny Dandy oncologist - left breast  . History of gout   . Hyperlipidemia   . Self-catheterizes urinary bladder   . Urinary retention Sept 2016   s/p hysterectomy    Family History  Problem Relation Age of Onset  . Prostate cancer Father   . Heart failure Father        27s  . Heart failure Mother        52  . Breast cancer Sister   . Colon polyps Sister        early 44s   . Colon cancer Neg Hx     Past Surgical History:  Procedure Laterality Date  . BREAST SURGERY    . CARDIOVERSION N/A 06/03/2019   Procedure: CARDIOVERSION;  Surgeon: Jerline Pain, MD;  Location: [facility] ENDOSCOPY;  Service: Cardiovascular;  Laterality: N/A;  . CATARACT EXTRACTION W/ INTRAOCULAR LENS  IMPLANT, BILATERAL    . EYE SURGERY    . INGUINAL HERNIA REPAIR Right 12/14/2015   Procedure: LAPAROSCOPIC AND  OPEN RIGHT  INGUINAL HERNIA;  Surgeon: Johnathan Hausen, MD;  Location: [facility] ORS;  Service: General;  Laterality: Right;  . KNEE ARTHROSCOPY Left   . MASTECTOMY Left 2006  . MASTECTOMY WITH AXILLARY LYMPH NODE DISSECTION Left   . ROBOTIC ASSISTED TOTAL HYSTERECTOMY WITH BILATERAL SALPINGO OOPHERECTOMY Bilateral 04/07/2015   Procedure: ROBOTIC ASSISTED RADICAL HYSTERECTOMY BILATERAL SALPINGO OOPHORECTOMY BILATERAL SENTINEL LYMPHADENETOMY;  Surgeon: Everitt Amber, MD;  Location: [facility] ORS;  Service: Gynecology;  Laterality: Bilateral;  . TOTAL KNEE ARTHROPLASTY Left 07/13/2019   Procedure: LEFT TOTAL KNEE ARTHROPLASTY-CEMENTED;  Surgeon: Marybelle Killings, MD;  Location: [facility];  Service: Orthopedics;  Laterality: Left;  . TOTAL KNEE ARTHROPLASTY Right 02/15/2020   Procedure: RIGHT TOTAL KNEE ARTHROPLASTY;  Surgeon: Marybelle Killings, MD;  Location: [facility];  Service:  Orthopedics;  Laterality: Right;  . TUBAL LIGATION     Social History   Occupational History  . Not on file  Tobacco Use  . Smoking status: Former Smoker    Packs/day: 1.00    Years: 30.00    Pack years: 30.00    Types: Cigarettes    Quit date: 07/23/1994    Years since quitting: 25.7  . Smokeless tobacco: Never Used  Vaping Use  . Vaping Use: Never used  Substance and Sexual Activity  . Alcohol use: Never    Alcohol/week: 0.0 standard drinks  . Drug use: Never  . Sexual activity: Not Currently

## 2020-04-08 DIAGNOSIS — I480 Paroxysmal atrial fibrillation: Secondary | ICD-10-CM | POA: Diagnosis not present

## 2020-04-11 DIAGNOSIS — M25561 Pain in right knee: Secondary | ICD-10-CM | POA: Diagnosis not present

## 2020-04-14 DIAGNOSIS — M25561 Pain in right knee: Secondary | ICD-10-CM | POA: Diagnosis not present

## 2020-04-18 DIAGNOSIS — M25561 Pain in right knee: Secondary | ICD-10-CM | POA: Diagnosis not present

## 2020-04-20 DIAGNOSIS — M25561 Pain in right knee: Secondary | ICD-10-CM | POA: Diagnosis not present

## 2020-04-22 ENCOUNTER — Encounter: Payer: Self-pay | Admitting: Orthopedic Surgery

## 2020-04-25 DIAGNOSIS — M25561 Pain in right knee: Secondary | ICD-10-CM | POA: Diagnosis not present

## 2020-04-25 NOTE — Progress Notes (Signed)
79 year old female history of end-stage DJD left knee and pain comes in for preop evaluation.  Symptoms unchanged from previous visit.  She is want to proceed with left total knee replacement as scheduled.  History physical performed.  Surgical procedure discussed and all questions answered.

## 2020-04-27 DIAGNOSIS — M25561 Pain in right knee: Secondary | ICD-10-CM | POA: Diagnosis not present

## 2020-04-29 DIAGNOSIS — Z7901 Long term (current) use of anticoagulants: Secondary | ICD-10-CM | POA: Diagnosis not present

## 2020-05-13 DIAGNOSIS — I480 Paroxysmal atrial fibrillation: Secondary | ICD-10-CM | POA: Diagnosis not present

## 2020-05-17 ENCOUNTER — Telehealth: Payer: Self-pay | Admitting: *Deleted

## 2020-05-17 NOTE — Telephone Encounter (Signed)
Ortho bundle 90 day call for Right total knee replacement.

## 2020-05-19 DIAGNOSIS — H40013 Open angle with borderline findings, low risk, bilateral: Secondary | ICD-10-CM | POA: Diagnosis not present

## 2020-06-02 ENCOUNTER — Other Ambulatory Visit: Payer: Self-pay | Admitting: Cardiology

## 2020-06-02 MED ORDER — METOPROLOL TARTRATE 25 MG PO TABS: 25.0000 mg | ORAL_TABLET | Freq: Two times a day (BID) | ORAL | 1 refills | Status: DC

## 2020-06-02 MED ORDER — DILTIAZEM HCL ER COATED BEADS 360 MG PO CP24: 360.0000 mg | ORAL_CAPSULE | Freq: Every day | ORAL | 1 refills | Status: DC

## 2020-06-02 NOTE — Telephone Encounter (Signed)
*  STAT* If patient is at the pharmacy, call can be transferred to refill team.   1. Which medications need to be refilled? (please list name of each medication and dose if known)  diltiazem (CARDIZEM CD) 360 MG 24 hr capsule   metoprolol tartrate (LOPRESSOR) 25 MG tablet [[id number]]     2. Which pharmacy/location (including street and city if local pharmacy) is medication to be sent to   [place], [place] - [address] [place], [place] [address]  Phone:  [phone] Fax:  [phone]   3. Do they need a 30 day or 90 day supply? [place]

## 2020-06-03 DIAGNOSIS — I480 Paroxysmal atrial fibrillation: Secondary | ICD-10-CM | POA: Diagnosis not present

## 2020-06-09 ENCOUNTER — Other Ambulatory Visit: Payer: Self-pay

## 2020-06-09 ENCOUNTER — Encounter: Payer: Self-pay | Admitting: Orthopaedic Surgery

## 2020-06-09 ENCOUNTER — Ambulatory Visit (INDEPENDENT_AMBULATORY_CARE_PROVIDER_SITE_OTHER): Payer: Medicare Other | Admitting: Orthopaedic Surgery

## 2020-06-09 VITALS — Ht 63.0 in | Wt 127.0 lb

## 2020-06-09 DIAGNOSIS — Z96652 Presence of left artificial knee joint: Secondary | ICD-10-CM | POA: Diagnosis not present

## 2020-06-09 DIAGNOSIS — Z96651 Presence of right artificial knee joint: Secondary | ICD-10-CM

## 2020-06-09 NOTE — Progress Notes (Signed)
Office Visit Note   Patient: Pam [name]           Date of Birth: [birth year]           MRN: [id number] Visit Date: 06/09/2020              Requested by: Neale Burly, MD [place],  [place] [address] PCP: Neale Burly, MD   Assessment & Plan: Visit Diagnoses:  1. Hx of total knee arthroplasty, right   2. S/P total knee arthroplasty, left     Plan: She will work on prone position last few degrees of full extension right knee continue strengthening and try to work on her normal gait sequence.  She is walking rapidly but still tends to walk with somewhat of a stiff knee gait.  She is happy with results surgical check her back again on as-needed basis.  Follow-Up Instructions: Return if symptoms worsen or fail to improve.   Orders:  No orders of the defined types were placed in this encounter.  No orders of the defined types were placed in this encounter.     Procedures: No procedures performed   Clinical Data: No additional findings.   Subjective: Chief Complaint  Patient presents with  . Right Knee - Follow-up    02/15/2020 Right TKA  . Left Knee - Follow-up    07/13/2019 Left TKA    HPI follow-up total knee arthroplasties.  She is ambulating in a fast rate.  She still lacks 5-day degrees reaching full extension on the right knee and she will work on the some.  Left knee comes straight she has good quad strength good flexion bilaterally.  She still tends to have somewhat of a stiff knee gait with ambulation some this may be habit from ambulation for several years with knee bilateral osteoarthritis.  She is happy with the results of surgery good pain relief.  Review of Systems all other systems update unchanged.   Objective: Vital Signs: Ht 5\' 3"  (1.6 m)   Wt 127 lb (57.6 kg)   BMI 22.50 kg/m   Physical Exam Constitutional:      Appearance: She is well-developed.  HENT:     Head: Normocephalic.     Right Ear: External ear normal.      Left Ear: External ear normal.  Eyes:     Pupils: Pupils are equal, round, and reactive to light.  Neck:     Thyroid: No thyromegaly.     Trachea: No tracheal deviation.  Cardiovascular:     Rate and Rhythm: Normal rate.  Pulmonary:     Effort: Pulmonary effort is normal.  Abdominal:     Palpations: Abdomen is soft.  Skin:    General: Skin is warm and dry.  Neurological:     Mental Status: She is alert and oriented to person, place, and time.  Psychiatric:        Behavior: Behavior normal.     Ortho Exam well-healed right and left knee incision good quad strength.  Right knee lacks 5 degrees reaching full extension left knee comes full extension both knees flex past 110 degrees.  Negative logroll the hips pulses are normal.  No knee effusion.  Specialty Comments:  No specialty comments available.  Imaging: No results found.   PMFS History: Patient Active Problem List   Diagnosis Date Noted  . Hx of total knee arthroplasty, right 04/07/2020  . Diarrhea 12/30/2019  . Acute abdominal pain 12/14/2019  .  Acute lower UTI 12/14/2019  . Colitis 12/14/2019  . S/P total knee arthroplasty, 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  . Educated about COVID-19 virus infection 07/27/2019  . Persistent atrial fibrillation ([place]) 06/07/2019  . PAF (paroxysmal atrial fibrillation) ([place])   . Candidiasis, vagina 01/02/2019  . Hernia of abdominal cavity 10/07/2015  . Overflow incontinence of urine 07/04/2015  . Urinary retention with incomplete bladder emptying 05/09/2015  . Cervical cancer ([place]) 03/23/2015   Past Medical History:  Diagnosis Date  . Arthritis   . Atrial fibrillation ([place])   . Cancer [facility])    BREAST CANCER / CERVICAL CANCER   . Cervical cancer ([place])   . Colitis   . Dysrhythmia   . History of breast cancer 2006   Dr. Sonny Dandy oncologist - left breast  . History of gout   . Hyperlipidemia   . Self-catheterizes urinary bladder   . Urinary retention Sept 2016   s/p hysterectomy    Family  History  Problem Relation Age of Onset  . Prostate cancer Father   . Heart failure Father        72s  . Heart failure Mother        22  . Breast cancer Sister   . Colon polyps Sister        early 7s   . Colon cancer Neg Hx     Past Surgical History:  Procedure Laterality Date  . BREAST SURGERY    . CARDIOVERSION N/A 06/03/2019   Procedure: CARDIOVERSION;  Surgeon: Jerline Pain, MD;  Location: [facility] ENDOSCOPY;  Service: Cardiovascular;  Laterality: N/A;  . CATARACT EXTRACTION W/ INTRAOCULAR LENS  IMPLANT, BILATERAL    . EYE SURGERY    . INGUINAL HERNIA REPAIR Right 12/14/2015   Procedure: LAPAROSCOPIC AND OPEN RIGHT  INGUINAL HERNIA;  Surgeon: Johnathan Hausen, MD;  Location: [facility] ORS;  Service: General;  Laterality: Right;  . KNEE ARTHROSCOPY Left   . MASTECTOMY Left 2006  . MASTECTOMY WITH AXILLARY LYMPH NODE DISSECTION Left   . ROBOTIC ASSISTED TOTAL HYSTERECTOMY WITH BILATERAL SALPINGO OOPHERECTOMY Bilateral 04/07/2015   Procedure: ROBOTIC ASSISTED RADICAL HYSTERECTOMY BILATERAL SALPINGO OOPHORECTOMY BILATERAL SENTINEL LYMPHADENETOMY;  Surgeon: Everitt Amber, MD;  Location: [facility] ORS;  Service: Gynecology;  Laterality: Bilateral;  . TOTAL KNEE ARTHROPLASTY Left 07/13/2019   Procedure: LEFT TOTAL KNEE ARTHROPLASTY-CEMENTED;  Surgeon: Marybelle Killings, MD;  Location: [place];  Service: Orthopedics;  Laterality: Left;  . TOTAL KNEE ARTHROPLASTY Right 02/15/2020   Procedure: RIGHT TOTAL KNEE ARTHROPLASTY;  Surgeon: Marybelle Killings, MD;  Location: [place];  Service: Orthopedics;  Laterality: Right;  . TUBAL LIGATION     Social History   Occupational History  . Not on file  Tobacco Use  . Smoking status: Former Smoker    Packs/day: 1.00    Years: 30.00    Pack years: 30.00    Types: Cigarettes    Quit date: 07/23/1994    Years since quitting: 25.8  . Smokeless tobacco: Never Used  Vaping Use  . Vaping Use: Never used  Substance and Sexual Activity  . Alcohol use: Never    Alcohol/week: 0.0  standard drinks  . Drug use: Never  . Sexual activity: Not Currently

## 2020-06-15 DIAGNOSIS — R338 Other retention of urine: Secondary | ICD-10-CM | POA: Diagnosis not present

## 2020-06-15 DIAGNOSIS — N281 Cyst of kidney, acquired: Secondary | ICD-10-CM | POA: Diagnosis not present

## 2020-06-15 DIAGNOSIS — N3942 Incontinence without sensory awareness: Secondary | ICD-10-CM | POA: Diagnosis not present

## 2020-06-17 DIAGNOSIS — Z7901 Long term (current) use of anticoagulants: Secondary | ICD-10-CM | POA: Diagnosis not present

## 2020-06-22 DIAGNOSIS — M7050 Other bursitis of knee, unspecified knee: Secondary | ICD-10-CM | POA: Diagnosis not present

## 2020-06-22 DIAGNOSIS — I10 Essential (primary) hypertension: Secondary | ICD-10-CM | POA: Diagnosis not present

## 2020-06-22 DIAGNOSIS — I480 Paroxysmal atrial fibrillation: Secondary | ICD-10-CM | POA: Diagnosis not present

## 2020-07-06 ENCOUNTER — Ambulatory Visit (INDEPENDENT_AMBULATORY_CARE_PROVIDER_SITE_OTHER): Payer: Medicare Other | Admitting: Nurse Practitioner

## 2020-07-06 ENCOUNTER — Encounter: Payer: Self-pay | Admitting: Nurse Practitioner

## 2020-07-06 ENCOUNTER — Other Ambulatory Visit: Payer: Self-pay

## 2020-07-06 VITALS — BP 123/75 | HR 73 | Temp 96.6°F | Ht 63.0 in | Wt 134.6 lb

## 2020-07-06 DIAGNOSIS — K529 Noninfective gastroenteritis and colitis, unspecified: Secondary | ICD-10-CM | POA: Diagnosis not present

## 2020-07-06 DIAGNOSIS — R197 Diarrhea, unspecified: Secondary | ICD-10-CM

## 2020-07-06 DIAGNOSIS — R935 Abnormal findings on diagnostic imaging of other abdominal regions, including retroperitoneum: Secondary | ICD-10-CM

## 2020-07-06 NOTE — Patient Instructions (Signed)
Your health issues we discussed today were:   Colitis (colon infection) with diarrhea and need for follow-up colonoscopy: 1. I am glad you are doing better! 2. As we discussed, we need to complete a colonoscopy to make sure that the inflammation in your colon was due only to the infection 3. We will handle scheduling of your colonoscopy today 4. We will give you further instructions related to your Coumadin and need for Lovenox while you are holding your Coumadin 5. Call us for any worsening or severe symptoms, or rectal bleeding 6. Further recommendations will follow.  Overall I recommend:  1. Continue other current medications 2. Return for follow-up as needed or based on recommendations made after your procedure 3. Call us for any questions or concerns   ---------------------------------------------------------------  At [facility] we value your feedback. You may receive a survey about your visit today. Please share your experience as we strive to create trusting relationships with our patients to provide genuine, compassionate, quality care.  We appreciate your understanding and patience as we review any laboratory studies, imaging, and other diagnostic tests that are ordered as we care for you. Our office policy is 5 business days for review of these results, and any emergent or urgent results are addressed in a timely manner for your best interest. If you do not hear from our office in 1 week, please contact us.   We also encourage the use of MyChart, which contains your medical information for your review as well. If you are not enrolled in this feature, an access code is on this after visit summary for your convenience. Thank you for allowing [place] to be involved in your care.  It was great to see you today!  I hope you have a Merry Christmas and Happy Holidays!!    ---------------------------------------------------------------

## 2020-07-06 NOTE — Progress Notes (Signed)
Referring Provider: Neale Burly, MD Primary Care Physician:  Neale Burly, MD Primary GI:  Dr. Gala Romney  Chief Complaint  Patient presents with  . colitis    F/u, doing ok    HPI:   Pam [name] is a 79 y.o. female who presents for follow-up on colitis and diarrhea.  The patient was last seen in our office 12/30/2019 for acute abdominal pain, colitis, diarrhea of presumed infectious origin.  At that time noted recent admission in May 2021 for abdominal pain and diarrhea with CT findings of right-sided colitis associated with leukocytosis and fever likely infectious colitis.  She was started on Cipro and Flagyl and subsequently developed worsening diarrhea.  C. difficile and GI path panel negative.  Abdominal pain slowly improved and white blood cell count normalized.  Suspected infectious colitis and diarrhea influenced by antibiotics.  At her last visit still with some diarrhea after eating but improved since discharge.  Typically having 1 stool a day consistent with [place] 5-6.  Some frequent belching.  No other overt GI complaints.  Recommended request previous colonoscopy, start probiotic, colonoscopy in September or October after clearance to hold Coumadin, follow-up in 6 months.  We called her primary care again who agreed to holding Coumadin but recommended Lovenox bridge which would be handled by our office.  We attempted to contact patient to schedule colonoscopy but her voicemail was not set up and a letter was mailed.  She did call back a few days later and indicated she wanted to wait till after December to schedule her procedure.  Today she states she doing okay overall. She recently had knee surgery in July, had a recent fall and hurt her other knee. Denies any further diarrhea. Denies abdominal pain, N/V, hematochezia, melena, fever, chills, unintentional weight loss. Denies URI or flu-like symptoms. Denies loss of sense of taste or smell. The patient has not received  COVID-19 vaccination(s). Denies chest pain, dyspnea, dizziness, lightheadedness, syncope, near syncope. Denies any other upper or lower GI symptoms.  She has been able to stop probiotic.  Past Medical History:  Diagnosis Date  . Arthritis   . Atrial fibrillation ([place])   . Cancer [facility])    BREAST CANCER / CERVICAL CANCER   . Cervical cancer ([place])   . Colitis   . Dysrhythmia   . History of breast cancer 2006   Dr. Sonny Dandy oncologist - left breast  . History of gout   . Hyperlipidemia   . Self-catheterizes urinary bladder   . Urinary retention Sept 2016   s/p hysterectomy    Past Surgical History:  Procedure Laterality Date  . BREAST SURGERY    . CARDIOVERSION N/A 06/03/2019   Procedure: CARDIOVERSION;  Surgeon: Jerline Pain, MD;  Location: [facility] ENDOSCOPY;  Service: Cardiovascular;  Laterality: N/A;  . CATARACT EXTRACTION W/ INTRAOCULAR LENS  IMPLANT, BILATERAL    . EYE SURGERY    . INGUINAL HERNIA REPAIR Right 12/14/2015   Procedure: LAPAROSCOPIC AND OPEN RIGHT  INGUINAL HERNIA;  Surgeon: Johnathan Hausen, MD;  Location: [facility] ORS;  Service: General;  Laterality: Right;  . KNEE ARTHROSCOPY Left   . MASTECTOMY Left 2006  . MASTECTOMY WITH AXILLARY LYMPH NODE DISSECTION Left   . ROBOTIC ASSISTED TOTAL HYSTERECTOMY WITH BILATERAL SALPINGO OOPHERECTOMY Bilateral 04/07/2015   Procedure: ROBOTIC ASSISTED RADICAL HYSTERECTOMY BILATERAL SALPINGO OOPHORECTOMY BILATERAL SENTINEL LYMPHADENETOMY;  Surgeon: Everitt Amber, MD;  Location: [facility];  Service: Gynecology;  Laterality: Bilateral;  . TOTAL KNEE ARTHROPLASTY Left  07/13/2019   Procedure: LEFT TOTAL KNEE ARTHROPLASTY-CEMENTED;  Surgeon: Marybelle Killings, MD;  Location: [place];  Service: Orthopedics;  Laterality: Left;  . TOTAL KNEE ARTHROPLASTY Right 02/15/2020   Procedure: RIGHT TOTAL KNEE ARTHROPLASTY;  Surgeon: Marybelle Killings, MD;  Location: [place];  Service: Orthopedics;  Laterality: Right;  . TUBAL LIGATION      Current Outpatient Medications   Medication Sig Dispense Refill  . bisacodyl (DULCOLAX) 5 MG EC tablet Take 5 mg by mouth daily as needed for mild constipation.     Marland Kitchen diltiazem (CARDIZEM CD) 360 MG 24 hr capsule Take 1 capsule (360 mg total) by mouth daily. 90 capsule 1  . fexofenadine (ALLEGRA) 180 MG tablet Take 180 mg by mouth daily.    Marland Kitchen gabapentin (NEURONTIN) 100 MG capsule Take 100 mg by mouth at bedtime.  1  . metoprolol tartrate (LOPRESSOR) 25 MG tablet Take 1 tablet (25 mg total) by mouth 2 (two) times daily. 180 tablet 1  . rOPINIRole (REQUIP) 1 MG tablet Take 1 mg by mouth at bedtime.  0  . warfarin (COUMADIN) 5 MG tablet Take 5-7.5 mg by mouth daily. Alternates 61m and 7.565m    No current facility-administered medications for this visit.    Allergies as of 07/06/2020 - Review Complete 07/06/2020  Allergen Reaction Noted  . Aspirin Nausea And Vomiting 03/23/2015  . Ciprofloxacin Diarrhea 04/03/2014  . Flagyl [metronidazole] Diarrhea 04/03/2014  . Penicillins Hives 04/03/2014    Family History  Problem Relation Age of Onset  . Prostate cancer Father   . Heart failure Father        8071s. Heart failure Mother        8053. Breast cancer Sister   . Colon polyps Sister        early 5018s . Colon cancer Neg Hx     Social History   Socioeconomic History  . Marital status: Married    Spouse name: Not on file  . Number of children: 1  . Years of education: Not on file  . Highest education level: Not on file  Occupational History  . Not on file  Tobacco Use  . Smoking status: Former Smoker    Packs/day: 1.00    Years: 30.00    Pack years: 30.00    Types: Cigarettes    Quit date: 07/23/1994    Years since quitting: 25.9  . Smokeless tobacco: Never Used  Vaping Use  . Vaping Use: Never used  Substance and Sexual Activity  . Alcohol use: Never    Alcohol/week: 0.0 standard drinks  . Drug use: Never  . Sexual activity: Not Currently  Other Topics Concern  . Not on file  Social History  Narrative   One daughter.  Lives with husband.     Social Determinants of Health   Financial Resource Strain: Not on file  Food Insecurity: Not on file  Transportation Needs: Not on file  Physical Activity: Not on file  Stress: Not on file  Social Connections: Not on file    Subjective: Review of Systems  Constitutional: Negative for chills, fever, malaise/fatigue and weight loss.  HENT: Negative for congestion and sore throat.   Respiratory: Negative for cough and shortness of breath.   Cardiovascular: Negative for chest pain and palpitations.  Gastrointestinal: Negative for abdominal pain, blood in stool, constipation, diarrhea, melena, nausea and vomiting.  Musculoskeletal: Negative for joint pain and myalgias.  Skin: Negative for rash.  Neurological: Negative for dizziness and weakness.  Endo/Heme/Allergies: Does not bruise/bleed easily.  Psychiatric/Behavioral: Negative for depression. The patient is not nervous/anxious.   All other systems reviewed and are negative.    Objective: BP 123/75   Pulse 73   Temp (!) 96.6 F (35.9 C) (Temporal)   Ht _0  (1.6 m)   Wt 134 lb 9.6 oz (61.1 kg)   BMI 23.84 kg/m  Physical Exam Vitals and nursing note reviewed.  Constitutional:      General: She is not in acute distress.    Appearance: Normal appearance. She is well-developed and normal weight. She is not ill-appearing, toxic-appearing or diaphoretic.  HENT:     Head: Normocephalic and atraumatic.     Nose: No congestion or rhinorrhea.  Eyes:     General: No scleral icterus. Cardiovascular:     Rate and Rhythm: Normal rate and regular rhythm.     Heart sounds: Normal heart sounds.  Pulmonary:     Effort: Pulmonary effort is normal. No respiratory distress.     Breath sounds: Normal breath sounds.  Abdominal:     General: Bowel sounds are normal.     Palpations: Abdomen is soft. There is no hepatomegaly, splenomegaly or mass.     Tenderness: There is no abdominal  tenderness. There is no guarding or rebound.     Hernia: No hernia is present.  Skin:    General: Skin is warm and dry.     Coloration: Skin is not jaundiced.     Findings: No rash.  Neurological:     General: No focal deficit present.     Mental Status: She is alert and oriented to person, place, and time.  Psychiatric:        Attention and Perception: Attention normal.        Mood and Affect: Mood normal.        Speech: Speech normal.        Behavior: Behavior normal.        Thought Content: Thought content normal.        Cognition and Memory: Cognition and memory normal.      Assessment:  Very pleasant 79 year old female presents for follow-up on colitis and diarrhea.  The patient is also due for a repeat colonoscopy based on previous recommendations.  Overall she is doing much better.  No red flag/warning signs or symptoms.  Colitis with diarrhea: Diarrhea since resolved.  No further abdominal pain.  She has been able to stop taking a probiotic.  Having regular, soft, formed bowel movements.  No rectal bleeding.  Need for colonoscopy: The patient is still due for colonoscopy due to bowel wall thickening on CT likely resulting from colitis but cannot exclude malignant process.  She had to hold off due to knee surgery.  She is okay to schedule anytime after December.  We will work on scheduling now.  Previously got clearance from primary care to hold Coumadin for 3 days with a Lovenox bridge.  Further recommendations to follow.  Proceed with colonoscopy on propofol/MAC by Dr. Gala Romney in near future: the risks, benefits, and alternatives have been discussed with the patient in detail. The patient states understanding and desires to proceed.  The patient is currently on Neurontin and Coumadin.  History of atrial fibrillation.  We will make adjustments to Coumadin with a Lovenox bridge as discussed above.  The patient is not on any other anticoagulants, anxiolytics, chronic pain  medications, antidepressants, antidiabetics, or iron supplements. We will  plan for the procedure on propofol/MAC to promote adequate sedation.  ASA II  Plan: 1. Schedule colonoscopy as per above 2. Continue current medications 3. Return for follow-up based on post procedure recommendations or as needed for GI care.    Thank you for allowing [place] to participate in the care of [place], DNP, AGNP-C Adult & Gerontological Nurse Practitioner [facility]   07/06/2020 11:14 AM   Disclaimer: This note was dictated with voice recognition software. Similar sounding words can inadvertently be transcribed and may not be corrected upon review.

## 2020-07-07 ENCOUNTER — Telehealth: Payer: Self-pay

## 2020-07-07 DIAGNOSIS — I4819 Other persistent atrial fibrillation: Secondary | ICD-10-CM

## 2020-07-07 NOTE — Progress Notes (Signed)
Cc'ed to pcp °

## 2020-07-07 NOTE — Telephone Encounter (Signed)
She is scheduled for TCS 09/09/20 at 2:45pm.   Randall Hiss, please provide Lovenox bridge instructions.

## 2020-07-08 DIAGNOSIS — I480 Paroxysmal atrial fibrillation: Secondary | ICD-10-CM | POA: Diagnosis not present

## 2020-07-12 MED ORDER — ENOXAPARIN SODIUM 60 MG/0.6ML ~~LOC~~ SOLN: 1.0000 mg/kg | Freq: Two times a day (BID) | SUBCUTANEOUS | 0 refills | Status: DC

## 2020-07-12 NOTE — Telephone Encounter (Signed)
Please see Lovenox bridge instructions below. Patient will need 1 mg/kg twice daily x 3 days per the schedule below (60 kg = 60 mg bid). The Lovenox Rx was sent to her pharmacy. Call if any questions.    Please follow the Medication Instructions below for your procedure:   You need to hold your coumadin four days before your procedure and start a Lovenox bridge. Your procedure is scheduled for 09/09/20. RX for Lovenox has been sent to your pharmacy. Please pick up asap in case they have to order it, etc.           09/04/20  Last dose of warfarin         09/05/20   No Coumadin/warfarin         09/06/20   No Coumadin/warfarin     Begin Lovenox/enoxaparin [place] 60mg  at 10am and 60mg  at 10pm      09/07/20 No Coumadin/warfarin    Lovenox/enoxaparin [place] 60mg  at10am and 60mg  at 10pm    09/08/20    No Coumadin/warfarin    Lovenox/enoxaparin [place] 60mg  at 10am and 60mg  at 10pm     09/09/20 Day of procedue    No Coumadin/warfarin    No Lovenox/enoxaparin      Dr. Gala Romney will provide you with further instructions about your coumadin and Lovenox after your procedure.           You will be instructed when to get your next INR checked at the coumadin clinic.

## 2020-07-13 ENCOUNTER — Telehealth: Payer: Self-pay | Admitting: *Deleted

## 2020-07-13 NOTE — Telephone Encounter (Signed)
Attempted 1 year post op call for Left TKA with Dr. [name]. No answer and left VM requesting call back.

## 2020-07-13 NOTE — Telephone Encounter (Signed)
Instructions mailed with her prep instructions.

## 2020-07-18 NOTE — Progress Notes (Signed)
CERVICAL CANCER FOLLOWUP Assessment:    79 y.o. 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   S/p robotic radical hysterectomy, BSO and bilateral SLN biopsy on 04/07/15. No evidence of recurrence.  Urinary incontinence (overflow) and bladder dysfunction (lack of sensation of voiding)  Plan: 1) cervical cancer: She no longer requires scheduled surveillance visits as she is now 5 years out and free of disease.  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 Continue care with Dr Matilde Sprang appreciate his assistance.   HPI:  Pam [name] is a 79 y.o. year old initially seen in consultation on 04/25/15 for cervical cancer, referred by Dr Benjie Karvonen.  She then underwent a robotic radical hysterectomy, BSO, bilateral pelvic sentinel lymph node biopsy on [id number] without complications. Preoperative PET scan on 03/30/15 was negative for metastatic disease Her postoperative course was complicated by the development of urinary retention and overflow incontinence. She required prolonged catheterization and then intermittent self catheterization teaching.  Her final pathology revealed a 4.5cm moderately differentiated SCC of the cervix which had no LVSI, but had deep cervical stromal invasion (11 of 10mm). She had negative margins, negative nodes and negative parametrium. Her closest margin was 41mm.  She was recommended adjuvant pelvic RT to decrease risk of recurrence, but declined this after counseling with Dr Sondra Come due to patient concern regarding toxicity.   She developed postoperative urinary retention likely secondary to the radical parametrial dissection and interruption of hypogastric nerves from her radical procedure. She was encouraged to self cath every 2hours to keep herself dry (she would have incontinence if she  left it longer).  The patient was evaluated by Dr Matilde Sprang and has been continuing to self cath every 2 - 3 hours. She spontaneously voids only a little (50cc). She has some acceptance with this (less frustration).  In May, 2017 Dr Hassell Done from [facility] repaired her right indirect hernia. This was an uncomplicated surgery.   She reported symptoms of bladder prolapse.  Pap was normal September, 2017 and October, 2018. Pap showed ASCUS in October, 2019 but was negative for high risk HPV.   CT scan performed in December 2020 to evaluate hematochezia and left lower extremity edema showed no evidence of recurrence.  She did have a bout of diverticulitis and was treated with antibiotics for this.  Interval Hx. .  She has no concerning symptoms for recurrence.  She continues to intermittently self catheterize for her urinary retention.   Current Outpatient Medications on File Prior to Visit  Medication Sig Dispense Refill  . bisacodyl (DULCOLAX) 5 MG EC tablet Take 5 mg by mouth daily as needed for mild constipation.     Marland Kitchen diltiazem (CARDIZEM CD) 360 MG 24 hr capsule Take 1 capsule (360 mg total) by mouth daily. 90 capsule 1  . enoxaparin (LOVENOX) 60 MG/0.6ML injection Inject 0.6 mLs (60 mg total) into the skin every 12 (twelve) hours. 6 mL 0  . fexofenadine (ALLEGRA) 180 MG tablet Take 180 mg by mouth daily.    Marland Kitchen gabapentin (NEURONTIN) 100 MG capsule Take 100 mg by mouth at bedtime.  1  . metoprolol tartrate (LOPRESSOR) 25 MG tablet Take 1 tablet (25 mg total) by mouth 2 (two) times daily. 180 tablet 1  . rOPINIRole (REQUIP) 1 MG tablet Take 1 mg by mouth at bedtime.  0  . warfarin (COUMADIN) 5 MG  tablet Take 5-7.5 mg by mouth daily. Alternates 5mg  and 7.5mg      No current facility-administered medications on file prior to visit.   Allergies  Allergen Reactions  . Aspirin Nausea And Vomiting  . Ciprofloxacin Diarrhea  . Flagyl [Metronidazole] Diarrhea  . Penicillins Hives    20  years  Tolerated Cephalosporin Date: 02/15/2020.     Past Medical History:  Diagnosis Date  . Arthritis   . Atrial fibrillation (HCC)   . Cancer [facility])    BREAST CANCER / CERVICAL CANCER   . Cervical cancer (HCC)   . Colitis   . Dysrhythmia   . History of breast cancer 2006   Dr. 2007 oncologist - left breast  . History of gout   . Hyperlipidemia   . Self-catheterizes urinary bladder   . Urinary retention Sept 2016   s/p hysterectomy   Past Surgical History:  Procedure Laterality Date  . BREAST SURGERY    . CARDIOVERSION N/A 06/03/2019   Procedure: CARDIOVERSION;  Surgeon: 13/05/2019, MD;  Location: [facility] ENDOSCOPY;  Service: Cardiovascular;  Laterality: N/A;  . CATARACT EXTRACTION W/ INTRAOCULAR LENS  IMPLANT, BILATERAL    . EYE SURGERY    . INGUINAL HERNIA REPAIR Right 12/14/2015   Procedure: LAPAROSCOPIC AND OPEN RIGHT  INGUINAL HERNIA;  Surgeon: 12/16/2015, MD;  Location: [facility] ORS;  Service: General;  Laterality: Right;  . KNEE ARTHROSCOPY Left   . MASTECTOMY Left 2006  . MASTECTOMY WITH AXILLARY LYMPH NODE DISSECTION Left   . ROBOTIC ASSISTED TOTAL HYSTERECTOMY WITH BILATERAL SALPINGO OOPHERECTOMY Bilateral 04/07/2015   Procedure: ROBOTIC ASSISTED RADICAL HYSTERECTOMY BILATERAL SALPINGO OOPHORECTOMY BILATERAL SENTINEL LYMPHADENETOMY;  Surgeon: 04/09/2015, MD;  Location: [facility] ORS;  Service: Gynecology;  Laterality: Bilateral;  . TOTAL KNEE ARTHROPLASTY Left 07/13/2019   Procedure: LEFT TOTAL KNEE ARTHROPLASTY-CEMENTED;  Surgeon: 07/15/2019, MD;  [facility];  Service: Orthopedics;  Laterality: Left;  . TOTAL KNEE ARTHROPLASTY Right 02/15/2020   Procedure: RIGHT TOTAL KNEE ARTHROPLASTY;  Surgeon: 02/17/2020, MD;  Location: [facility] OR;  Service: Orthopedics;  Laterality: Right;  . TUBAL LIGATION     Family History  Problem Relation Age of Onset  . Prostate cancer Father   . Heart failure Father        9s  . Heart failure Mother        67  . Breast cancer Sister    . Colon polyps Sister        early 83s   . Colon cancer Neg Hx    Social History   Socioeconomic History  . Marital status: Married    Spouse name: Not on file  . Number of children: 1  . Years of education: Not on file  . Highest education level: Not on file  Occupational History  . Not on file  Tobacco Use  . Smoking status: Former Smoker    Packs/day: 1.00    Years: 30.00    Pack years: 30.00    Types: Cigarettes    Quit date: 07/23/1994    Years since quitting: 26.0  . Smokeless tobacco: Never Used  Vaping Use  . Vaping Use: Never used  Substance and Sexual Activity  . Alcohol use: Never    Alcohol/week: 0.0 standard drinks  . Drug use: Never  . Sexual activity: Not Currently  Other Topics Concern  . Not on file  Social History Narrative   One daughter.  Lives with husband.     Social Determinants  of Health   Financial Resource Strain: Not on file  Food Insecurity: Not on file  Transportation Needs: Not on file  Physical Activity: Not on file  Stress: Not on file  Social Connections: Not on file  Intimate Partner Violence: Not on file     Review of systems: Constitutional:  She has no weight gain or weight loss. She has no fever or chills. Eyes: No blurred vision Ears, Nose, Mouth, Throat: No dizziness, headaches or changes in hearing. No mouth sores. Cardiovascular: No chest pain, palpitations or edema. Respiratory:  No shortness of breath, wheezing or cough Gastrointestinal: She has normal bowel movements without diarrhea or constipation. She denies any nausea or vomiting. She denies blood in her stool or heart burn. Genitourinary:  She denies pelvic pain, pelvic pressure. She has no hematuria, dysuria, or incontinence. + urinary retention + bladder prolapse symptoms Musculoskeletal: Denies muscle weakness or joint pains  Skin:  She has no skin changes, rashes or itching Neurological:  Denies dizziness or headaches. No neuropathy, no numbness or  tingling. Psychiatric:  She denies depression or anxiety. Hematologic/Lymphatic:   No easy bruising or bleeding   Physical Exam: Blood pressure 116/68, pulse 72, temperature (!) 97.1 F (36.2 C), temperature source Tympanic, resp. rate 18, height 5\' 3"  (1.6 m), weight 137 lb 9.6 oz (62.4 kg), SpO2 98 %. General: Well dressed, well nourished in no apparent distress.   HEENT:  Normocephalic and atraumatic, no lesions.  Extraocular muscles intact. Sclerae anicteric. Pupils equal, round, reactive. No mouth sores or ulcers. Thyroid is normal size, not nodular, midline. Skin:  No lesions or rashes. Abdomen:  Soft, nontender, nondistended.  No palpable masses.  No hepatosplenomegaly.  No ascites. Normal bowel sounds. Incisions are well healed including hernia incision. Genitourinary: Normal EGBUS  No lesions. No bleeding or discharge.  No cul de sac fullness. Bladder not palpably distended with urine.  No gross prolapse noted. Pap taken.  Extremities: mild + left lower extremity edema.  Psychiatric: Mood and affect are appropriate. Neurological: Awake, alert and oriented x 3. Sensation is intact, no neuropathy.  Musculoskeletal: No pain, normal strength and range of motion.  Thereasa Solo, MD

## 2020-07-19 ENCOUNTER — Telehealth: Payer: Self-pay | Admitting: *Deleted

## 2020-07-19 NOTE — Telephone Encounter (Signed)
1 year Ortho bundle call for Left TKA done by Dr. Ophelia Charter. No issues and no concerns at this time.

## 2020-07-21 ENCOUNTER — Other Ambulatory Visit (HOSPITAL_COMMUNITY)
Admission: RE | Admit: 2020-07-21 | Discharge: 2020-07-21 | Disposition: A | Discharge status: Home / Self Care | Payer: Medicare Other | Source: Ambulatory Visit | Attending: Gynecologic Oncology | Admitting: Gynecologic Oncology

## 2020-07-21 ENCOUNTER — Encounter: Payer: Self-pay | Admitting: Gynecologic Oncology

## 2020-07-21 ENCOUNTER — Inpatient Hospital Stay: Payer: Medicare Other | Attending: Gynecologic Oncology | Admitting: Gynecologic Oncology

## 2020-07-21 ENCOUNTER — Other Ambulatory Visit: Payer: Self-pay

## 2020-07-21 VITALS — BP 116/68 | HR 72 | Temp 97.1°F | Resp 18 | Ht 63.0 in | Wt 137.6 lb

## 2020-07-21 DIAGNOSIS — Z8541 Personal history of malignant neoplasm of cervix uteri: Secondary | ICD-10-CM | POA: Insufficient documentation

## 2020-07-21 DIAGNOSIS — C539 Malignant neoplasm of cervix uteri, unspecified: Secondary | ICD-10-CM | POA: Diagnosis not present

## 2020-07-21 DIAGNOSIS — Z90710 Acquired absence of both cervix and uterus: Secondary | ICD-10-CM | POA: Diagnosis not present

## 2020-07-21 DIAGNOSIS — Z79899 Other long term (current) drug therapy: Secondary | ICD-10-CM | POA: Diagnosis not present

## 2020-07-21 DIAGNOSIS — R87620 Atypical squamous cells of undetermined significance on cytologic smear of vagina (ASC-US): Secondary | ICD-10-CM | POA: Insufficient documentation

## 2020-07-21 DIAGNOSIS — Z87891 Personal history of nicotine dependence: Secondary | ICD-10-CM | POA: Insufficient documentation

## 2020-07-21 DIAGNOSIS — I4891 Unspecified atrial fibrillation: Secondary | ICD-10-CM | POA: Diagnosis not present

## 2020-07-21 DIAGNOSIS — Z90722 Acquired absence of ovaries, bilateral: Secondary | ICD-10-CM | POA: Insufficient documentation

## 2020-07-21 DIAGNOSIS — R339 Retention of urine, unspecified: Secondary | ICD-10-CM | POA: Insufficient documentation

## 2020-07-21 DIAGNOSIS — Z853 Personal history of malignant neoplasm of breast: Secondary | ICD-10-CM | POA: Diagnosis not present

## 2020-07-21 DIAGNOSIS — Z01419 Encounter for gynecological examination (general) (routine) without abnormal findings: Secondary | ICD-10-CM | POA: Insufficient documentation

## 2020-07-21 DIAGNOSIS — Z1151 Encounter for screening for human papillomavirus (HPV): Secondary | ICD-10-CM | POA: Diagnosis not present

## 2020-07-21 DIAGNOSIS — Z7901 Long term (current) use of anticoagulants: Secondary | ICD-10-CM | POA: Insufficient documentation

## 2020-07-21 NOTE — Patient Instructions (Signed)
You are released from follow-up with Dr Andrey Farmer as it has been more than 5 years since treatment of your cervical cancer and you remain cancer free.  If you have a new vaginal concern, or pelvic issue, please call Dr Oliver Hum office at [phone] and ask to speak with Warner Mccreedy, Dr Oliver Hum nurse practitioner, who can see you if necessary.

## 2020-07-25 DIAGNOSIS — R87610 Atypical squamous cells of undetermined significance on cytologic smear of cervix (ASC-US): Secondary | ICD-10-CM | POA: Diagnosis not present

## 2020-07-27 LAB — CYTOLOGY - PAP
Comment: NEGATIVE
Diagnosis: UNDETERMINED — AB
High risk HPV: NEGATIVE

## 2020-08-03 ENCOUNTER — Telehealth: Payer: Self-pay

## 2020-08-03 NOTE — Telephone Encounter (Signed)
-----   Message from Dorothyann Gibbs, NP sent at 08/02/2020  7:40 PM EST ----- Please let Pam [name] know that her pap smear showed some atypical cells but HPV high risk is negative meaning the abnormal cells are related to age changes to the vaginal tissues (atrophy). No intervention is needed.

## 2020-08-03 NOTE — Telephone Encounter (Signed)
Told Ms Carreras the results of the Pap Smear as noted below by Joylene John, NP.

## 2020-08-05 DIAGNOSIS — I480 Paroxysmal atrial fibrillation: Secondary | ICD-10-CM | POA: Diagnosis not present

## 2020-08-16 DIAGNOSIS — I482 Chronic atrial fibrillation, unspecified: Secondary | ICD-10-CM | POA: Insufficient documentation

## 2020-08-16 NOTE — Progress Notes (Signed)
Cardiology Office Note   Date:  08/18/2020   ID:  Pam [name], Pam [name] 1941-05-13, MRN [id number]  PCP:  Neale Burly, MD  Cardiologist:   Minus Breeding, MD  Chief Complaint  Patient presents with  . Atrial Fibrillation      History of Present Illness: Pam [name] is a 80 y.o. female who was referred by Neale Burly, MD for evaluation of atrial fib.   After I last saw her I ordered an echo which was unremarkable. She had cardioversion but only stayed in sinus rhythm for about two days.   She went back into atrial fib.  She is now being managed with rate control.    Since I last saw her she has done well.  The patient denies any new symptoms such as chest discomfort, neck or arm discomfort. There has been no new shortness of breath, PND or orthopnea. There have been no reported palpitations, presyncope or syncope.  She would not notice that she is in atrial fib.    She had knee surgery.  She says she is now have both of them done and she is recovered nicely from this.     Past Medical History:  Diagnosis Date  . Arthritis   . Atrial fibrillation ([place])   . Cancer [facility])    BREAST CANCER / CERVICAL CANCER   . Cervical cancer ([place])   . Colitis   . Dysrhythmia   . History of breast cancer 2006   Dr. Sonny Dandy oncologist - left breast  . History of gout   . Hyperlipidemia   . Self-catheterizes urinary bladder   . Urinary retention Sept 2016   s/p hysterectomy    Past Surgical History:  Procedure Laterality Date  . BREAST SURGERY    . CARDIOVERSION N/A 06/03/2019   Procedure: CARDIOVERSION;  Surgeon: Jerline Pain, MD;  Location[facility] ENDOSCOPY;  Service: Cardiovascular;  Laterality: N/A;  . CATARACT EXTRACTION W/ INTRAOCULAR LENS  IMPLANT, BILATERAL    . EYE SURGERY    . INGUINAL HERNIA REPAIR Right 12/14/2015   Procedure: LAPAROSCOPIC AND OPEN RIGHT  INGUINAL HERNIA;  Surgeon: Johnathan Hausen, MD;  Location: [facility] ORS;  Service: General;  Laterality: Right;  . KNEE  ARTHROSCOPY Left   . MASTECTOMY Left 2006  . MASTECTOMY WITH AXILLARY LYMPH NODE DISSECTION Left   . ROBOTIC ASSISTED TOTAL HYSTERECTOMY WITH BILATERAL SALPINGO OOPHERECTOMY Bilateral 04/07/2015   Procedure: ROBOTIC ASSISTED RADICAL HYSTERECTOMY BILATERAL SALPINGO OOPHORECTOMY BILATERAL SENTINEL LYMPHADENETOMY;  Surgeon: Everitt Amber, MD;  Location: [facility] ORS;  Service: Gynecology;  Laterality: Bilateral;  . TOTAL KNEE ARTHROPLASTY Left 07/13/2019   Procedure: LEFT TOTAL KNEE ARTHROPLASTY-CEMENTED;  Surgeon: Marybelle Killings, MD;  Location: [facility];  Service: Orthopedics;  Laterality: Left;  . TOTAL KNEE ARTHROPLASTY Right 02/15/2020   Procedure: RIGHT TOTAL KNEE ARTHROPLASTY;  Surgeon: Marybelle Killings, MD;  Location: [place];  Service: Orthopedics;  Laterality: Right;  . TUBAL LIGATION       Current Outpatient Medications  Medication Sig Dispense Refill  . bisacodyl (DULCOLAX) 5 MG EC tablet Take 5 mg by mouth daily as needed for mild constipation.     Marland Kitchen diltiazem (CARDIZEM CD) 360 MG 24 hr capsule Take 1 capsule (360 mg total) by mouth daily. 90 capsule 1  . fexofenadine (ALLEGRA) 180 MG tablet Take 180 mg by mouth daily.    Marland Kitchen gabapentin (NEURONTIN) 100 MG capsule Take 100 mg by mouth at bedtime.  1  . metoprolol  tartrate (LOPRESSOR) 25 MG tablet Take 1 tablet (25 mg total) by mouth 2 (two) times daily. 180 tablet 1  . rOPINIRole (REQUIP) 1 MG tablet Take 1 mg by mouth at bedtime.  0  . warfarin (COUMADIN) 5 MG tablet Take 5-7.5 mg by mouth daily. Alternates 5mg  and 7.5mg     . enoxaparin (LOVENOX) 60 MG/0.6ML injection Inject 0.6 mLs (60 mg total) into the skin every 12 (twelve) hours. 6 mL 0   No current facility-administered medications for this visit.    Allergies:   Aspirin, Ciprofloxacin, Flagyl [metronidazole], and Penicillins    ROS:  Please see the history of present illness.   Otherwise, review of systems are positive for none.   All other systems are reviewed and negative.    PHYSICAL  EXAM: VS:  BP 128/76   Pulse 84   Ht 5\' 3"  (1.6 m)   Wt 138 lb (62.6 kg)   BMI 24.45 kg/m  , BMI Body mass index is 24.45 kg/m. GENERAL:  Well appearing NECK:  No jugular venous distention, waveform within normal limits, carotid upstroke brisk and symmetric, no bruits, no thyromegaly LUNGS:  Clear to auscultation bilaterally CHEST:  Unremarkable HEART:  PMI not displaced or sustained,S1 and S2 within normal limits, no S3, no clicks, no rubs, no murmurs, irregular ABD:  Flat, positive bowel sounds normal in frequency in pitch, no bruits, no rebound, no guarding, no midline pulsatile mass, no hepatomegaly, no splenomegaly EXT:  2 plus pulses throughout, no edema, no cyanosis no clubbing   EKG:  EKG is  ordered today. Atrial fibrillation, rate 84, axis within normal limits, intervals within normal limits, no acute ST-T wave changes.  Recent Labs: 12/16/2019: Magnesium 1.9 02/11/2020: ALT <5 02/16/2020: BUN 8; Creatinine, Ser 0.89; Hemoglobin 10.5; Platelets 279; Potassium 4.3; Sodium 136    Lipid Panel No results found for: CHOL, TRIG, HDL, CHOLHDL, VLDL, LDLCALC, LDLDIRECT    Wt Readings from Last 3 Encounters:  08/17/20 138 lb (62.6 kg)  07/21/20 137 lb 9.6 oz (62.4 kg)  07/06/20 134 lb 9.6 oz (61.1 kg)      Other studies Reviewed: Additional studies/ records that were reviewed today include: Labs. Review of the above records demonstrates:  Please see elsewhere in the note.     ASSESSMENT AND PLAN:  ATRIAL FIB:Ms.Pam Sale Corumhas a CHA2DS2 - VASc score of 3.    She states she had colitis recently simply and I do see that this summer when she had her knees done her hemoglobin was somewhat low.  However, she has a GI appointment and she is having no active signs of bleeding.  She is to have colonoscopy.  For now it seems that she is tolerating the anticoagulation and she can continue.  She can hold this as needed for any necessary procedures.    Current medicines are  reviewed at length with the patient today.  The patient does not have concerns regarding medicines.  The following changes have been made:  no change  Labs/ tests ordered today include:   Orders Placed This Encounter  Procedures  . EKG 12-Lead     Disposition:   FU with me in one year.     Signed, Minus Breeding, MD  08/18/2020 12:54 PM    [facility]

## 2020-08-17 ENCOUNTER — Other Ambulatory Visit: Payer: Self-pay

## 2020-08-17 ENCOUNTER — Ambulatory Visit (INDEPENDENT_AMBULATORY_CARE_PROVIDER_SITE_OTHER): Payer: Medicare Other | Admitting: Cardiology

## 2020-08-17 ENCOUNTER — Encounter: Payer: Self-pay | Admitting: Cardiology

## 2020-08-17 VITALS — BP 128/76 | HR 84 | Ht 63.0 in | Wt 138.0 lb

## 2020-08-17 DIAGNOSIS — I482 Chronic atrial fibrillation, unspecified: Secondary | ICD-10-CM

## 2020-08-17 NOTE — Patient Instructions (Signed)
Medication Instructions:  The current medical regimen is effective;  continue present plan and medications.  *If you need a refill on your cardiac medications before your next appointment, please call your pharmacy*  Follow-Up: At [facility], you and your health needs are our priority.  As part of our continuing mission to provide you with exceptional heart care, we have created designated Provider Care Teams.  These Care Teams include your primary Cardiologist (physician) and Advanced Practice Providers (APPs -  Physician Assistants and Nurse Practitioners) who all work together to provide you with the care you need, when you need it.  We recommend signing up for the patient portal called "MyChart".  Sign up information is provided on this After Visit Summary.  MyChart is used to connect with patients for Virtual Visits (Telemedicine).  Patients are able to view lab/test results, encounter notes, upcoming appointments, etc.  Non-urgent messages can be sent to your provider as well.   To learn more about what you can do with MyChart, go to [email or web address].com.    Your next appointment:   12 month(s)  The format for your next appointment:   In Person  Provider:   James Hochrein, MD   Thank you for choosing [facility]!!     

## 2020-08-18 ENCOUNTER — Encounter: Payer: Self-pay | Admitting: Cardiology

## 2020-08-19 DIAGNOSIS — M7050 Other bursitis of knee, unspecified knee: Secondary | ICD-10-CM | POA: Diagnosis not present

## 2020-08-19 DIAGNOSIS — I10 Essential (primary) hypertension: Secondary | ICD-10-CM | POA: Diagnosis not present

## 2020-08-19 DIAGNOSIS — I480 Paroxysmal atrial fibrillation: Secondary | ICD-10-CM | POA: Diagnosis not present

## 2020-08-19 DIAGNOSIS — D638 Anemia in other chronic diseases classified elsewhere: Secondary | ICD-10-CM | POA: Diagnosis not present

## 2020-09-05 ENCOUNTER — Telehealth: Payer: Self-pay | Admitting: *Deleted

## 2020-09-05 NOTE — Telephone Encounter (Signed)
Patient called in. She was supposed to stop her wafarin today and forgot and took it. Is she still okay to proceed? She is scheduled for 2/18 with Dr. Gala Romney. She is supposed to start lovenox tomorrow. Randall Hiss is not in today. Fowarding to [employer]. thanks

## 2020-09-05 NOTE — Telephone Encounter (Signed)
I believe she should be ok. Please continue with plan and review with Randall Hiss tomorrow.

## 2020-09-05 NOTE — Telephone Encounter (Signed)
Pt called office, informed her of Anna's recommendation.

## 2020-09-07 ENCOUNTER — Other Ambulatory Visit (HOSPITAL_COMMUNITY)
Admission: RE | Admit: 2020-09-07 | Discharge: 2020-09-07 | Disposition: A | Discharge status: Home / Self Care | Payer: Medicare Other | Source: Ambulatory Visit | Attending: Internal Medicine | Admitting: Internal Medicine

## 2020-09-07 ENCOUNTER — Other Ambulatory Visit: Payer: Self-pay

## 2020-09-07 DIAGNOSIS — Z20822 Contact with and (suspected) exposure to covid-19: Secondary | ICD-10-CM | POA: Insufficient documentation

## 2020-09-07 DIAGNOSIS — Z01812 Encounter for preprocedural laboratory examination: Secondary | ICD-10-CM | POA: Diagnosis not present

## 2020-09-07 LAB — SARS CORONAVIRUS 2 (TAT 6-24 HRS): SARS Coronavirus 2: NEGATIVE

## 2020-09-07 NOTE — Telephone Encounter (Signed)
Called and informed pt.  

## 2020-09-07 NOTE — Telephone Encounter (Signed)
Agree, no further recommendations. Should still be ample time to clear warfarin from her system prior to her procedure.

## 2020-09-09 ENCOUNTER — Ambulatory Visit (HOSPITAL_COMMUNITY): Payer: Medicare Other | Admitting: Anesthesiology

## 2020-09-09 ENCOUNTER — Encounter (HOSPITAL_COMMUNITY): Payer: Self-pay | Admitting: Internal Medicine

## 2020-09-09 ENCOUNTER — Encounter (HOSPITAL_COMMUNITY)
Admission: RE | Disposition: A | Discharge status: Home / Self Care | Payer: Self-pay | Source: Home / Self Care | Attending: Internal Medicine

## 2020-09-09 ENCOUNTER — Ambulatory Visit (HOSPITAL_COMMUNITY)
Admission: RE | Admit: 2020-09-09 | Discharge: 2020-09-09 | Disposition: A | Discharge status: Home / Self Care | Payer: Medicare Other | Attending: Internal Medicine | Admitting: Internal Medicine

## 2020-09-09 ENCOUNTER — Other Ambulatory Visit: Payer: Self-pay

## 2020-09-09 DIAGNOSIS — Z8541 Personal history of malignant neoplasm of cervix uteri: Secondary | ICD-10-CM | POA: Diagnosis not present

## 2020-09-09 DIAGNOSIS — I4891 Unspecified atrial fibrillation: Secondary | ICD-10-CM | POA: Diagnosis not present

## 2020-09-09 DIAGNOSIS — Z96653 Presence of artificial knee joint, bilateral: Secondary | ICD-10-CM | POA: Insufficient documentation

## 2020-09-09 DIAGNOSIS — Z87891 Personal history of nicotine dependence: Secondary | ICD-10-CM | POA: Insufficient documentation

## 2020-09-09 DIAGNOSIS — K529 Noninfective gastroenteritis and colitis, unspecified: Secondary | ICD-10-CM | POA: Diagnosis not present

## 2020-09-09 DIAGNOSIS — Z8249 Family history of ischemic heart disease and other diseases of the circulatory system: Secondary | ICD-10-CM | POA: Insufficient documentation

## 2020-09-09 DIAGNOSIS — Z886 Allergy status to analgesic agent status: Secondary | ICD-10-CM | POA: Diagnosis not present

## 2020-09-09 DIAGNOSIS — Z90710 Acquired absence of both cervix and uterus: Secondary | ICD-10-CM | POA: Diagnosis not present

## 2020-09-09 DIAGNOSIS — Z79899 Other long term (current) drug therapy: Secondary | ICD-10-CM | POA: Insufficient documentation

## 2020-09-09 DIAGNOSIS — Z9012 Acquired absence of left breast and nipple: Secondary | ICD-10-CM | POA: Diagnosis not present

## 2020-09-09 DIAGNOSIS — E785 Hyperlipidemia, unspecified: Secondary | ICD-10-CM | POA: Diagnosis not present

## 2020-09-09 DIAGNOSIS — Z8371 Family history of colonic polyps: Secondary | ICD-10-CM | POA: Insufficient documentation

## 2020-09-09 DIAGNOSIS — Z7901 Long term (current) use of anticoagulants: Secondary | ICD-10-CM | POA: Diagnosis not present

## 2020-09-09 DIAGNOSIS — R935 Abnormal findings on diagnostic imaging of other abdominal regions, including retroperitoneum: Secondary | ICD-10-CM | POA: Diagnosis not present

## 2020-09-09 DIAGNOSIS — R197 Diarrhea, unspecified: Secondary | ICD-10-CM | POA: Diagnosis not present

## 2020-09-09 DIAGNOSIS — Z881 Allergy status to other antibiotic agents status: Secondary | ICD-10-CM | POA: Diagnosis not present

## 2020-09-09 DIAGNOSIS — Z853 Personal history of malignant neoplasm of breast: Secondary | ICD-10-CM | POA: Insufficient documentation

## 2020-09-09 DIAGNOSIS — Z90722 Acquired absence of ovaries, bilateral: Secondary | ICD-10-CM | POA: Insufficient documentation

## 2020-09-09 DIAGNOSIS — Z880 Allergy status to penicillin: Secondary | ICD-10-CM | POA: Diagnosis not present

## 2020-09-09 DIAGNOSIS — K5730 Diverticulosis of large intestine without perforation or abscess without bleeding: Secondary | ICD-10-CM | POA: Diagnosis not present

## 2020-09-09 DIAGNOSIS — Z803 Family history of malignant neoplasm of breast: Secondary | ICD-10-CM | POA: Insufficient documentation

## 2020-09-09 HISTORY — PX: COLONOSCOPY WITH PROPOFOL: SHX5780

## 2020-09-09 HISTORY — PX: BIOPSY: SHX5522

## 2020-09-09 SURGERY — COLONOSCOPY WITH PROPOFOL
Anesthesia: General

## 2020-09-09 MED ORDER — STERILE WATER FOR IRRIGATION IR SOLN
Status: DC | PRN
  Administered 2020-09-09: 100 mL

## 2020-09-09 MED ORDER — PROPOFOL 10 MG/ML IV BOLUS
INTRAVENOUS | Status: DC | PRN
Start: 2020-09-09 — End: 2020-09-09
  Administered 2020-09-09 (×2): 50 mg via INTRAVENOUS

## 2020-09-09 MED ORDER — LACTATED RINGERS IV SOLN: INTRAVENOUS | Status: DC

## 2020-09-09 MED ORDER — PROPOFOL 500 MG/50ML IV EMUL
INTRAVENOUS | Status: DC | PRN
  Administered 2020-09-09: 150 ug/kg/min via INTRAVENOUS

## 2020-09-09 NOTE — H&P (Signed)
@LOGO @   Primary Care Physician:  Neale Burly, MD Primary Gastroenterologist:  Dr. Gala Romney  Pre-Procedure History & Physical: HPI:  Pam [name] is a 80 y.o. female here for colonoscopy to further evaluate chronic diarrhea.  No improvement recently.  Coumadin held.  Bridging with Lovenox per attending request.  Last colonoscopy about 11 years ago-reportedly negative.  Past Medical History:  Diagnosis Date  . Arthritis   . Atrial fibrillation ([place])   . Cancer [facility])    BREAST CANCER / CERVICAL CANCER   . Cervical cancer ([place])   . Colitis   . Dysrhythmia   . History of breast cancer 2006   Dr. Sonny Dandy oncologist - left breast  . History of gout   . Hyperlipidemia   . Self-catheterizes urinary bladder   . Urinary retention Sept 2016   s/p hysterectomy    Past Surgical History:  Procedure Laterality Date  . BREAST SURGERY    . CARDIOVERSION N/A 06/03/2019   Procedure: CARDIOVERSION;  Surgeon: Jerline Pain, MD;  Location: [facility] ENDOSCOPY;  Service: Cardiovascular;  Laterality: N/A;  . CATARACT EXTRACTION W/ INTRAOCULAR LENS  IMPLANT, BILATERAL    . EYE SURGERY    . INGUINAL HERNIA REPAIR Right 12/14/2015   Procedure: LAPAROSCOPIC AND OPEN RIGHT  INGUINAL HERNIA;  Surgeon: Johnathan Hausen, MD;  Location: [facility] ORS;  Service: General;  Laterality: Right;  . KNEE ARTHROSCOPY Left   . MASTECTOMY Left 2006  . MASTECTOMY WITH AXILLARY LYMPH NODE DISSECTION Left   . ROBOTIC ASSISTED TOTAL HYSTERECTOMY WITH BILATERAL SALPINGO OOPHERECTOMY Bilateral 04/07/2015   Procedure: ROBOTIC ASSISTED RADICAL HYSTERECTOMY BILATERAL SALPINGO OOPHORECTOMY BILATERAL SENTINEL LYMPHADENETOMY;  Surgeon: Everitt Amber, MD;  Location: [facility] ORS;  Service: Gynecology;  Laterality: Bilateral;  . TOTAL KNEE ARTHROPLASTY Left 07/13/2019   Procedure: LEFT TOTAL KNEE ARTHROPLASTY-CEMENTED;  Surgeon: Marybelle Killings, MD;  Location: [place];  Service: Orthopedics;  Laterality: Left;  . TOTAL KNEE ARTHROPLASTY Right 02/15/2020    Procedure: RIGHT TOTAL KNEE ARTHROPLASTY;  Surgeon: Marybelle Killings, MD;  Location: [place];  Service: Orthopedics;  Laterality: Right;  . TUBAL LIGATION      Prior to Admission medications   Medication Sig Start Date End Date Taking? Authorizing Provider  bisacodyl (DULCOLAX) 5 MG EC tablet Take 5 mg by mouth daily as needed for mild constipation.    Yes [provider]  diltiazem (CARDIZEM CD) 360 MG 24 hr capsule Take 1 capsule (360 mg total) by mouth daily. 06/02/20 08/31/20 Yes Minus Breeding, MD  enoxaparin (LOVENOX) 60 MG/0.6ML injection Inject 0.6 mLs (60 mg total) into the skin every 12 (twelve) hours. 07/12/20  Yes Carlis Stable, NP  fexofenadine (ALLEGRA) 180 MG tablet Take 180 mg by mouth daily.   Yes [provider]  gabapentin (NEURONTIN) 300 MG capsule Take 300 mg by mouth at bedtime. 08/15/20  Yes [provider]  metoprolol tartrate (LOPRESSOR) 25 MG tablet Take 1 tablet (25 mg total) by mouth 2 (two) times daily. 06/02/20 05/28/21 Yes Hochrein, Jeneen Rinks, MD  rOPINIRole (REQUIP) 1 MG tablet Take 1 mg by mouth at bedtime. 04/05/17  Yes [provider]  traMADol (ULTRAM) 50 MG tablet Take 50 mg by mouth every 12 (twelve) hours as needed (pain).   Yes [provider]  warfarin (COUMADIN) 5 MG tablet Take 5 mg by mouth every other day. In the morning (alternates with 7.5 mg tablet)   Yes [provider]  warfarin (COUMADIN) 7.5 MG tablet Take 7.5 mg  by mouth every other day. In the morning (alternates with 5 mg tablet)   Yes [provider]    Allergies as of 07/07/2020 - Review Complete 07/06/2020  Allergen Reaction Noted  . Aspirin Nausea And Vomiting 03/23/2015  . Ciprofloxacin Diarrhea 04/03/2014  . Flagyl [metronidazole] Diarrhea 04/03/2014  . Penicillins Hives 04/03/2014    Family History  Problem Relation Age of Onset  . Prostate cancer Father   . Heart failure Father        46s  . Heart failure Mother        33   . Breast cancer Sister   . Colon polyps Sister        early 16s   . Colon cancer Neg Hx     Social History   Socioeconomic History  . Marital status: Married    Spouse name: Not on file  . Number of children: 1  . Years of education: Not on file  . Highest education level: Not on file  Occupational History  . Not on file  Tobacco Use  . Smoking status: Former Smoker    Packs/day: 1.00    Years: 30.00    Pack years: 30.00    Types: Cigarettes    Quit date: 07/23/1994    Years since quitting: 26.1  . Smokeless tobacco: Never Used  Vaping Use  . Vaping Use: Never used  Substance and Sexual Activity  . Alcohol use: Never    Alcohol/week: 0.0 standard drinks  . Drug use: Never  . Sexual activity: Not Currently  Other Topics Concern  . Not on file  Social History Narrative   One daughter.  Lives with husband.     Social Determinants of Health   Financial Resource Strain: Not on file  Food Insecurity: Not on file  Transportation Needs: Not on file  Physical Activity: Not on file  Stress: Not on file  Social Connections: Not on file  Intimate Partner Violence: Not on file    Review of Systems: See HPI, otherwise negative ROS  Physical Exam: BP 105/83   Pulse (!) 111   Temp 97.6 F (36.4 C) (Oral)   Resp (!) 21   Ht 5\' 3"  (1.6 m)   Wt 61.2 kg   SpO2 97%   BMI 23.91 kg/m  General:   Alert,  Well-developed, well-nourished, pleasant and cooperative in NAD Neck:  Supple; no masses or thyromegaly. No significant cervical adenopathy. Lungs:  Clear throughout to auscultation.   No wheezes, crackles, or rhonchi. No acute distress. Heart:  Regular rate and rhythm; no murmurs, clicks, rubs,  or gallops. Abdomen: Non-distended, normal bowel sounds.  Soft and nontender without appreciable mass or hepatosplenomegaly.  Pulses:  Normal pulses noted. Extremities:  Without clubbing or edema.  Impression/Plan: 80 year old lady with chronic diarrhea.  Here for colonoscopy to  further evaluate.  Coumadin held; Lovenox of bridging performed.  Offer the patient a diagnostic colonoscopy today per plan.  The risks, benefits, limitations, alternatives and imponderables have been reviewed with the patient. Questions have been answered. All parties are agreeable.       Notice: This dictation was prepared with Dragon dictation along with smaller phrase technology. Any transcriptional errors that result from this process are unintentional and may not be corrected upon review.

## 2020-09-09 NOTE — Progress Notes (Signed)
Verified w/ Dr. Gala Romney pt is to resume both coumadin and lovenox today.  Rx called by Janeece Riggers, RN to [facility] in [place] for Lovenox 60mg  SQ BID #6/0.

## 2020-09-09 NOTE — Anesthesia Postprocedure Evaluation (Signed)
Anesthesia Post Note  Patient: Pam [name]  Procedure(s) Performed: COLONOSCOPY WITH PROPOFOL (N/A ) BIOPSY  Patient location during evaluation: Phase II Anesthesia Type: General Level of consciousness: awake, oriented, awake and alert and patient cooperative Pain management: satisfactory to patient Vital Signs Assessment: post-procedure vital signs reviewed and stable Respiratory status: spontaneous breathing, respiratory function stable and nonlabored ventilation Cardiovascular status: stable Postop Assessment: no apparent nausea or vomiting Anesthetic complications: no   No complications documented.   Last Vitals:  Vitals:   09/09/20 1317  BP: 105/83  Pulse: (!) 111  Resp: (!) 21  Temp: 36.4 C  SpO2: 97%    Last Pain:  Vitals:   09/09/20 1428  TempSrc:   PainSc: 0-No pain                 GREGORY,SUZANNE

## 2020-09-09 NOTE — Transfer of Care (Signed)
Immediate Anesthesia Transfer of Care Note  Patient: Pam [name] [facility]  Procedure(s) Performed: COLONOSCOPY WITH PROPOFOL (N/A ) BIOPSY  Patient Location: PACU  Anesthesia Type:General  Level of Consciousness: awake, alert , oriented and patient cooperative  Airway & Oxygen Therapy: Patient Spontanous Breathing  Post-op Assessment: Report given to RN, Post -op Vital signs reviewed and stable and Patient moving all extremities X 4  Post vital signs: Reviewed and stable  Last Vitals:  Vitals Value Taken Time  BP    Temp    Pulse    Resp    SpO2      Last Pain:  Vitals:   09/09/20 1428  TempSrc:   PainSc: 0-No pain      Patients Stated Pain Goal: 7 (77/41/28 7867)  Complications: No complications documented.

## 2020-09-09 NOTE — Anesthesia Preprocedure Evaluation (Addendum)
Anesthesia Evaluation  Patient identified by MRN, date of birth, ID band Patient awake    Reviewed: Allergy & Precautions, NPO status , Patient's Chart, lab work & pertinent test results  History of Anesthesia Complications Negative for: history of anesthetic complications  Airway Mallampati: II  TM Distance: >3 FB Neck ROM: Full    Dental  (+) Lower Dentures, Upper Dentures   Pulmonary shortness of breath and with exertion, former smoker,    Pulmonary exam normal breath sounds clear to auscultation       Cardiovascular METS: 3 - Mets Normal cardiovascular exam+ dysrhythmias Atrial Fibrillation  Rhythm:Regular Rate:Normal     Neuro/Psych negative neurological ROS  negative psych ROS   GI/Hepatic negative GI ROS, Neg liver ROS,   Endo/Other  negative endocrine ROS  Renal/GU negative Renal ROS     Musculoskeletal  (+) Arthritis , Osteoarthritis,    Abdominal   Peds  Hematology negative hematology ROS (+)   Anesthesia Other Findings   Reproductive/Obstetrics                            Anesthesia Physical Anesthesia Plan  ASA: III  Anesthesia Plan: General   Post-op Pain Management:    Induction: Intravenous  PONV Risk Score and Plan: TIVA  Airway Management Planned: Nasal Cannula and Natural Airway  Additional Equipment:   Intra-op Plan:   Post-operative Plan:   Informed Consent: I have reviewed the patients History and Physical, chart, labs and discussed the procedure including the risks, benefits and alternatives for the proposed anesthesia with the patient or authorized representative who has indicated his/her understanding and acceptance.     Dental advisory given  Plan Discussed with: CRNA and Surgeon  Anesthesia Plan Comments:         Anesthesia Quick Evaluation

## 2020-09-09 NOTE — Discharge Instructions (Signed)
Colonoscopy Discharge Instructions  Read the instructions outlined below and refer to this sheet in the next few weeks. These discharge instructions provide you with general information on caring for yourself after you leave the hospital. Your doctor may also give you specific instructions. While your treatment has been planned according to the most current medical practices available, unavoidable complications occasionally occur. If you have any problems or questions after discharge, call Dr. Gala Romney at [phone]. ACTIVITY  You may resume your regular activity, but move at a slower pace for the next 24 hours.   Take frequent rest periods for the next 24 hours.   Walking will help get rid of the air and reduce the bloated feeling in your belly (abdomen).   No driving for 24 hours (because of the medicine (anesthesia) used during the test).    Do not sign any important legal documents or operate any machinery for 24 hours (because of the anesthesia used during the test).  NUTRITION  Drink plenty of fluids.   You may resume your normal diet as instructed by your doctor.   Begin with a light meal and progress to your normal diet. Heavy or fried foods are harder to digest and may make you feel sick to your stomach (nauseated).   Avoid alcoholic beverages for 24 hours or as instructed.  MEDICATIONS  You may resume your normal medications unless your doctor tells you otherwise.  WHAT YOU CAN EXPECT TODAY  Some feelings of bloating in the abdomen.   Passage of more gas than usual.   Spotting of blood in your stool or on the toilet paper.  IF YOU HAD POLYPS REMOVED DURING THE COLONOSCOPY:  No aspirin products for 7 days or as instructed.   No alcohol for 7 days or as instructed.   Eat a soft diet for the next 24 hours.  FINDING OUT THE RESULTS OF YOUR TEST Not all test results are available during your visit. If your test results are not back during the visit, make an appointment  with your caregiver to find out the results. Do not assume everything is normal if you have not heard from your caregiver or the medical facility. It is important for you to follow up on all of your test results.  SEEK IMMEDIATE MEDICAL ATTENTION IF:  You have more than a spotting of blood in your stool.   Your belly is swollen (abdominal distention).   You are nauseated or vomiting.   You have a temperature over 101.   You have abdominal pain or discomfort that is severe or gets worse throughout the day.    Diverticulosis only found today  Samples of your colon lining taken  Resume Lovenox this evening.  Continue twice daily until INR therapeutic  Resume Coumadin this evening  INR on 09/12/2020  Further recommendations to follow pending review of pathology report  At patient request, I called Pam [name] at [phone] number   Diverticulosis  Diverticulosis is a condition that develops when small pouches (diverticula) form in the wall of the large intestine (colon). The colon is where water is absorbed and stool (feces) is formed. The pouches form when the inside layer of the colon pushes through weak spots in the outer layers of the colon. You may have a few pouches or many of them. The pouches usually do not cause problems unless they become inflamed or infected. When this happens, the condition is called diverticulitis. What are the causes? The cause of this condition is not  known. What increases the risk? The following factors may make you more likely to develop this condition:  Being older than age 51. Your risk for this condition increases with age. Diverticulosis is rare among people younger than age 11. By age 55, many people have it.  Eating a low-fiber diet.  Having frequent constipation.  Being overweight.  Not getting enough exercise.  Smoking.  Taking over-the-counter pain medicines, like aspirin and ibuprofen.  Having a family history of  diverticulosis. What are the signs or symptoms? In most people, there are no symptoms of this condition. If you do have symptoms, they may include:  Bloating.  Cramps in the abdomen.  Constipation or diarrhea.  Pain in the lower left side of the abdomen. How is this diagnosed? Because diverticulosis usually has no symptoms, it is most often diagnosed during an exam for other colon problems. The condition may be diagnosed by:  Using a flexible scope to examine the colon (colonoscopy).  Taking an X-ray of the colon after dye has been put into the colon (barium enema).  Having a CT scan. How is this treated? You may not need treatment for this condition. Your health care provider may recommend treatment to prevent problems. You may need treatment if you have symptoms or if you previously had diverticulitis. Treatment may include:  Eating a high-fiber diet.  Taking a fiber supplement.  Taking a live bacteria supplement (probiotic).  Taking medicine to relax your colon.   Follow these instructions at home: Medicines  Take over-the-counter and prescription medicines only as told by your health care provider.  If told by your health care provider, take a fiber supplement or probiotic. Constipation prevention Your condition may cause constipation. To prevent or treat constipation, you may need to:  Drink enough fluid to keep your urine pale yellow.  Take over-the-counter or prescription medicines.  Eat foods that are high in fiber, such as beans, whole grains, and fresh fruits and vegetables.  Limit foods that are high in fat and processed sugars, such as fried or sweet foods.   General instructions  Try not to strain when you have a bowel movement.  Keep all follow-up visits as told by your health care provider. This is important. Contact a health care provider if you:  Have pain in your abdomen.  Have bloating.  Have cramps.  Have not had a bowel movement in 3  days. Get help right away if:  Your pain gets worse.  Your bloating becomes very bad.  You have a fever or chills, and your symptoms suddenly get worse.  You vomit.  You have bowel movements that are bloody or black.  You have bleeding from your rectum. Summary  Diverticulosis is a condition that develops when small pouches (diverticula) form in the wall of the large intestine (colon).  You may have a few pouches or many of them.  This condition is most often diagnosed during an exam for other colon problems.  Treatment may include increasing the fiber in your diet, taking supplements, or taking medicines. This information is not intended to replace advice given to you by your health care provider. Make sure you discuss any questions you have with your health care provider. Document Revised: 02/05/2019 Document Reviewed: 02/05/2019 Elsevier Patient Education  [place].

## 2020-09-09 NOTE — Op Note (Signed)
[facility] Patient Name: Pam [name] Procedure Date: 09/09/2020 2:18 PM MRN: [id number] Date of Birth: 1941/02/04 Attending MD: Norvel Richards , MD CSN: [id number] Age: 80 Admit Type: Outpatient Procedure:                Colonoscopy Indications:              Chronic diarrhea Providers:                Norvel Richards, MD, Rosina Lowenstein, RN, Nelma Rothman, Technician Referring MD:              Medicines:                Propofol per Anesthesia Complications:            No immediate complications. Estimated Blood Loss:     Estimated blood loss was minimal. Procedure:                Pre-Anesthesia Assessment:                           - Prior to the procedure, a History and Physical                            was performed, and patient medications and                            allergies were reviewed. The patient's tolerance of                            previous anesthesia was also reviewed. The risks                            and benefits of the procedure and the sedation                            options and risks were discussed with the patient.                            All questions were answered, and informed consent                            was obtained. Prior Anticoagulants: The patient                            last took Coumadin (warfarin) 3 days and Lovenox                            (enoxaparin) 1 day prior to the procedure. ASA                            Grade Assessment: III - A patient with severe  systemic disease. After reviewing the risks and                            benefits, the patient was deemed in satisfactory                            condition to undergo the procedure.                           After obtaining informed consent, the colonoscope                            was passed under direct vision. Throughout the                            procedure, the patient's blood pressure,  pulse, and                            oxygen saturations were monitored continuously. The                            CF-[id number] ([id number]) scope was introduced through                            the anus and advanced to the the cecum, identified                            by appendiceal orifice and ileocecal valve. The                            colonoscopy was performed without difficulty. The                            patient tolerated the procedure well. The quality                            of the bowel preparation was adequate. Scope In: 2:33:29 PM Scope Out: 6:31:49 PM Scope Withdrawal Time: 0 hours 7 minutes 28 seconds  Total Procedure Duration: 0 hours 13 minutes 19 seconds  Findings:      The perianal and digital rectal examinations were normal. Anal papilla       on flexion. Redundant colon. External abdominal pressure required to       reach the cecum.      Scattered small and large-mouthed diverticula were found in the entire       colon. Colonic mucosa otherwise appeared normal. Segmental biopsies       taken. Impression:               - Diverticulosis in the entire examined colon.                            Redundant colon                           Status post segmental biopsy. Moderate Sedation:  Moderate (conscious) sedation was personally administered by an       anesthesia professional. The following parameters were monitored: oxygen       saturation, heart rate, blood pressure, respiratory rate, EKG, adequacy       of pulmonary ventilation, and response to care. Recommendation:           - Patient has a contact number available for                            emergencies. The signs and symptoms of potential                            delayed complications were discussed with the                            patient. Return to normal activities tomorrow.                            Written discharge instructions were provided to the                             patient.                           - Advance diet as tolerated. Follow-up on                            pathology. Resume Lovenox this afternoon. Resume                            Coumadin this afternoon. INR on 09/12/2020. Patient                            to continue Lovenox until INR therapeutic.                           - No repeat colonoscopy. Procedure Code(s):        --- Professional ---                           [phone], Colonoscopy, flexible; diagnostic, including                            collection of specimen(s) by brushing or washing,                            when performed (separate procedure) Diagnosis Code(s):        --- Professional ---                           K52.9, Noninfective gastroenteritis and colitis,                            unspecified  K57.30, Diverticulosis of large intestine without                            perforation or abscess without bleeding CPT copyright 2019 American Medical Association. All rights reserved. The codes documented in this report are preliminary and upon coder review may  be revised to meet current compliance requirements. Cristopher Estimable. Daxen Lanum, MD Norvel Richards, MD 09/09/2020 2:54:58 PM This report has been signed electronically. Number of Addenda: 0

## 2020-09-12 DIAGNOSIS — I480 Paroxysmal atrial fibrillation: Secondary | ICD-10-CM | POA: Diagnosis not present

## 2020-09-13 LAB — SURGICAL PATHOLOGY

## 2020-09-14 ENCOUNTER — Encounter: Payer: Self-pay | Admitting: Internal Medicine

## 2020-09-14 ENCOUNTER — Encounter (HOSPITAL_COMMUNITY): Payer: Self-pay | Admitting: Internal Medicine

## 2020-09-20 DIAGNOSIS — Z7901 Long term (current) use of anticoagulants: Secondary | ICD-10-CM | POA: Diagnosis not present

## 2020-09-20 DIAGNOSIS — I4891 Unspecified atrial fibrillation: Secondary | ICD-10-CM | POA: Diagnosis not present

## 2020-09-21 DIAGNOSIS — M7050 Other bursitis of knee, unspecified knee: Secondary | ICD-10-CM | POA: Diagnosis not present

## 2020-09-21 DIAGNOSIS — I480 Paroxysmal atrial fibrillation: Secondary | ICD-10-CM | POA: Diagnosis not present

## 2020-09-21 DIAGNOSIS — I10 Essential (primary) hypertension: Secondary | ICD-10-CM | POA: Diagnosis not present

## 2020-09-27 DIAGNOSIS — I480 Paroxysmal atrial fibrillation: Secondary | ICD-10-CM | POA: Diagnosis not present

## 2020-10-21 DIAGNOSIS — I480 Paroxysmal atrial fibrillation: Secondary | ICD-10-CM | POA: Diagnosis not present

## 2020-10-26 ENCOUNTER — Ambulatory Visit: Payer: Medicare Other | Admitting: Nurse Practitioner

## 2020-10-31 ENCOUNTER — Encounter: Payer: Self-pay | Admitting: Internal Medicine

## 2020-11-11 DIAGNOSIS — I480 Paroxysmal atrial fibrillation: Secondary | ICD-10-CM | POA: Diagnosis not present

## 2020-11-29 ENCOUNTER — Ambulatory Visit: Payer: Medicare Other | Admitting: Nurse Practitioner

## 2020-12-01 ENCOUNTER — Other Ambulatory Visit: Payer: Self-pay | Admitting: Cardiology

## 2020-12-09 DIAGNOSIS — I480 Paroxysmal atrial fibrillation: Secondary | ICD-10-CM | POA: Diagnosis not present

## 2020-12-20 ENCOUNTER — Other Ambulatory Visit: Payer: Self-pay

## 2020-12-20 ENCOUNTER — Encounter: Payer: Self-pay | Admitting: Internal Medicine

## 2020-12-20 ENCOUNTER — Ambulatory Visit (INDEPENDENT_AMBULATORY_CARE_PROVIDER_SITE_OTHER): Payer: Medicare Other | Admitting: Internal Medicine

## 2020-12-20 VITALS — BP 138/78 | HR 79 | Temp 97.1°F | Ht 63.0 in | Wt 142.8 lb

## 2020-12-20 DIAGNOSIS — K529 Noninfective gastroenteritis and colitis, unspecified: Secondary | ICD-10-CM | POA: Diagnosis not present

## 2020-12-20 NOTE — Patient Instructions (Signed)
It was good to see you again today.  Since her GI symptoms have resolved, no further evaluation is needed  No scheduled follow-up in this office at this time.  However, if any future issues arise please give me a call.

## 2020-12-20 NOTE — Progress Notes (Signed)
Primary Care Physician:  Neale Burly, MD Primary Gastroenterologist:  Dr. Gala Romney  Pre-Procedure History & Physical: HPI:  Pam [name] is a 80 y.o. female here for follow-up of an episode of probable right-sided infectious colitis with secondary nonbloody diarrhea.  Diarrhea resolved.  She underwent a colonoscopy with segmental biopsies.  No evidence of colitis (no macroscopic or microscopic).  Diverticulosis only.  Clinically she is doing well now.  No GI symptoms.  She continues to be chronically anticoagulated for atrial fibrillation with Coumadin.  Past Medical History:  Diagnosis Date  . Arthritis   . Atrial fibrillation ([place])   . Cancer [facility])    BREAST CANCER / CERVICAL CANCER   . Cervical cancer ([place])   . Colitis   . Dysrhythmia   . History of breast cancer 2006   Dr. Sonny Dandy oncologist - left breast  . History of gout   . Hyperlipidemia   . Self-catheterizes urinary bladder   . Urinary retention Sept 2016   s/p hysterectomy    Past Surgical History:  Procedure Laterality Date  . BIOPSY  09/09/2020   Procedure: BIOPSY;  Surgeon: Daneil Dolin, MD;  [facility];  Service: Endoscopy;;  . BREAST SURGERY    . CARDIOVERSION N/A 06/03/2019   Procedure: CARDIOVERSION;  Surgeon: Jerline Pain, MD;  Location: [facility] ENDOSCOPY;  Service: Cardiovascular;  Laterality: N/A;  . CATARACT EXTRACTION W/ INTRAOCULAR LENS  IMPLANT, BILATERAL    . COLONOSCOPY WITH PROPOFOL N/A 09/09/2020   Procedure: COLONOSCOPY WITH PROPOFOL;  Surgeon: Daneil Dolin, MD;  [facility];  Service: Endoscopy;  Laterality: N/A;  2:45pm  . EYE SURGERY    . INGUINAL HERNIA REPAIR Right 12/14/2015   Procedure: LAPAROSCOPIC AND OPEN RIGHT  INGUINAL HERNIA;  Surgeon: Johnathan Hausen, MD;  [facility];  Service: General;  Laterality: Right;  . KNEE ARTHROSCOPY Left   . MASTECTOMY Left 2006  . MASTECTOMY WITH AXILLARY LYMPH NODE DISSECTION Left   . ROBOTIC ASSISTED TOTAL  HYSTERECTOMY WITH BILATERAL SALPINGO OOPHERECTOMY Bilateral 04/07/2015   Procedure: ROBOTIC ASSISTED RADICAL HYSTERECTOMY BILATERAL SALPINGO OOPHORECTOMY BILATERAL SENTINEL LYMPHADENETOMY;  Surgeon: Everitt Amber, MD;  [facility];  Service: Gynecology;  Laterality: Bilateral;  . TOTAL KNEE ARTHROPLASTY Left 07/13/2019   Procedure: LEFT TOTAL KNEE ARTHROPLASTY-CEMENTED;  Surgeon: Marybelle Killings, MD;  Location: [place];  Service: Orthopedics;  Laterality: Left;  . TOTAL KNEE ARTHROPLASTY Right 02/15/2020   Procedure: RIGHT TOTAL KNEE ARTHROPLASTY;  Surgeon: Marybelle Killings, MD;  Location: [place];  Service: Orthopedics;  Laterality: Right;  . TUBAL LIGATION      Prior to Admission medications   Medication Sig Start Date End Date Taking? Authorizing Provider  bisacodyl (DULCOLAX) 5 MG EC tablet Take 5 mg by mouth daily as needed for mild constipation.     [provider]  diltiazem (CARDIZEM CD) 360 MG 24 hr capsule Take 1 capsule by mouth once daily 12/02/20   Minus Breeding, MD  enoxaparin (LOVENOX) 60 MG/0.6ML injection Inject 0.6 mLs (60 mg total) into the skin every 12 (twelve) hours. 07/12/20   Carlis Stable, NP  fexofenadine (ALLEGRA) 180 MG tablet Take 180 mg by mouth daily.    [provider]  gabapentin (NEURONTIN) 300 MG capsule Take 300 mg by mouth at bedtime. 08/15/20   [provider]  metoprolol tartrate (LOPRESSOR) 25 MG tablet Take 1 tablet (25 mg total) by mouth 2 (two) times daily. 06/02/20 05/28/21  Minus Breeding, MD  rOPINIRole (REQUIP) 1 MG tablet Take 1 mg by mouth at bedtime. 04/05/17   [provider]  traMADol (ULTRAM) 50 MG tablet Take 50 mg by mouth every 12 (twelve) hours as needed (pain).    [provider]  warfarin (COUMADIN) 5 MG tablet Take 5 mg by mouth every other day. In the morning (alternates with 7.5 mg tablet)    [provider]  warfarin (COUMADIN) 7.5 MG tablet Take 7.5 mg by mouth every other day. In the  morning (alternates with 5 mg tablet)    [provider]    Allergies as of 12/20/2020 - Review Complete 12/20/2020  Allergen Reaction Noted  . Aspirin Nausea And Vomiting 03/23/2015  . Ciprofloxacin Diarrhea 04/03/2014  . Flagyl [metronidazole] Diarrhea 04/03/2014  . Penicillins Hives 04/03/2014    Family History  Problem Relation Age of Onset  . Prostate cancer Father   . Heart failure Father        46s  . Heart failure Mother        60  . Breast cancer Sister   . Colon polyps Sister        early 70s   . Colon cancer Neg Hx     Social History   Socioeconomic History  . Marital status: Married    Spouse name: Not on file  . Number of children: 1  . Years of education: Not on file  . Highest education level: Not on file  Occupational History  . Not on file  Tobacco Use  . Smoking status: Former Smoker    Packs/day: 1.00    Years: 30.00    Pack years: 30.00    Types: Cigarettes    Quit date: 07/23/1994    Years since quitting: 26.4  . Smokeless tobacco: Never Used  Vaping Use  . Vaping Use: Never used  Substance and Sexual Activity  . Alcohol use: Never    Alcohol/week: 0.0 standard drinks  . Drug use: Never  . Sexual activity: Not Currently  Other Topics Concern  . Not on file  Social History Narrative   One daughter.  Lives with husband.     Social Determinants of Health   Financial Resource Strain: Not on file  Food Insecurity: Not on file  Transportation Needs: Not on file  Physical Activity: Not on file  Stress: Not on file  Social Connections: Not on file  Intimate Partner Violence: Not on file    Review of Systems: See HPI, otherwise negative ROS  Physical Exam: BP 138/78   Pulse 79   Temp (!) 97.1 F (36.2 C) (Temporal)   Ht 5\' 3"  (1.6 m)   Wt 142 lb 12.8 oz (64.8 kg)   BMI 25.30 kg/m  General:   Alert, elderly pleasant and cooperative in NAD Neck:  Supple; no masses or thyromegaly. No significant cervical  adenopathy. Lungs:  Clear throughout to auscultation.   No wheezes, crackles, or rhonchi. No acute distress. Heart:  Regular rate and rhythm; no murmurs, clicks, rubs,  or gallops. Abdomen: Non-distended, normal bowel sounds.  Soft and nontender without appreciable mass or hepatosplenomegaly.  Pulses:  Normal pulses noted. Extremities:  Without clubbing or edema.  Impression/Plan: 80 year old lady with recent colitis felt to be infectious in etiology.  Follow-up colonoscopy reassuring.  She is essentially devoid of any GI tract symptoms at this time. No further GI evaluation warranted.  Recommendations:  Follow-up with Dr. Blanch Media as scheduled.  No future follow-up scheduled  here.  However, if any issues arise, she is urged to reach out.     Notice: This dictation was prepared with Dragon dictation along with smaller phrase technology. Any transcriptional errors that result from this process are unintentional and may not be corrected upon review.

## 2020-12-22 DIAGNOSIS — M7050 Other bursitis of knee, unspecified knee: Secondary | ICD-10-CM | POA: Diagnosis not present

## 2020-12-22 DIAGNOSIS — I10 Essential (primary) hypertension: Secondary | ICD-10-CM | POA: Diagnosis not present

## 2020-12-22 DIAGNOSIS — I480 Paroxysmal atrial fibrillation: Secondary | ICD-10-CM | POA: Diagnosis not present

## 2020-12-26 ENCOUNTER — Other Ambulatory Visit: Payer: Self-pay | Admitting: Cardiology

## 2020-12-30 DIAGNOSIS — I480 Paroxysmal atrial fibrillation: Secondary | ICD-10-CM | POA: Diagnosis not present

## 2021-01-14 ENCOUNTER — Emergency Department (HOSPITAL_COMMUNITY)
Admission: EM | Admit: 2021-01-14 | Discharge: 2021-01-14 | Disposition: A | Discharge status: Home / Self Care | Payer: Medicare Other | Attending: Emergency Medicine | Admitting: Emergency Medicine

## 2021-01-14 ENCOUNTER — Other Ambulatory Visit: Payer: Self-pay

## 2021-01-14 ENCOUNTER — Encounter (HOSPITAL_COMMUNITY): Payer: Self-pay

## 2021-01-14 DIAGNOSIS — Z96653 Presence of artificial knee joint, bilateral: Secondary | ICD-10-CM | POA: Insufficient documentation

## 2021-01-14 DIAGNOSIS — R300 Dysuria: Secondary | ICD-10-CM | POA: Diagnosis present

## 2021-01-14 DIAGNOSIS — E860 Dehydration: Secondary | ICD-10-CM | POA: Diagnosis not present

## 2021-01-14 DIAGNOSIS — N3001 Acute cystitis with hematuria: Secondary | ICD-10-CM

## 2021-01-14 DIAGNOSIS — Z87891 Personal history of nicotine dependence: Secondary | ICD-10-CM | POA: Diagnosis not present

## 2021-01-14 DIAGNOSIS — Z8541 Personal history of malignant neoplasm of cervix uteri: Secondary | ICD-10-CM | POA: Diagnosis not present

## 2021-01-14 DIAGNOSIS — Z8616 Personal history of COVID-19: Secondary | ICD-10-CM | POA: Diagnosis not present

## 2021-01-14 DIAGNOSIS — Z853 Personal history of malignant neoplasm of breast: Secondary | ICD-10-CM | POA: Diagnosis not present

## 2021-01-14 DIAGNOSIS — Z7901 Long term (current) use of anticoagulants: Secondary | ICD-10-CM | POA: Insufficient documentation

## 2021-01-14 LAB — URINALYSIS, ROUTINE W REFLEX MICROSCOPIC
Bilirubin Urine: NEGATIVE
Glucose, UA: NEGATIVE mg/dL
Ketones, ur: NEGATIVE mg/dL
Nitrite: NEGATIVE
Protein, ur: 30 mg/dL — AB
Specific Gravity, Urine: 1.011 (ref 1.005–1.030)
WBC, UA: >50 WBC/hpf — ABNORMAL HIGH (ref 0–5)
pH: 5 (ref 5.0–8.0)

## 2021-01-14 LAB — I-STAT CHEM 8, ED
BUN: 21 mg/dL (ref 8–23)
Calcium, Ion: 1.14 mmol/L — ABNORMAL LOW (ref 1.15–1.40)
Chloride: 102 mmol/L (ref 98–111)
Creatinine, Ser: 1.1 mg/dL — ABNORMAL HIGH (ref 0.44–1.00)
Glucose, Bld: 143 mg/dL — ABNORMAL HIGH (ref 70–99)
HCT: 38 % (ref 36.0–46.0)
Hemoglobin: 12.9 g/dL (ref 12.0–15.0)
Potassium: 4.1 mmol/L (ref 3.5–5.1)
Sodium: 138 mmol/L (ref 135–145)
TCO2: 25 mmol/L (ref 22–32)

## 2021-01-14 MED ORDER — NITROFURANTOIN MONOHYD MACRO 100 MG PO CAPS
100.0000 mg | ORAL_CAPSULE | Freq: Once | ORAL | Status: AC
  Administered 2021-01-14: 23:00:00 100 mg via ORAL
  Filled 2021-01-14: qty 1

## 2021-01-14 MED ORDER — NITROFURANTOIN MONOHYD MACRO 100 MG PO CAPS: 100.0000 mg | ORAL_CAPSULE | Freq: Two times a day (BID) | ORAL | 0 refills | Status: DC

## 2021-01-14 MED ORDER — SULFAMETHOXAZOLE-TRIMETHOPRIM 800-160 MG PO TABS: 1.0000 | ORAL_TABLET | Freq: Once | ORAL | Status: DC

## 2021-01-14 NOTE — Discharge Instructions (Addendum)
Drink plenty of fluids and follow-up with your doctor next week for recheck °

## 2021-01-14 NOTE — ED Provider Notes (Signed)
[place] DEPT Provider Note   CSN: [id number] Arrival date & time: 01/14/21  2027     History No chief complaint on file.   Pam [name] is a 80 y.o. female.  Patient states she caths herself frequently to urinate and she cannot find any urine in there now  The history is provided by the patient. No language interpreter was used.  Dysuria Pain quality:  Aching Pain severity:  Mild Onset quality:  Sudden Timing:  Constant Progression:  Waxing and waning Chronicity:  New Relieved by:  Nothing Worsened by:  Nothing Ineffective treatments:  Acetaminophen Associated symptoms: no abdominal pain       Past Medical History:  Diagnosis Date   Arthritis    Atrial fibrillation ([place])    Cancer ([place])    BREAST CANCER / CERVICAL CANCER    Cervical cancer ([place])    Colitis    Dysrhythmia    History of breast cancer 2006   Dr. Sonny Dandy oncologist - left breast   History of gout    Hyperlipidemia    Self-catheterizes urinary bladder    Urinary retention Sept 2016   s/p hysterectomy    Patient Active Problem List   Diagnosis Date Noted   Chronic atrial fibrillation ([place]) 08/16/2020   Abnormal CT of the abdomen 07/06/2020   Hx of total knee arthroplasty, right 04/07/2020   Diarrhea 12/30/2019   Acute abdominal pain 12/14/2019   Acute lower UTI 12/14/2019   Colitis 12/14/2019   S/P total knee arthroplasty, 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   Educated about COVID-19 virus infection 07/27/2019   Persistent atrial fibrillation ([place]) 06/07/2019   PAF (paroxysmal atrial fibrillation) ([place])    Candidiasis, vagina 01/02/2019   Hernia of abdominal cavity 10/07/2015   Overflow incontinence of urine 07/04/2015   Urinary retention with incomplete bladder emptying 05/09/2015   Cervical cancer ([place]) 03/23/2015    Past Surgical History:  Procedure Laterality Date   BIOPSY  09/09/2020   Procedure: BIOPSY;  Surgeon: Daneil Dolin, MD;  [facility];   Service: Endoscopy;;   BREAST SURGERY     CARDIOVERSION N/A 06/03/2019   Procedure: CARDIOVERSION;  Surgeon: Jerline Pain, MD;  Location: [place] ENDOSCOPY;  Service: Cardiovascular;  Laterality: N/A;   CATARACT EXTRACTION W/ INTRAOCULAR LENS  IMPLANT, BILATERAL     COLONOSCOPY WITH PROPOFOL N/A 09/09/2020   Procedure: COLONOSCOPY WITH PROPOFOL;  Surgeon: Daneil Dolin, MD;  [facility];  Service: Endoscopy;  Laterality: N/A;  2:45pm   EYE SURGERY     INGUINAL HERNIA REPAIR Right 12/14/2015   Procedure: LAPAROSCOPIC AND OPEN RIGHT  INGUINAL HERNIA;  Surgeon: Johnathan Hausen, MD;  [facility];  Service: General;  Laterality: Right;   KNEE ARTHROSCOPY Left    MASTECTOMY Left 2006   MASTECTOMY WITH AXILLARY LYMPH NODE DISSECTION Left    ROBOTIC ASSISTED TOTAL HYSTERECTOMY WITH BILATERAL SALPINGO OOPHERECTOMY Bilateral 04/07/2015   Procedure: ROBOTIC ASSISTED RADICAL HYSTERECTOMY BILATERAL SALPINGO OOPHORECTOMY BILATERAL SENTINEL LYMPHADENETOMY;  Surgeon: Everitt Amber, MD;  [facility];  Service: Gynecology;  Laterality: Bilateral;   TOTAL KNEE ARTHROPLASTY Left 07/13/2019   Procedure: LEFT TOTAL KNEE ARTHROPLASTY-CEMENTED;  Surgeon: Marybelle Killings, MD;  Location: [place];  Service: Orthopedics;  Laterality: Left;   TOTAL KNEE ARTHROPLASTY Right 02/15/2020   Procedure: RIGHT TOTAL KNEE ARTHROPLASTY;  Surgeon: Marybelle Killings, MD;  Location: [place];  Service: Orthopedics;  Laterality: Right;   TUBAL LIGATION  OB History   No obstetric history on file.     Family History  Problem Relation Age of Onset   Prostate cancer Father    Heart failure Father        4s   Heart failure Mother        29   Breast cancer Sister    Colon polyps Sister        early 59s    Colon cancer Neg Hx     Social History   Tobacco Use   Smoking status: Former    Packs/day: 1.00    Years: 30.00    Pack years: 30.00    Types: Cigarettes    Quit date: 07/23/1994    Years since quitting: 26.4    Smokeless tobacco: Never  Vaping Use   Vaping Use: Never used  Substance Use Topics   Alcohol use: Never    Alcohol/week: 0.0 standard drinks   Drug use: Never    Home Medications Prior to Admission medications   Medication Sig Start Date End Date Taking? Authorizing Provider  nitrofurantoin, macrocrystal-monohydrate, (MACROBID) 100 MG capsule Take 1 capsule (100 mg total) by mouth 2 (two) times daily. 01/14/21  Yes Milton Ferguson, MD  bisacodyl (DULCOLAX) 5 MG EC tablet Take 5 mg by mouth daily as needed for mild constipation.     [provider]  diltiazem (CARDIZEM CD) 360 MG 24 hr capsule Take 1 capsule by mouth once daily 12/02/20   Minus Breeding, MD  fexofenadine (ALLEGRA) 180 MG tablet Take 180 mg by mouth daily.    [provider]  gabapentin (NEURONTIN) 300 MG capsule Take 300 mg by mouth at bedtime. 08/15/20   [provider]  metoprolol tartrate (LOPRESSOR) 25 MG tablet Take 1 tablet by mouth twice daily 12/26/20   Minus Breeding, MD  rOPINIRole (REQUIP) 1 MG tablet Take 1 mg by mouth at bedtime. 04/05/17   [provider]  traMADol (ULTRAM) 50 MG tablet Take 50 mg by mouth every 12 (twelve) hours as needed (pain).    [provider]  warfarin (COUMADIN) 5 MG tablet Take 5 mg by mouth every other day. In the morning (alternates with 7.5 mg tablet)    [provider]  warfarin (COUMADIN) 7.5 MG tablet Take 7.5 mg by mouth every other day. In the morning (alternates with 5 mg tablet)    [provider]    Allergies    Aspirin, Ciprofloxacin, Flagyl [metronidazole], and Penicillins  Review of Systems   Review of Systems  Constitutional:  Negative for appetite change and fatigue.  HENT:  Negative for congestion, ear discharge and sinus pressure.   Eyes:  Negative for discharge.  Respiratory:  Negative for cough.   Cardiovascular:  Negative for chest pain.  Gastrointestinal:  Negative for abdominal pain and  diarrhea.  Genitourinary:  Positive for dysuria. Negative for frequency and hematuria.       Decreased urination  Musculoskeletal:  Negative for back pain.  Skin:  Negative for rash.  Neurological:  Negative for seizures and headaches.  Psychiatric/Behavioral:  Negative for hallucinations.    Physical Exam Updated Vital Signs BP (!) 113/59   Pulse 67   Temp 97.7 F (36.5 C) (Oral)   Resp 18   Wt 63.5 kg   SpO2 96%   BMI 24.80 kg/m   Physical Exam Vitals and nursing note reviewed.  Constitutional:      Appearance: She is well-developed.  HENT:     Head:  Normocephalic.     Nose: Nose normal.  Eyes:     General: No scleral icterus.    Conjunctiva/sclera: Conjunctivae normal.  Neck:     Thyroid: No thyromegaly.  Cardiovascular:     Rate and Rhythm: Normal rate and regular rhythm.     Heart sounds: No murmur heard.   No friction rub. No gallop.  Pulmonary:     Breath sounds: No stridor. No wheezing or rales.  Chest:     Chest wall: No tenderness.  Abdominal:     General: There is no distension.     Tenderness: There is no abdominal tenderness. There is no rebound.  Musculoskeletal:        General: Normal range of motion.     Cervical back: Neck supple.  Lymphadenopathy:     Cervical: No cervical adenopathy.  Skin:    Findings: No erythema or rash.  Neurological:     Mental Status: She is alert and oriented to person, place, and time.     Motor: No abnormal muscle tone.     Coordination: Coordination normal.  Psychiatric:        Behavior: Behavior normal.    ED Results / Procedures / Treatments   Labs (all labs ordered are listed, but only abnormal results are displayed) Labs Reviewed  URINALYSIS, ROUTINE W REFLEX MICROSCOPIC - Abnormal; Notable for the following components:      Result Value   APPearance CLOUDY (*)    Hgb urine dipstick SMALL (*)    Protein, ur 30 (*)    Leukocytes,Ua LARGE (*)    WBC, UA >50 (*)    Bacteria, UA MANY (*)    All other  components within normal limits  I-STAT CHEM 8, ED - Abnormal; Notable for the following components:   Creatinine, Ser 1.10 (*)    Glucose, Bld 143 (*)    Calcium, Ion 1.14 (*)    All other components within normal limits  URINE CULTURE    EKG None  Radiology No results found.  Procedures Procedures   Medications Ordered in ED Medications  nitrofurantoin (macrocrystal-monohydrate) (MACROBID) capsule 100 mg (has no administration in time range)    ED Course  I have reviewed the triage vital signs and the nursing notes.  Pertinent labs & imaging results that were available during my care of the patient were reviewed by me and considered in my medical decision making (see chart for details).    MDM Rules/Calculators/A&P                         Patient with UTI and dehydration.  She is placed on Macrobid and told to drink more fluids and follow-up with her PCP Final Clinical Impression(s) / ED Diagnoses Final diagnoses:  Acute cystitis with hematuria    Rx / DC Orders ED Discharge Orders          Ordered    nitrofurantoin, macrocrystal-monohydrate, (MACROBID) 100 MG capsule  2 times daily        01/14/21 2242             Milton Ferguson, MD 01/16/21 [phone]

## 2021-01-14 NOTE — ED Notes (Signed)
Pt has extra tubes in lab if needed

## 2021-01-14 NOTE — ED Notes (Addendum)
30-50 ml showed in bladder scan.

## 2021-01-14 NOTE — ED Provider Notes (Signed)
MSE note.  Patient states she does self cath.  She has been cathing herself without getting any urine back.  Patient does not complain of any pain or weakness or dizziness.  Physical exam patient alert in no acute distress.  Labs ordered   Milton Ferguson, MD 01/14/21 2054

## 2021-01-14 NOTE — ED Triage Notes (Signed)
Pt comes from home, states that she is unable to effectively relieve her bladder when self catheterizing. Pt has been using catheters since 2016. Last time pt was able to relieve her bladder was 2pm today. Complains of minor pain to lower abdomen.

## 2021-01-15 NOTE — Progress Notes (Signed)
Cardiology Office Note   Date:  01/17/2021   ID:  Pam [name], Pam [name] 05-25-1941, MRN [id number]  PCP:  Neale Burly, MD  Cardiologist:   Minus Breeding, MD  Chief Complaint  Patient presents with   Shortness of Breath       History of Present Illness: Pam [name] is a 80 y.o. female who was referred by Neale Burly, MD for evaluation of atrial fib.   After I last saw her I ordered an echo which was unremarkable. She had cardioversion but only stayed in sinus rhythm for about two days.   She went back into atrial fib.  She is now being managed with rate control.    Since I last saw her she has had increasing shortness of breath.  She gets short of breath walking a short distance on level ground such as 25 yards to her mailbox or going shopping.  She is not describing any resting shortness of breath, PND or orthopnea.  She is not having any palpitations, presyncope or syncope and does not really notice her atrial fibrillation.  Says her shortness of breath has been getting progressive.  She does not report any cough fevers or chills.  She does not have any associated nausea vomiting or diaphoresis.  She had chronic intermittent left leg swelling.  States anticoagulation says its been well controlled.  She did have 1 episode of chest tightness lasting about 15 minutes yesterday but otherwise does not report chest pressure, neck or arm discomfort.   Past Medical History:  Diagnosis Date   Arthritis    Atrial fibrillation ([place])    Cancer [facility])    BREAST CANCER / CERVICAL CANCER    Cervical cancer ([place])    Colitis    Dysrhythmia    History of breast cancer 2006   Dr. Sonny Dandy oncologist - left breast   History of gout    Hyperlipidemia    Self-catheterizes urinary bladder    Urinary retention Sept 2016   s/p hysterectomy    Past Surgical History:  Procedure Laterality Date   BIOPSY  09/09/2020   Procedure: BIOPSY;  Surgeon: Daneil Dolin, MD;  [facility];  Service: Endoscopy;;   BREAST SURGERY     CARDIOVERSION N/A 06/03/2019   Procedure: CARDIOVERSION;  Surgeon: Jerline Pain, MD;  Location: [place];  Service: Cardiovascular;  Laterality: N/A;   CATARACT EXTRACTION W/ INTRAOCULAR LENS  IMPLANT, BILATERAL     COLONOSCOPY WITH PROPOFOL N/A 09/09/2020   Procedure: COLONOSCOPY WITH PROPOFOL;  Surgeon: Daneil Dolin, MD;  [facility];  Service: Endoscopy;  Laterality: N/A;  2:45pm   EYE SURGERY     INGUINAL HERNIA REPAIR Right 12/14/2015   Procedure: LAPAROSCOPIC AND OPEN RIGHT  INGUINAL HERNIA;  Surgeon: Johnathan Hausen, MD;  [facility];  Service: General;  Laterality: Right;   KNEE ARTHROSCOPY Left    MASTECTOMY Left 2006   MASTECTOMY WITH AXILLARY LYMPH NODE DISSECTION Left    ROBOTIC ASSISTED TOTAL HYSTERECTOMY WITH BILATERAL SALPINGO OOPHERECTOMY Bilateral 04/07/2015   Procedure: ROBOTIC ASSISTED RADICAL HYSTERECTOMY BILATERAL SALPINGO OOPHORECTOMY BILATERAL SENTINEL LYMPHADENETOMY;  Surgeon: Everitt Amber, MD;  [facility];  Service: Gynecology;  Laterality: Bilateral;   TOTAL KNEE ARTHROPLASTY Left 07/13/2019   Procedure: LEFT TOTAL KNEE ARTHROPLASTY-CEMENTED;  Surgeon: Marybelle Killings, MD;  Location: [place];  Service: Orthopedics;  Laterality: Left;   TOTAL KNEE ARTHROPLASTY Right 02/15/2020   Procedure: RIGHT TOTAL KNEE  ARTHROPLASTY;  Surgeon: Marybelle Killings, MD;  Location: [place];  Service: Orthopedics;  Laterality: Right;   TUBAL LIGATION       Current Outpatient Medications  Medication Sig Dispense Refill   bisacodyl (DULCOLAX) 5 MG EC tablet Take 5 mg by mouth daily as needed for mild constipation.      diltiazem (TIAZAC) 360 MG 24 hr capsule Take 360 mg by mouth daily.     fexofenadine (ALLEGRA) 180 MG tablet Take 180 mg by mouth daily.     gabapentin (NEURONTIN) 100 MG capsule Take by mouth.     metoprolol tartrate (LOPRESSOR) 25 MG tablet Take 1 tablet by mouth twice daily 180 tablet 1    nitrofurantoin, macrocrystal-monohydrate, (MACROBID) 100 MG capsule Take 1 capsule (100 mg total) by mouth 2 (two) times daily. 14 capsule 0   rOPINIRole (REQUIP) 1 MG tablet Take 1 mg by mouth at bedtime.  0   traMADol (ULTRAM) 50 MG tablet Take 50 mg by mouth every 12 (twelve) hours as needed (pain).     warfarin (COUMADIN) 5 MG tablet Take 5 mg by mouth every other day. In the morning (alternates with 7.5 mg tablet)     warfarin (COUMADIN) 7.5 MG tablet Take 7.5 mg by mouth every other day. In the morning (alternates with 5 mg tablet)     No current facility-administered medications for this visit.    Allergies:   Aspirin, Ciprofloxacin, Flagyl [metronidazole], and Penicillins    ROS:  Please see the history of present illness.   Otherwise, review of systems are positive for none.   All other systems are reviewed and negative.    PHYSICAL EXAM: VS:  BP 122/80   Pulse (!) 57   Ht 5\' 3"  (1.6 m)   Wt 140 lb (63.5 kg)   SpO2 95%   BMI 24.80 kg/m  , BMI Body mass index is 24.8 kg/m. GENERAL:  Well appearing NECK:  No jugular venous distention, waveform within normal limits, carotid upstroke brisk and symmetric, no bruits, no thyromegaly LUNGS:  Clear to auscultation bilaterally CHEST:  Unremarkable HEART:  PMI not displaced or sustained,S1 and S2 within normal limits, no S3, no clicks, no rubs, no murmurs, irregular ABD:  Flat, positive bowel sounds normal in frequency in pitch, no bruits, no rebound, no guarding, no midline pulsatile mass, no hepatomegaly, no splenomegaly EXT:  2 plus pulses throughout, no edema, no cyanosis no clubbing  EKG:  EKG is  ordered today. Atrial fibrillation, rate 57, axis within normal limits, intervals within normal limits, no acute ST-T wave changes.  Recent Labs: 02/11/2020: ALT <5 02/16/2020: Platelets 279 01/14/2021: BUN 21; Creatinine, Ser 1.10; Hemoglobin 12.9; Potassium 4.1; Sodium 138    Lipid Panel No results found for: CHOL, TRIG, HDL,  CHOLHDL, VLDL, LDLCALC, LDLDIRECT    Wt Readings from Last 3 Encounters:  01/17/21 140 lb (63.5 kg)  01/14/21 140 lb (63.5 kg)  12/20/20 142 lb 12.8 oz (64.8 kg)      Other studies Reviewed: Additional studies/ records that were reviewed today include: Labs. Review of the above records demonstrates:  Please see elsewhere in the note.     ASSESSMENT AND PLAN:  ATRIAL FIB:   Pam [name] has a CHA2DS2 - VASc score of 3.    I do not think this is related to her shortness of breath.  She had good rate control.  She tolerates anticoagulation.  She did not maintain sinus rhythm and our plan was  rate control and anticoagulation rather than rhythm control.  I will check a BNP and an echocardiogram today.  I will check a CBC and a TSH.  Further evaluation will be based on these results.  SOB: This will be evaluated as above.  I might consider pulmonary function testing if this worsens and the work-up is otherwise negative.  LEG SWELLING: She has had chronic anticoagulation but with this lower extremity swelling rule out deep venous thrombosis on the left.    Current medicines are reviewed at length with the patient today.  The patient does not have concerns regarding medicines.  The following changes have been made:  None  Labs/ tests ordered today include: None  Orders Placed This Encounter  Procedures   US Venous Img Lower Unilateral Left (DVT)   CBC   Pro b natriuretic peptide (BNP)[facility])   TSH   ECHOCARDIOGRAM COMPLETE   VAS [place] LOWER EXTREMITY VENOUS (DVT)      Disposition:   FU with me August in [place].   Signed, Minus Breeding, MD  01/17/2021 4:21 PM     [facility]

## 2021-01-17 ENCOUNTER — Encounter: Payer: Self-pay | Admitting: Cardiology

## 2021-01-17 ENCOUNTER — Ambulatory Visit (INDEPENDENT_AMBULATORY_CARE_PROVIDER_SITE_OTHER): Payer: Medicare Other | Admitting: Cardiology

## 2021-01-17 ENCOUNTER — Other Ambulatory Visit: Payer: Self-pay

## 2021-01-17 VITALS — BP 122/80 | HR 57 | Ht 63.0 in | Wt 140.0 lb

## 2021-01-17 DIAGNOSIS — R600 Localized edema: Secondary | ICD-10-CM | POA: Diagnosis not present

## 2021-01-17 DIAGNOSIS — R0602 Shortness of breath: Secondary | ICD-10-CM

## 2021-01-17 DIAGNOSIS — I4891 Unspecified atrial fibrillation: Secondary | ICD-10-CM

## 2021-01-17 LAB — URINE CULTURE: Culture: >=100000 — AB

## 2021-01-17 NOTE — Patient Instructions (Signed)
  Testing/Procedures:  Your physician has requested that you have an echocardiogram. Echocardiography is a painless test that uses sound waves to create images of your heart. It provides your doctor with information about the size and shape of your heart and how well your heart's chambers and valves are working. This procedure takes approximately one hour. There are no restrictions for this procedure. [place] has requested that you have a lower or upper extremity venous duplex. This test is an ultrasound of the veins in the legs or arms. It looks at venous blood flow that carries blood from the heart to the legs or arms. Allow one hour for a Lower Venous exam. Allow thirty minutes for an Upper Venous exam. There are no restrictions or special instructions.[place]    Follow-Up: At [facility], you and your health needs are our priority.  As part of our continuing mission to provide you with exceptional heart care, we have created designated Provider Care Teams.  These Care Teams include your primary Cardiologist (physician) and Advanced Practice Providers (APPs -  Physician Assistants and Nurse Practitioners) who all work together to provide you with the care you need, when you need it.  We recommend signing up for the patient portal called "MyChart".  Sign up information is provided on this After Visit Summary.  MyChart is used to connect with patients for Virtual Visits (Telemedicine).  Patients are able to view lab/test results, encounter notes, upcoming appointments, etc.  Non-urgent messages can be sent to your provider as well.   To learn more about what you can do with MyChart, go to [email or web address].    Your next appointment:   2 month(s)  The format for your next appointment:   In Person  Provider:   Minus Breeding, MD IN [place]

## 2021-01-18 ENCOUNTER — Telehealth: Payer: Self-pay | Admitting: *Deleted

## 2021-01-18 ENCOUNTER — Telehealth: Payer: Self-pay | Admitting: Emergency Medicine

## 2021-01-18 LAB — CBC
Hematocrit: 38.8 % (ref 34.0–46.6)
Hemoglobin: 13.3 g/dL (ref 11.1–15.9)
MCH: 29.4 pg (ref 26.6–33.0)
MCHC: 34.3 g/dL (ref 31.5–35.7)
MCV: 86 fL (ref 79–97)
Platelets: 288 10*3/uL (ref 150–450)
RBC: 4.53 x10E6/uL (ref 3.77–5.28)
RDW: 13.6 % (ref 11.7–15.4)
WBC: 8.5 10*3/uL (ref 3.4–10.8)

## 2021-01-18 LAB — TSH: TSH: 1.4 u[IU]/mL (ref 0.450–4.500)

## 2021-01-18 LAB — PRO B NATRIURETIC PEPTIDE: NT-Pro BNP: 2185 pg/mL — ABNORMAL HIGH (ref 0–738)

## 2021-01-18 MED ORDER — FUROSEMIDE 20 MG PO TABS: 20.0000 mg | ORAL_TABLET | Freq: Every day | ORAL | 6 refills | Status: DC

## 2021-01-18 NOTE — Telephone Encounter (Signed)
BNP is elevated.  Echo is pending.  Give Lasix 20 mg daily and check BMET in 10days.  Call Ms. Hedgepath with the results and send results to Neale Burly, MD  Lf for pt to c/b to discuss med change and lab work.  Message was left on home #.  NA on mobile # and no VM set up.

## 2021-01-18 NOTE — Telephone Encounter (Signed)
Spoke with pt who gave phone to daughter Abigail Butts.  All information reviewed with both pt and daughter.  Lab order for BMP mailed to pt's home address complete with DX and fax # to return results.  RX sent into [facility] as requested.  All questions were answered at the time of the call.

## 2021-01-18 NOTE — Telephone Encounter (Signed)
Follow Up:     Daughter is returning Pam [name]'s call drom today.

## 2021-01-18 NOTE — Addendum Note (Signed)
Addended by: Merri Ray A on: 01/18/2021 10:30 AM   Modules accepted: Orders

## 2021-01-18 NOTE — Telephone Encounter (Signed)
Post ED Visit - Positive Culture Follow-up  Culture report reviewed by antimicrobial stewardship pharmacist: [place] Team []  Elenor Quinones, Pharm.D. []  Heide Guile, Pharm.D., BCPS AQ-ID []  Parks Neptune, Pharm.D., BCPS []  Alycia Rossetti, Pharm.D., BCPS []  [place], Pharm.D., BCPS, AAHIVP []  Legrand Como, Pharm.D., BCPS, AAHIVP []  Salome Arnt, PharmD, BCPS []  Johnnette Gourd, PharmD, BCPS []  Hughes Better, PharmD, BCPS []  Leeroy Cha, PharmD []  Laqueta Linden, PharmD, BCPS []  Albertina Parr, PharmD  [place] Team []  Leodis Sias, PharmD []  Lindell Spar, PharmD []  Royetta Asal, PharmD []  Graylin Shiver, Rph []  Rema Fendt) Glennon Mac, PharmD []  Arlyn Dunning, PharmD []  Netta Cedars, PharmD []  Dia Sitter, PharmD []  Leone Haven, PharmD []  Gretta Arab, PharmD []  Theodis Shove, PharmD []  Peggyann Juba, PharmD []  Reuel Boom, PharmD   Positive urine culture Treated with nitrofurantoin, organism sensitive to the same and no further patient follow-up is required at this time.  [name] 01/18/2021, 1:05 PM

## 2021-01-30 DIAGNOSIS — I480 Paroxysmal atrial fibrillation: Secondary | ICD-10-CM | POA: Diagnosis not present

## 2021-01-30 DIAGNOSIS — R0602 Shortness of breath: Secondary | ICD-10-CM | POA: Diagnosis not present

## 2021-01-31 ENCOUNTER — Other Ambulatory Visit: Payer: Self-pay

## 2021-01-31 ENCOUNTER — Ambulatory Visit (HOSPITAL_COMMUNITY)
Admission: RE | Admit: 2021-01-31 | Discharge: 2021-01-31 | Disposition: A | Discharge status: Home / Self Care | Payer: Medicare Other | Source: Ambulatory Visit | Attending: Cardiology | Admitting: Cardiology

## 2021-01-31 DIAGNOSIS — R600 Localized edema: Secondary | ICD-10-CM

## 2021-01-31 DIAGNOSIS — R0602 Shortness of breath: Secondary | ICD-10-CM | POA: Diagnosis not present

## 2021-01-31 LAB — ECHOCARDIOGRAM COMPLETE
Area-P 1/2: 5.23 cm2
MV M vel: 4.46 m/s
MV Peak grad: 79.6 mmHg
S' Lateral: 3.4 cm

## 2021-01-31 NOTE — Progress Notes (Signed)
*  PRELIMINARY RESULTS* Echocardiogram 2D Echocardiogram has been performed.  Pam [name] 01/31/2021, 3:12 PM

## 2021-02-01 ENCOUNTER — Encounter: Payer: Self-pay | Admitting: *Deleted

## 2021-02-15 DIAGNOSIS — Z20822 Contact with and (suspected) exposure to covid-19: Secondary | ICD-10-CM | POA: Diagnosis not present

## 2021-02-24 DIAGNOSIS — I480 Paroxysmal atrial fibrillation: Secondary | ICD-10-CM | POA: Diagnosis not present

## 2021-03-14 DIAGNOSIS — Z20822 Contact with and (suspected) exposure to covid-19: Secondary | ICD-10-CM | POA: Diagnosis not present

## 2021-03-17 DIAGNOSIS — I480 Paroxysmal atrial fibrillation: Secondary | ICD-10-CM | POA: Diagnosis not present

## 2021-03-19 DIAGNOSIS — M7989 Other specified soft tissue disorders: Secondary | ICD-10-CM | POA: Insufficient documentation

## 2021-03-19 DIAGNOSIS — R0602 Shortness of breath: Secondary | ICD-10-CM | POA: Insufficient documentation

## 2021-03-19 NOTE — Progress Notes (Signed)
Cardiology Office Note   Date:  03/20/2021   ID:  Pam [name], Pam [name] Aug 09, 1940, MRN [id number]  PCP:  Neale Burly, MD  Cardiologist:   Minus Breeding, MD  Chief Complaint  Patient presents with   Shortness of Breath        History of Present Illness: KIARNA HEGGER is a 80 y.o. female who was referred by Neale Burly, MD for evaluation of atrial fib.   After I last saw her I ordered an echo which was unremarkable. She had cardioversion but only stayed in sinus rhythm for about two days.   She went back into atrial fib.  She is now being managed with rate control.    Since I last saw her I sent her for an echo and the EF was normal.  She did have an elevated BNP and she is being managed with Lasix.  She said this did help with some increased lower extremity swelling but it did not help with her breathing.  Of note she had lower extremity Doppler and there is no evidence of DVT.  She did have a small PFO noted on her echo.  She did not have elevated pulmonary pressures.  She still short of breath walking a short distance to the mailbox.  She gets short of breath walking out of [employer] the other day.  She is not describing PND or orthopnea.  She is not having any new chest pressure, neck or arm discomfort.  She does not have any presyncope or syncope.  She would not know she was in atrial fibrillation.   Past Medical History:  Diagnosis Date   Arthritis    Atrial fibrillation ([place])    Cancer [facility])    BREAST CANCER / CERVICAL CANCER    Cervical cancer ([place])    Colitis    Dysrhythmia    History of breast cancer 2006   Dr. Sonny Dandy oncologist - left breast   History of gout    Hyperlipidemia    Self-catheterizes urinary bladder    Urinary retention Sept 2016   s/p hysterectomy    Past Surgical History:  Procedure Laterality Date   BIOPSY  09/09/2020   Procedure: BIOPSY;  Surgeon: Daneil Dolin, MD;  [facility];  Service: Endoscopy;;   BREAST SURGERY      CARDIOVERSION N/A 06/03/2019   Procedure: CARDIOVERSION;  Surgeon: Jerline Pain, MD;  Location: [place];  Service: Cardiovascular;  Laterality: N/A;   CATARACT EXTRACTION W/ INTRAOCULAR LENS  IMPLANT, BILATERAL     COLONOSCOPY WITH PROPOFOL N/A 09/09/2020   Procedure: COLONOSCOPY WITH PROPOFOL;  Surgeon: Daneil Dolin, MD;  [facility];  Service: Endoscopy;  Laterality: N/A;  2:45pm   EYE SURGERY     INGUINAL HERNIA REPAIR Right 12/14/2015   Procedure: LAPAROSCOPIC AND OPEN RIGHT  INGUINAL HERNIA;  Surgeon: Johnathan Hausen, MD;  [facility];  Service: General;  Laterality: Right;   KNEE ARTHROSCOPY Left    MASTECTOMY Left 2006   MASTECTOMY WITH AXILLARY LYMPH NODE DISSECTION Left    ROBOTIC ASSISTED TOTAL HYSTERECTOMY WITH BILATERAL SALPINGO OOPHERECTOMY Bilateral 04/07/2015   Procedure: ROBOTIC ASSISTED RADICAL HYSTERECTOMY BILATERAL SALPINGO OOPHORECTOMY BILATERAL SENTINEL LYMPHADENETOMY;  Surgeon: Everitt Amber, MD;  [facility];  Service: Gynecology;  Laterality: Bilateral;   TOTAL KNEE ARTHROPLASTY Left 07/13/2019   Procedure: LEFT TOTAL KNEE ARTHROPLASTY-CEMENTED;  Surgeon: Marybelle Killings, MD;  Location: [place];  Service: Orthopedics;  Laterality: Left;  TOTAL KNEE ARTHROPLASTY Right 02/15/2020   Procedure: RIGHT TOTAL KNEE ARTHROPLASTY;  Surgeon: Marybelle Killings, MD;  Location: [place];  Service: Orthopedics;  Laterality: Right;   TUBAL LIGATION       Current Outpatient Medications  Medication Sig Dispense Refill   bisacodyl (DULCOLAX) 5 MG EC tablet Take 5 mg by mouth daily as needed for mild constipation.      diltiazem (TIAZAC) 360 MG 24 hr capsule Take 360 mg by mouth daily.     fexofenadine (ALLEGRA) 180 MG tablet Take 180 mg by mouth daily.     furosemide (LASIX) 20 MG tablet Take 1 tablet (20 mg total) by mouth daily. 30 tablet 6   gabapentin (NEURONTIN) 100 MG capsule Take by mouth.     metoprolol tartrate (LOPRESSOR) 25 MG tablet Take 1 tablet by  mouth twice daily 180 tablet 1   nitrofurantoin, macrocrystal-monohydrate, (MACROBID) 100 MG capsule Take 1 capsule (100 mg total) by mouth 2 (two) times daily. 14 capsule 0   rOPINIRole (REQUIP) 1 MG tablet Take 1 mg by mouth at bedtime.  0   traMADol (ULTRAM) 50 MG tablet Take 50 mg by mouth every 12 (twelve) hours as needed (pain).     warfarin (COUMADIN) 5 MG tablet Take 5 mg by mouth every other day. In the morning (alternates with 7.5 mg tablet)     warfarin (COUMADIN) 7.5 MG tablet Take 7.5 mg by mouth every other day. In the morning (alternates with 5 mg tablet)     No current facility-administered medications for this visit.    Allergies:   Aspirin, Ciprofloxacin, Flagyl [metronidazole], and Penicillins    ROS:  Please see the history of present illness.   Otherwise, review of systems are positive for none.   All other systems are reviewed and negative.    PHYSICAL EXAM: VS:  BP 106/64 (BP Location: Right Arm, Patient Position: Sitting, Cuff Size: Normal)   Pulse 80   Ht '5\' 3"'$  (1.6 m)   Wt 139 lb (63 kg)   BMI 24.62 kg/m  , BMI Body mass index is 24.62 kg/m. GENERAL:  Well appearing NECK:  No jugular venous distention, waveform within normal limits, carotid upstroke brisk and symmetric, no bruits, no thyromegaly LUNGS:  Clear to auscultation bilaterally CHEST:  Unremarkable HEART:  PMI not displaced or sustained,S1 and S2 within normal limits, no S3, no clicks, no rubs, no murmurs, irregular  ABD:  Flat, positive bowel sounds normal in frequency in pitch, no bruits, no rebound, no guarding, no midline pulsatile mass, no hepatomegaly, no splenomegaly EXT:  2 plus pulses throughout, no edema, no cyanosis no clubbing  EKG:  EKG is  ordered today. Atrial fibrillation, rate 80, axis within normal limits, intervals within normal limits, no acute ST-T wave changes  Recent Labs: 01/14/2021: BUN 21; Creatinine, Ser 1.10; Potassium 4.1; Sodium 138 01/17/2021: Hemoglobin 13.3; NT-Pro  BNP 2,185; Platelets 288; TSH 1.400    Lipid Panel No results found for: CHOL, TRIG, HDL, CHOLHDL, VLDL, LDLCALC, LDLDIRECT    Wt Readings from Last 3 Encounters:  03/20/21 139 lb (63 kg)  01/17/21 140 lb (63.5 kg)  01/14/21 140 lb (63.5 kg)      Other studies Reviewed: Additional studies/ records that were reviewed today include: Labs. Review of the above records demonstrates:  Please see elsewhere in the note.     ASSESSMENT AND PLAN:  ATRIAL FIB:   Ms. SYRIANA SANCHEZ has a CHA2DS2 - VASc score of 3.  This may be contributing to her shortness of breath but I am not convinced of that.  If I do not find another etiology I likely would need to consider cardioversion with an antiarrhythmic.    SOB: She will remain on the current dose of diuretic.  I did follow-up labs and these were okay after starting the Lasix.  I will order pulmonary function testing and chest x-ray as next steps.    Current medicines are reviewed at length with the patient today.  The patient does not have concerns regarding medicines.  The following changes have been made:  None  Labs/ tests ordered today include:   None  Orders Placed This Encounter  Procedures   DG Chest 2 View   EKG 12-Lead   Pulmonary Function Test       Disposition:   FU with me 1 month.   Signed, Minus Breeding, MD  03/20/2021 12:22 PM    [facility]

## 2021-03-20 ENCOUNTER — Ambulatory Visit (INDEPENDENT_AMBULATORY_CARE_PROVIDER_SITE_OTHER): Payer: Medicare Other | Admitting: Cardiology

## 2021-03-20 ENCOUNTER — Other Ambulatory Visit: Payer: Self-pay

## 2021-03-20 ENCOUNTER — Encounter: Payer: Self-pay | Admitting: Cardiology

## 2021-03-20 ENCOUNTER — Ambulatory Visit (HOSPITAL_COMMUNITY)
Admission: RE | Admit: 2021-03-20 | Discharge: 2021-03-20 | Disposition: A | Discharge status: Home / Self Care | Payer: Medicare Other | Source: Ambulatory Visit | Attending: Cardiology | Admitting: Cardiology

## 2021-03-20 VITALS — BP 106/64 | HR 80 | Ht 63.0 in | Wt 139.0 lb

## 2021-03-20 DIAGNOSIS — R0602 Shortness of breath: Secondary | ICD-10-CM

## 2021-03-20 DIAGNOSIS — I4819 Other persistent atrial fibrillation: Secondary | ICD-10-CM

## 2021-03-20 DIAGNOSIS — M7989 Other specified soft tissue disorders: Secondary | ICD-10-CM

## 2021-03-20 NOTE — Patient Instructions (Signed)
Medication Instructions:  No changes  *If you need a refill on your cardiac medications before your next appointment, please call your pharmacy*   Lab Work: Not needed   Testing/Procedures: Will be schedule at [facility] chest x-ray takes a picture of the organs and structures inside the chest, including the heart, lungs, and blood vessels. This test can show several things, including, whether the heart is enlarges; whether fluid is building up in the lungs; and whether pacemaker / defibrillator leads are still in place.  And Your physician has recommended that you have a pulmonary function test. Pulmonary Function Tests are a group of tests that measure how well air moves in and out of your lungs.    Follow-Up: At [facility] you and your health needs are our priority.  As part of our continuing mission to provide you with exceptional heart care, we have created designated Provider Care Teams.  These Care Teams include your primary Cardiologist (physician) and Advanced Practice Providers (APPs -  Physician Assistants and Nurse Practitioners) who all work together to provide you with the care you need, when you need it.  We recommend signing up for the patient portal called "MyChart".  Sign up information is provided on this After Visit Summary.  MyChart is used to connect with patients for Virtual Visits (Telemedicine).  Patients are able to view lab/test results, encounter notes, upcoming appointments, etc.  Non-urgent messages can be sent to your provider as well.   To learn more about what you can do with MyChart, go to [email or web address].    Your next appointment:   1 month(s)  The format for your next appointment:   In Person  Provider:   Minus Breeding, MD

## 2021-03-24 DIAGNOSIS — Z20822 Contact with and (suspected) exposure to covid-19: Secondary | ICD-10-CM | POA: Diagnosis not present

## 2021-03-26 DIAGNOSIS — Z20822 Contact with and (suspected) exposure to covid-19: Secondary | ICD-10-CM | POA: Diagnosis not present

## 2021-03-28 DIAGNOSIS — M172 Bilateral post-traumatic osteoarthritis of knee: Secondary | ICD-10-CM | POA: Diagnosis not present

## 2021-03-28 DIAGNOSIS — I480 Paroxysmal atrial fibrillation: Secondary | ICD-10-CM | POA: Diagnosis not present

## 2021-03-28 DIAGNOSIS — I10 Essential (primary) hypertension: Secondary | ICD-10-CM | POA: Diagnosis not present

## 2021-03-28 DIAGNOSIS — I700 Atherosclerosis of aorta: Secondary | ICD-10-CM | POA: Diagnosis not present

## 2021-03-29 ENCOUNTER — Other Ambulatory Visit (HOSPITAL_COMMUNITY)
Admission: RE | Admit: 2021-03-29 | Discharge: 2021-03-29 | Disposition: A | Discharge status: Home / Self Care | Payer: Medicare Other | Source: Ambulatory Visit | Attending: Cardiology | Admitting: Cardiology

## 2021-03-29 ENCOUNTER — Other Ambulatory Visit: Payer: Self-pay

## 2021-03-29 DIAGNOSIS — Z20822 Contact with and (suspected) exposure to covid-19: Secondary | ICD-10-CM | POA: Insufficient documentation

## 2021-03-29 DIAGNOSIS — Z01812 Encounter for preprocedural laboratory examination: Secondary | ICD-10-CM | POA: Diagnosis not present

## 2021-03-30 LAB — SARS CORONAVIRUS 2 (TAT 6-24 HRS): SARS Coronavirus 2: NEGATIVE

## 2021-03-31 ENCOUNTER — Ambulatory Visit (HOSPITAL_COMMUNITY)
Admission: RE | Admit: 2021-03-31 | Discharge: 2021-03-31 | Disposition: A | Discharge status: Home / Self Care | Payer: Medicare Other | Source: Ambulatory Visit | Attending: Cardiology | Admitting: Cardiology

## 2021-03-31 ENCOUNTER — Other Ambulatory Visit: Payer: Self-pay

## 2021-03-31 DIAGNOSIS — R0602 Shortness of breath: Secondary | ICD-10-CM | POA: Diagnosis not present

## 2021-03-31 DIAGNOSIS — I4819 Other persistent atrial fibrillation: Secondary | ICD-10-CM | POA: Diagnosis not present

## 2021-03-31 LAB — PULMONARY FUNCTION TEST
DL/VA % pred: 101 %
DL/VA: 4.17 ml/min/mmHg/L
DLCO unc % pred: 88 %
DLCO unc: 16.16 ml/min/mmHg
FEF 25-75 Post: 1.57 L/sec
FEF 25-75 Pre: 1.74 L/sec
FEF2575-%Change-Post: -9 %
FEF2575-%Pred-Post: 104 %
FEF2575-%Pred-Pre: 115 %
FEV1-%Change-Post: -2 %
FEV1-%Pred-Post: 91 %
FEV1-%Pred-Pre: 94 %
FEV1-Post: 1.79 L
FEV1-Pre: 1.84 L
FEV1FVC-%Change-Post: -5 %
FEV1FVC-%Pred-Pre: 106 %
FEV6-%Change-Post: 1 %
FEV6-%Pred-Post: 95 %
FEV6-%Pred-Pre: 93 %
FEV6-Post: 2.37 L
FEV6-Pre: 2.32 L
FEV6FVC-%Change-Post: 0 %
FEV6FVC-%Pred-Post: 104 %
FEV6FVC-%Pred-Pre: 105 %
FVC-%Change-Post: 2 %
FVC-%Pred-Post: 91 %
FVC-%Pred-Pre: 88 %
FVC-Post: 2.39 L
FVC-Pre: 2.32 L
Post FEV1/FVC ratio: 75 %
Post FEV6/FVC ratio: 99 %
Pre FEV1/FVC ratio: 79 %
Pre FEV6/FVC Ratio: 100 %
RV % pred: 106 %
RV: 2.43 L
TLC % pred: 96 %
TLC: 4.74 L

## 2021-03-31 MED ORDER — ALBUTEROL SULFATE (2.5 MG/3ML) 0.083% IN NEBU
2.5000 mg | INHALATION_SOLUTION | Freq: Once | RESPIRATORY_TRACT | Status: AC
  Administered 2021-03-31: 2.5 mg via RESPIRATORY_TRACT

## 2021-04-07 DIAGNOSIS — I480 Paroxysmal atrial fibrillation: Secondary | ICD-10-CM | POA: Diagnosis not present

## 2021-04-21 DIAGNOSIS — I10 Essential (primary) hypertension: Secondary | ICD-10-CM | POA: Diagnosis not present

## 2021-04-21 DIAGNOSIS — M172 Bilateral post-traumatic osteoarthritis of knee: Secondary | ICD-10-CM | POA: Diagnosis not present

## 2021-04-21 DIAGNOSIS — I480 Paroxysmal atrial fibrillation: Secondary | ICD-10-CM | POA: Diagnosis not present

## 2021-04-21 DIAGNOSIS — I700 Atherosclerosis of aorta: Secondary | ICD-10-CM | POA: Diagnosis not present

## 2021-05-05 DIAGNOSIS — I480 Paroxysmal atrial fibrillation: Secondary | ICD-10-CM | POA: Diagnosis not present

## 2021-05-09 ENCOUNTER — Telehealth: Payer: Self-pay | Admitting: *Deleted

## 2021-05-09 NOTE — Telephone Encounter (Signed)
Ortho bundle 1 year call completed. ?

## 2021-05-12 ENCOUNTER — Other Ambulatory Visit (HOSPITAL_COMMUNITY): Payer: Medicare Other

## 2021-05-12 DIAGNOSIS — Z1231 Encounter for screening mammogram for malignant neoplasm of breast: Secondary | ICD-10-CM | POA: Diagnosis not present

## 2021-05-16 ENCOUNTER — Encounter (HOSPITAL_COMMUNITY): Payer: Medicare Other

## 2021-05-22 DIAGNOSIS — I10 Essential (primary) hypertension: Secondary | ICD-10-CM | POA: Diagnosis not present

## 2021-05-22 DIAGNOSIS — I700 Atherosclerosis of aorta: Secondary | ICD-10-CM | POA: Diagnosis not present

## 2021-05-22 DIAGNOSIS — I480 Paroxysmal atrial fibrillation: Secondary | ICD-10-CM | POA: Diagnosis not present

## 2021-05-23 NOTE — Progress Notes (Signed)
Cardiology Office Note   Date:  05/24/2021   ID:  Donita, [place] 24-Aug-1940, MRN [id number]  PCP:  Neale Burly, MD  Cardiologist:   Minus Breeding, MD  Chief Complaint  Patient presents with   Shortness of Breath     History of Present Illness: Pam [name] is a 80 y.o. female who was referred by Neale Burly, MD for evaluation of atrial fib.   After I last saw her I ordered an echo which was unremarkable. She had cardioversion but only stayed in sinus rhythm for about two days.   She went back into atrial fib.  She is now being managed with rate control.    She had an echo and the EF was normal.  She did have an elevated BNP and she is being managed with Lasix.   PFTs and CXR were normal.   She unfortunately still gets very short of breath walking mild to moderate distance on level ground.  They have to stop several times just going out shopping.  She is not describing new PND or orthopnea.  She is not having any new palpitations.  She has no chest pressure, neck or arm discomfort.  She has had no weight gain or edema.   Past Medical History:  Diagnosis Date   Arthritis    Atrial fibrillation ([place])    Cancer [facility])    BREAST CANCER / CERVICAL CANCER    Cervical cancer ([place])    Colitis    Dysrhythmia    History of breast cancer 2006   Dr. Sonny Dandy oncologist - left breast   History of gout    Hyperlipidemia    Self-catheterizes urinary bladder    Urinary retention Sept 2016   s/p hysterectomy    Past Surgical History:  Procedure Laterality Date   BIOPSY  09/09/2020   Procedure: BIOPSY;  Surgeon: Daneil Dolin, MD;  [facility];  Service: Endoscopy;;   BREAST SURGERY     CARDIOVERSION N/A 06/03/2019   Procedure: CARDIOVERSION;  Surgeon: Jerline Pain, MD;  Location: [place];  Service: Cardiovascular;  Laterality: N/A;   CATARACT EXTRACTION W/ INTRAOCULAR LENS  IMPLANT, BILATERAL     COLONOSCOPY WITH PROPOFOL N/A 09/09/2020   Procedure:  COLONOSCOPY WITH PROPOFOL;  Surgeon: Daneil Dolin, MD;  [facility];  Service: Endoscopy;  Laterality: N/A;  2:45pm   EYE SURGERY     INGUINAL HERNIA REPAIR Right 12/14/2015   Procedure: LAPAROSCOPIC AND OPEN RIGHT  INGUINAL HERNIA;  Surgeon: Johnathan Hausen, MD;  [facility];  Service: General;  Laterality: Right;   KNEE ARTHROSCOPY Left    MASTECTOMY Left 2006   MASTECTOMY WITH AXILLARY LYMPH NODE DISSECTION Left    ROBOTIC ASSISTED TOTAL HYSTERECTOMY WITH BILATERAL SALPINGO OOPHERECTOMY Bilateral 04/07/2015   Procedure: ROBOTIC ASSISTED RADICAL HYSTERECTOMY BILATERAL SALPINGO OOPHORECTOMY BILATERAL SENTINEL LYMPHADENETOMY;  Surgeon: Everitt Amber, MD;  [facility];  Service: Gynecology;  Laterality: Bilateral;   TOTAL KNEE ARTHROPLASTY Left 07/13/2019   Procedure: LEFT TOTAL KNEE ARTHROPLASTY-CEMENTED;  Surgeon: Marybelle Killings, MD;  Location: [place];  Service: Orthopedics;  Laterality: Left;   TOTAL KNEE ARTHROPLASTY Right 02/15/2020   Procedure: RIGHT TOTAL KNEE ARTHROPLASTY;  Surgeon: Marybelle Killings, MD;  Location: [place];  Service: Orthopedics;  Laterality: Right;   TUBAL LIGATION       Current Outpatient Medications  Medication Sig Dispense Refill   bisacodyl (DULCOLAX) 5 MG EC tablet Take 5  mg by mouth daily as needed for mild constipation.      diltiazem (TIAZAC) 360 MG 24 hr capsule Take 360 mg by mouth daily.     fexofenadine (ALLEGRA) 180 MG tablet Take 180 mg by mouth daily.     furosemide (LASIX) 20 MG tablet Take 1 tablet (20 mg total) by mouth daily. 30 tablet 6   gabapentin (NEURONTIN) 100 MG capsule Take by mouth.     gabapentin (NEURONTIN) 300 MG capsule Take 300 mg by mouth at bedtime.     metoprolol tartrate (LOPRESSOR) 25 MG tablet Take 1 tablet by mouth twice daily 180 tablet 1   nitrofurantoin, macrocrystal-monohydrate, (MACROBID) 100 MG capsule Take 1 capsule (100 mg total) by mouth 2 (two) times daily. 14 capsule 0   rOPINIRole (REQUIP) 1 MG tablet  Take 1 mg by mouth at bedtime.  0   traMADol (ULTRAM) 50 MG tablet Take 50 mg by mouth every 12 (twelve) hours as needed (pain).     warfarin (COUMADIN) 5 MG tablet Take 5 mg by mouth every other day. In the morning (alternates with 7.5 mg tablet)     warfarin (COUMADIN) 7.5 MG tablet Take 7.5 mg by mouth every other day. In the morning (alternates with 5 mg tablet)     No current facility-administered medications for this visit.    Allergies:   Aspirin, Ciprofloxacin, Flagyl [metronidazole], and Penicillins    ROS:  Please see the history of present illness.   Otherwise, review of systems are positive for none.   All other systems are reviewed and negative.    PHYSICAL EXAM: VS:  BP 105/60   Pulse 75   Ht 5\' 3"  (1.6 m)   Wt 141 lb 9.6 oz (64.2 kg)   SpO2 96%   BMI 25.08 kg/m  , BMI Body mass index is 25.08 kg/m. GENERAL:  Well appearing NECK:  No jugular venous distention, waveform within normal limits, carotid upstroke brisk and symmetric, no bruits, no thyromegaly LUNGS:  Clear to auscultation bilaterally CHEST:  Unremarkable HEART:  PMI not displaced or sustained,S1 and S2 within normal limits, no S3, no clicks, no rubs, no murmurs, irregular  ABD:  Flat, positive bowel sounds normal in frequency in pitch, no bruits, no rebound, no guarding, no midline pulsatile mass, no hepatomegaly, no splenomegaly EXT:  2 plus pulses throughout, no edema, no cyanosis no clubbing   EKG:  EKG is not ordered today.  Recent Labs: 01/14/2021: BUN 21; Creatinine, Ser 1.10; Potassium 4.1; Sodium 138 01/17/2021: Hemoglobin 13.3; NT-Pro BNP 2,185; Platelets 288; TSH 1.400    Lipid Panel No results found for: CHOL, TRIG, HDL, CHOLHDL, VLDL, LDLCALC, LDLDIRECT    Wt Readings from Last 3 Encounters:  05/24/21 141 lb 9.6 oz (64.2 kg)  03/20/21 139 lb (63 kg)  01/17/21 140 lb (63.5 kg)      Other studies Reviewed: Additional studies/ records that were reviewed today include: Results as  above. Review of the above records demonstrates:  Please see elsewhere in the note.     ASSESSMENT AND PLAN:  ATRIAL FIB:   Ms. Pam [name] has a CHA2DS2 - VASc score of 3.       This may be the etiology of her shortness of breath as I am not coming up with another etiology.  I am going to exclude ischemia.  If this test is negative I most likely would start flecainide and then consider another cardioversion.    SOB: She needs a  SPECT study.  If this is negative no further ischemia work-up will be suggested.   Of note her oxygen level was 97% walking around the office of when she was dyspneic.   Current medicines are reviewed at length with the patient today.  The patient does not have concerns regarding medicines.  The following changes have been made:  None  Labs/ tests ordered today include:   None  Orders Placed This Encounter  Procedures   MYOCARDIAL PERFUSION IMAGING     Disposition:   FU with me 6 months.    Signed, Minus Breeding, MD  05/24/2021 11:55 AM    [place]

## 2021-05-24 ENCOUNTER — Ambulatory Visit (INDEPENDENT_AMBULATORY_CARE_PROVIDER_SITE_OTHER): Payer: Medicare Other | Admitting: Cardiology

## 2021-05-24 ENCOUNTER — Other Ambulatory Visit: Payer: Self-pay

## 2021-05-24 ENCOUNTER — Encounter: Payer: Self-pay | Admitting: Cardiology

## 2021-05-24 VITALS — BP 105/60 | HR 75 | Ht 63.0 in | Wt 141.6 lb

## 2021-05-24 DIAGNOSIS — R0602 Shortness of breath: Secondary | ICD-10-CM

## 2021-05-24 DIAGNOSIS — I4819 Other persistent atrial fibrillation: Secondary | ICD-10-CM

## 2021-05-24 DIAGNOSIS — Z20822 Contact with and (suspected) exposure to covid-19: Secondary | ICD-10-CM | POA: Diagnosis not present

## 2021-05-24 NOTE — Patient Instructions (Signed)
Medication Instructions:  The current medical regimen is effective;  continue present plan and medications.  *If you need a refill on your cardiac medications before your next appointment, please call your pharmacy*   Testing/Procedures: Your physician has requested that you have a lexiscan myoview. For further information please visit [email or web address]. Please follow instruction sheet, as given.   Follow-Up: At [facility], you and your health needs are our priority.  As part of our continuing mission to provide you with exceptional heart care, we have created designated Provider Care Teams.  These Care Teams include your primary Cardiologist (physician) and Advanced Practice Providers (APPs -  Physician Assistants and Nurse Practitioners) who all work together to provide you with the care you need, when you need it.  We recommend signing up for the patient portal called "MyChart".  Sign up information is provided on this After Visit Summary.  MyChart is used to connect with patients for Virtual Visits (Telemedicine).  Patients are able to view lab/test results, encounter notes, upcoming appointments, etc.  Non-urgent messages can be sent to your provider as well.   To learn more about what you can do with MyChart, go to [email or web address].    Your next appointment:   6 month(s)  The format for your next appointment:   In Person  Provider:   James Hochrein, MD            

## 2021-05-26 NOTE — Addendum Note (Signed)
Addended by: Jacqulynn Cadet on: 05/26/2021 01:25 PM   Modules accepted: Orders

## 2021-06-02 DIAGNOSIS — I480 Paroxysmal atrial fibrillation: Secondary | ICD-10-CM | POA: Diagnosis not present

## 2021-06-02 NOTE — Addendum Note (Signed)
Addended by: Minus Breeding on: 06/02/2021 07:35 AM   Modules accepted: Orders

## 2021-06-08 ENCOUNTER — Telehealth (HOSPITAL_COMMUNITY): Payer: Self-pay | Admitting: *Deleted

## 2021-06-08 NOTE — Telephone Encounter (Signed)
Close encounter 

## 2021-06-13 ENCOUNTER — Ambulatory Visit (HOSPITAL_COMMUNITY)
Admission: RE | Admit: 2021-06-13 | Discharge: 2021-06-13 | Disposition: A | Discharge status: Home / Self Care | Payer: Medicare Other | Source: Ambulatory Visit | Attending: Internal Medicine | Admitting: Internal Medicine

## 2021-06-13 ENCOUNTER — Other Ambulatory Visit: Payer: Self-pay

## 2021-06-13 DIAGNOSIS — R0602 Shortness of breath: Secondary | ICD-10-CM | POA: Diagnosis not present

## 2021-06-13 LAB — MYOCARDIAL PERFUSION IMAGING
Peak HR: 71 {beats}/min
Rest HR: 64 {beats}/min
Rest Nuclear Isotope Dose: 8.1 mCi
SDS: 3
SRS: 9
SSS: 12
ST Depression (mm): 0 mm
Stress Nuclear Isotope Dose: 26 mCi
TID: 1.04

## 2021-06-13 MED ORDER — TECHNETIUM TC 99M TETROFOSMIN IV KIT
26.0000 | PACK | Freq: Once | INTRAVENOUS | Status: AC | PRN
  Administered 2021-06-13: 26 via INTRAVENOUS
  Filled 2021-06-13: qty 26

## 2021-06-13 MED ORDER — REGADENOSON 0.4 MG/5ML IV SOLN
0.4000 mg | Freq: Once | INTRAVENOUS | Status: AC
  Administered 2021-06-13: 0.4 mg via INTRAVENOUS

## 2021-06-13 MED ORDER — TECHNETIUM TC 99M TETROFOSMIN IV KIT
8.1000 | PACK | Freq: Once | INTRAVENOUS | Status: AC | PRN
  Administered 2021-06-13: 8.1 via INTRAVENOUS
  Filled 2021-06-13: qty 9

## 2021-06-19 ENCOUNTER — Encounter: Payer: Self-pay | Admitting: *Deleted

## 2021-06-23 ENCOUNTER — Other Ambulatory Visit: Payer: Self-pay | Admitting: Cardiology

## 2021-06-27 DIAGNOSIS — I480 Paroxysmal atrial fibrillation: Secondary | ICD-10-CM | POA: Diagnosis not present

## 2021-06-27 DIAGNOSIS — I10 Essential (primary) hypertension: Secondary | ICD-10-CM | POA: Diagnosis not present

## 2021-06-27 DIAGNOSIS — Z1331 Encounter for screening for depression: Secondary | ICD-10-CM | POA: Diagnosis not present

## 2021-06-27 DIAGNOSIS — I700 Atherosclerosis of aorta: Secondary | ICD-10-CM | POA: Diagnosis not present

## 2021-06-27 DIAGNOSIS — M172 Bilateral post-traumatic osteoarthritis of knee: Secondary | ICD-10-CM | POA: Diagnosis not present

## 2021-06-27 DIAGNOSIS — Z0000 Encounter for general adult medical examination without abnormal findings: Secondary | ICD-10-CM | POA: Diagnosis not present

## 2021-06-30 DIAGNOSIS — I480 Paroxysmal atrial fibrillation: Secondary | ICD-10-CM | POA: Diagnosis not present

## 2021-07-11 DIAGNOSIS — N3942 Incontinence without sensory awareness: Secondary | ICD-10-CM | POA: Diagnosis not present

## 2021-07-11 DIAGNOSIS — R338 Other retention of urine: Secondary | ICD-10-CM | POA: Diagnosis not present

## 2021-07-11 DIAGNOSIS — N130 Hydronephrosis with ureteropelvic junction obstruction: Secondary | ICD-10-CM | POA: Diagnosis not present

## 2021-07-28 DIAGNOSIS — I480 Paroxysmal atrial fibrillation: Secondary | ICD-10-CM | POA: Diagnosis not present

## 2021-08-22 DIAGNOSIS — I700 Atherosclerosis of aorta: Secondary | ICD-10-CM | POA: Diagnosis not present

## 2021-08-22 DIAGNOSIS — I10 Essential (primary) hypertension: Secondary | ICD-10-CM | POA: Diagnosis not present

## 2021-08-22 DIAGNOSIS — I480 Paroxysmal atrial fibrillation: Secondary | ICD-10-CM | POA: Diagnosis not present

## 2021-08-25 DIAGNOSIS — I700 Atherosclerosis of aorta: Secondary | ICD-10-CM | POA: Diagnosis not present

## 2021-08-25 DIAGNOSIS — I480 Paroxysmal atrial fibrillation: Secondary | ICD-10-CM | POA: Diagnosis not present

## 2021-08-25 DIAGNOSIS — I10 Essential (primary) hypertension: Secondary | ICD-10-CM | POA: Diagnosis not present

## 2021-09-26 DIAGNOSIS — I700 Atherosclerosis of aorta: Secondary | ICD-10-CM | POA: Diagnosis not present

## 2021-09-26 DIAGNOSIS — I480 Paroxysmal atrial fibrillation: Secondary | ICD-10-CM | POA: Diagnosis not present

## 2021-09-26 DIAGNOSIS — I10 Essential (primary) hypertension: Secondary | ICD-10-CM | POA: Diagnosis not present

## 2021-09-26 DIAGNOSIS — M172 Bilateral post-traumatic osteoarthritis of knee: Secondary | ICD-10-CM | POA: Diagnosis not present

## 2021-09-29 DIAGNOSIS — I480 Paroxysmal atrial fibrillation: Secondary | ICD-10-CM | POA: Diagnosis not present

## 2021-10-17 NOTE — Progress Notes (Signed)
She has had  ?  ?Cardiology Office Note ? ? ?Date:  10/18/2021  ? ?ID:  Pam [name], DOB April 16, 1941, MRN [id number] ? ?PCP:  Neale Burly, MD  ?Cardiologist:   Minus Breeding, MD ? ?Chief Complaint  ?Patient presents with  ? Shortness of Breath  ? ?  ?History of Present Illness: ?Pam [name] is a 81 y.o. female who was referred by Neale Burly, MD for evaluation of atrial fib.   Echo was unremarkable. She had cardioversion but only stayed in sinus rhythm for about two days.   She went back into atrial fib.  She is now being managed with rate control.    ? ?She continues to be short of breath.  Function testing, ischemia work-up and chest x-ray without clear etiology.  The only objective finding was an elevated BNP and I did start her on Lasix.  I followed up with some blood work and see that her creatinine and potassium tolerated this with labs from February.  However, really did not seem to improve her shortness of breath.  She still gets short of breath with mild to moderate activity.  She is not describing resting shortness of breath, PND or orthopnea.  Is not having any new palpitations, presyncope or syncope.  She has had no weight gain or edema. ? ? ? ?Past Medical History:  ?Diagnosis Date  ? Arthritis   ? Atrial fibrillation ([place])   ? Cancer [facility])   ? BREAST CANCER / CERVICAL CANCER   ? Cervical cancer ([place])   ? Colitis   ? Dysrhythmia   ? History of breast cancer 2006  ? Dr. Sonny Dandy oncologist - left breast  ? History of gout   ? Hyperlipidemia   ? Self-catheterizes urinary bladder   ? Urinary retention Sept 2016  ? s/p hysterectomy  ? ? ?Past Surgical History:  ?Procedure Laterality Date  ? BIOPSY  09/09/2020  ? Procedure: BIOPSY;  Surgeon: Daneil Dolin, MD;  [facility];  Service: Endoscopy;;  ? BREAST SURGERY    ? CARDIOVERSION N/A 06/03/2019  ? Procedure: CARDIOVERSION;  Surgeon: Jerline Pain, MD;  Location: [facility] ENDOSCOPY;  Service: Cardiovascular;  Laterality: N/A;  ? CATARACT  EXTRACTION W/ INTRAOCULAR LENS  IMPLANT, BILATERAL    ? COLONOSCOPY WITH PROPOFOL N/A 09/09/2020  ? Procedure: COLONOSCOPY WITH PROPOFOL;  Surgeon: Daneil Dolin, MD;  [facility];  Service: Endoscopy;  Laterality: N/A;  2:45pm  ? EYE SURGERY    ? INGUINAL HERNIA REPAIR Right 12/14/2015  ? Procedure: LAPAROSCOPIC AND OPEN RIGHT  INGUINAL HERNIA;  Surgeon: Johnathan Hausen, MD;  [facility]: [facility];  Service: General;  Laterality: Right;  ? KNEE ARTHROSCOPY Left   ? MASTECTOMY Left 2006  ? MASTECTOMY WITH AXILLARY LYMPH NODE DISSECTION Left   ? ROBOTIC ASSISTED TOTAL HYSTERECTOMY WITH BILATERAL SALPINGO OOPHERECTOMY Bilateral 04/07/2015  ? Procedure: ROBOTIC ASSISTED RADICAL HYSTERECTOMY BILATERAL SALPINGO OOPHORECTOMY BILATERAL SENTINEL LYMPHADENETOMY;  Surgeon: Everitt Amber, MD;  [facility];  Service: Gynecology;  Laterality: Bilateral;  ? TOTAL KNEE ARTHROPLASTY Left 07/13/2019  ? Procedure: LEFT TOTAL KNEE ARTHROPLASTY-CEMENTED;  Surgeon: Marybelle Killings, MD;  Location: [place];  Service: Orthopedics;  Laterality: Left;  ? TOTAL KNEE ARTHROPLASTY Right 02/15/2020  ? Procedure: RIGHT TOTAL KNEE ARTHROPLASTY;  Surgeon: Marybelle Killings, MD;  Location: [place];  Service: Orthopedics;  Laterality: Right;  ? TUBAL LIGATION    ? ? ? ?Current Outpatient Medications  ?Medication Sig  Dispense Refill  ? bisacodyl (DULCOLAX) 5 MG EC tablet Take 5 mg by mouth daily as needed for mild constipation.     ? diltiazem (TIAZAC) 360 MG 24 hr capsule Take 360 mg by mouth daily.    ? fexofenadine (ALLEGRA) 180 MG tablet Take 180 mg by mouth daily.    ? metoprolol tartrate (LOPRESSOR) 25 MG tablet Take 1 tablet by mouth twice daily 180 tablet 1  ? rOPINIRole (REQUIP) 1 MG tablet Take 1 mg by mouth at bedtime.  0  ? traMADol (ULTRAM) 50 MG tablet Take 50 mg by mouth every 12 (twelve) hours as needed (pain).    ? warfarin (COUMADIN) 5 MG tablet Take 5 mg by mouth every other day. In the morning (alternates with 7.5 mg tablet)    ?  warfarin (COUMADIN) 7.5 MG tablet Take 7.5 mg by mouth every other day. In the morning (alternates with 5 mg tablet)    ? nitrofurantoin, macrocrystal-monohydrate, (MACROBID) 100 MG capsule Take 1 capsule (100 mg total) by mouth 2 (two) times daily. (Patient not taking: Reported on 10/18/2021) 14 capsule 0  ? ?No current facility-administered medications for this visit.  ? ? ?Allergies:   Aspirin, Ciprofloxacin, Flagyl [metronidazole], and Penicillins  ? ? ?ROS:  Please see the history of present illness.   Otherwise, review of systems are positive for none.   All other systems are reviewed and negative.  ? ? ?PHYSICAL EXAM: ?VS:  BP 122/68   Pulse (!) 52   Ht '5\' 3"'$  (1.6 m)   Wt 141 lb (64 kg)   BMI 24.98 kg/m?  , BMI Body mass index is 24.98 kg/m?. ?GENERAL:  Well appearing ?NECK:  No jugular venous distention, waveform within normal limits, carotid upstroke brisk and symmetric, no bruits, no thyromegaly ?LUNGS:  Clear to auscultation bilaterally ?CHEST:  Unremarkable ?HEART:  PMI not displaced or sustained,S1 and S2 within normal limits, no S3, no clicks, no rubs, no murmurs, irregular  ?ABD:  Flat, positive bowel sounds normal in frequency in pitch, no bruits, no rebound, no guarding, no midline pulsatile mass, no hepatomegaly, no splenomegaly ?EXT:  2 plus pulses throughout, no edema, no cyanosis no clubbing ? ? ?EKG:  EKG is  ordered today. ?Atrial fibrillation, rate 62, axis within normal limits, intervals within normal limits, low voltage in the chest leads. ? ? ?Recent Labs: ?01/14/2021: BUN 21; Creatinine, Ser 1.10; Potassium 4.1; Sodium 138 ?01/17/2021: Hemoglobin 13.3; NT-Pro BNP 2,185; Platelets 288; TSH 1.400  ? ? ?Lipid Panel ?No results found for: CHOL, TRIG, HDL, CHOLHDL, VLDL, LDLCALC, LDLDIRECT ?  ? ?Wt Readings from Last 3 Encounters:  ?10/18/21 141 lb (64 kg)  ?06/13/21 141 lb (64 kg)  ?05/24/21 141 lb 9.6 oz (64.2 kg)  ?  ? ? ?Other studies Reviewed: ?Additional studies/ records that were  reviewed today include: None ?Review of the above records demonstrates:  Please see elsewhere in the note.   ? ? ?ASSESSMENT AND PLAN: ? ?ATRIAL FIB:   Pam [name] has a CHA2DS2 - VASc score of 3.     She tolerates anticoagulation.  No change in therapy. ? ?SOB:      Perfusion study was negative for ischemia.  I am going to increase her Lasix and I will follow-up with a basic metabolic profile in about 2 weeks.  Of note we previously had walked her around the office and her sats stayed 97% although she was short of breath.  She is going to  check her oxygen saturations at home when she is short of breath.  ? ? ?CM: I will check an echocardiogram in July. ? ?Current medicines are reviewed at length with the patient today.  The patient does not have concerns regarding medicines. ? ?The following changes have been made:  None ? ?Labs/ tests ordered today include:  None ? ?Orders Placed This Encounter  ?Procedures  ? Basic metabolic panel  ? EKG 12-Lead  ? ECHOCARDIOGRAM COMPLETE  ? ? ? ?Disposition:   FU with me August ? ? ?Signed, ?Minus Breeding, MD  ?10/18/2021 1:32 PM    ?[place] ? ? ?

## 2021-10-18 ENCOUNTER — Ambulatory Visit (INDEPENDENT_AMBULATORY_CARE_PROVIDER_SITE_OTHER): Payer: Medicare Other | Admitting: Cardiology

## 2021-10-18 ENCOUNTER — Encounter: Payer: Self-pay | Admitting: Cardiology

## 2021-10-18 VITALS — BP 122/68 | HR 52 | Ht 63.0 in | Wt 141.0 lb

## 2021-10-18 DIAGNOSIS — I4891 Unspecified atrial fibrillation: Secondary | ICD-10-CM | POA: Diagnosis not present

## 2021-10-18 DIAGNOSIS — Z79899 Other long term (current) drug therapy: Secondary | ICD-10-CM | POA: Diagnosis not present

## 2021-10-18 DIAGNOSIS — R0602 Shortness of breath: Secondary | ICD-10-CM

## 2021-10-18 MED ORDER — FUROSEMIDE 20 MG PO TABS: 40.0000 mg | ORAL_TABLET | Freq: Every day | ORAL | 3 refills | Status: DC

## 2021-10-18 NOTE — Patient Instructions (Addendum)
Medication Instructions:  ?Your physician has recommended you make the following change in your medication:  ?INCREASE LASIX to 40 mg once daily ? ?*If you need a refill on your cardiac medications before your next appointment, please call your pharmacy* ? ? ?Lab Work: ?Your physician recommends that you return for lab work in: 2 weeks for [employer].  You do NOT need to be fasting.  You can stop by a [facility] for this blood work. ? ?If you have labs (blood work) drawn today and your tests are completely normal, you will receive your results only by: ?MyChart Message (if you have MyChart) OR ?A paper copy in the mail ?If you have any lab test that is abnormal or we need to change your treatment, we will call you to review the results. ? ? ?Testing/Procedures: ?Your physician has requested that you have an echocardiogram July. Echocardiography is a painless test that uses sound waves to create images of your heart. It provides your doctor with information about the size and shape of your heart and how well your heart?s chambers and valves are working. This procedure takes approximately one hour. There are no restrictions for this procedure. ? Scheduled for 01/22/2022 @ 11:30am ? ? ?Follow-Up: ?At [facility], you and your health needs are our priority.  As part of our continuing mission to provide you with exceptional heart care, we have created designated Provider Care Teams.  These Care Teams include your primary Cardiologist (physician) and Advanced Practice Providers (APPs -  Physician Assistants and Nurse Practitioners) who all work together to provide you with the care you need, when you need it. ? ?Your next appointment:   ?February 28, 2022 @ 10:40 am  ? ?The format for your next appointment:   ?In Person ? ?Provider:   ?Minus Breeding, MD ? ? ? ?Thank you for choosing [facility]!! ? ? ?[phone] ?  ? ? ?

## 2021-10-20 DIAGNOSIS — I480 Paroxysmal atrial fibrillation: Secondary | ICD-10-CM | POA: Diagnosis not present

## 2021-10-20 DIAGNOSIS — I700 Atherosclerosis of aorta: Secondary | ICD-10-CM | POA: Diagnosis not present

## 2021-10-20 DIAGNOSIS — I10 Essential (primary) hypertension: Secondary | ICD-10-CM | POA: Diagnosis not present

## 2021-11-03 DIAGNOSIS — I480 Paroxysmal atrial fibrillation: Secondary | ICD-10-CM | POA: Diagnosis not present

## 2021-11-06 DIAGNOSIS — I4891 Unspecified atrial fibrillation: Secondary | ICD-10-CM | POA: Diagnosis not present

## 2021-11-06 DIAGNOSIS — Z79899 Other long term (current) drug therapy: Secondary | ICD-10-CM | POA: Diagnosis not present

## 2021-11-07 LAB — BASIC METABOLIC PANEL
BUN/Creatinine Ratio: 19 (ref 12–28)
BUN: 26 mg/dL (ref 8–27)
CO2: 23 mmol/L (ref 20–29)
Calcium: 9.4 mg/dL (ref 8.7–10.3)
Chloride: 103 mmol/L (ref 96–106)
Creatinine, Ser: 1.37 mg/dL — ABNORMAL HIGH (ref 0.57–1.00)
Glucose: 114 mg/dL — ABNORMAL HIGH (ref 70–99)
Potassium: 4.4 mmol/L (ref 3.5–5.2)
Sodium: 148 mmol/L — ABNORMAL HIGH (ref 134–144)
eGFR: 40 mL/min/{1.73_m2} — ABNORMAL LOW (ref >59)

## 2021-11-08 ENCOUNTER — Other Ambulatory Visit: Payer: Self-pay | Admitting: *Deleted

## 2021-11-08 DIAGNOSIS — N289 Disorder of kidney and ureter, unspecified: Secondary | ICD-10-CM

## 2021-11-10 IMAGING — CT CT ABD-PELV W/ CM
2 of 5 series · 15 of 46 positions shown, 17 images · IV contrast (OMNIPAQUE)
Comparison: None.

CLINICAL DATA: Follow-up cervical carcinoma. Blood in stool.
Increased left lower extremity swelling. Personal history of left
breast carcinoma.

EXAM:
CT ABDOMEN AND PELVIS WITH CONTRAST
TECHNIQUE: Multidetector CT imaging of the abdomen and pelvis was performed
using the standard protocol following bolus administration of
intravenous contrast.
CONTRAST:  80mL OMNIPAQUE IOHEXOL 300 MG/ML  SOLN

[Series 2: axial st · axial · 0.71mm/px · z∈[-494,-110]mm · 12 of 92 slices shown, 14 images]
[im 8/92  soft-tissue]
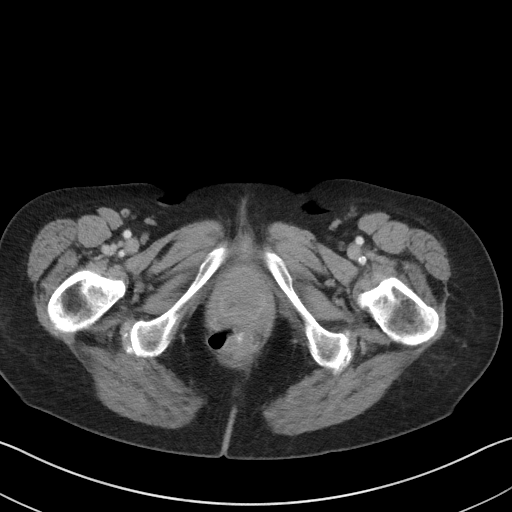
[im 8/92  bone]
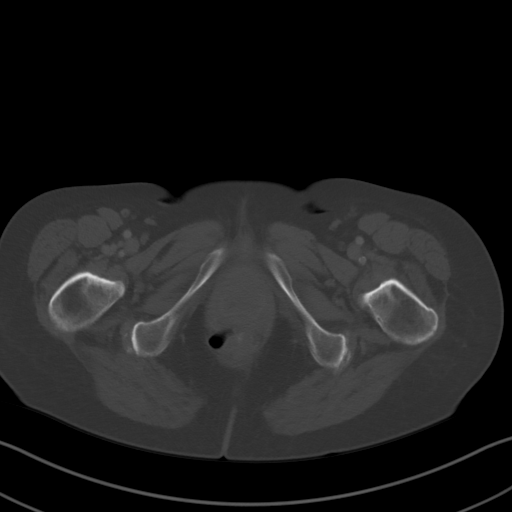
[im 15/92  soft-tissue]
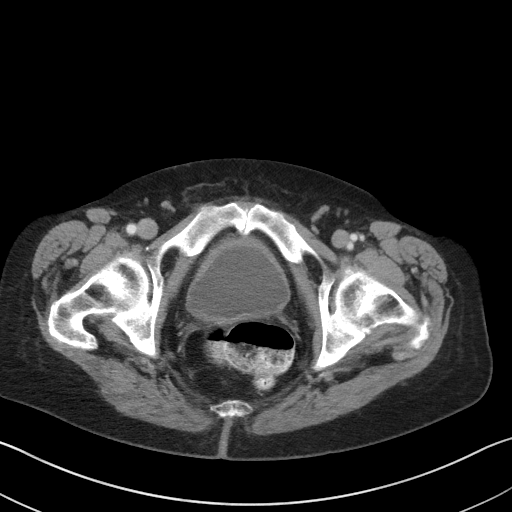
[im 22/92  soft-tissue]
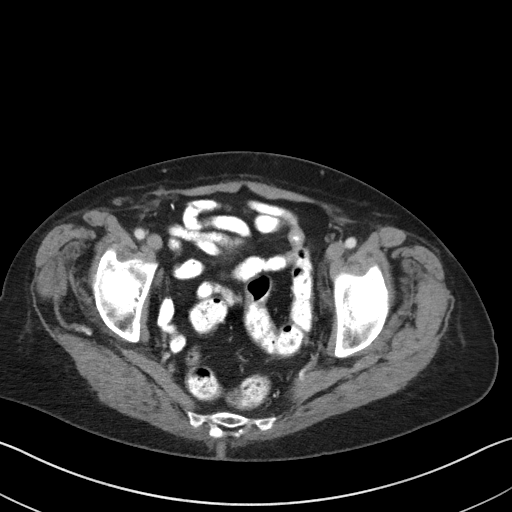
[im 29/92  soft-tissue]
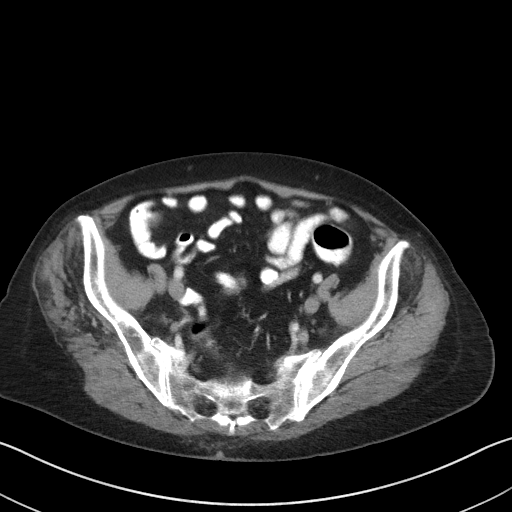
[im 36/92  soft-tissue]
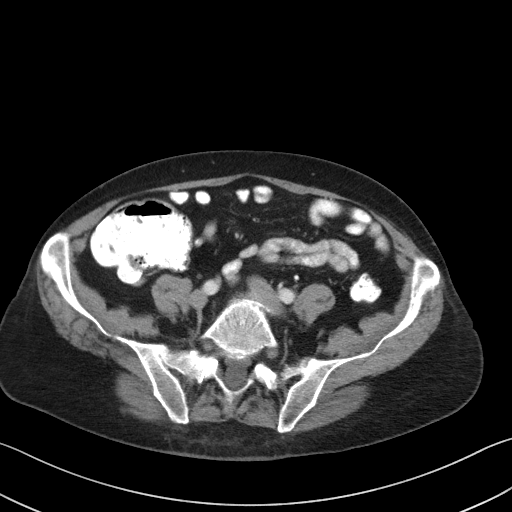
[im 43/92  soft-tissue]
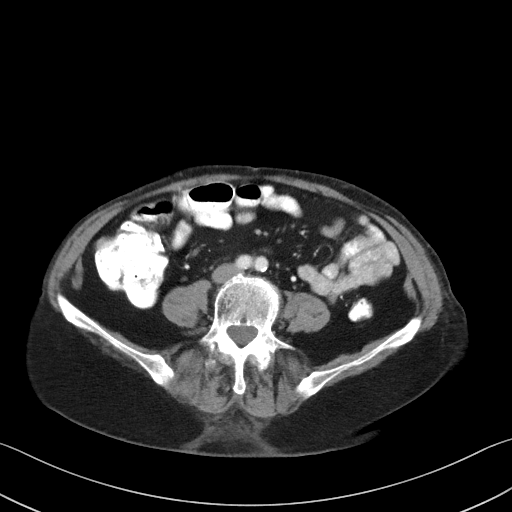
[im 50/92  soft-tissue]
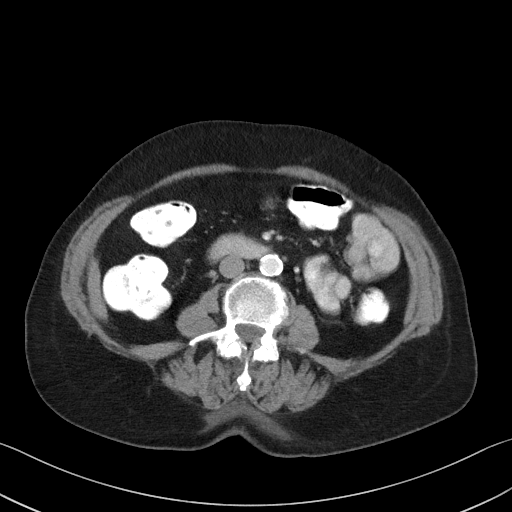
[im 57/92  soft-tissue]
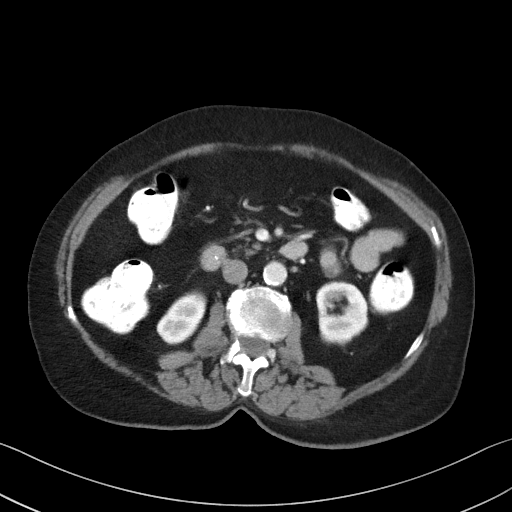
[im 64/92  soft-tissue]
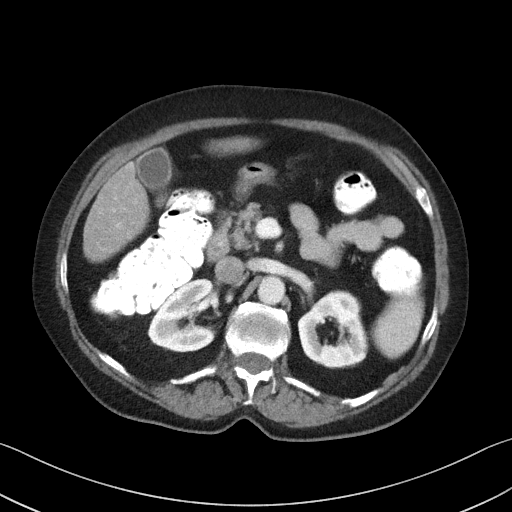
[im 64/92  bone]
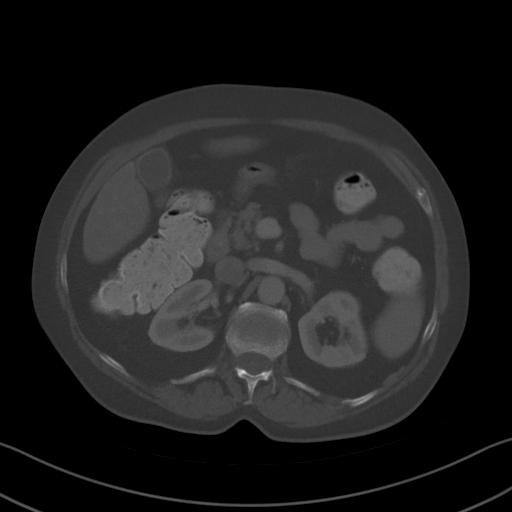
[im 71/92  soft-tissue]
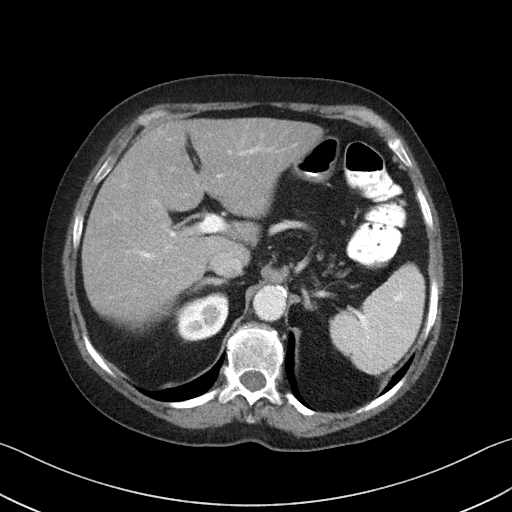
[im 78/92  soft-tissue]
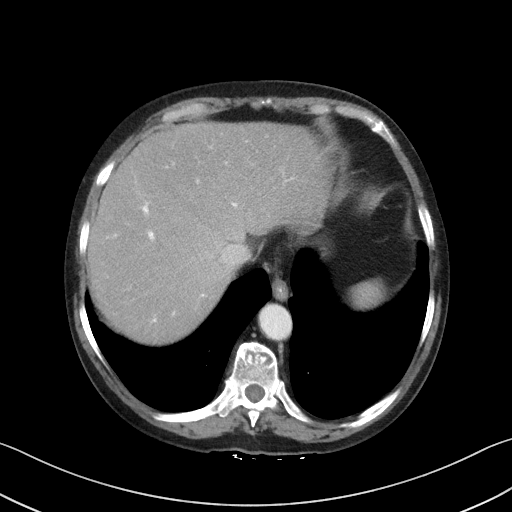
[im 85/92  soft-tissue]
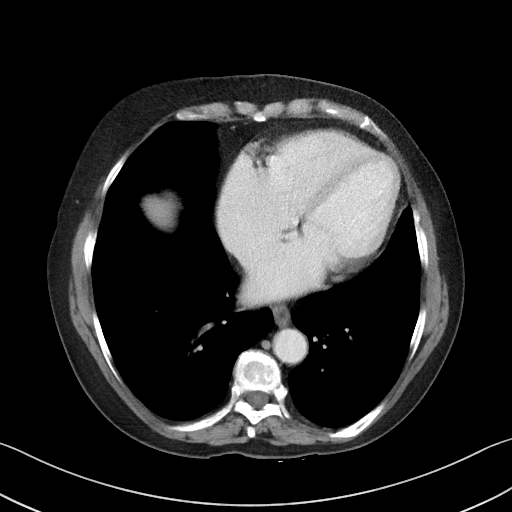

[Series 4: coronal st · coronal · 0.73mm/px · 3 of 85 slices shown]
[im 29/85  soft-tissue]
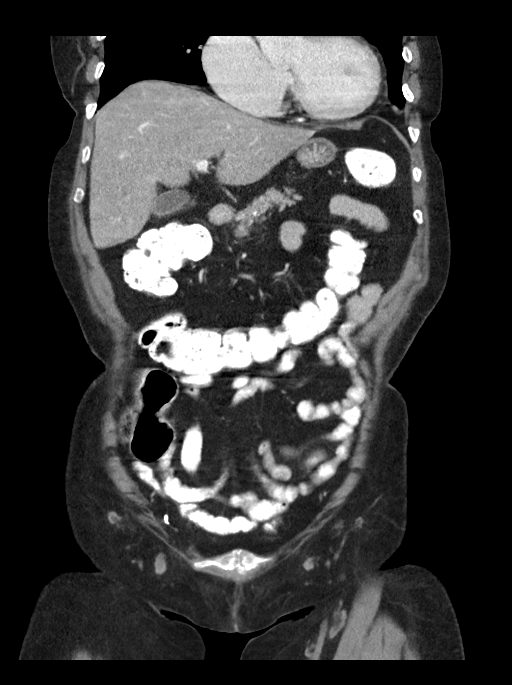
[im 38/85  soft-tissue]
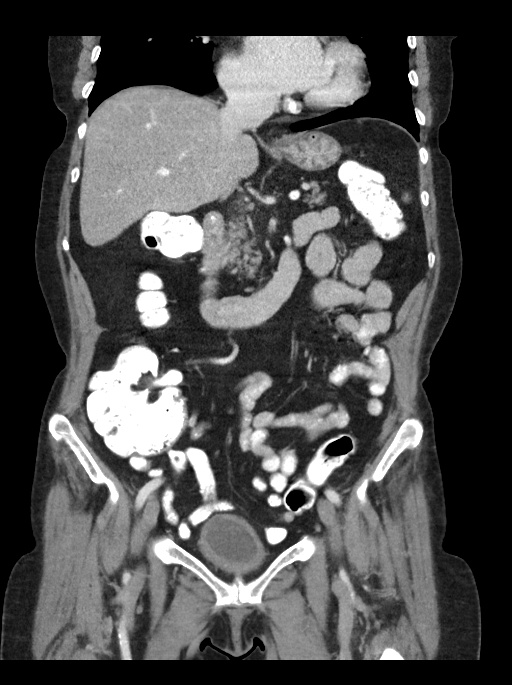
[im 47/85  soft-tissue]
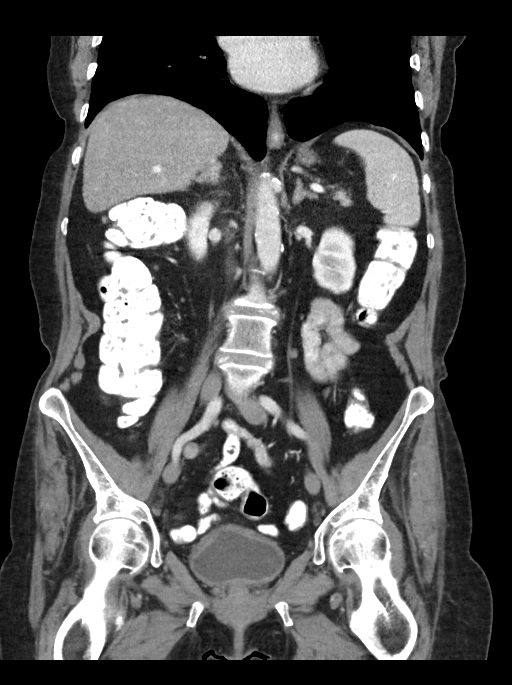

[15 of 46 positions shown; findings below may reference images not displayed]

FINDINGS: Lower Chest: No acute findings.

Hepatobiliary: No hepatic masses identified. Mild diffuse hepatic
steatosis. Gallbladder is unremarkable. No evidence of biliary
ductal dilatation.

Pancreas:  No mass or inflammatory changes.

Spleen: Within normal limits in size and appearance.

Adrenals/Urinary Tract: No masses identified. No evidence of
hydronephrosis. Mild diffuse bladder wall thickening is seen, which
may be seen with cystitis or neurogenic bladder.

Stomach/Bowel: No evidence of obstruction, inflammatory process or
abnormal fluid collections.

Vascular/Lymphatic: No pathologically enlarged lymph nodes. No
abdominal aortic aneurysm. Aortic atherosclerosis incidentally
noted.

Reproductive: Prior hysterectomy noted. Adnexal regions are
unremarkable in appearance.

Other:  None.

Musculoskeletal:  No suspicious bone lesions identified.
IMPRESSION: 1. Mild diffuse bladder wall thickening, which may be due to
cystitis or neurogenic bladder.
2. No evidence of recurrent or metastatic carcinoma within the
abdomen or pelvis.
3. Hepatic steatosis.

Aortic Atherosclerosis ([id number]).

## 2021-11-13 DIAGNOSIS — Z8541 Personal history of malignant neoplasm of cervix uteri: Secondary | ICD-10-CM | POA: Diagnosis not present

## 2021-11-13 DIAGNOSIS — Z6823 Body mass index (BMI) 23.0-23.9, adult: Secondary | ICD-10-CM | POA: Diagnosis not present

## 2021-11-13 DIAGNOSIS — Z779 Other contact with and (suspected) exposures hazardous to health: Secondary | ICD-10-CM | POA: Diagnosis not present

## 2021-11-13 DIAGNOSIS — Z01419 Encounter for gynecological examination (general) (routine) without abnormal findings: Secondary | ICD-10-CM | POA: Diagnosis not present

## 2021-11-13 DIAGNOSIS — Z01411 Encounter for gynecological examination (general) (routine) with abnormal findings: Secondary | ICD-10-CM | POA: Diagnosis not present

## 2021-11-13 DIAGNOSIS — Z124 Encounter for screening for malignant neoplasm of cervix: Secondary | ICD-10-CM | POA: Diagnosis not present

## 2021-11-13 DIAGNOSIS — Z90711 Acquired absence of uterus with remaining cervical stump: Secondary | ICD-10-CM | POA: Diagnosis not present

## 2021-11-17 DIAGNOSIS — I700 Atherosclerosis of aorta: Secondary | ICD-10-CM | POA: Diagnosis not present

## 2021-11-17 DIAGNOSIS — I480 Paroxysmal atrial fibrillation: Secondary | ICD-10-CM | POA: Diagnosis not present

## 2021-11-17 DIAGNOSIS — I10 Essential (primary) hypertension: Secondary | ICD-10-CM | POA: Diagnosis not present

## 2021-11-24 DIAGNOSIS — I480 Paroxysmal atrial fibrillation: Secondary | ICD-10-CM | POA: Diagnosis not present

## 2021-11-29 ENCOUNTER — Other Ambulatory Visit: Payer: Self-pay | Admitting: Cardiology

## 2021-12-06 DIAGNOSIS — N289 Disorder of kidney and ureter, unspecified: Secondary | ICD-10-CM | POA: Diagnosis not present

## 2021-12-07 LAB — BASIC METABOLIC PANEL
BUN/Creatinine Ratio: 23 (ref 12–28)
BUN: 24 mg/dL (ref 8–27)
CO2: 26 mmol/L (ref 20–29)
Calcium: 9.3 mg/dL (ref 8.7–10.3)
Chloride: 101 mmol/L (ref 96–106)
Creatinine, Ser: 1.04 mg/dL — ABNORMAL HIGH (ref 0.57–1.00)
Glucose: 75 mg/dL (ref 70–99)
Potassium: 4.3 mmol/L (ref 3.5–5.2)
Sodium: 143 mmol/L (ref 134–144)
eGFR: 55 mL/min/{1.73_m2} — ABNORMAL LOW (ref >59)

## 2021-12-12 ENCOUNTER — Ambulatory Visit: Payer: Medicare Other | Admitting: Cardiology

## 2021-12-15 ENCOUNTER — Telehealth: Payer: Self-pay | Admitting: Cardiology

## 2021-12-15 DIAGNOSIS — Z79899 Other long term (current) drug therapy: Secondary | ICD-10-CM

## 2021-12-15 NOTE — Telephone Encounter (Signed)
Returned call to patient and advised her that her recent lab work was stable. Patient has an appointment scheduled with Dr. [name] in August and would like to know if she needs repeat labs before this. Advised patient I would forward message over to Dr. [name] for him to review. Patient verbalized understanding.

## 2021-12-15 NOTE — Telephone Encounter (Signed)
Pt is calling ask if she needs to have more labs done. Req call back to discuss when she should schedule her next labs. Please advise.

## 2021-12-19 NOTE — Telephone Encounter (Signed)
BMET order placed and mailed to patient with instructions.   Attempted to call patient to make her aware, unable to reach. Left VM stating that labs have been ordered and have completed prior to next OV with Dr. [name]. Left call back number if needed.

## 2021-12-20 ENCOUNTER — Other Ambulatory Visit: Payer: Self-pay | Admitting: Cardiology

## 2021-12-20 DIAGNOSIS — I10 Essential (primary) hypertension: Secondary | ICD-10-CM | POA: Diagnosis not present

## 2021-12-20 DIAGNOSIS — I480 Paroxysmal atrial fibrillation: Secondary | ICD-10-CM | POA: Diagnosis not present

## 2021-12-22 DIAGNOSIS — I480 Paroxysmal atrial fibrillation: Secondary | ICD-10-CM | POA: Diagnosis not present

## 2021-12-27 DIAGNOSIS — I700 Atherosclerosis of aorta: Secondary | ICD-10-CM | POA: Diagnosis not present

## 2021-12-27 DIAGNOSIS — M172 Bilateral post-traumatic osteoarthritis of knee: Secondary | ICD-10-CM | POA: Diagnosis not present

## 2021-12-27 DIAGNOSIS — L570 Actinic keratosis: Secondary | ICD-10-CM | POA: Diagnosis not present

## 2021-12-27 DIAGNOSIS — I10 Essential (primary) hypertension: Secondary | ICD-10-CM | POA: Diagnosis not present

## 2021-12-27 DIAGNOSIS — I480 Paroxysmal atrial fibrillation: Secondary | ICD-10-CM | POA: Diagnosis not present

## 2022-01-19 DIAGNOSIS — I480 Paroxysmal atrial fibrillation: Secondary | ICD-10-CM | POA: Diagnosis not present

## 2022-01-22 ENCOUNTER — Ambulatory Visit (HOSPITAL_COMMUNITY)
Admission: RE | Admit: 2022-01-22 | Discharge: 2022-01-22 | Disposition: A | Discharge status: Home / Self Care | Payer: Medicare Other | Source: Ambulatory Visit | Attending: Cardiology | Admitting: Cardiology

## 2022-01-22 DIAGNOSIS — I4891 Unspecified atrial fibrillation: Secondary | ICD-10-CM | POA: Diagnosis not present

## 2022-01-22 DIAGNOSIS — R0602 Shortness of breath: Secondary | ICD-10-CM | POA: Diagnosis not present

## 2022-01-22 LAB — ECHOCARDIOGRAM COMPLETE
Area-P 1/2: 5.84 cm2
S' Lateral: 3.5 cm

## 2022-01-22 NOTE — Progress Notes (Signed)
*  PRELIMINARY RESULTS* Echocardiogram 2D Echocardiogram has been performed.  Pam [name] 01/22/2022, 12:45 PM

## 2022-01-26 ENCOUNTER — Encounter: Payer: Self-pay | Admitting: *Deleted

## 2022-02-14 DIAGNOSIS — Z79899 Other long term (current) drug therapy: Secondary | ICD-10-CM | POA: Diagnosis not present

## 2022-02-15 ENCOUNTER — Encounter: Payer: Self-pay | Admitting: *Deleted

## 2022-02-15 LAB — BASIC METABOLIC PANEL
BUN/Creatinine Ratio: 14 (ref 12–28)
BUN: 19 mg/dL (ref 8–27)
CO2: 26 mmol/L (ref 20–29)
Calcium: 9.5 mg/dL (ref 8.7–10.3)
Chloride: 102 mmol/L (ref 96–106)
Creatinine, Ser: 1.35 mg/dL — ABNORMAL HIGH (ref 0.57–1.00)
Glucose: 116 mg/dL — ABNORMAL HIGH (ref 70–99)
Potassium: 4.8 mmol/L (ref 3.5–5.2)
Sodium: 144 mmol/L (ref 134–144)
eGFR: 40 mL/min/{1.73_m2} — ABNORMAL LOW (ref >59)

## 2022-02-16 DIAGNOSIS — I480 Paroxysmal atrial fibrillation: Secondary | ICD-10-CM | POA: Diagnosis not present

## 2022-02-18 DIAGNOSIS — I10 Essential (primary) hypertension: Secondary | ICD-10-CM | POA: Diagnosis not present

## 2022-02-18 DIAGNOSIS — I480 Paroxysmal atrial fibrillation: Secondary | ICD-10-CM | POA: Diagnosis not present

## 2022-02-18 DIAGNOSIS — I700 Atherosclerosis of aorta: Secondary | ICD-10-CM | POA: Diagnosis not present

## 2022-02-19 DIAGNOSIS — I480 Paroxysmal atrial fibrillation: Secondary | ICD-10-CM | POA: Diagnosis not present

## 2022-02-19 DIAGNOSIS — I700 Atherosclerosis of aorta: Secondary | ICD-10-CM | POA: Diagnosis not present

## 2022-02-19 DIAGNOSIS — I10 Essential (primary) hypertension: Secondary | ICD-10-CM | POA: Diagnosis not present

## 2022-02-21 ENCOUNTER — Other Ambulatory Visit: Payer: Self-pay | Admitting: Cardiology

## 2022-02-26 DIAGNOSIS — I517 Cardiomegaly: Secondary | ICD-10-CM | POA: Insufficient documentation

## 2022-02-26 DIAGNOSIS — D6869 Other thrombophilia: Secondary | ICD-10-CM | POA: Insufficient documentation

## 2022-02-26 NOTE — Progress Notes (Unsigned)
She has had    Cardiology Office Note   Date:  02/28/2022   ID:  Pam [name], Pam [name] 09-26-1940, MRN [id number]  PCP:  Neale Burly, MD  Cardiologist:   Minus Breeding, MD  Chief Complaint  Patient presents with   Atrial Fibrillation   Shortness of Breath     History of Present Illness: Pam [name] is a 81 y.o. female who was referred by Neale Burly, MD for evaluation of atrial fib.   Echo was unremarkable. She had cardioversion but only stayed in sinus rhythm for about two days.   She went back into atrial fib.  She is being managed with rate control.    Since I last saw her she has had no new cardiovascular complaints but she still getting dyspneic with exertion.  This happens rarely after she eats any meals.  She is okay in the morning before she eats and then she gets short of breath after that.  She does not take a salt load.  She is not describing PND or orthopnea.  She does not check her oxygen saturation when she is feeling like this and she says it is in the 90s.  She is not describing any chest pressure, neck or arm discomfort.  She is not having any weight gain.  She did have some mild improvement with diuretic.  She had a BNP last year that was 2185.    Past Medical History:  Diagnosis Date   Arthritis    Atrial fibrillation ([place])    Cancer [facility])    BREAST CANCER / CERVICAL CANCER    Cervical cancer ([place])    Colitis    Dysrhythmia    History of breast cancer 2006   Dr. Sonny Dandy oncologist - left breast   History of gout    Hyperlipidemia    Self-catheterizes urinary bladder    Urinary retention Sept 2016   s/p hysterectomy    Past Surgical History:  Procedure Laterality Date   BIOPSY  09/09/2020   Procedure: BIOPSY;  Surgeon: Daneil Dolin, MD;  [facility];  Service: Endoscopy;;   BREAST SURGERY     CARDIOVERSION N/A 06/03/2019   Procedure: CARDIOVERSION;  Surgeon: Jerline Pain, MD;  Location: [place];  Service: Cardiovascular;   Laterality: N/A;   CATARACT EXTRACTION W/ INTRAOCULAR LENS  IMPLANT, BILATERAL     COLONOSCOPY WITH PROPOFOL N/A 09/09/2020   Procedure: COLONOSCOPY WITH PROPOFOL;  Surgeon: Daneil Dolin, MD;  [facility];  Service: Endoscopy;  Laterality: N/A;  2:45pm   EYE SURGERY     INGUINAL HERNIA REPAIR Right 12/14/2015   Procedure: LAPAROSCOPIC AND OPEN RIGHT  INGUINAL HERNIA;  Surgeon: Johnathan Hausen, MD;  [facility];  Service: General;  Laterality: Right;   KNEE ARTHROSCOPY Left    MASTECTOMY Left 2006   MASTECTOMY WITH AXILLARY LYMPH NODE DISSECTION Left    ROBOTIC ASSISTED TOTAL HYSTERECTOMY WITH BILATERAL SALPINGO OOPHERECTOMY Bilateral 04/07/2015   Procedure: ROBOTIC ASSISTED RADICAL HYSTERECTOMY BILATERAL SALPINGO OOPHORECTOMY BILATERAL SENTINEL LYMPHADENETOMY;  Surgeon: Everitt Amber, MD;  [facility];  Service: Gynecology;  Laterality: Bilateral;   TOTAL KNEE ARTHROPLASTY Left 07/13/2019   Procedure: LEFT TOTAL KNEE ARTHROPLASTY-CEMENTED;  Surgeon: Marybelle Killings, MD;  Location: [place];  Service: Orthopedics;  Laterality: Left;   TOTAL KNEE ARTHROPLASTY Right 02/15/2020   Procedure: RIGHT TOTAL KNEE ARTHROPLASTY;  Surgeon: Marybelle Killings, MD;  Location: [place];  Service: Orthopedics;  Laterality: Right;  TUBAL LIGATION       Current Outpatient Medications  Medication Sig Dispense Refill   bisacodyl (DULCOLAX) 5 MG EC tablet Take 5 mg by mouth daily as needed for mild constipation.      diltiazem (TIAZAC) 360 MG 24 hr capsule Take 1 capsule by mouth once daily 90 capsule 3   fexofenadine (ALLEGRA) 180 MG tablet Take 180 mg by mouth daily.     furosemide (LASIX) 20 MG tablet Take 2 tablets by mouth once daily 180 tablet 2   gabapentin (NEURONTIN) 300 MG capsule Take 300 mg by mouth at bedtime.     metoprolol tartrate (LOPRESSOR) 25 MG tablet Take 1 tablet by mouth twice daily 180 tablet 0   rOPINIRole (REQUIP) 1 MG tablet Take 1 mg by mouth at bedtime.  0   traMADol (ULTRAM)  50 MG tablet Take 50 mg by mouth every 12 (twelve) hours as needed (pain).     warfarin (COUMADIN) 5 MG tablet Take 5 mg by mouth every other day. In the morning (alternates with 7.5 mg tablet)     warfarin (COUMADIN) 7.5 MG tablet Take 7.5 mg by mouth every other day. In the morning (alternates with 5 mg tablet)     No current facility-administered medications for this visit.    Allergies:   Aspirin, Ciprofloxacin, Flagyl [metronidazole], and Penicillins    ROS:  Please see the history of present illness.   Otherwise, review of systems are positive for none.   All other systems are reviewed and negative.    PHYSICAL EXAM: VS:  BP 98/62   Pulse 68   Ht '5\' 3"'$  (1.6 m)   Wt 136 lb (61.7 kg)   BMI 24.09 kg/m  , BMI Body mass index is 24.09 kg/m. GENERAL:  Well appearing NECK:  No jugular venous distention, waveform within normal limits, carotid upstroke brisk and symmetric, no bruits, no thyromegaly LUNGS:  Clear to auscultation bilaterally CHEST:  Unremarkable HEART:  PMI not displaced or sustained,S1 and S2 within normal limits, no S3, no clicks, no rubs, no murmurs, irregular  ABD:  Flat, positive bowel sounds normal in frequency in pitch, no bruits, no rebound, no guarding, no midline pulsatile mass, no hepatomegaly, no splenomegaly EXT:  2 plus pulses throughout, no edema, no cyanosis no clubbing   EKG:  EKG is  ordered today. Atrial fibrillation, rate 68, axis within normal limits, intervals within normal limits, low voltage in the chest leads.   Recent Labs: 02/14/2022: BUN 19; Creatinine, Ser 1.35; Potassium 4.8; Sodium 144    Lipid Panel No results found for: "CHOL", "TRIG", "HDL", "CHOLHDL", "VLDL", "LDLCALC", "LDLDIRECT"    Wt Readings from Last 3 Encounters:  02/28/22 136 lb (61.7 kg)  10/18/21 141 lb (64 kg)  06/13/21 141 lb (64 kg)      Other studies Reviewed: Additional studies/ records that were reviewed today include: Labs Review of the above records  demonstrates:  Please see elsewhere in the note.     ASSESSMENT AND PLAN:  ATRIAL FIB:   Pam [name] has a CHA2DS2 - VASc score of 3.   I do not know that this is contributing at all to her symptoms but she has failed cardioversion and I might consider more aggressive attempts at rhythm control going forward.  SOB:      Perfusion study was negative for ischemia.  The only objective findings thus far is the atrial fibrillation and the elevated BNP.  I am going to order  a cardiopulmonary stress test.  She could have some variant of ischemia exacerbated by eating and increased splanchnic blood flow.  I might ultimately do right and left heart cath with evaluation for microvascular ischemia.  CM: Her EF was low normal.  She had no evidence of pulmonary artery systolic pressure elevation.  The right atrium was mildly enlarged.  There is possibly a PFO and again she might well need right and left heart catheterization to look for microvascular circulation as well as perhaps shunting.    Current medicines are reviewed at length with the patient today.  The patient does not have concerns regarding medicines.  The following changes have been made:  None  Labs/ tests ordered today include:   None  Orders Placed This Encounter  Procedures   Cardiopulmonary exercise test   Cardiac Stress Test: Informed Consent Details: Physician/Practitioner Attestation; Transcribe to consent form and obtain patient signature   EKG 12-Lead     Disposition:   FU with me in 3 months.    Signed, Minus Breeding, MD  02/28/2022 11:30 AM    [facility]

## 2022-02-27 ENCOUNTER — Other Ambulatory Visit: Payer: Self-pay | Admitting: Cardiology

## 2022-02-28 ENCOUNTER — Ambulatory Visit (INDEPENDENT_AMBULATORY_CARE_PROVIDER_SITE_OTHER): Payer: Medicare Other | Admitting: Cardiology

## 2022-02-28 ENCOUNTER — Encounter: Payer: Self-pay | Admitting: Cardiology

## 2022-02-28 VITALS — BP 98/62 | HR 68 | Ht 63.0 in | Wt 136.0 lb

## 2022-02-28 DIAGNOSIS — I482 Chronic atrial fibrillation, unspecified: Secondary | ICD-10-CM | POA: Diagnosis not present

## 2022-02-28 DIAGNOSIS — R0602 Shortness of breath: Secondary | ICD-10-CM

## 2022-02-28 DIAGNOSIS — I517 Cardiomegaly: Secondary | ICD-10-CM | POA: Diagnosis not present

## 2022-02-28 DIAGNOSIS — D6869 Other thrombophilia: Secondary | ICD-10-CM | POA: Diagnosis not present

## 2022-02-28 NOTE — Patient Instructions (Signed)
Medication Instructions:  The current medical regimen is effective;  continue present plan and medications.  *If you need a refill on your cardiac medications before your next appointment, please call your pharmacy*  Testing/Procedures: Your physician has recommended that you have a cardiopulmonary stress test (CPX). CPX testing is a non-invasive measurement of heart and lung function. It replaces a traditional treadmill stress test. This type of test provides a tremendous amount of information that relates not only to your present condition but also for future outcomes. This test combines measurements of you ventilation, respiratory gas exchange in the lungs, electrocardiogram (EKG), blood pressure and physical response before, during, and following an exercise protocol. You will be contacted to be scheduled and where to report for check in.  This testing is completed at [place]: At [facility], you and your health needs are our priority.  As part of our continuing mission to provide you with exceptional heart care, we have created designated Provider Care Teams.  These Care Teams include your primary Cardiologist (physician) and Advanced Practice Providers (APPs -  Physician Assistants and Nurse Practitioners) who all work together to provide you with the care you need, when you need it.  We recommend signing up for the patient portal called "MyChart".  Sign up information is provided on this After Visit Summary.  MyChart is used to connect with patients for Virtual Visits (Telemedicine).  Patients are able to view lab/test results, encounter notes, upcoming appointments, etc.  Non-urgent messages can be sent to your provider as well.   To learn more about what you can do with MyChart, go to [email or web address].    Your next appointment:   3 month(s)  The format for your next appointment:   In Person  Provider:   Minus Breeding, MD {   Important Information About  [place]

## 2022-03-05 ENCOUNTER — Ambulatory Visit (HOSPITAL_COMMUNITY): Payer: Medicare Other | Attending: Cardiology

## 2022-03-05 DIAGNOSIS — I517 Cardiomegaly: Secondary | ICD-10-CM

## 2022-03-05 DIAGNOSIS — R0600 Dyspnea, unspecified: Secondary | ICD-10-CM | POA: Diagnosis not present

## 2022-03-05 DIAGNOSIS — R0602 Shortness of breath: Secondary | ICD-10-CM

## 2022-03-05 DIAGNOSIS — I482 Chronic atrial fibrillation, unspecified: Secondary | ICD-10-CM

## 2022-03-16 DIAGNOSIS — I480 Paroxysmal atrial fibrillation: Secondary | ICD-10-CM | POA: Diagnosis not present

## 2022-03-22 ENCOUNTER — Other Ambulatory Visit: Payer: Self-pay | Admitting: Cardiology

## 2022-04-05 DIAGNOSIS — L570 Actinic keratosis: Secondary | ICD-10-CM | POA: Diagnosis not present

## 2022-04-05 DIAGNOSIS — I10 Essential (primary) hypertension: Secondary | ICD-10-CM | POA: Diagnosis not present

## 2022-04-05 DIAGNOSIS — I480 Paroxysmal atrial fibrillation: Secondary | ICD-10-CM | POA: Diagnosis not present

## 2022-04-05 DIAGNOSIS — M172 Bilateral post-traumatic osteoarthritis of knee: Secondary | ICD-10-CM | POA: Diagnosis not present

## 2022-04-05 DIAGNOSIS — I700 Atherosclerosis of aorta: Secondary | ICD-10-CM | POA: Diagnosis not present

## 2022-04-20 DIAGNOSIS — I480 Paroxysmal atrial fibrillation: Secondary | ICD-10-CM | POA: Diagnosis not present

## 2022-05-18 DIAGNOSIS — Z7901 Long term (current) use of anticoagulants: Secondary | ICD-10-CM | POA: Diagnosis not present

## 2022-05-18 DIAGNOSIS — Z1231 Encounter for screening mammogram for malignant neoplasm of breast: Secondary | ICD-10-CM | POA: Diagnosis not present

## 2022-06-08 ENCOUNTER — Telehealth: Payer: Self-pay | Admitting: Surgery

## 2022-06-08 NOTE — Telephone Encounter (Signed)
[facility] faxed medical supplies refill request to our office. Patient saw Dr Denman George and was discharged from our care in December 2021. [facility] informed they will need to send refill request to the patient's PCP or other provider that is currently following this patient.

## 2022-06-12 NOTE — Progress Notes (Unsigned)
Cardiology Office Note   Date:  06/13/2022   ID:  Brylie, Sneath January 28, 1941, MRN [id number]  PCP:  Neale Burly, MD  Cardiologist:   Minus Breeding, MD  Chief Complaint  Patient presents with   Atrial Fibrillation     History of Present Illness: Pam [name] is a 81 y.o. female who was referred by Neale Burly, MD for evaluation of atrial fib.   Echo was unremarkable. She had cardioversion but only stayed in sinus rhythm for about two days.   She went back into atrial fib.  She is being managed with rate control.  She has had increased SOB.  I sent her for a CPX which demonstrated evidence of diastolic dysfunction and chronotropic incompetence.  She has had a previously elevated BNP.  We have increased her Lasix and her creatinine has crept up but stabilized.  She is still short of breath walking mild to moderate distance on level ground.  She is not having any PND or orthopnea.  She does not really feel her palpitations.  She denies any presyncope or syncope.  She is not adding salt.  She has had no increased weight gain or lower extremity swelling.    Past Medical History:  Diagnosis Date   Arthritis    Atrial fibrillation ([place])    Cancer [facility])    BREAST CANCER / CERVICAL CANCER    Cervical cancer ([place])    Colitis    Dysrhythmia    History of breast cancer 2006   Dr. Sonny Dandy oncologist - left breast   History of gout    Hyperlipidemia    Self-catheterizes urinary bladder    Urinary retention Sept 2016   s/p hysterectomy    Past Surgical History:  Procedure Laterality Date   BIOPSY  09/09/2020   Procedure: BIOPSY;  Surgeon: Daneil Dolin, MD;  [facility];  Service: Endoscopy;;   BREAST SURGERY     CARDIOVERSION N/A 06/03/2019   Procedure: CARDIOVERSION;  Surgeon: Jerline Pain, MD;  Location: [place];  Service: Cardiovascular;  Laterality: N/A;   CATARACT EXTRACTION W/ INTRAOCULAR LENS  IMPLANT, BILATERAL     COLONOSCOPY WITH  PROPOFOL N/A 09/09/2020   Procedure: COLONOSCOPY WITH PROPOFOL;  Surgeon: Daneil Dolin, MD;  [facility];  Service: Endoscopy;  Laterality: N/A;  2:45pm   EYE SURGERY     INGUINAL HERNIA REPAIR Right 12/14/2015   Procedure: LAPAROSCOPIC AND OPEN RIGHT  INGUINAL HERNIA;  Surgeon: Johnathan Hausen, MD;  [facility];  Service: General;  Laterality: Right;   KNEE ARTHROSCOPY Left    MASTECTOMY Left 2006   MASTECTOMY WITH AXILLARY LYMPH NODE DISSECTION Left    ROBOTIC ASSISTED TOTAL HYSTERECTOMY WITH BILATERAL SALPINGO OOPHERECTOMY Bilateral 04/07/2015   Procedure: ROBOTIC ASSISTED RADICAL HYSTERECTOMY BILATERAL SALPINGO OOPHORECTOMY BILATERAL SENTINEL LYMPHADENETOMY;  Surgeon: Everitt Amber, MD;  [facility];  Service: Gynecology;  Laterality: Bilateral;   TOTAL KNEE ARTHROPLASTY Left 07/13/2019   Procedure: LEFT TOTAL KNEE ARTHROPLASTY-CEMENTED;  Surgeon: Marybelle Killings, MD;  Location: [place];  Service: Orthopedics;  Laterality: Left;   TOTAL KNEE ARTHROPLASTY Right 02/15/2020   Procedure: RIGHT TOTAL KNEE ARTHROPLASTY;  Surgeon: Marybelle Killings, MD;  Location: [place];  Service: Orthopedics;  Laterality: Right;   TUBAL LIGATION       Current Outpatient Medications  Medication Sig Dispense Refill   bisacodyl (DULCOLAX) 5 MG EC tablet Take 5 mg by mouth daily as  needed for mild constipation.      dapagliflozin propanediol (FARXIGA) 10 MG TABS tablet Take 1 tablet (10 mg total) by mouth daily before breakfast. 30 tablet 11   diltiazem (TIAZAC) 360 MG 24 hr capsule Take 1 capsule by mouth once daily 90 capsule 3   fexofenadine (ALLEGRA) 180 MG tablet Take 180 mg by mouth daily.     furosemide (LASIX) 20 MG tablet Take 2 tablets by mouth once daily 180 tablet 2   gabapentin (NEURONTIN) 300 MG capsule Take 300 mg by mouth at bedtime.     metoprolol tartrate (LOPRESSOR) 25 MG tablet Take 1 tablet by mouth twice daily 180 tablet 3   rOPINIRole (REQUIP) 1 MG tablet Take 1 mg by mouth at  bedtime.  0   traMADol (ULTRAM) 50 MG tablet Take 50 mg by mouth every 12 (twelve) hours as needed (pain).     warfarin (COUMADIN) 5 MG tablet Take 5 mg by mouth every other day. In the morning (alternates with 7.5 mg tablet)     warfarin (COUMADIN) 7.5 MG tablet Take 7.5 mg by mouth every other day. In the morning (alternates with 5 mg tablet)     No current facility-administered medications for this visit.    Allergies:   Aspirin, Ciprofloxacin, Flagyl [metronidazole], and Penicillins    ROS:  Please see the history of present illness.   Otherwise, review of systems are positive for none.   All other systems are reviewed and negative.    PHYSICAL EXAM: VS:  BP 121/72   Pulse 76   Ht '5\' 3"'$  (1.6 m)   Wt 138 lb (62.6 kg)   SpO2 96%   BMI 24.45 kg/m  , BMI Body mass index is 24.45 kg/m. GENERAL:  Well appearing NECK:  No jugular venous distention, waveform within normal limits, carotid upstroke brisk and symmetric, no bruits, no thyromegaly LUNGS:  Clear to auscultation bilaterally CHEST:  Unremarkable HEART:  PMI not displaced or sustained,S1 and S2 within normal limits, no S3,  no clicks, no rubs, no murmurs, irregular  ABD:  Flat, positive bowel sounds normal in frequency in pitch, no bruits, no rebound, no guarding, no midline pulsatile mass, no hepatomegaly, no splenomegaly EXT:  2 plus pulses throughout, no edema, no cyanosis no clubbing   EKG:  EKG is  ordered today. Atrial fibrillation, rate 68, axis within normal limits, intervals within normal limits, low voltage in the chest leads.   Recent Labs: 02/14/2022: BUN 19; Creatinine, Ser 1.35; Potassium 4.8; Sodium 144    Lipid Panel No results found for: "CHOL", "TRIG", "HDL", "CHOLHDL", "VLDL", "LDLCALC", "LDLDIRECT"    Wt Readings from Last 3 Encounters:  06/13/22 138 lb (62.6 kg)  02/28/22 136 lb (61.7 kg)  10/18/21 141 lb (64 kg)      Other studies Reviewed: Additional studies/ records that were reviewed  today include: Labs Review of the above records demonstrates:  Please see elsewhere in the note.     ASSESSMENT AND PLAN:  ATRIAL FIB:   Pam [name] has a CHA2DS2 - VASc score of 3.  She might want to switch back to Eliquis when her insurance changes and she will let me know.  SOB:      Perfusion study was negative for ischemia.   There may be some component of chronotropic incompetence but there is no other location for pacing.  We did walk her around and her oxygen saturations stayed in the 95% range despite her shortness of  breath.  Her heart rate did not go up with ambulation however.  The big objective finding is the elevated BNP and I think this is probably diastolic dysfunction swelling and start Farxiga 10 mg daily and see if she improves.  She like to get a basic metabolic profile in a couple of weeks.  We talked about salt restriction.  CM: Her EF was low normal.   If she has no improvement with the [place] and more strict salt restriction I might suggest a right and left heart cath.    Current medicines are reviewed at length with the patient today.  The patient does not have concerns regarding medicines.  The following changes have been made: As above  Labs/ tests ordered today include:     No orders of the defined types were placed in this encounter.    Disposition:   FU with me in 2 months.    Signed, Minus Breeding, MD  06/13/2022 11:17 AM    [employer]

## 2022-06-13 ENCOUNTER — Encounter: Payer: Self-pay | Admitting: Cardiology

## 2022-06-13 ENCOUNTER — Ambulatory Visit (INDEPENDENT_AMBULATORY_CARE_PROVIDER_SITE_OTHER): Payer: Medicare Other | Admitting: Cardiology

## 2022-06-13 ENCOUNTER — Telehealth: Payer: Self-pay | Admitting: Cardiology

## 2022-06-13 VITALS — BP 121/72 | HR 76 | Ht 63.0 in | Wt 138.0 lb

## 2022-06-13 DIAGNOSIS — I517 Cardiomegaly: Secondary | ICD-10-CM | POA: Diagnosis not present

## 2022-06-13 DIAGNOSIS — R0602 Shortness of breath: Secondary | ICD-10-CM | POA: Diagnosis not present

## 2022-06-13 DIAGNOSIS — I4819 Other persistent atrial fibrillation: Secondary | ICD-10-CM | POA: Diagnosis not present

## 2022-06-13 MED ORDER — DAPAGLIFLOZIN PROPANEDIOL 10 MG PO TABS: 10.0000 mg | ORAL_TABLET | Freq: Every day | ORAL | 11 refills | Status: DC

## 2022-06-13 NOTE — Telephone Encounter (Signed)
  Pt c/o medication issue:  1. Name of Medication:   dapagliflozin propanediol (FARXIGA) 10 MG TABS tablet    2. How are you currently taking this medication (dosage and times per day)? Take 1 tablet (10 mg total) by mouth daily before breakfast   3. Are you having a reaction (difficulty breathing--STAT)? No   4. What is your medication issue? Pt's daughter calling, she said she has some question about this new medication

## 2022-06-13 NOTE — Telephone Encounter (Signed)
Daughter asked for farxiga '10mg'$  daily samples. Two boxes set aside for patient. Lot #: [id number]; Exp; 11/19/24. She wants to pick them up next Tuesday.

## 2022-06-13 NOTE — Patient Instructions (Addendum)
Medication Instructions:  Please start Farxiga 10 mg a day. Continue all other medications as listed.  *If you need a refill on your cardiac medications before your next appointment, please call your pharmacy*  Follow-Up: At [facility], you and your health needs are our priority.  As part of our continuing mission to provide you with exceptional heart care, we have created designated Provider Care Teams.  These Care Teams include your primary Cardiologist (physician) and Advanced Practice Providers (APPs -  Physician Assistants and Nurse Practitioners) who all work together to provide you with the care you need, when you need it.  We recommend signing up for the patient portal called "MyChart".  Sign up information is provided on this After Visit Summary.  MyChart is used to connect with patients for Virtual Visits (Telemedicine).  Patients are able to view lab/test results, encounter notes, upcoming appointments, etc.  Non-urgent messages can be sent to your provider as well.   To learn more about what you can do with MyChart, go to [email or web address].    Your next appointment:   2 month(s)  The format for your next appointment:   In Person  Provider:   Minus Breeding, MD    Important Information About Sugar

## 2022-06-15 DIAGNOSIS — Z7901 Long term (current) use of anticoagulants: Secondary | ICD-10-CM | POA: Diagnosis not present

## 2022-06-20 IMAGING — CR DG CHEST 2V
2 series · 2 of 2 positions shown · non-contrast
Comparison: December 14, 2019

CLINICAL DATA: Preoperative assessment for total knee replacement.
History of breast carcinoma

EXAM:
CHEST - 2 VIEW

[w chest pa]
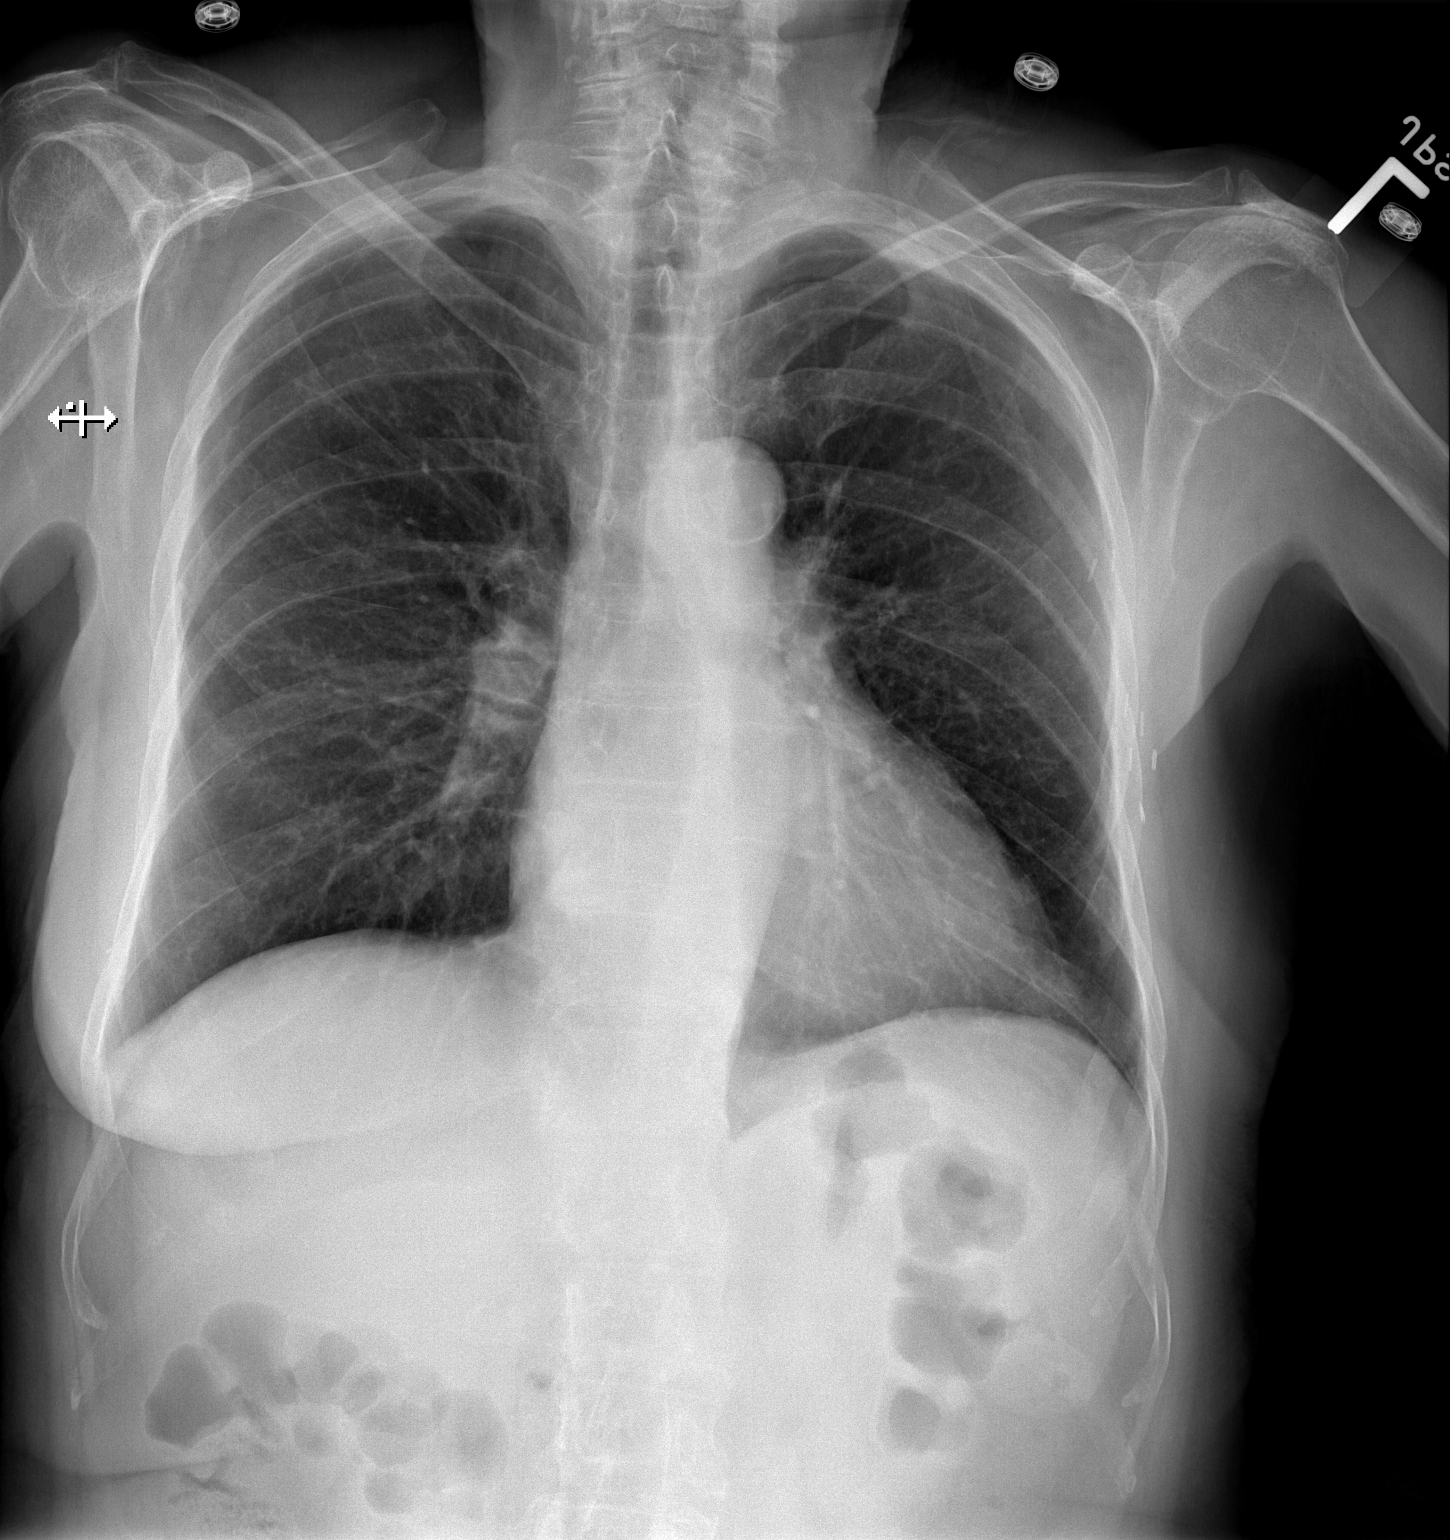

[w chest lat]
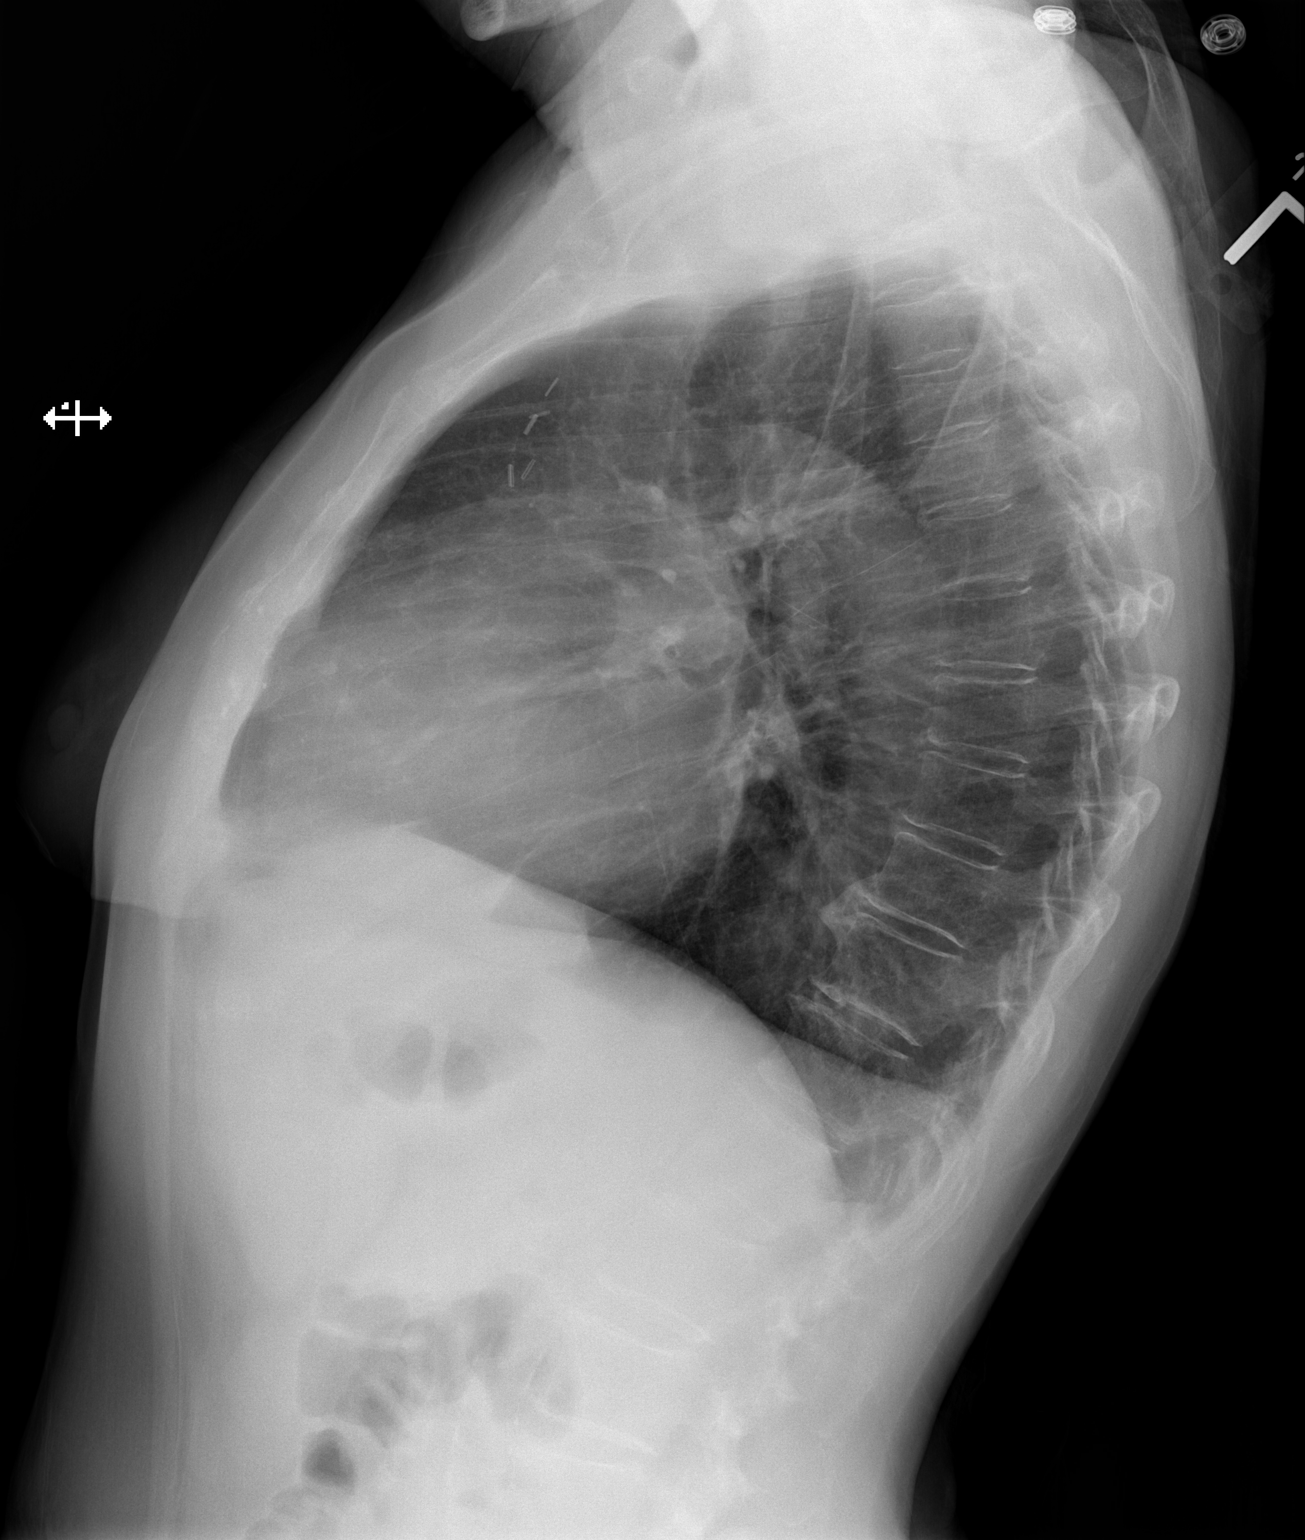

[2 of 2 positions shown; findings below may reference images not displayed]

FINDINGS: Lungs are clear. Heart size and pulmonary vascularity are normal. No
adenopathy. There is aortic atherosclerosis. Patient is status post
mastectomy on the left with surgical clips in left axilla. Old rib
trauma on the left. There is calcification in the right carotid
artery.
IMPRESSION: Lungs clear.  Heart size normal.  Status post left mastectomy.

Aortic Atherosclerosis ([id number]). Right carotid artery
calcification also noted.

## 2022-06-20 IMAGING — DX DG KNEE 1-2V*R*
2 series · 2 of 2 positions shown · non-contrast
Comparison: None.

CLINICAL DATA: Postop.

EXAM:
RIGHT KNEE - 1-2 VIEW

[knee ap]
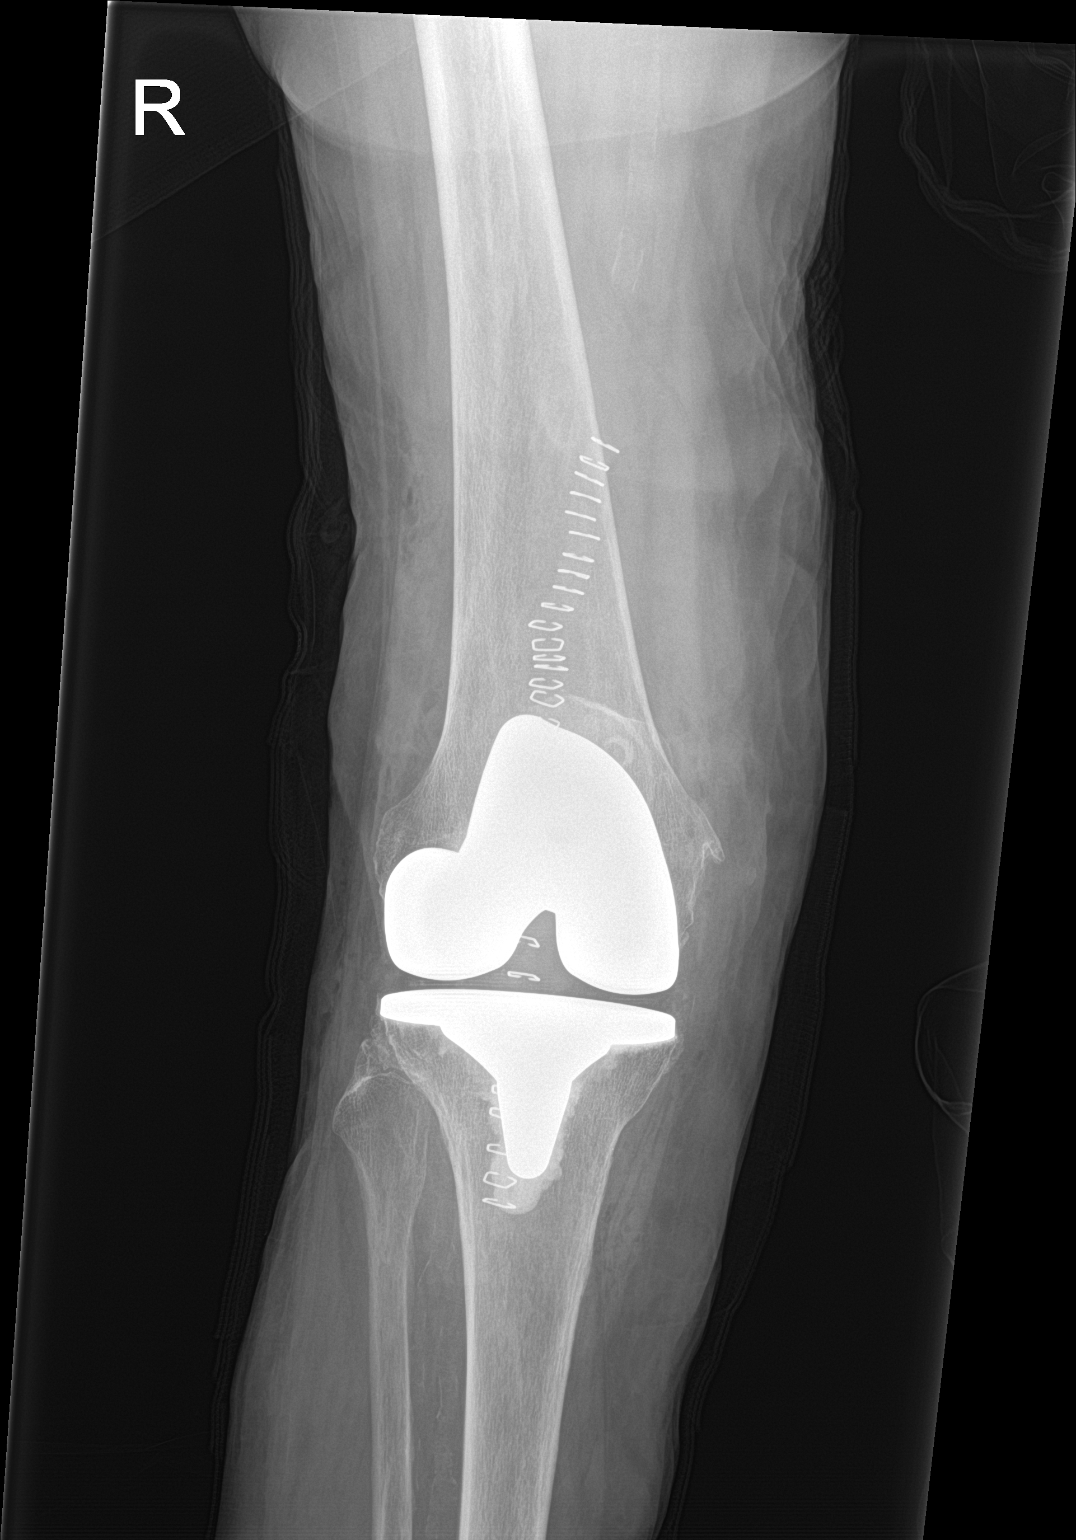

[knee lat]
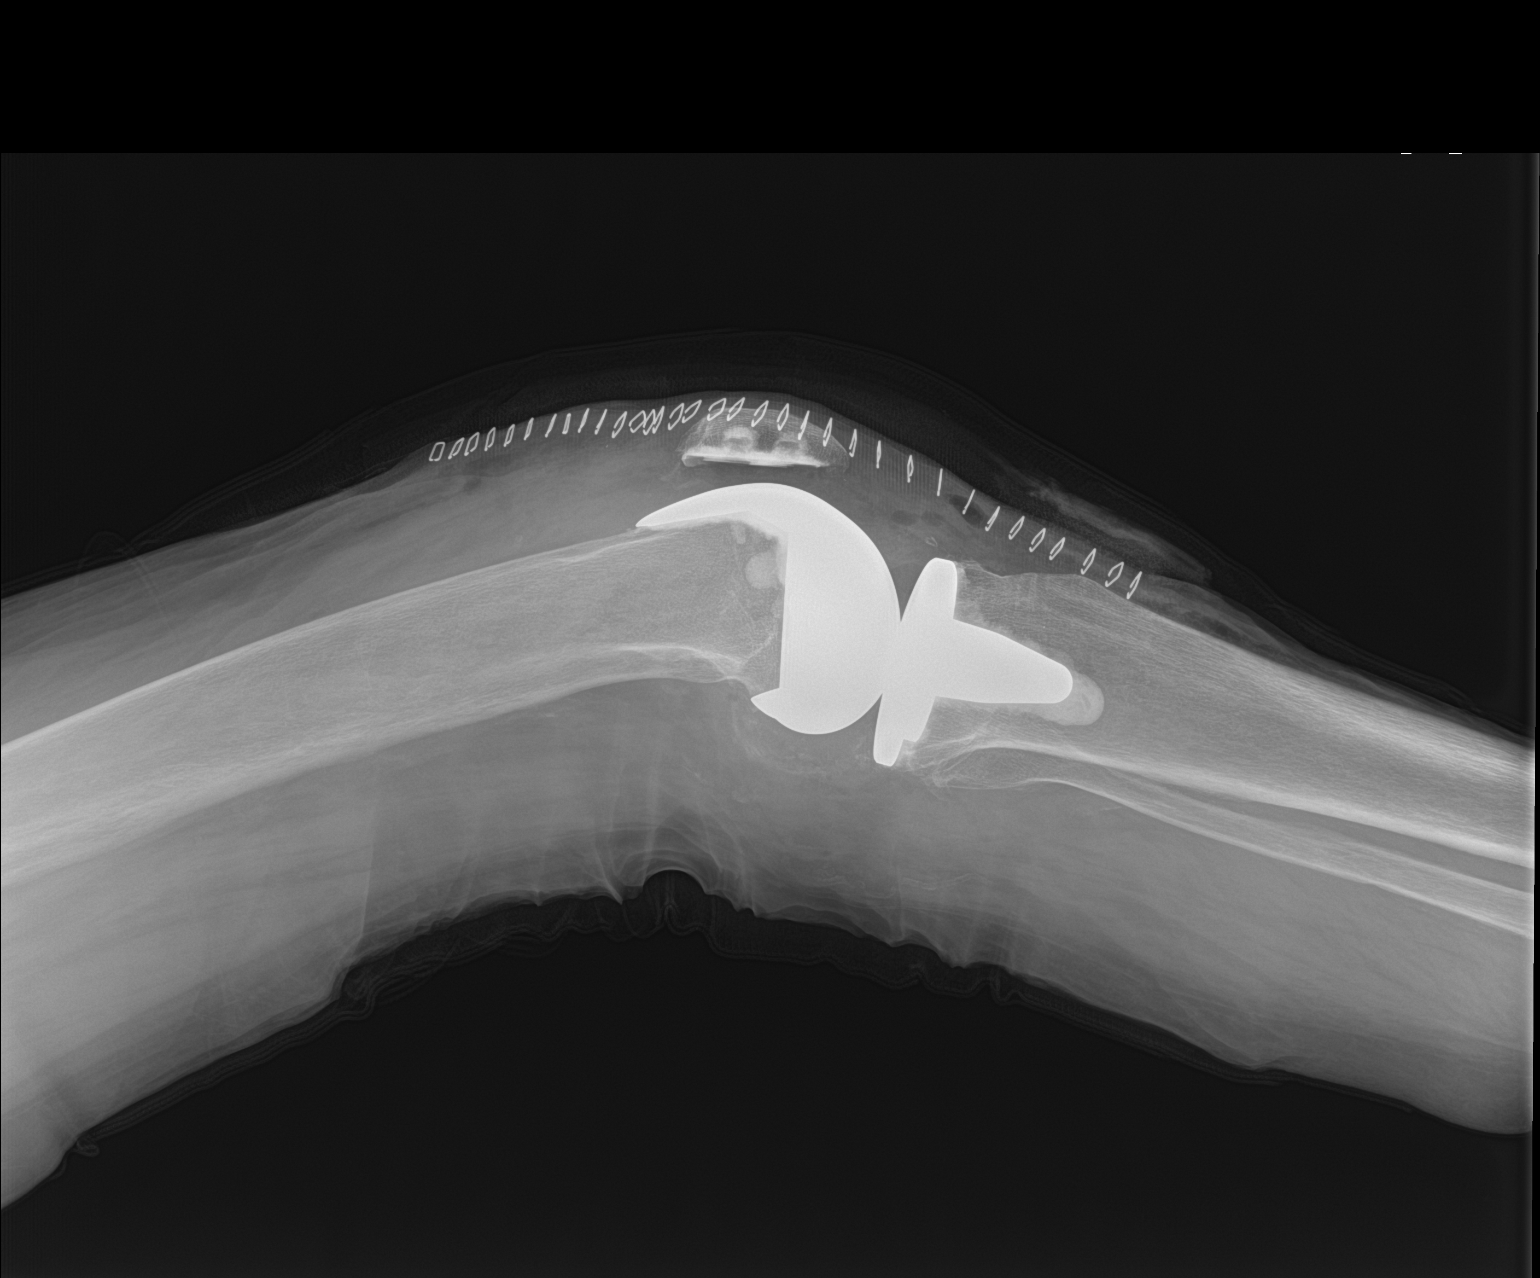

[2 of 2 positions shown; findings below may reference images not displayed]

FINDINGS: Right knee arthroplasty in expected alignment. No periprosthetic
lucency or fracture. There has been patellar resurfacing. Recent
postsurgical change includes air and edema in the joint and soft
tissues is well as anterior skin staples. Suspected prior MCL injury
with spurring from the distal medial femoral condyle metaphysis.
Overlying dressing in place.
IMPRESSION: Right knee arthroplasty without immediate postoperative
complication.

## 2022-07-06 DIAGNOSIS — I480 Paroxysmal atrial fibrillation: Secondary | ICD-10-CM | POA: Diagnosis not present

## 2022-07-12 DIAGNOSIS — N281 Cyst of kidney, acquired: Secondary | ICD-10-CM | POA: Diagnosis not present

## 2022-07-30 DIAGNOSIS — R339 Retention of urine, unspecified: Secondary | ICD-10-CM | POA: Diagnosis not present

## 2022-07-30 DIAGNOSIS — N319 Neuromuscular dysfunction of bladder, unspecified: Secondary | ICD-10-CM | POA: Diagnosis not present

## 2022-08-03 DIAGNOSIS — I480 Paroxysmal atrial fibrillation: Secondary | ICD-10-CM | POA: Diagnosis not present

## 2022-08-06 DIAGNOSIS — Z0000 Encounter for general adult medical examination without abnormal findings: Secondary | ICD-10-CM | POA: Diagnosis not present

## 2022-08-06 DIAGNOSIS — I10 Essential (primary) hypertension: Secondary | ICD-10-CM | POA: Diagnosis not present

## 2022-08-06 DIAGNOSIS — Z6823 Body mass index (BMI) 23.0-23.9, adult: Secondary | ICD-10-CM | POA: Diagnosis not present

## 2022-08-06 DIAGNOSIS — I480 Paroxysmal atrial fibrillation: Secondary | ICD-10-CM | POA: Diagnosis not present

## 2022-08-06 DIAGNOSIS — M172 Bilateral post-traumatic osteoarthritis of knee: Secondary | ICD-10-CM | POA: Diagnosis not present

## 2022-08-06 DIAGNOSIS — I700 Atherosclerosis of aorta: Secondary | ICD-10-CM | POA: Diagnosis not present

## 2022-08-10 DIAGNOSIS — M172 Bilateral post-traumatic osteoarthritis of knee: Secondary | ICD-10-CM | POA: Diagnosis not present

## 2022-08-10 DIAGNOSIS — I700 Atherosclerosis of aorta: Secondary | ICD-10-CM | POA: Diagnosis not present

## 2022-08-10 DIAGNOSIS — Z0000 Encounter for general adult medical examination without abnormal findings: Secondary | ICD-10-CM | POA: Diagnosis not present

## 2022-08-10 DIAGNOSIS — I480 Paroxysmal atrial fibrillation: Secondary | ICD-10-CM | POA: Diagnosis not present

## 2022-08-10 DIAGNOSIS — R7303 Prediabetes: Secondary | ICD-10-CM | POA: Diagnosis not present

## 2022-08-10 DIAGNOSIS — I10 Essential (primary) hypertension: Secondary | ICD-10-CM | POA: Diagnosis not present

## 2022-08-21 DIAGNOSIS — I480 Paroxysmal atrial fibrillation: Secondary | ICD-10-CM | POA: Diagnosis not present

## 2022-08-21 DIAGNOSIS — I10 Essential (primary) hypertension: Secondary | ICD-10-CM | POA: Diagnosis not present

## 2022-08-21 DIAGNOSIS — I700 Atherosclerosis of aorta: Secondary | ICD-10-CM | POA: Diagnosis not present

## 2022-09-02 NOTE — Progress Notes (Unsigned)
Cardiology Office Note   Date:  09/05/2022   ID:  Brittneyann, Delapuente August 30, 1940, MRN [id number]  PCP:  Neale Burly, MD  Cardiologist:   Minus Breeding, MD  Chief Complaint  Patient presents with   Shortness of Breath     History of Present Illness: Pam [name] is a 82 y.o. female who was referred by Neale Burly, MD for evaluation of atrial fib.   Echo was unremarkable. She had cardioversion but only stayed in sinus rhythm for about two days.   She went back into atrial fib.  She is being managed with rate control.  She has had increased SOB.  I sent her for a CPX which demonstrated evidence of diastolic dysfunction and chronotropic incompetence.  She has had a previously elevated BNP.  We have increased her Lasix and her creatinine has crept up but stabilized.  At the last visit I started [place].  She said she feels a little better starting the [place].  She might be breathing a little bit better.  The patient denies any new symptoms such as chest discomfort, neck or arm discomfort. There has been no new shortness of breath, PND or orthopnea. There have been no reported palpitations, presyncope or syncope.    Past Medical History:  Diagnosis Date   Arthritis    Atrial fibrillation ([place])    Cancer [facility])    BREAST CANCER / CERVICAL CANCER    Cervical cancer ([place])    Colitis    Dysrhythmia    History of breast cancer 2006   Dr. Sonny Dandy oncologist - left breast   History of gout    Hyperlipidemia    Self-catheterizes urinary bladder    Urinary retention Sept 2016   s/p hysterectomy    Past Surgical History:  Procedure Laterality Date   BIOPSY  09/09/2020   Procedure: BIOPSY;  Surgeon: Daneil Dolin, MD;  [facility];  Service: Endoscopy;;   BREAST SURGERY     CARDIOVERSION N/A 06/03/2019   Procedure: CARDIOVERSION;  Surgeon: Jerline Pain, MD;  Location: [place];  Service: Cardiovascular;  Laterality: N/A;   CATARACT EXTRACTION W/  INTRAOCULAR LENS  IMPLANT, BILATERAL     COLONOSCOPY WITH PROPOFOL N/A 09/09/2020   Procedure: COLONOSCOPY WITH PROPOFOL;  Surgeon: Daneil Dolin, MD;  [facility];  Service: Endoscopy;  Laterality: N/A;  2:45pm   EYE SURGERY     INGUINAL HERNIA REPAIR Right 12/14/2015   Procedure: LAPAROSCOPIC AND OPEN RIGHT  INGUINAL HERNIA;  Surgeon: Johnathan Hausen, MD;  [facility];  Service: General;  Laterality: Right;   KNEE ARTHROSCOPY Left    MASTECTOMY Left 2006   MASTECTOMY WITH AXILLARY LYMPH NODE DISSECTION Left    ROBOTIC ASSISTED TOTAL HYSTERECTOMY WITH BILATERAL SALPINGO OOPHERECTOMY Bilateral 04/07/2015   Procedure: ROBOTIC ASSISTED RADICAL HYSTERECTOMY BILATERAL SALPINGO OOPHORECTOMY BILATERAL SENTINEL LYMPHADENETOMY;  Surgeon: Everitt Amber, MD;  [facility];  Service: Gynecology;  Laterality: Bilateral;   TOTAL KNEE ARTHROPLASTY Left 07/13/2019   Procedure: LEFT TOTAL KNEE ARTHROPLASTY-CEMENTED;  Surgeon: Marybelle Killings, MD;  Location: [place];  Service: Orthopedics;  Laterality: Left;   TOTAL KNEE ARTHROPLASTY Right 02/15/2020   Procedure: RIGHT TOTAL KNEE ARTHROPLASTY;  Surgeon: Marybelle Killings, MD;  Location: [place];  Service: Orthopedics;  Laterality: Right;   TUBAL LIGATION       Current Outpatient Medications  Medication Sig Dispense Refill   bisacodyl (DULCOLAX) 5 MG EC tablet  Take 5 mg by mouth daily as needed for mild constipation.      dapagliflozin propanediol (FARXIGA) 10 MG TABS tablet Take 1 tablet (10 mg total) by mouth daily before breakfast. 30 tablet 11   diltiazem (CARDIZEM CD) 180 MG 24 hr capsule Take 1 capsule (180 mg total) by mouth daily. 90 capsule 3   ELIQUIS 5 MG TABS tablet Take 5 mg by mouth 2 (two) times daily.     fexofenadine (ALLEGRA) 180 MG tablet Take 180 mg by mouth daily.     furosemide (LASIX) 20 MG tablet Take 2 tablets by mouth once daily 180 tablet 2   gabapentin (NEURONTIN) 300 MG capsule Take 300 mg by mouth at bedtime.      metoprolol tartrate (LOPRESSOR) 50 MG tablet Take 1 tablet (50 mg total) by mouth 2 (two) times daily. 180 tablet 3   rOPINIRole (REQUIP) 1 MG tablet Take 1 mg by mouth at bedtime.  0   rosuvastatin (CRESTOR) 5 MG tablet Take 5 mg by mouth at bedtime.     traMADol (ULTRAM) 50 MG tablet Take 50 mg by mouth every 12 (twelve) hours as needed (pain).     No current facility-administered medications for this visit.    Allergies:   Aspirin, Ciprofloxacin, Flagyl [metronidazole], and Penicillins    ROS:  Please see the history of present illness.   Otherwise, review of systems are positive for none.   All other systems are reviewed and negative.    PHYSICAL EXAM: VS:  BP 118/70   Pulse 76   Ht 5' 3"$  (1.6 m)   Wt 138 lb (62.6 kg)   BMI 24.45 kg/m  , BMI Body mass index is 24.45 kg/m. GENERAL:  Well appearing NECK:  No jugular venous distention, waveform within normal limits, carotid upstroke brisk and symmetric, no bruits, no thyromegaly LUNGS:  Clear to auscultation bilaterally CHEST:  Unremarkable HEART:  PMI not displaced or sustained,S1 and S2 within normal limits, no S3, no S4, no clicks, no rubs, no murmurs. irregular ABD:  Flat, positive bowel sounds normal in frequency in pitch, no bruits, no rebound, no guarding, no midline pulsatile mass, no hepatomegaly, no splenomegaly EXT:  2 plus pulses throughout, no edema, no cyanosis no clubbing   EKG:  EKG is not ordered today.    Recent Labs: 02/14/2022: BUN 19; Creatinine, Ser 1.35; Potassium 4.8; Sodium 144    Lipid Panel No results found for: "CHOL", "TRIG", "HDL", "CHOLHDL", "VLDL", "LDLCALC", "LDLDIRECT"    Wt Readings from Last 3 Encounters:  09/05/22 138 lb (62.6 kg)  06/13/22 138 lb (62.6 kg)  02/28/22 136 lb (61.7 kg)      Other studies Reviewed: Additional studies/ records that were reviewed today include: None Review of the above records demonstrates:  Please see elsewhere in the note.     ASSESSMENT AND  PLAN:  ATRIAL FIB:   Pam [name] has a CHA2DS2 - VASc score of 3.  She is tolerating warfarin and wants to stay on this unless her insurance changes and approves Eliquis.  SOB:      Perfusion study was negative for ischemia.   Her breathing is slightly better with the [place].  Given her mildly reduced ejection fraction going to go ahead and reduce her Cardizem and increase her metoprolol and try to control her rate with beta-blockers alone eventually.  I will check another echocardiogram in July with strain.  I do not have a strong suspicion for amyloid but  we will keep this in mind.  CM: Her EF will be managed as above.  Since she is improved I will hold off on right heart catheterization but will consider this in the future.   Current medicines are reviewed at length with the patient today.  The patient does not have concerns regarding medicines.  The following changes have been made:     Labs/ tests ordered today include:     Orders Placed This Encounter  Procedures   ECHOCARDIOGRAM COMPLETE     Disposition:   FU with me in 6 weeks.   Signed, Minus Breeding, MD  09/05/2022 11:56 AM    [place]

## 2022-09-05 ENCOUNTER — Encounter: Payer: Self-pay | Admitting: Cardiology

## 2022-09-05 ENCOUNTER — Ambulatory Visit: Payer: Medicare Other | Admitting: Cardiology

## 2022-09-05 VITALS — BP 118/70 | HR 76 | Ht 63.0 in | Wt 138.0 lb

## 2022-09-05 DIAGNOSIS — I4821 Permanent atrial fibrillation: Secondary | ICD-10-CM | POA: Diagnosis not present

## 2022-09-05 DIAGNOSIS — R0602 Shortness of breath: Secondary | ICD-10-CM | POA: Diagnosis not present

## 2022-09-05 MED ORDER — DILTIAZEM HCL ER COATED BEADS 180 MG PO CP24: 180.0000 mg | ORAL_CAPSULE | Freq: Every day | ORAL | 3 refills | Status: DC

## 2022-09-05 MED ORDER — METOPROLOL TARTRATE 50 MG PO TABS: 50.0000 mg | ORAL_TABLET | Freq: Two times a day (BID) | ORAL | 3 refills | Status: DC

## 2022-09-05 NOTE — Patient Instructions (Signed)
Medication Instructions:  Please increase Metoprolol Tartrate to 50 mg twice a day. Please decrease Diltiazem to 180 mg once a day. Continue all other medications as listed.  *If you need a refill on your cardiac medications before your next appointment, please call your pharmacy*  Testing/Procedures: Your physician has requested that you have an echocardiogram at [facility]. Echocardiography is a painless test that uses sound waves to create images of your heart. It provides your doctor with information about the size and shape of your heart and how well your heart's chambers and valves are working. This procedure takes approximately one hour. There are no restrictions for this procedure. Please do NOT wear cologne, perfume, aftershave, or lotions (deodorant is allowed). Please arrive 15 minutes prior to your appointment time. You will be contacted to be scheduled for this appointment.   Follow-Up: At [facility], you and your health needs are our priority.  As part of our continuing mission to provide you with exceptional heart care, we have created designated Provider Care Teams.  These Care Teams include your primary Cardiologist (physician) and Advanced Practice Providers (APPs -  Physician Assistants and Nurse Practitioners) who all work together to provide you with the care you need, when you need it.  We recommend signing up for the patient portal called "MyChart".  Sign up information is provided on this After Visit Summary.  MyChart is used to connect with patients for Virtual Visits (Telemedicine).  Patients are able to view lab/test results, encounter notes, upcoming appointments, etc.  Non-urgent messages can be sent to your provider as well.   To learn more about what you can do with MyChart, go to [email or web address].    Your next appointment:   6 week(s)  Provider:   Minus Breeding, MD

## 2022-10-22 DIAGNOSIS — E785 Hyperlipidemia, unspecified: Secondary | ICD-10-CM | POA: Insufficient documentation

## 2022-10-22 NOTE — Progress Notes (Signed)
40 minutes Cardiology Office Note:   Date:  10/24/2022  ID:  Leisl, Denman 12-25-1940, MRN [id number]  History of Present Illness:   MAKKAH VANDEGRIFT is a 82 y.o. female who was referred by Toma Deiters, MD for evaluation of atrial fib.   Echo was unremarkable. She had cardioversion but only stayed in sinus rhythm for about two days.   She went back into atrial fib.  She is being managed with rate control.  She has had increased SOB.  I sent her for a CPX which demonstrated evidence of diastolic dysfunction and chronotropic incompetence.  She has had a previously elevated BNP.  We have increased her Lasix and her creatinine has crept up but stabilized.    At the last visit I reduced the Cardizem and I am uptitrating her beta-blocker.  She did okay with this.  She thinks her breathing is continue to be slightly better.  She is not having any new PND or orthopnea.  Is not having any chest pressure, neck or arm discomfort.  She does not really notice her atrial fibrillation.  She brings me a blood pressure diary and heart rate diary that demonstrates very reasonable blood pressure and heart rate control.  I been titrating her medications because her ejection fraction was slightly reduced and I wanted to discontinue the Cardizem altogether.    ROS:   Hot flashes.  Otherwise as stated in the HPI and negative for all other systems.  Studies Reviewed:    EKG: Atrial fibrillation, rate 86, axis within normal limits, intervals within normal limits, no acute ST-T wave changes.   Risk Assessment/Calculations:   Ms. DYONNE ZIRKELBACH has a CHA2DS2 - VASc score of 3.        Physical Exam:   VS:  BP 100/70   Pulse 86   Ht 5\' 3"  (1.6 m)   Wt 136 lb (61.7 kg)   BMI 24.09 kg/m    Wt Readings from Last 3 Encounters:  10/24/22 136 lb (61.7 kg)  09/05/22 138 lb (62.6 kg)  06/13/22 138 lb (62.6 kg)     GEN: Well nourished, well developed in no acute distress NECK: No JVD; No carotid  bruits CARDIAC: IrregularRR, no murmurs, rubs, gallops RESPIRATORY:  Clear to auscultation without rales, wheezing or rhonchi  ABDOMEN: Soft, non-tender, non-distended EXTREMITIES:  No edema; No deformity   ASSESSMENT AND PLAN:   ATRIAL FIB:   Ms. APOLLONIA RADCLIFF has a CHA2DS2 - VASc score of 3.  He tolerates Eliquis now but this is covered by [employer].  No change in therapy.   SOB:      Perfusion study was negative for ischemia.  She probably had some chronotropic incompetence revealed on CPX.,  She seems to be doing well with current regimen including some diuretic, salt restriction and fluid restriction.  Because of the mildly reduced ejection fraction I will get rid of the Cardizem altogether now and titrate slowly with her metoprolol going to 75 mg twice daily.   CM: I am managing her medically as above.  Because her symptoms have improved I am not going to do a right heart catheterization which I considered previously.   HOT FLASHES: I will order a TSH.  Signed, Rollene Rotunda, MD

## 2022-10-24 ENCOUNTER — Other Ambulatory Visit: Payer: Self-pay | Admitting: *Deleted

## 2022-10-24 ENCOUNTER — Encounter: Payer: Self-pay | Admitting: Cardiology

## 2022-10-24 ENCOUNTER — Ambulatory Visit: Payer: Medicare Other | Admitting: Cardiology

## 2022-10-24 VITALS — BP 100/70 | HR 86 | Ht 63.0 in | Wt 136.0 lb

## 2022-10-24 DIAGNOSIS — R339 Retention of urine, unspecified: Secondary | ICD-10-CM | POA: Diagnosis not present

## 2022-10-24 DIAGNOSIS — R0602 Shortness of breath: Secondary | ICD-10-CM

## 2022-10-24 DIAGNOSIS — I482 Chronic atrial fibrillation, unspecified: Secondary | ICD-10-CM

## 2022-10-24 DIAGNOSIS — Z79899 Other long term (current) drug therapy: Secondary | ICD-10-CM

## 2022-10-24 DIAGNOSIS — E785 Hyperlipidemia, unspecified: Secondary | ICD-10-CM | POA: Diagnosis not present

## 2022-10-24 DIAGNOSIS — N319 Neuromuscular dysfunction of bladder, unspecified: Secondary | ICD-10-CM | POA: Diagnosis not present

## 2022-10-24 MED ORDER — METOPROLOL TARTRATE 50 MG PO TABS: 75.0000 mg | ORAL_TABLET | Freq: Two times a day (BID) | ORAL | 3 refills | Status: DC

## 2022-10-24 NOTE — Patient Instructions (Signed)
Medication Instructions:  Please discontinue your Cardizem. Increase Metoprolol tartrate to 50 mg - 1 & 1/2 tablets twice a day for a total of 75 mg twice a day. Continue all other medications as listed.  *If you need a refill on your cardiac medications before your next appointment, please call your pharmacy*  Lab Work: Please have blood work at your closest [facility]. (Two in [place] and one in [place]) If you have labs (blood work) drawn today and your tests are completely normal, you will receive your results only by: [employer] (if you have [place]) OR A paper copy in the mail If you have any lab test that is abnormal or we need to change your treatment, we will call you to review the results.  Follow-Up: At [facility], you and your health needs are our priority.  As part of our continuing mission to provide you with exceptional heart care, we have created designated Provider Care Teams.  These Care Teams include your primary Cardiologist (physician) and Advanced Practice Providers (APPs -  Physician Assistants and Nurse Practitioners) who all work together to provide you with the care you need, when you need it.  We recommend signing up for the patient portal called "MyChart".  Sign up information is provided on this After Visit Summary.  MyChart is used to connect with patients for Virtual Visits (Telemedicine).  Patients are able to view lab/test results, encounter notes, upcoming appointments, etc.  Non-urgent messages can be sent to your provider as well.   To learn more about what you can do with MyChart, go to [email or web address].    Your next appointment:   6 month(s)  Provider:   Minus Breeding, MD

## 2022-10-25 DIAGNOSIS — Z79899 Other long term (current) drug therapy: Secondary | ICD-10-CM | POA: Diagnosis not present

## 2022-10-25 DIAGNOSIS — I482 Chronic atrial fibrillation, unspecified: Secondary | ICD-10-CM | POA: Diagnosis not present

## 2022-10-26 LAB — TSH: TSH: 1.12 u[IU]/mL (ref 0.450–4.500)

## 2022-10-29 ENCOUNTER — Encounter: Payer: Self-pay | Admitting: *Deleted

## 2022-11-05 DIAGNOSIS — I480 Paroxysmal atrial fibrillation: Secondary | ICD-10-CM | POA: Diagnosis not present

## 2022-11-05 DIAGNOSIS — R338 Other retention of urine: Secondary | ICD-10-CM | POA: Diagnosis not present

## 2022-11-05 DIAGNOSIS — Z6823 Body mass index (BMI) 23.0-23.9, adult: Secondary | ICD-10-CM | POA: Diagnosis not present

## 2022-11-05 DIAGNOSIS — M172 Bilateral post-traumatic osteoarthritis of knee: Secondary | ICD-10-CM | POA: Diagnosis not present

## 2022-11-05 DIAGNOSIS — I700 Atherosclerosis of aorta: Secondary | ICD-10-CM | POA: Diagnosis not present

## 2022-11-05 DIAGNOSIS — I10 Essential (primary) hypertension: Secondary | ICD-10-CM | POA: Diagnosis not present

## 2022-11-05 DIAGNOSIS — Z0000 Encounter for general adult medical examination without abnormal findings: Secondary | ICD-10-CM | POA: Diagnosis not present

## 2022-11-14 ENCOUNTER — Other Ambulatory Visit: Payer: Self-pay | Admitting: Cardiology

## 2022-11-19 DIAGNOSIS — Z6823 Body mass index (BMI) 23.0-23.9, adult: Secondary | ICD-10-CM | POA: Diagnosis not present

## 2023-01-10 DIAGNOSIS — M810 Age-related osteoporosis without current pathological fracture: Secondary | ICD-10-CM | POA: Diagnosis not present

## 2023-01-17 DIAGNOSIS — R339 Retention of urine, unspecified: Secondary | ICD-10-CM | POA: Diagnosis not present

## 2023-01-17 DIAGNOSIS — N319 Neuromuscular dysfunction of bladder, unspecified: Secondary | ICD-10-CM | POA: Diagnosis not present

## 2023-01-21 ENCOUNTER — Ambulatory Visit (HOSPITAL_COMMUNITY)
Admission: RE | Admit: 2023-01-21 | Discharge: 2023-01-21 | Disposition: A | Discharge status: Home / Self Care | Payer: Medicare Other | Source: Ambulatory Visit | Attending: Cardiology | Admitting: Cardiology

## 2023-01-21 DIAGNOSIS — R0602 Shortness of breath: Secondary | ICD-10-CM | POA: Insufficient documentation

## 2023-01-21 DIAGNOSIS — I4821 Permanent atrial fibrillation: Secondary | ICD-10-CM | POA: Diagnosis not present

## 2023-01-21 LAB — ECHOCARDIOGRAM COMPLETE
AR max vel: 1.66 cm2
AV Area VTI: 1.72 cm2
AV Area mean vel: 1.65 cm2
AV Mean grad: 2.5 mmHg
AV Peak grad: 4.3 mmHg
Ao pk vel: 1.04 m/s
Area-P 1/2: 5.27 cm2
Est EF: 50
MV M vel: 4.48 m/s
MV Peak grad: 80.3 mmHg
Radius: 0.5 cm
S' Lateral: 2.8 cm

## 2023-01-21 NOTE — Progress Notes (Signed)
*  PRELIMINARY RESULTS* Echocardiogram 2D Echocardiogram has been performed.  Pam [name] 01/21/2023, 4:28 PM

## 2023-02-05 DIAGNOSIS — I480 Paroxysmal atrial fibrillation: Secondary | ICD-10-CM | POA: Diagnosis not present

## 2023-02-05 DIAGNOSIS — M172 Bilateral post-traumatic osteoarthritis of knee: Secondary | ICD-10-CM | POA: Diagnosis not present

## 2023-02-05 DIAGNOSIS — I5032 Chronic diastolic (congestive) heart failure: Secondary | ICD-10-CM | POA: Diagnosis not present

## 2023-02-05 DIAGNOSIS — R338 Other retention of urine: Secondary | ICD-10-CM | POA: Diagnosis not present

## 2023-02-05 DIAGNOSIS — Z6823 Body mass index (BMI) 23.0-23.9, adult: Secondary | ICD-10-CM | POA: Diagnosis not present

## 2023-02-05 DIAGNOSIS — I10 Essential (primary) hypertension: Secondary | ICD-10-CM | POA: Diagnosis not present

## 2023-02-05 DIAGNOSIS — I700 Atherosclerosis of aorta: Secondary | ICD-10-CM | POA: Diagnosis not present

## 2023-02-08 DIAGNOSIS — M172 Bilateral post-traumatic osteoarthritis of knee: Secondary | ICD-10-CM | POA: Diagnosis not present

## 2023-02-08 DIAGNOSIS — I10 Essential (primary) hypertension: Secondary | ICD-10-CM | POA: Diagnosis not present

## 2023-02-08 DIAGNOSIS — R7303 Prediabetes: Secondary | ICD-10-CM | POA: Diagnosis not present

## 2023-02-08 DIAGNOSIS — I700 Atherosclerosis of aorta: Secondary | ICD-10-CM | POA: Diagnosis not present

## 2023-02-08 DIAGNOSIS — I5032 Chronic diastolic (congestive) heart failure: Secondary | ICD-10-CM | POA: Diagnosis not present

## 2023-02-08 DIAGNOSIS — R338 Other retention of urine: Secondary | ICD-10-CM | POA: Diagnosis not present

## 2023-03-04 NOTE — Progress Notes (Unsigned)
Cardiology Office Note:   Date:  03/06/2023  ID:  Pam [name], DOB 14-Aug-1940, MRN [id number] PCP: Toma Deiters, MD  [facility] Providers Cardiologist:  Rollene Rotunda, MD {  History of Present Illness:   Pam [name] is a 82 y.o. female  who was referred by Toma Deiters, MD for evaluation of atrial fib.   Echo was unremarkable. She had cardioversion but only stayed in sinus rhythm for about two days.   She went back into atrial fib.  She is being managed with rate control.  She has had increased SOB.  I sent her for a CPX which demonstrated evidence of diastolic dysfunction and chronotropic incompetence.  She has had a previously elevated BNP.  We have increased her Lasix and her creatinine has crept up but stabilized.     At the last visit I discontinued her Cardizem and have been going up slightly on her beta-blocker.    She thinks her breathing has been slightly better since then.  She had any new shortness of breath, PND or orthopnea.  She has not had any new palpitations, presyncope or syncope.  She has had no weight gain or edema.  She gets out and does a little bit of stuff like going shopping with her daughter.  She did have an echo in July that demonstrated EF to be about the same at 50%.  There is a questionable PFO.  She has left atrial enlargement.   ROS: As stated in the HPI and negative for all other systems.  Studies Reviewed:    EKG:   EKG Interpretation Date/Time:  Wednesday March 06 2023 11:29:16 EDT Ventricular Rate:  93 PR Interval:    QRS Duration:  76 QT Interval:  364 QTC Calculation: 452 R Axis:   2  Text Interpretation: Atrial fibrillation Nonspecific T wave abnormality Abnormal ECG When compared with ECG of 02-Jul-2019 09:00, No significant change since last tracing Confirmed by Rollene Rotunda ([id number]) on 03/06/2023 12:05:39 PM    Risk Assessment/Calculations:    CHA2DS2-VASc Score = 3   This indicates a 3.2% annual risk of  stroke. The patient's score is based upon: CHF History: 0 HTN History: 0 Diabetes History: 0 Stroke History: 0 Vascular Disease History: 0 Age Score: 2 Gender Score: 1             Physical Exam:   VS:  BP 108/78   Pulse 93   Ht 5\' 3"  (1.6 m)   Wt 137 lb (62.1 kg)   BMI 24.27 kg/m    Wt Readings from Last 3 Encounters:  03/06/23 137 lb (62.1 kg)  10/24/22 136 lb (61.7 kg)  09/05/22 138 lb (62.6 kg)     GEN: Well nourished, well developed in no acute distress NECK: No JVD; No carotid bruits CARDIAC: Irregular RR, no murmurs, rubs, gallops RESPIRATORY:  Clear to auscultation without rales, wheezing or rhonchi  ABDOMEN: Soft, non-tender, non-distended EXTREMITIES:  No edema; No deformity   ASSESSMENT AND PLAN:   ATRIAL FIB:   Ms. Pam [name] has a CHA2DS2 - VASc score of 3.  She has had reasonable rate control.  She tolerates anticoagulation.  No change in therapy.   SOB:      Perfusion study was negative for ischemia.  She had some chronotropic incompetence revealed on CPX.,  However, she seems to tolerate the slightly increased beta-blocker and is actually breathing better with this.  No change in therapy.    CM:  I am managing this medically as above.  Given the fact that her breathing is better I would not consider right heart cath.  No further change in therapy.        Follow up me in 12 months.   Signed, Rollene Rotunda, MD

## 2023-03-06 ENCOUNTER — Ambulatory Visit: Payer: Medicare Other | Admitting: Cardiology

## 2023-03-06 ENCOUNTER — Encounter: Payer: Self-pay | Admitting: Cardiology

## 2023-03-06 VITALS — BP 108/78 | HR 93 | Ht 63.0 in | Wt 137.0 lb

## 2023-03-06 DIAGNOSIS — R0602 Shortness of breath: Secondary | ICD-10-CM

## 2023-03-06 DIAGNOSIS — I517 Cardiomegaly: Secondary | ICD-10-CM | POA: Diagnosis not present

## 2023-03-06 DIAGNOSIS — I482 Chronic atrial fibrillation, unspecified: Secondary | ICD-10-CM | POA: Diagnosis not present

## 2023-03-06 NOTE — Patient Instructions (Signed)

## 2023-03-07 DIAGNOSIS — C50012 Malignant neoplasm of nipple and areola, left female breast: Secondary | ICD-10-CM | POA: Diagnosis not present

## 2023-03-07 DIAGNOSIS — C50912 Malignant neoplasm of unspecified site of left female breast: Secondary | ICD-10-CM | POA: Diagnosis not present

## 2023-05-08 ENCOUNTER — Other Ambulatory Visit: Payer: Self-pay | Admitting: Cardiology

## 2023-05-08 DIAGNOSIS — N1831 Chronic kidney disease, stage 3a: Secondary | ICD-10-CM | POA: Diagnosis not present

## 2023-05-08 DIAGNOSIS — I10 Essential (primary) hypertension: Secondary | ICD-10-CM | POA: Diagnosis not present

## 2023-05-08 DIAGNOSIS — R338 Other retention of urine: Secondary | ICD-10-CM | POA: Diagnosis not present

## 2023-05-08 DIAGNOSIS — M172 Bilateral post-traumatic osteoarthritis of knee: Secondary | ICD-10-CM | POA: Diagnosis not present

## 2023-05-08 DIAGNOSIS — I480 Paroxysmal atrial fibrillation: Secondary | ICD-10-CM | POA: Diagnosis not present

## 2023-05-08 DIAGNOSIS — Z6824 Body mass index (BMI) 24.0-24.9, adult: Secondary | ICD-10-CM | POA: Diagnosis not present

## 2023-05-08 DIAGNOSIS — I5032 Chronic diastolic (congestive) heart failure: Secondary | ICD-10-CM | POA: Diagnosis not present

## 2023-05-08 DIAGNOSIS — I700 Atherosclerosis of aorta: Secondary | ICD-10-CM | POA: Diagnosis not present

## 2023-05-27 DIAGNOSIS — Z1231 Encounter for screening mammogram for malignant neoplasm of breast: Secondary | ICD-10-CM | POA: Diagnosis not present

## 2023-06-12 DIAGNOSIS — B078 Other viral warts: Secondary | ICD-10-CM | POA: Diagnosis not present

## 2023-06-12 DIAGNOSIS — C44319 Basal cell carcinoma of skin of other parts of face: Secondary | ICD-10-CM | POA: Diagnosis not present

## 2023-06-21 ENCOUNTER — Other Ambulatory Visit: Payer: Self-pay | Admitting: Cardiology

## 2023-06-26 ENCOUNTER — Other Ambulatory Visit: Payer: Self-pay

## 2023-06-26 MED ORDER — DAPAGLIFLOZIN PROPANEDIOL 10 MG PO TABS: 10.0000 mg | ORAL_TABLET | Freq: Every day | ORAL | 8 refills | Status: DC

## 2023-07-29 DIAGNOSIS — L820 Inflamed seborrheic keratosis: Secondary | ICD-10-CM | POA: Diagnosis not present

## 2023-07-29 DIAGNOSIS — L570 Actinic keratosis: Secondary | ICD-10-CM | POA: Diagnosis not present

## 2023-07-29 DIAGNOSIS — X32XXXA Exposure to sunlight, initial encounter: Secondary | ICD-10-CM | POA: Diagnosis not present

## 2023-07-29 DIAGNOSIS — Z85828 Personal history of other malignant neoplasm of skin: Secondary | ICD-10-CM | POA: Diagnosis not present

## 2023-07-29 DIAGNOSIS — Z08 Encounter for follow-up examination after completed treatment for malignant neoplasm: Secondary | ICD-10-CM | POA: Diagnosis not present

## 2023-08-14 DIAGNOSIS — N1831 Chronic kidney disease, stage 3a: Secondary | ICD-10-CM | POA: Diagnosis not present

## 2023-08-14 DIAGNOSIS — Z0000 Encounter for general adult medical examination without abnormal findings: Secondary | ICD-10-CM | POA: Diagnosis not present

## 2023-08-14 DIAGNOSIS — R338 Other retention of urine: Secondary | ICD-10-CM | POA: Diagnosis not present

## 2023-08-14 DIAGNOSIS — Z6824 Body mass index (BMI) 24.0-24.9, adult: Secondary | ICD-10-CM | POA: Diagnosis not present

## 2023-08-14 DIAGNOSIS — M172 Bilateral post-traumatic osteoarthritis of knee: Secondary | ICD-10-CM | POA: Diagnosis not present

## 2023-08-14 DIAGNOSIS — I700 Atherosclerosis of aorta: Secondary | ICD-10-CM | POA: Diagnosis not present

## 2023-08-14 DIAGNOSIS — I5032 Chronic diastolic (congestive) heart failure: Secondary | ICD-10-CM | POA: Diagnosis not present

## 2023-08-14 DIAGNOSIS — I480 Paroxysmal atrial fibrillation: Secondary | ICD-10-CM | POA: Diagnosis not present

## 2023-08-14 DIAGNOSIS — I10 Essential (primary) hypertension: Secondary | ICD-10-CM | POA: Diagnosis not present

## 2023-08-16 DIAGNOSIS — N1831 Chronic kidney disease, stage 3a: Secondary | ICD-10-CM | POA: Diagnosis not present

## 2023-08-16 DIAGNOSIS — M172 Bilateral post-traumatic osteoarthritis of knee: Secondary | ICD-10-CM | POA: Diagnosis not present

## 2023-08-16 DIAGNOSIS — Z0000 Encounter for general adult medical examination without abnormal findings: Secondary | ICD-10-CM | POA: Diagnosis not present

## 2023-08-16 DIAGNOSIS — I5032 Chronic diastolic (congestive) heart failure: Secondary | ICD-10-CM | POA: Diagnosis not present

## 2023-08-16 DIAGNOSIS — R338 Other retention of urine: Secondary | ICD-10-CM | POA: Diagnosis not present

## 2023-08-27 ENCOUNTER — Telehealth: Payer: Self-pay | Admitting: Cardiology

## 2023-08-27 NOTE — Telephone Encounter (Signed)
 Pt c/o medication issue:  1. Name of Medication:   metoprolol  tartrate (LOPRESSOR ) 50 MG tablet   2. How are you currently taking this medication (dosage and times per day)?  Not taking  3. Are you having a reaction (difficulty breathing--STAT)?   4. What is your medication issue?   Patient called to report her primary doctor took her off of this medication and put her on Coreg  and Spironolactone.

## 2023-08-27 NOTE — Telephone Encounter (Signed)
 Called and spoke to patient. Verified name and DOB. Patient is calling to inform Dr Lavona that her PCP took her off Metoprolol  and started her on Coreg  3.125 mg BID and Spironolactone 25 mg every day. She was not sure why they made the change, only that it had something to do with CHF.

## 2023-09-15 DIAGNOSIS — Z6824 Body mass index (BMI) 24.0-24.9, adult: Secondary | ICD-10-CM | POA: Diagnosis not present

## 2023-09-15 DIAGNOSIS — S60211A Contusion of right wrist, initial encounter: Secondary | ICD-10-CM | POA: Diagnosis not present

## 2023-09-15 DIAGNOSIS — R030 Elevated blood-pressure reading, without diagnosis of hypertension: Secondary | ICD-10-CM | POA: Diagnosis not present

## 2023-09-20 DIAGNOSIS — N130 Hydronephrosis with ureteropelvic junction obstruction: Secondary | ICD-10-CM | POA: Diagnosis not present

## 2023-10-18 DIAGNOSIS — R3912 Poor urinary stream: Secondary | ICD-10-CM | POA: Diagnosis not present

## 2023-10-18 DIAGNOSIS — N130 Hydronephrosis with ureteropelvic junction obstruction: Secondary | ICD-10-CM | POA: Diagnosis not present

## 2023-10-18 DIAGNOSIS — R338 Other retention of urine: Secondary | ICD-10-CM | POA: Diagnosis not present

## 2023-11-14 DIAGNOSIS — M172 Bilateral post-traumatic osteoarthritis of knee: Secondary | ICD-10-CM | POA: Diagnosis not present

## 2023-11-14 DIAGNOSIS — I5032 Chronic diastolic (congestive) heart failure: Secondary | ICD-10-CM | POA: Diagnosis not present

## 2023-11-14 DIAGNOSIS — Z0000 Encounter for general adult medical examination without abnormal findings: Secondary | ICD-10-CM | POA: Diagnosis not present

## 2023-11-14 DIAGNOSIS — I700 Atherosclerosis of aorta: Secondary | ICD-10-CM | POA: Diagnosis not present

## 2023-11-14 DIAGNOSIS — I480 Paroxysmal atrial fibrillation: Secondary | ICD-10-CM | POA: Diagnosis not present

## 2023-11-14 DIAGNOSIS — Z6823 Body mass index (BMI) 23.0-23.9, adult: Secondary | ICD-10-CM | POA: Diagnosis not present

## 2023-11-14 DIAGNOSIS — N1832 Chronic kidney disease, stage 3b: Secondary | ICD-10-CM | POA: Diagnosis not present

## 2023-11-14 DIAGNOSIS — I10 Essential (primary) hypertension: Secondary | ICD-10-CM | POA: Diagnosis not present

## 2023-11-14 DIAGNOSIS — R338 Other retention of urine: Secondary | ICD-10-CM | POA: Diagnosis not present

## 2024-01-21 ENCOUNTER — Other Ambulatory Visit: Payer: Self-pay

## 2024-01-21 MED ORDER — DAPAGLIFLOZIN PROPANEDIOL 10 MG PO TABS: 10.0000 mg | ORAL_TABLET | Freq: Every day | ORAL | 1 refills | Status: DC

## 2024-02-03 DIAGNOSIS — L821 Other seborrheic keratosis: Secondary | ICD-10-CM | POA: Diagnosis not present

## 2024-02-03 DIAGNOSIS — L820 Inflamed seborrheic keratosis: Secondary | ICD-10-CM | POA: Diagnosis not present

## 2024-02-03 DIAGNOSIS — Z08 Encounter for follow-up examination after completed treatment for malignant neoplasm: Secondary | ICD-10-CM | POA: Diagnosis not present

## 2024-02-03 DIAGNOSIS — Z85828 Personal history of other malignant neoplasm of skin: Secondary | ICD-10-CM | POA: Diagnosis not present

## 2024-02-03 DIAGNOSIS — D485 Neoplasm of uncertain behavior of skin: Secondary | ICD-10-CM | POA: Diagnosis not present

## 2024-02-13 DIAGNOSIS — N1832 Chronic kidney disease, stage 3b: Secondary | ICD-10-CM | POA: Diagnosis not present

## 2024-02-13 DIAGNOSIS — R338 Other retention of urine: Secondary | ICD-10-CM | POA: Diagnosis not present

## 2024-02-13 DIAGNOSIS — I480 Paroxysmal atrial fibrillation: Secondary | ICD-10-CM | POA: Diagnosis not present

## 2024-02-13 DIAGNOSIS — I700 Atherosclerosis of aorta: Secondary | ICD-10-CM | POA: Diagnosis not present

## 2024-02-13 DIAGNOSIS — I5032 Chronic diastolic (congestive) heart failure: Secondary | ICD-10-CM | POA: Diagnosis not present

## 2024-02-13 DIAGNOSIS — I10 Essential (primary) hypertension: Secondary | ICD-10-CM | POA: Diagnosis not present

## 2024-02-13 DIAGNOSIS — M172 Bilateral post-traumatic osteoarthritis of knee: Secondary | ICD-10-CM | POA: Diagnosis not present

## 2024-02-13 DIAGNOSIS — Z6822 Body mass index (BMI) 22.0-22.9, adult: Secondary | ICD-10-CM | POA: Diagnosis not present

## 2024-02-14 DIAGNOSIS — I5032 Chronic diastolic (congestive) heart failure: Secondary | ICD-10-CM | POA: Diagnosis not present

## 2024-02-14 DIAGNOSIS — I10 Essential (primary) hypertension: Secondary | ICD-10-CM | POA: Diagnosis not present

## 2024-02-14 DIAGNOSIS — N1832 Chronic kidney disease, stage 3b: Secondary | ICD-10-CM | POA: Diagnosis not present

## 2024-02-14 DIAGNOSIS — R338 Other retention of urine: Secondary | ICD-10-CM | POA: Diagnosis not present

## 2024-02-14 DIAGNOSIS — M172 Bilateral post-traumatic osteoarthritis of knee: Secondary | ICD-10-CM | POA: Diagnosis not present

## 2024-03-17 ENCOUNTER — Other Ambulatory Visit: Payer: Self-pay | Admitting: Cardiology

## 2024-03-18 ENCOUNTER — Telehealth: Payer: Self-pay | Admitting: Cardiology

## 2024-03-18 MED ORDER — DAPAGLIFLOZIN PROPANEDIOL 10 MG PO TABS
10.0000 mg | ORAL_TABLET | Freq: Every day | ORAL | 0 refills | Status: DC
Start: 2024-03-18 — End: 2024-06-09

## 2024-03-18 NOTE — Telephone Encounter (Signed)
 RX sent in

## 2024-03-18 NOTE — Telephone Encounter (Signed)
*  STAT* If patient is at the pharmacy, call can be transferred to refill team.   1. Which medications need to be refilled? (please list name of each medication and dose if known)   dapagliflozin  propanediol (FARXIGA ) 10 MG TABS tablet     2. Would you like to learn more about the convenience, safety, & potential cost savings by using the [facility]? NO    3. Are you open to using the [facility] NO   4. Which pharmacy/location (including street and city if local pharmacy) is medication to be sent to?  [id number] - [place], [place] - [address]     5. Do they need a 30 day or 90 day supply? 30

## 2024-04-11 ENCOUNTER — Emergency Department (HOSPITAL_COMMUNITY)
Admission: EM | Admit: 2024-04-11 | Discharge: 2024-04-11 | Disposition: A | Discharge status: Home / Self Care | Attending: Emergency Medicine | Admitting: Emergency Medicine

## 2024-04-11 ENCOUNTER — Other Ambulatory Visit: Payer: Self-pay

## 2024-04-11 ENCOUNTER — Emergency Department (HOSPITAL_COMMUNITY)

## 2024-04-11 DIAGNOSIS — I4891 Unspecified atrial fibrillation: Secondary | ICD-10-CM | POA: Diagnosis not present

## 2024-04-11 DIAGNOSIS — I482 Chronic atrial fibrillation, unspecified: Secondary | ICD-10-CM | POA: Diagnosis not present

## 2024-04-11 DIAGNOSIS — R0600 Dyspnea, unspecified: Secondary | ICD-10-CM | POA: Diagnosis not present

## 2024-04-11 DIAGNOSIS — Z7901 Long term (current) use of anticoagulants: Secondary | ICD-10-CM | POA: Diagnosis not present

## 2024-04-11 DIAGNOSIS — R0602 Shortness of breath: Secondary | ICD-10-CM | POA: Diagnosis not present

## 2024-04-11 DIAGNOSIS — R200 Anesthesia of skin: Secondary | ICD-10-CM | POA: Diagnosis not present

## 2024-04-11 DIAGNOSIS — I700 Atherosclerosis of aorta: Secondary | ICD-10-CM | POA: Diagnosis not present

## 2024-04-11 LAB — CBC
HCT: 41.9 % (ref 36.0–46.0)
Hemoglobin: 13.5 g/dL (ref 12.0–15.0)
MCH: 29.4 pg (ref 26.0–34.0)
MCHC: 32.2 g/dL (ref 30.0–36.0)
MCV: 91.3 fL (ref 80.0–100.0)
Platelets: 295 K/uL (ref 150–400)
RBC: 4.59 MIL/uL (ref 3.87–5.11)
RDW: 13.9 % (ref 11.5–15.5)
WBC: 9.6 K/uL (ref 4.0–10.5)
nRBC: 0 % (ref 0.0–0.2)

## 2024-04-11 LAB — BASIC METABOLIC PANEL WITH GFR
Anion gap: 15 (ref 5–15)
BUN: 43 mg/dL — ABNORMAL HIGH (ref 8–23)
CO2: 24 mmol/L (ref 22–32)
Calcium: 9.6 mg/dL (ref 8.9–10.3)
Chloride: 97 mmol/L — ABNORMAL LOW (ref 98–111)
Creatinine, Ser: 1.49 mg/dL — ABNORMAL HIGH (ref 0.44–1.00)
GFR, Estimated: 35 mL/min — ABNORMAL LOW (ref >60)
Glucose, Bld: 99 mg/dL (ref 70–99)
Potassium: 4.8 mmol/L (ref 3.5–5.1)
Sodium: 135 mmol/L (ref 135–145)

## 2024-04-11 NOTE — ED Triage Notes (Signed)
 Pt has c/o SHOB related to A-fib, but pt states it has gotten worse over the last few days. Pt also has some numbness in her left hand.

## 2024-04-11 NOTE — ED Provider Notes (Signed)
 [facility] EMERGENCY DEPARTMENT AT [facility] Provider Note   CSN: [id number] Arrival date & time: 04/11/24  1417     Patient presents with: Shortness of Breath   Pam [name] is a 83 y.o. female.   83 year old female with prior medical history as detailed below presents for evaluation.  Patient reports mild shortness of breath over the last 3 to 4 days.  She denies chest pain.  She denies nausea or vomiting.  She denies fever.  She denies cough.  She is compliant with previously prescribed Eliquis .  She reports that she is in A-fib all the time.    The history is provided by the patient and medical records.       Prior to Admission medications   Medication Sig Start Date End Date Taking? Authorizing Provider  bisacodyl (DULCOLAX) 5 MG EC tablet Take 5 mg by mouth daily as needed for mild constipation.     [provider]  dapagliflozin  propanediol (FARXIGA ) 10 MG TABS tablet Take 1 tablet (10 mg total) by mouth daily before breakfast. 03/18/24   Lavona Agent, MD  ELIQUIS  5 MG TABS tablet Take 5 mg by mouth 2 (two) times daily. 08/06/22   [provider]  fexofenadine (ALLEGRA) 180 MG tablet Take 180 mg by mouth daily.    [provider]  furosemide  (LASIX ) 20 MG tablet Take 2 tablets by mouth once daily 03/18/24   Lavona Agent, MD  gabapentin  (NEURONTIN ) 300 MG capsule Take 300 mg by mouth at bedtime. 02/12/22   [provider]  metoprolol  tartrate (LOPRESSOR ) 50 MG tablet Take 1.5 tablets (75 mg total) by mouth 2 (two) times daily. 10/24/22   Lavona Agent, MD  rOPINIRole  (REQUIP ) 1 MG tablet Take 1 mg by mouth at bedtime. 04/05/17   [provider]  rosuvastatin (CRESTOR) 5 MG tablet Take 5 mg by mouth at bedtime. 08/14/22   [provider]  traMADol  (ULTRAM ) 50 MG tablet Take 50 mg by mouth every 12 (twelve) hours as needed (pain).    [provider]    Allergies: Aspirin, Ciprofloxacin , Flagyl   [metronidazole ], and Penicillins    Review of Systems  All other systems reviewed and are negative.   Updated Vital Signs BP (!) 91/58 (BP Location: Right Arm)   Pulse 60   Temp 97.6 F (36.4 C)   Resp 17   SpO2 95%   Physical Exam Vitals and nursing note reviewed.  Constitutional:      General: She is not in acute distress.    Appearance: Normal appearance. She is well-developed.  HENT:     Head: Normocephalic and atraumatic.  Eyes:     Conjunctiva/sclera: Conjunctivae normal.     Pupils: Pupils are equal, round, and reactive to light.  Cardiovascular:     Rate and Rhythm: Normal rate. Rhythm irregular.     Heart sounds: Normal heart sounds.  Pulmonary:     Effort: Pulmonary effort is normal. No respiratory distress.     Breath sounds: Normal breath sounds.  Abdominal:     General: There is no distension.     Palpations: Abdomen is soft.     Tenderness: There is no abdominal tenderness.  Musculoskeletal:        General: No deformity. Normal range of motion.     Cervical back: Normal range of motion and neck supple.  Skin:    General: Skin is warm and dry.  Neurological:     General: No focal deficit present.  Mental Status: She is alert and oriented to person, place, and time.     (all labs ordered are listed, but only abnormal results are displayed) Labs Reviewed  BASIC METABOLIC PANEL WITH GFR - Abnormal; Notable for the following components:      Result Value   Chloride 97 (*)    BUN 43 (*)    Creatinine, Ser 1.49 (*)    GFR, Estimated 35 (*)    All other components within normal limits  CBC    EKG: EKG Interpretation Date/Time:  Saturday April 11 2024 14:40:52 EDT Ventricular Rate:  104 PR Interval:    QRS Duration:  84 QT Interval:  324 QTC Calculation: 426 R Axis:   6  Text Interpretation: atrial flutter. Cannot rule out Anterior infarct , age undetermined Abnormal ECG When compared with ECG of 06-Mar-2023 11:29, PREVIOUS ECG IS PRESENT  no sig change from previous Confirmed by Armenta Canning [phone]) on 04/11/2024 2:47:30 PM  Radiology: DG Chest 2 View Result Date: 04/11/2024 EXAM: 2 VIEW(S) XRAY OF THE CHEST 04/11/2024 03:02:54 PM COMPARISON: None available. CLINICAL HISTORY: Pt c/o SOB and left hand numbness recently, worsening. H/o a-fib. FINDINGS: LUNGS AND PLEURA: No focal pulmonary opacity. No pulmonary edema. No pleural effusion. No pneumothorax. HEART AND MEDIASTINUM: Calcified aorta. No acute abnormality of the cardiac and mediastinal silhouettes. BONES AND SOFT TISSUES: Left mastectomy. Thoracic degenerative changes. No acute osseous abnormality. IMPRESSION: 1. No acute cardiopulmonary disease. 2. 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 4. Thoracic degenerative changes. Electronically signed by: Waddell Calk MD 04/11/2024 03:20 PM EDT RP Workstation: [id number]     Procedures   Medications Ordered in the ED - No data to display                                  Medical Decision Making Patient presents with complaint of shortness of breath.  Initial exam is reassuring.  Patient is in A-fib without RVR.  On room air the patient is satting 98 to 100%.  Initial screening labs are without acute abnormality. Chest x-ray is without acute abnormality.    Patient was advised that additional workup would include CT imaging.  Patient declines this.  She now reports that she feels much better and desires discharge home.  Importance of close follow-up stressed.  Strict return precautions given understood.  Patient has capacity to refuse additional workup and/or care.      Amount and/or Complexity of Data Reviewed Labs: ordered. Radiology: ordered.        Final diagnoses:  Dyspnea, unspecified type    ED Discharge Orders     None          Laurice Maude BROCKS, MD 04/11/24 2159

## 2024-04-11 NOTE — Discharge Instructions (Signed)
 Return for any problem.  ?

## 2024-04-21 DIAGNOSIS — I480 Paroxysmal atrial fibrillation: Secondary | ICD-10-CM | POA: Diagnosis not present

## 2024-04-21 DIAGNOSIS — I5032 Chronic diastolic (congestive) heart failure: Secondary | ICD-10-CM | POA: Diagnosis not present

## 2024-04-21 DIAGNOSIS — Z6822 Body mass index (BMI) 22.0-22.9, adult: Secondary | ICD-10-CM | POA: Diagnosis not present

## 2024-04-24 DIAGNOSIS — I5032 Chronic diastolic (congestive) heart failure: Secondary | ICD-10-CM | POA: Diagnosis not present

## 2024-04-24 DIAGNOSIS — I480 Paroxysmal atrial fibrillation: Secondary | ICD-10-CM | POA: Diagnosis not present

## 2024-05-07 DIAGNOSIS — I420 Dilated cardiomyopathy: Secondary | ICD-10-CM | POA: Insufficient documentation

## 2024-05-07 NOTE — Progress Notes (Signed)
 Cardiology Office Note:   Date:  05/08/2024  ID:  Pam, [name] [birth year], MRN [id number] PCP: Orpha Yancey LABOR, MD  [facility] Providers Cardiologist:  Lynwood Schilling, MD {  History of Present Illness:   Pam [name] is a 83 y.o. female who was referred by Orpha Yancey LABOR, MD for evaluation of atrial fib.   Echo was unremarkable. She had cardioversion but only stayed in sinus rhythm for about two days.   She went back into atrial fib.  She is being managed with rate control.  She has had increased SOB.  I sent her for a CPX which demonstrated evidence of diastolic dysfunction and chronotropic incompetence.  She has had a previously elevated BNP.  We have increased her Lasix  and her creatinine has crept up but stabilized.    Since she was last seen she has had increasing shortness of breath.  She was actually in the emergency room on 920 and I looked at these notes.  Chest x-ray was unremarkable.  Oxygen saturations were okay.  EKG demonstrated sinus rhythm.  I do not see a BNP.  She did go to see her primary care doctor and I was able to see some labs.  Creatinine was 1.36.  proBNP was 2294.  He is opting for metoprolol  and starting Coreg.  He added spironolactone.  He increased his diuretic.  She did have some shortness of breath yesterday.  She has not been getting any increased swelling.  She does have atrial fibrillation with rates that her daughter thinks have been a little higher recently.  She is not describing new PND or orthopnea.  Her breathing seems to be more episodic.  She is not having any new chest discomfort.       ROS: As stated in the HPI and negative for all other systems.  Studies Reviewed:    EKG:     Atrial fibrillation, rate 104, axis within normal limits, low voltage in the chest leads, no acute ST-T wave changes.  Risk Assessment/Calculations:    CHA2DS2-VASc Score = 3   This indicates a 3.2% annual risk of stroke. The patient's score is based  upon: CHF History: 0 HTN History: 0 Diabetes History: 0 Stroke History: 0 Vascular Disease History: 0 Age Score: 2 Gender Score: 1    Physical Exam:   VS:  BP (!) 96/57 (BP Location: Right Arm, Patient Position: Sitting, Cuff Size: Normal)   Pulse 85   Ht 5' 3 (1.6 m)   Wt 133 lb 3.2 oz (60.4 kg)   SpO2 95%   BMI 23.60 kg/m    Wt Readings from Last 3 Encounters:  05/08/24 133 lb 3.2 oz (60.4 kg)  03/06/23 137 lb (62.1 kg)  10/24/22 136 lb (61.7 kg)     GEN: Well nourished, well developed in no acute distress NECK: No JVD; No carotid bruits CARDIAC: Irregular RR, no murmurs, rubs, gallops RESPIRATORY:  Clear to auscultation without rales, wheezing or rhonchi  ABDOMEN: Soft, non-tender, non-distended EXTREMITIES:  No edema; No deformity   ASSESSMENT AND PLAN:   ATRIAL FIB:    The patient has persistent atrial fibrillation.   I do not think that rhythm control would be successful but we could still consider an ablation in the future.  She tolerates anticoagulation.  She has previously had good rate control and they can watch this with a pulse oximeter and I can consider further monitoring.    SOB:      Perfusion  study was negative for ischemia.  CPX is suggested the problem was diastolic dysfunction and chronotropic incompetence.  Therefore, I am hesitant to increase her beta-blocker.  I think is very reasonable to try new formulation with Coreg.  I agree that volume is the issue and she is on the higher dose of diuretic.  Spironolactone is reasonable.  We gave her education about salt again because she does use processed foods.  I am going to repeat an echocardiogram.    Follow up with me in two months.   Signed, Lynwood Schilling, MD

## 2024-05-08 ENCOUNTER — Ambulatory Visit: Attending: Cardiology | Admitting: Cardiology

## 2024-05-08 ENCOUNTER — Encounter: Payer: Self-pay | Admitting: Cardiology

## 2024-05-08 VITALS — BP 96/57 | HR 85 | Ht 63.0 in | Wt 133.2 lb

## 2024-05-08 DIAGNOSIS — I4821 Permanent atrial fibrillation: Secondary | ICD-10-CM

## 2024-05-08 DIAGNOSIS — R0602 Shortness of breath: Secondary | ICD-10-CM | POA: Diagnosis not present

## 2024-05-08 DIAGNOSIS — I420 Dilated cardiomyopathy: Secondary | ICD-10-CM | POA: Diagnosis not present

## 2024-05-08 MED ORDER — FUROSEMIDE 40 MG PO TABS: 40.0000 mg | ORAL_TABLET | Freq: Two times a day (BID) | ORAL | Status: AC

## 2024-05-08 NOTE — Patient Instructions (Signed)
 Medication Instructions:  Your physician recommends that you continue on your current medications as directed. Please refer to the Current Medication list given to you today.  *If you need a refill on your cardiac medications before your next appointment, please call your pharmacy*  Lab Work: NONE If you have labs (blood work) drawn today and your tests are completely normal, you will receive your results only by: MyChart Message (if you have MyChart) OR A paper copy in the mail If you have any lab test that is abnormal or we need to change your treatment, we will call you to review the results.  Testing/Procedures: Echocardiogram Your physician has requested that you have an echocardiogram. Echocardiography is a painless test that uses sound waves to create images of your heart. It provides your doctor with information about the size and shape of your heart and how well your heart's chambers and valves are working. This procedure takes approximately one hour. There are no restrictions for this procedure. Please do NOT wear cologne, perfume, aftershave, or lotions (deodorant is allowed). Please arrive 15 minutes prior to your appointment time.  Please note: We ask at that you not bring children with you during ultrasound (echo/ vascular) testing. Due to room size and safety concerns, children are not allowed in the ultrasound rooms during exams. Our front office staff cannot provide observation of children in our lobby area while testing is being conducted. An adult accompanying a patient to their appointment will only be allowed in the ultrasound room at the discretion of the ultrasound technician under special circumstances. We apologize for any inconvenience.   Follow-Up: At [facility], you and your health needs are our priority.  As part of our continuing mission to provide you with exceptional heart care, our providers are all part of one team.  This team includes your primary  Cardiologist (physician) and Advanced Practice Providers or APPs (Physician Assistants and Nurse Practitioners) who all work together to provide you with the care you need, when you need it.  Your next appointment:   2 month(s)  Provider:   Lynwood Schilling, MD    We recommend signing up for the patient portal called MyChart.  Sign up information is provided on this After Visit Summary.  MyChart is used to connect with patients for Virtual Visits (Telemedicine).  Patients are able to view lab/test results, encounter notes, upcoming appointments, etc.  Non-urgent messages can be sent to your provider as well.   To learn more about what you can do with MyChart, go to [email or web address].

## 2024-05-19 DIAGNOSIS — N1832 Chronic kidney disease, stage 3b: Secondary | ICD-10-CM | POA: Diagnosis not present

## 2024-05-19 DIAGNOSIS — I5032 Chronic diastolic (congestive) heart failure: Secondary | ICD-10-CM | POA: Diagnosis not present

## 2024-05-19 DIAGNOSIS — I480 Paroxysmal atrial fibrillation: Secondary | ICD-10-CM | POA: Diagnosis not present

## 2024-05-19 DIAGNOSIS — Z6823 Body mass index (BMI) 23.0-23.9, adult: Secondary | ICD-10-CM | POA: Diagnosis not present

## 2024-05-27 ENCOUNTER — Ambulatory Visit: Admitting: Cardiology

## 2024-06-01 ENCOUNTER — Other Ambulatory Visit: Payer: Self-pay | Admitting: Cardiology

## 2024-06-01 ENCOUNTER — Emergency Department (HOSPITAL_COMMUNITY)

## 2024-06-01 ENCOUNTER — Encounter (HOSPITAL_COMMUNITY): Payer: Self-pay

## 2024-06-01 ENCOUNTER — Other Ambulatory Visit: Payer: Self-pay

## 2024-06-01 ENCOUNTER — Inpatient Hospital Stay (HOSPITAL_COMMUNITY)
Admission: EM | Admit: 2024-06-01 | Discharge: 2024-06-09 | DRG: 480 | Disposition: A | Discharge status: Skilled Nursing Facility | Attending: Emergency Medicine | Admitting: Emergency Medicine

## 2024-06-01 DIAGNOSIS — I5032 Chronic diastolic (congestive) heart failure: Secondary | ICD-10-CM | POA: Diagnosis present

## 2024-06-01 DIAGNOSIS — R339 Retention of urine, unspecified: Secondary | ICD-10-CM | POA: Diagnosis present

## 2024-06-01 DIAGNOSIS — I4819 Other persistent atrial fibrillation: Secondary | ICD-10-CM | POA: Diagnosis not present

## 2024-06-01 DIAGNOSIS — D72829 Elevated white blood cell count, unspecified: Secondary | ICD-10-CM | POA: Diagnosis present

## 2024-06-01 DIAGNOSIS — I5033 Acute on chronic diastolic (congestive) heart failure: Secondary | ICD-10-CM

## 2024-06-01 DIAGNOSIS — I130 Hypertensive heart and chronic kidney disease with heart failure and stage 1 through stage 4 chronic kidney disease, or unspecified chronic kidney disease: Secondary | ICD-10-CM | POA: Diagnosis present

## 2024-06-01 DIAGNOSIS — Z881 Allergy status to other antibiotic agents status: Secondary | ICD-10-CM

## 2024-06-01 DIAGNOSIS — D62 Acute posthemorrhagic anemia: Secondary | ICD-10-CM | POA: Diagnosis not present

## 2024-06-01 DIAGNOSIS — W010XXA Fall on same level from slipping, tripping and stumbling without subsequent striking against object, initial encounter: Secondary | ICD-10-CM | POA: Diagnosis present

## 2024-06-01 DIAGNOSIS — Q2112 Patent foramen ovale: Secondary | ICD-10-CM

## 2024-06-01 DIAGNOSIS — E860 Dehydration: Secondary | ICD-10-CM | POA: Diagnosis not present

## 2024-06-01 DIAGNOSIS — N179 Acute kidney failure, unspecified: Secondary | ICD-10-CM | POA: Diagnosis present

## 2024-06-01 DIAGNOSIS — E871 Hypo-osmolality and hyponatremia: Secondary | ICD-10-CM | POA: Diagnosis present

## 2024-06-01 DIAGNOSIS — Z7901 Long term (current) use of anticoagulants: Secondary | ICD-10-CM

## 2024-06-01 DIAGNOSIS — S72001A Fracture of unspecified part of neck of right femur, initial encounter for closed fracture: Secondary | ICD-10-CM | POA: Diagnosis not present

## 2024-06-01 DIAGNOSIS — Z9012 Acquired absence of left breast and nipple: Secondary | ICD-10-CM

## 2024-06-01 DIAGNOSIS — I10 Essential (primary) hypertension: Secondary | ICD-10-CM | POA: Diagnosis not present

## 2024-06-01 DIAGNOSIS — Z8249 Family history of ischemic heart disease and other diseases of the circulatory system: Secondary | ICD-10-CM | POA: Diagnosis not present

## 2024-06-01 DIAGNOSIS — Z90710 Acquired absence of both cervix and uterus: Secondary | ICD-10-CM

## 2024-06-01 DIAGNOSIS — Z8042 Family history of malignant neoplasm of prostate: Secondary | ICD-10-CM

## 2024-06-01 DIAGNOSIS — D6832 Hemorrhagic disorder due to extrinsic circulating anticoagulants: Secondary | ICD-10-CM | POA: Diagnosis present

## 2024-06-01 DIAGNOSIS — I4891 Unspecified atrial fibrillation: Secondary | ICD-10-CM | POA: Diagnosis not present

## 2024-06-01 DIAGNOSIS — E785 Hyperlipidemia, unspecified: Secondary | ICD-10-CM | POA: Diagnosis present

## 2024-06-01 DIAGNOSIS — R578 Other shock: Secondary | ICD-10-CM | POA: Diagnosis present

## 2024-06-01 DIAGNOSIS — S72141A Displaced intertrochanteric fracture of right femur, initial encounter for closed fracture: Principal | ICD-10-CM | POA: Diagnosis present

## 2024-06-01 DIAGNOSIS — S7291XA Unspecified fracture of right femur, initial encounter for closed fracture: Secondary | ICD-10-CM | POA: Insufficient documentation

## 2024-06-01 DIAGNOSIS — I4821 Permanent atrial fibrillation: Secondary | ICD-10-CM | POA: Diagnosis present

## 2024-06-01 DIAGNOSIS — Z853 Personal history of malignant neoplasm of breast: Secondary | ICD-10-CM

## 2024-06-01 DIAGNOSIS — Z888 Allergy status to other drugs, medicaments and biological substances status: Secondary | ICD-10-CM

## 2024-06-01 DIAGNOSIS — Z96653 Presence of artificial knee joint, bilateral: Secondary | ICD-10-CM | POA: Diagnosis present

## 2024-06-01 DIAGNOSIS — Z8541 Personal history of malignant neoplasm of cervix uteri: Secondary | ICD-10-CM

## 2024-06-01 DIAGNOSIS — S7291XD Unspecified fracture of right femur, subsequent encounter for closed fracture with routine healing: Secondary | ICD-10-CM | POA: Diagnosis not present

## 2024-06-01 DIAGNOSIS — Z803 Family history of malignant neoplasm of breast: Secondary | ICD-10-CM | POA: Diagnosis not present

## 2024-06-01 DIAGNOSIS — Z886 Allergy status to analgesic agent status: Secondary | ICD-10-CM | POA: Diagnosis not present

## 2024-06-01 DIAGNOSIS — E861 Hypovolemia: Secondary | ICD-10-CM | POA: Diagnosis present

## 2024-06-01 DIAGNOSIS — N1832 Chronic kidney disease, stage 3b: Secondary | ICD-10-CM | POA: Diagnosis present

## 2024-06-01 DIAGNOSIS — Y92008 Other place in unspecified non-institutional (private) residence as the place of occurrence of the external cause: Secondary | ICD-10-CM | POA: Diagnosis not present

## 2024-06-01 DIAGNOSIS — Z880 Allergy status to penicillin: Secondary | ICD-10-CM

## 2024-06-01 DIAGNOSIS — N189 Chronic kidney disease, unspecified: Secondary | ICD-10-CM

## 2024-06-01 DIAGNOSIS — Z79899 Other long term (current) drug therapy: Secondary | ICD-10-CM | POA: Diagnosis not present

## 2024-06-01 DIAGNOSIS — Z87891 Personal history of nicotine dependence: Secondary | ICD-10-CM

## 2024-06-01 LAB — CBC WITH DIFFERENTIAL/PLATELET
Abs Immature Granulocytes: 0.09 K/uL — ABNORMAL HIGH (ref 0.00–0.07)
Basophils Absolute: 0 K/uL (ref 0.0–0.1)
Basophils Relative: 0 %
Eosinophils Absolute: 0.2 K/uL (ref 0.0–0.5)
Eosinophils Relative: 2 %
HCT: 34.9 % — ABNORMAL LOW (ref 36.0–46.0)
Hemoglobin: 11.8 g/dL — ABNORMAL LOW (ref 12.0–15.0)
Immature Granulocytes: 1 %
Lymphocytes Relative: 9 %
Lymphs Abs: 1.1 K/uL (ref 0.7–4.0)
MCH: 30.3 pg (ref 26.0–34.0)
MCHC: 33.8 g/dL (ref 30.0–36.0)
MCV: 89.5 fL (ref 80.0–100.0)
Monocytes Absolute: 0.7 K/uL (ref 0.1–1.0)
Monocytes Relative: 6 %
Neutro Abs: 10 K/uL — ABNORMAL HIGH (ref 1.7–7.7)
Neutrophils Relative %: 82 %
Platelets: 304 K/uL (ref 150–400)
RBC: 3.9 MIL/uL (ref 3.87–5.11)
RDW: 13 % (ref 11.5–15.5)
WBC: 12.1 K/uL — ABNORMAL HIGH (ref 4.0–10.5)
nRBC: 0 % (ref 0.0–0.2)

## 2024-06-01 LAB — BASIC METABOLIC PANEL WITH GFR
Anion gap: 12 (ref 5–15)
BUN: 43 mg/dL — ABNORMAL HIGH (ref 8–23)
CO2: 26 mmol/L (ref 22–32)
Calcium: 8.8 mg/dL — ABNORMAL LOW (ref 8.9–10.3)
Chloride: 89 mmol/L — ABNORMAL LOW (ref 98–111)
Creatinine, Ser: 1.87 mg/dL — ABNORMAL HIGH (ref 0.44–1.00)
GFR, Estimated: 27 mL/min — ABNORMAL LOW (ref >60)
Glucose, Bld: 116 mg/dL — ABNORMAL HIGH (ref 70–99)
Potassium: 5.2 mmol/L — ABNORMAL HIGH (ref 3.5–5.1)
Sodium: 126 mmol/L — ABNORMAL LOW (ref 135–145)

## 2024-06-01 LAB — PROTIME-INR
INR: 1.5 — ABNORMAL HIGH (ref 0.8–1.2)
Prothrombin Time: 19.4 s — ABNORMAL HIGH (ref 11.4–15.2)

## 2024-06-01 MED ORDER — LACTATED RINGERS IV BOLUS
1000.0000 mL | Freq: Once | INTRAVENOUS | Status: AC
  Administered 2024-06-01: 1000 mL via INTRAVENOUS

## 2024-06-01 MED ORDER — POLYETHYLENE GLYCOL 3350 17 G PO PACK
17.0000 g | PACK | Freq: Every day | ORAL | Status: DC | PRN
  Administered 2024-06-02: 17 g via ORAL
  Filled 2024-06-01: qty 1

## 2024-06-01 MED ORDER — FENTANYL CITRATE (PF) 100 MCG/2ML IJ SOLN
50.0000 ug | INTRAMUSCULAR | Status: AC | PRN
  Administered 2024-06-01 (×2): 50 ug via INTRAVENOUS
  Filled 2024-06-01 (×2): qty 2

## 2024-06-01 MED ORDER — ROSUVASTATIN CALCIUM 10 MG PO TABS
5.0000 mg | ORAL_TABLET | Freq: Every day | ORAL | Status: DC
  Administered 2024-06-02 – 2024-06-08 (×8): 5 mg via ORAL
  Filled 2024-06-01 (×8): qty 1

## 2024-06-01 MED ORDER — OXYCODONE HCL 5 MG PO TABS
5.0000 mg | ORAL_TABLET | ORAL | Status: AC | PRN
  Administered 2024-06-02 (×2): 5 mg via ORAL
  Administered 2024-06-02 – 2024-06-04 (×4): 10 mg via ORAL
  Filled 2024-06-01: qty 1
  Filled 2024-06-01 (×3): qty 2
  Filled 2024-06-01 (×2): qty 1
  Filled 2024-06-01: qty 2

## 2024-06-01 MED ORDER — ROPINIROLE HCL 1 MG PO TABS
1.0000 mg | ORAL_TABLET | Freq: Every day | ORAL | Status: DC
  Administered 2024-06-02 – 2024-06-08 (×8): 1 mg via ORAL
  Filled 2024-06-01 (×8): qty 1

## 2024-06-01 MED ORDER — HYDROMORPHONE HCL 1 MG/ML IJ SOLN
0.5000 mg | INTRAMUSCULAR | Status: AC
  Administered 2024-06-01: 0.5 mg via INTRAVENOUS
  Filled 2024-06-01: qty 0.5

## 2024-06-01 MED ORDER — MELATONIN 3 MG PO TABS
6.0000 mg | ORAL_TABLET | Freq: Every evening | ORAL | Status: DC | PRN
  Administered 2024-06-02 – 2024-06-09 (×5): 6 mg via ORAL
  Filled 2024-06-01 (×5): qty 2

## 2024-06-01 MED ORDER — GABAPENTIN 100 MG PO CAPS
200.0000 mg | ORAL_CAPSULE | Freq: Every day | ORAL | Status: DC
  Administered 2024-06-02 – 2024-06-08 (×8): 200 mg via ORAL
  Filled 2024-06-01 (×8): qty 2

## 2024-06-01 MED ORDER — HYDROMORPHONE HCL 1 MG/ML IJ SOLN
0.5000 mg | INTRAMUSCULAR | Status: AC | PRN
  Administered 2024-06-02 – 2024-06-04 (×4): 0.5 mg via INTRAVENOUS
  Filled 2024-06-01 (×4): qty 0.5

## 2024-06-01 MED ORDER — ACETAMINOPHEN 500 MG PO TABS
500.0000 mg | ORAL_TABLET | Freq: Four times a day (QID) | ORAL | Status: DC | PRN
  Administered 2024-06-02 – 2024-06-03 (×2): 500 mg via ORAL
  Filled 2024-06-01 (×2): qty 1

## 2024-06-01 MED ORDER — DIAZEPAM 2 MG PO TABS
2.0000 mg | ORAL_TABLET | Freq: Once | ORAL | Status: AC
  Administered 2024-06-01: 2 mg via ORAL
  Filled 2024-06-01: qty 1

## 2024-06-01 MED ORDER — PROCHLORPERAZINE EDISYLATE 10 MG/2ML IJ SOLN: 5.0000 mg | Freq: Four times a day (QID) | INTRAMUSCULAR | Status: DC | PRN

## 2024-06-01 MED ORDER — SODIUM CHLORIDE 0.9 % IV SOLN: INTRAVENOUS | Status: AC

## 2024-06-01 NOTE — H&P (Signed)
 History and Physical  Pam [name] FMW:[id number] DOB: August 11, 1940 DOA: 06/01/2024  Referring physician: Dr. Charlyn, EDP  PCP: Orpha Yancey LABOR, MD  Outpatient Specialists: Cardiology Patient coming from: Home  Chief Complaint: Tripped and fell on a rug at home.  HPI: Pam [name] is a 83 y.o. female with medical history significant for permanent atrial fibrillation on Eliquis , hypertension, chronic HFpEF, CKD 3B, who presents to the ER after a mechanical fall at home.  States she was doing well prior to this fall.  She tripped on the rug in her home, fell and landed on her right hip.  This was her first fall.  In the ER, right hip x-ray shows acute impacted intertrochanteric fracture of the right femur.  EDP discussed the case with orthopedic surgery Dr. Margrette.  He is planning to take the patient to the OR on Wednesday.  Home Eliquis  held.  The patient received multiple rounds of IV opiate analgesics and 1 L IV fluid bolus.  Admitted by [facility], hospitalist service.  ED Course: Temperature 98.2.  BP 83/57, pulse 61, respiratory 18, O2 saturation 92% on room air.  Review of Systems: Review of systems as noted in the HPI. All other systems reviewed and are negative.   Past Medical History:  Diagnosis Date   Arthritis    Atrial fibrillation (HCC)    Cancer [facility]    BREAST CANCER / CERVICAL CANCER    Cervical cancer (HCC)    Colitis    Dysrhythmia    History of breast cancer 2006   Dr. Tona oncologist - left breast   History of gout    Hyperlipidemia    Self-catheterizes urinary bladder    Urinary retention Sept 2016   s/p hysterectomy   Past Surgical History:  Procedure Laterality Date   BIOPSY  09/09/2020   Procedure: BIOPSY;  Surgeon: Shaaron Lamar CHRISTELLA, MD;  [facility]: [facility];  Service: Endoscopy;;   BREAST SURGERY     CARDIOVERSION N/A 06/03/2019   Procedure: CARDIOVERSION;  Surgeon: Jeffrie Oneil BROCKS, MD;  Location: [facility] ENDOSCOPY;  Service: Cardiovascular;   Laterality: N/A;   CATARACT EXTRACTION W/ INTRAOCULAR LENS  IMPLANT, BILATERAL     COLONOSCOPY WITH PROPOFOL  N/A 09/09/2020   Procedure: COLONOSCOPY WITH PROPOFOL ;  Surgeon: Shaaron Lamar CHRISTELLA, MD;  Location: [facility];  Service: Endoscopy;  Laterality: N/A;  2:45pm   EYE SURGERY     INGUINAL HERNIA REPAIR Right 12/14/2015   Procedure: LAPAROSCOPIC AND OPEN RIGHT  INGUINAL HERNIA;  Surgeon: Donnice Lunger, MD;  Location: [facility] ORS;  Service: General;  Laterality: Right;   KNEE ARTHROSCOPY Left    MASTECTOMY Left 2006   MASTECTOMY WITH AXILLARY LYMPH NODE DISSECTION Left    ROBOTIC ASSISTED TOTAL HYSTERECTOMY WITH BILATERAL SALPINGO OOPHERECTOMY Bilateral 04/07/2015   Procedure: ROBOTIC ASSISTED RADICAL HYSTERECTOMY BILATERAL SALPINGO OOPHORECTOMY BILATERAL SENTINEL LYMPHADENETOMY;  Surgeon: Maurilio Ship, MD;  Location: [facility] ORS;  Service: Gynecology;  Laterality: Bilateral;   TOTAL KNEE ARTHROPLASTY Left 07/13/2019   Procedure: LEFT TOTAL KNEE ARTHROPLASTY-CEMENTED;  Surgeon: Barbarann Oneil BROCKS, MD;  Location: [facility] OR;  Service: Orthopedics;  Laterality: Left;   TOTAL KNEE ARTHROPLASTY Right 02/15/2020   Procedure: RIGHT TOTAL KNEE ARTHROPLASTY;  Surgeon: Barbarann Oneil BROCKS, MD;  Location: [facility] OR;  Service: Orthopedics;  Laterality: Right;   TUBAL LIGATION      Social History:  reports that she quit smoking about 29 years ago. Her smoking use included cigarettes. She started smoking about 59 years ago. She  has a 30 pack-year smoking history. She has never used smokeless tobacco. She reports that she does not drink alcohol and does not use drugs.   Allergies  Allergen Reactions   Aspirin Nausea And Vomiting   Ciprofloxacin  Diarrhea   Flagyl  [Metronidazole ] Diarrhea   Penicillins Hives    20 years  Tolerated Cephalosporin Date: 02/15/2020.      Family History  Problem Relation Age of Onset   Prostate cancer Father    Heart failure Father        52s   Heart failure Mother        45   Breast cancer  Sister    Colon polyps Sister        early 58s    Colon cancer Neg Hx       Prior to Admission medications   Medication Sig Start Date End Date Taking? Authorizing Provider  benazepril (LOTENSIN) 5 MG tablet Take 5 mg by mouth daily. 02/14/24  Yes [provider]  carvedilol (COREG) 6.25 MG tablet Take 6.25 mg by mouth 2 (two) times daily with a meal. 02/14/24  Yes [provider]  dapagliflozin  propanediol (FARXIGA ) 10 MG TABS tablet Take 1 tablet (10 mg total) by mouth daily before breakfast. 03/18/24  Yes Lavona Agent, MD  ELIQUIS  5 MG TABS tablet Take 5 mg by mouth 2 (two) times daily. 08/06/22  Yes [provider]  fexofenadine (ALLEGRA) 180 MG tablet Take 180 mg by mouth daily.   Yes [provider]  furosemide  (LASIX ) 40 MG tablet Take 1 tablet (40 mg total) by mouth 2 (two) times daily. 05/08/24  Yes Lavona Agent, MD  gabapentin  (NEURONTIN ) 300 MG capsule Take 300 mg by mouth at bedtime. 02/12/22  Yes [provider]  rOPINIRole  (REQUIP ) 1 MG tablet Take 1 mg by mouth at bedtime. 04/05/17  Yes [provider]  rosuvastatin (CRESTOR) 5 MG tablet Take 5 mg by mouth at bedtime. 08/14/22  Yes [provider]  spironolactone (ALDACTONE) 25 MG tablet Take 25 mg by mouth daily. 02/14/24  Yes [provider]  traMADol  (ULTRAM ) 50 MG tablet Take 50 mg by mouth every 12 (twelve) hours as needed (pain).   Yes [provider]    Physical Exam: BP (!) 124/91 (BP Location: Right Arm)   Pulse (!) 123   Temp 97.7 F (36.5 C) (Oral)   Resp 18   Ht 5' 3 (1.6 m)   Wt 61.2 kg   SpO2 96%   BMI 23.91 kg/m   General: 83 y.o. year-old female well developed well nourished in no acute distress.  Alert and oriented x3. Cardiovascular: Regular rate and rhythm with no rubs or gallops.  No thyromegaly or JVD noted.  No lower extremity edema. 2/4 pulses in all 4 extremities. Respiratory: Clear to auscultation with no wheezes  or rales. Good inspiratory effort. Abdomen: Soft nontender nondistended with normal bowel sounds x4 quadrants. Muskuloskeletal: No cyanosis, clubbing or edema noted bilaterally.  Right lower extremity is externally rotated. Neuro: CN II-XII intact, strength, sensation, reflexes Skin: No ulcerative lesions noted or rashes Psychiatry: Judgement and insight appear normal. Mood is appropriate for condition and setting          Labs on Admission:  Basic Metabolic Panel: Recent Labs  Lab 06/01/24 1853  NA 126*  K 5.2*  CL 89*  CO2 26  GLUCOSE 116*  BUN 43*  CREATININE 1.87*  CALCIUM 8.8*   Liver Function Tests: No results for input(s):  AST, ALT, ALKPHOS, BILITOT, PROT, ALBUMIN in the last 168 hours. No results for input(s): LIPASE, AMYLASE in the last 168 hours. No results for input(s): AMMONIA in the last 168 hours. CBC: Recent Labs  Lab 06/01/24 1853  WBC 12.1*  NEUTROABS 10.0*  HGB 11.8*  HCT 34.9*  MCV 89.5  PLT 304   Cardiac Enzymes: No results for input(s): CKTOTAL, CKMB, CKMBINDEX, TROPONINI in the last 168 hours.  BNP (last 3 results) No results for input(s): BNP in the last 8760 hours.  ProBNP (last 3 results) No results for input(s): PROBNP in the last 8760 hours.  CBG: No results for input(s): GLUCAP in the last 168 hours.  Radiological Exams on Admission: CT Head Wo Contrast Result Date: 06/01/2024 EXAM: CT HEAD WITHOUT CONTRAST 06/01/2024 09:00:33 PM TECHNIQUE: CT of the head was performed without the administration of intravenous contrast. Automated exposure control, iterative reconstruction, and/or weight based adjustment of the mA/kV was utilized to reduce the radiation dose to as low as reasonably achievable. COMPARISON: None available. CLINICAL HISTORY: Head trauma, moderate-severe. FINDINGS: BRAIN AND VENTRICLES: No acute hemorrhage. No evidence of acute infarct. No hydrocephalus. No extra-axial collection. No mass  effect or midline shift. Mild periventricular white matter disease. Age-related volume loss. Mild vascular calcifications. ORBITS: No acute abnormality. Status post cataract surgery. SINUSES: No acute abnormality. SOFT TISSUES AND SKULL: No acute soft tissue abnormality. No skull fracture. IMPRESSION: 1. No acute intracranial abnormality. Electronically signed by: Pinkie Pebbles MD 06/01/2024 09:03 PM EST RP Workstation: [id number]   DG Chest 1 View Result Date: 06/01/2024 EXAM: 1 VIEW(S) XRAY OF THE CHEST 06/01/2024 07:15:00 PM COMPARISON: 04/11/2024 CLINICAL HISTORY: chest pain FINDINGS: LUNGS AND PLEURA: Low lung volumes with bronchovascular crowding. No pleural effusion. No pneumothorax. HEART AND MEDIASTINUM: Aortic tortuosity with atherosclerosis. No cardiomegaly. BONES AND SOFT TISSUES: Surgical clips left axilla. No acute osseous abnormality. JOINTS: Bilateral shoulder osteoarthritis. DISCS/DEGENERATIVE CHANGES: Multilevel degenerative disc disease of the spine. IMPRESSION: 1. Lung volumes. No acute cardiopulmonary abnormality. Electronically signed by: Rogelia Myers MD 06/01/2024 07:25 PM EST RP Workstation: [id number]   DG Hip Unilat With Pelvis 2-3 Views Right Result Date: 06/01/2024 EXAM: 2 OR MORE VIEW(S) XRAY OF THE RIGHT HIP 06/01/2024 07:13:00 PM COMPARISON: None available. CLINICAL HISTORY: fall FINDINGS: BONES AND JOINTS: Acute impacted intertrochanteric fracture of the right femur with varus angulation. Mild bilateral hip degenerative changes. SOFT TISSUES: The soft tissues are unremarkable. IMPRESSION: 1. Acute impacted intertrochanteric fracture of the right femur. Electronically signed by: Pinkie Pebbles MD 06/01/2024 07:18 PM EST RP Workstation: [id number]    EKG: I independently viewed the EKG done and my findings are as followed: Atrial fibrillation rate of 86.  Nonspecific ST-T changes QTc 459.  Assessment/Plan Present on Admission:  Femur fracture, right  (HCC)  Principal Problem:   Femur fracture, right (HCC)  Acute impacted intertrochanteric fracture of the right femur post mechanical fall, POA Right hip x-ray shows acute impacted intertrochanteric fracture of the right femur.  Pain control as needed Orthopedic surgery, Dr. Margrette, consulted. Plan for repair on 06/03/2024  Euvolemic hyponatremia Serum sodium 126 IV fluid hydration NS at 75 cc/h x 1 day Follow serum osmolality, urine osmolality, and urine sodium Repeat BMP.  AKI on CKD 3B, suspect prerenal, likely from dehydration Presented with BUN 43, creatinine 1.87 Baseline creatinine 1.49 with GFR of 35 Avoid nephrotoxic agents, dehydration, and hypotension Monitor urine output Repeat BMP in the morning.  Permanent atrial fibrillation Continue to hold of home Eliquis  Start heparin  drip  for CVA prevention Monitor on telemetry.  Chronic HFpEF Closely monitor volume status while on IV fluid Strict I's and O's and daily weight  Leukocytosis likely reactive from fracture WBC 12.1 Afebrile and no symptoms prior to the fall.  Fall Noncontrast head CT with no acute intracranial abnormality. Right hip x-ray shows acute impacted intertrochanteric fracture of the right femur. Continue fall precautions.   Critical care time: 55 minutes.   DVT prophylaxis: Heparin  drip for CVA prevention in the setting of permanent atrial fibrillation, home Eliquis  on hold.  Code Status: Full code.  Family Communication: Daughter at bedside.  Disposition Plan: Admitted to telemetry unit.  Consults called: Orthopedic surgery.  Admission status: Inpatient status.   Status is: Inpatient The patient requires at least 2 midnights for further evaluation and treatment of present condition.   Terry LOISE Hurst MD [facility] Pager [phone]  If 7PM-7AM, please contact night-coverage [email or web address] Password [email or web address]  06/01/2024, 11:54 PM

## 2024-06-01 NOTE — ED Provider Notes (Addendum)
 [place] EMERGENCY DEPARTMENT AT [facility] Provider Note   CSN: [id number] Arrival date & time: 06/01/24  8177     Patient presents with: Pam [name] is a 83 y.o. female.   HPI     83 year old female comes in with chief complaint of mechanical fall.  Patient has history of A-fib for which she is on Eliquis .  She states that she was turning, and slipped over her rug.  In the process she fell onto her right hip.  She has severe pain.  She was unable to get up.  She denies any associated numbness, tingling.  Patient does not have any headache, neck pain, abdominal pain, chest pain, shortness of breath.  Prior to Admission medications   Medication Sig Start Date End Date Taking? Authorizing Provider  benazepril (LOTENSIN) 5 MG tablet Take 5 mg by mouth daily. 02/14/24   [provider]  bisacodyl (DULCOLAX) 5 MG EC tablet Take 5 mg by mouth daily as needed for mild constipation.     [provider]  carvedilol (COREG) 6.25 MG tablet Take 6.25 mg by mouth 2 (two) times daily. 02/14/24   [provider]  dapagliflozin  propanediol (FARXIGA ) 10 MG TABS tablet Take 1 tablet (10 mg total) by mouth daily before breakfast. 03/18/24   Lavona Agent, MD  ELIQUIS  5 MG TABS tablet Take 5 mg by mouth 2 (two) times daily. 08/06/22   [provider]  fexofenadine (ALLEGRA) 180 MG tablet Take 180 mg by mouth daily.    [provider]  furosemide  (LASIX ) 40 MG tablet Take 1 tablet (40 mg total) by mouth 2 (two) times daily. 05/08/24   Lavona Agent, MD  gabapentin  (NEURONTIN ) 300 MG capsule Take 300 mg by mouth at bedtime. 02/12/22   [provider]  metoprolol  tartrate (LOPRESSOR ) 50 MG tablet Take 1.5 tablets (75 mg total) by mouth 2 (two) times daily. Patient not taking: Reported on 05/08/2024 10/24/22   Lavona Agent, MD  rOPINIRole  (REQUIP ) 1 MG tablet Take 1 mg by mouth at bedtime. 04/05/17   [provider]   rosuvastatin (CRESTOR) 5 MG tablet Take 5 mg by mouth at bedtime. 08/14/22   [provider]  spironolactone (ALDACTONE) 25 MG tablet Take 25 mg by mouth daily. 02/14/24   [provider]  traMADol  (ULTRAM ) 50 MG tablet Take 50 mg by mouth every 12 (twelve) hours as needed (pain). Patient not taking: Reported on 05/08/2024    [provider]    Allergies: Aspirin, Ciprofloxacin , Flagyl  [metronidazole ], and Penicillins    Review of Systems  All other systems reviewed and are negative.   Updated Vital Signs BP 121/65   Pulse 79   Temp (!) 96.4 F (35.8 C) (Temporal)   Resp 20   Ht 5' 3 (1.6 m)   Wt 61.2 kg   SpO2 95%   BMI 23.91 kg/m   Physical Exam Vitals and nursing note reviewed.  Constitutional:      Appearance: She is well-developed.  HENT:     Head: Atraumatic.  Cardiovascular:     Rate and Rhythm: Normal rate.  Pulmonary:     Effort: Pulmonary effort is normal.  Musculoskeletal:        General: Tenderness and deformity present.     Cervical back: Normal range of motion and neck supple.     Comments: Left lower extremity appears slightly shortened and rotated externally  Skin:    General: Skin is warm and  dry.  Neurological:     Mental Status: She is alert and oriented to person, place, and time.     Sensory: No sensory deficit.     (all labs ordered are listed, but only abnormal results are displayed) Labs Reviewed  BASIC METABOLIC PANEL WITH GFR - Abnormal; Notable for the following components:      Result Value   Sodium 126 (*)    Potassium 5.2 (*)    Chloride 89 (*)    Glucose, Bld 116 (*)    BUN 43 (*)    Creatinine, Ser 1.87 (*)    Calcium 8.8 (*)    GFR, Estimated 27 (*)    All other components within normal limits  CBC WITH DIFFERENTIAL/PLATELET - Abnormal; Notable for the following components:   WBC 12.1 (*)    Hemoglobin 11.8 (*)    HCT 34.9 (*)    Neutro Abs 10.0 (*)    Abs Immature Granulocytes 0.09 (*)     All other components within normal limits  PROTIME-INR - Abnormal; Notable for the following components:   Prothrombin Time 19.4 (*)    INR 1.5 (*)    All other components within normal limits  TYPE AND SCREEN    EKG: None  ED ECG REPORT   Date: 06/01/2024  Rate: 86  Rhythm: atrial fibrillation  QRS Axis: normal  Intervals: normal  ST/T Wave abnormalities: nonspecific ST/T changes  Conduction Disutrbances:none  Narrative Interpretation:   Old EKG Reviewed: unchanged  I have personally reviewed the EKG tracing and agree with the computerized printout as noted.   Radiology: DG Chest 1 View Result Date: 06/01/2024 EXAM: 1 VIEW(S) XRAY OF THE CHEST 06/01/2024 07:15:00 PM COMPARISON: 04/11/2024 CLINICAL HISTORY: chest pain FINDINGS: LUNGS AND PLEURA: Low lung volumes with bronchovascular crowding. No pleural effusion. No pneumothorax. HEART AND MEDIASTINUM: Aortic tortuosity with atherosclerosis. No cardiomegaly. BONES AND SOFT TISSUES: Surgical clips left axilla. No acute osseous abnormality. JOINTS: Bilateral shoulder osteoarthritis. DISCS/DEGENERATIVE CHANGES: Multilevel degenerative disc disease of the spine. IMPRESSION: 1. Lung volumes. No acute cardiopulmonary abnormality. Electronically signed by: Rogelia Myers MD 06/01/2024 07:25 PM EST RP Workstation: [id number]   DG Hip Unilat With Pelvis 2-3 Views Right Result Date: 06/01/2024 EXAM: 2 OR MORE VIEW(S) XRAY OF THE RIGHT HIP 06/01/2024 07:13:00 PM COMPARISON: None available. CLINICAL HISTORY: fall FINDINGS: BONES AND JOINTS: Acute impacted intertrochanteric fracture of the right femur with varus angulation. Mild bilateral hip degenerative changes. SOFT TISSUES: The soft tissues are unremarkable. IMPRESSION: 1. Acute impacted intertrochanteric fracture of the right femur. Electronically signed by: Pinkie Pebbles MD 06/01/2024 07:18 PM EST RP Workstation: [id number]     Procedures   Medications Ordered in the ED  fentaNYL   (SUBLIMAZE ) injection 50 mcg (50 mcg Intravenous Given 06/01/24 1950)  diazepam (VALIUM) tablet 2 mg (2 mg Oral Given 06/01/24 1923)    Clinical Course as of 06/01/24 2058  Mon Jun 01, 2024  2021 Case discussed with Dr. Margrette.  Patient will need admission for the fracture, they will likely operate on the patient on Wednesday. [AN]    Clinical Course User Index [AN] Charlyn Sora, MD                                 Medical Decision Making Amount and/or Complexity of Data Reviewed Labs: ordered. Radiology: ordered.  Risk Prescription drug management. Decision regarding hospitalization.   83 year old patient comes in after sustaining  what appears to be a mechanical fall. Pertinent past medical includes A-fib - on Eliquis . Collateral history provided by patient's daughter.  It appears that patient used to see Dr. Barbarann in [place].  She has previous history of knee replacement, but no injury to the hip.  Based on my history and exam, differential diagnosis includes: - Traumatic brain injury including intracranial hemorrhage - Long bone fractures -if fracture - Contusions - Soft tissue injury - Concussion  Based on the initial assessment, the following workup was initiated CT scan of the brain, x-ray of the hip and chest ordered.  I feel comfortable clearing the C-spine clinically despite patient having hip fracture in this situation.  She has no midline C-spine tenderness and denies any numbness or tingling.  She never struck her head.  I have independently interpreted the following imaging from the perspective of acute trauma: X-ray of the hip and the results indicate patient has a intertrochanteric hip fracture, femoral neck fracture. We will admit patient.  Final diagnoses:  Closed displaced intertrochanteric fracture of right femur, initial encounter [facility])    ED Discharge Orders     None          Charlyn Sora, MD 06/01/24 [phone]

## 2024-06-01 NOTE — ED Triage Notes (Signed)
 Pt arrives with [facility] c/c [facility]. Tripped over a rug at home. Denies LOC. Presents with shortening of R leg. Denies pain or injury to CTLS spine.

## 2024-06-02 ENCOUNTER — Inpatient Hospital Stay (HOSPITAL_COMMUNITY)

## 2024-06-02 ENCOUNTER — Other Ambulatory Visit (HOSPITAL_COMMUNITY): Payer: Self-pay | Admitting: *Deleted

## 2024-06-02 DIAGNOSIS — S7291XA Unspecified fracture of right femur, initial encounter for closed fracture: Secondary | ICD-10-CM | POA: Diagnosis not present

## 2024-06-02 DIAGNOSIS — I5032 Chronic diastolic (congestive) heart failure: Secondary | ICD-10-CM | POA: Diagnosis not present

## 2024-06-02 LAB — BASIC METABOLIC PANEL WITH GFR
Anion gap: 10 (ref 5–15)
BUN: 42 mg/dL — ABNORMAL HIGH (ref 8–23)
CO2: 27 mmol/L (ref 22–32)
Calcium: 8.5 mg/dL — ABNORMAL LOW (ref 8.9–10.3)
Chloride: 90 mmol/L — ABNORMAL LOW (ref 98–111)
Creatinine, Ser: 1.79 mg/dL — ABNORMAL HIGH (ref 0.44–1.00)
GFR, Estimated: 28 mL/min — ABNORMAL LOW (ref >60)
Glucose, Bld: 108 mg/dL — ABNORMAL HIGH (ref 70–99)
Potassium: 4.7 mmol/L (ref 3.5–5.1)
Sodium: 127 mmol/L — ABNORMAL LOW (ref 135–145)

## 2024-06-02 LAB — CBC WITH DIFFERENTIAL/PLATELET
Abs Immature Granulocytes: 0.04 K/uL (ref 0.00–0.07)
Basophils Absolute: 0 K/uL (ref 0.0–0.1)
Basophils Relative: 0 %
Eosinophils Absolute: 0.1 K/uL (ref 0.0–0.5)
Eosinophils Relative: 1 %
HCT: 31.3 % — ABNORMAL LOW (ref 36.0–46.0)
Hemoglobin: 10.6 g/dL — ABNORMAL LOW (ref 12.0–15.0)
Immature Granulocytes: 0 %
Lymphocytes Relative: 11 %
Lymphs Abs: 1 K/uL (ref 0.7–4.0)
MCH: 30.1 pg (ref 26.0–34.0)
MCHC: 33.9 g/dL (ref 30.0–36.0)
MCV: 88.9 fL (ref 80.0–100.0)
Monocytes Absolute: 1 K/uL (ref 0.1–1.0)
Monocytes Relative: 10 %
Neutro Abs: 7.8 K/uL — ABNORMAL HIGH (ref 1.7–7.7)
Neutrophils Relative %: 78 %
Platelets: 248 K/uL (ref 150–400)
RBC: 3.52 MIL/uL — ABNORMAL LOW (ref 3.87–5.11)
RDW: 12.9 % (ref 11.5–15.5)
WBC: 9.9 K/uL (ref 4.0–10.5)
nRBC: 0 % (ref 0.0–0.2)

## 2024-06-02 LAB — ECHOCARDIOGRAM COMPLETE
Area-P 1/2: 4.17 cm2
Height: 63 in
S' Lateral: 2.6 cm
Weight: 2160 [oz_av]

## 2024-06-02 LAB — OSMOLALITY: Osmolality: 280 mosm/kg (ref 275–295)

## 2024-06-02 LAB — MAGNESIUM: Magnesium: 2.2 mg/dL (ref 1.7–2.4)

## 2024-06-02 LAB — APTT: aPTT: 60 s — ABNORMAL HIGH (ref 24–36)

## 2024-06-02 LAB — PHOSPHORUS: Phosphorus: 4.9 mg/dL — ABNORMAL HIGH (ref 2.5–4.6)

## 2024-06-02 LAB — HEPARIN LEVEL (UNFRACTIONATED): Heparin Unfractionated: >1.1 [IU]/mL — ABNORMAL HIGH (ref 0.30–0.70)

## 2024-06-02 MED ORDER — HEPARIN BOLUS VIA INFUSION
900.0000 [IU] | Freq: Once | INTRAVENOUS | Status: AC
  Administered 2024-06-02: 900 [IU] via INTRAVENOUS
  Filled 2024-06-02: qty 900

## 2024-06-02 MED ORDER — CEFAZOLIN SODIUM-DEXTROSE 2-4 GM/100ML-% IV SOLN
2.0000 g | INTRAVENOUS | Status: DC
  Filled 2024-06-02: qty 100

## 2024-06-02 MED ORDER — CHLORHEXIDINE GLUCONATE CLOTH 2 % EX PADS
6.0000 | MEDICATED_PAD | Freq: Every day | CUTANEOUS | Status: DC
  Administered 2024-06-02 – 2024-06-09 (×7): 6 via TOPICAL

## 2024-06-02 MED ORDER — POVIDONE-IODINE 10 % EX SWAB
2.0000 | Freq: Once | CUTANEOUS | Status: AC
  Administered 2024-06-05: 2 via TOPICAL

## 2024-06-02 MED ORDER — TRANEXAMIC ACID-NACL 1000-0.7 MG/100ML-% IV SOLN: 1000.0000 mg | INTRAVENOUS | Status: AC

## 2024-06-02 MED ORDER — CHLORHEXIDINE GLUCONATE 4 % EX SOLN
60.0000 mL | Freq: Once | CUTANEOUS | Status: AC
  Administered 2024-06-05: 4 via TOPICAL

## 2024-06-02 MED ORDER — HEPARIN (PORCINE) 25000 UT/250ML-% IV SOLN
950.0000 [IU]/h | INTRAVENOUS | Status: DC
  Administered 2024-06-02: 850 [IU]/h via INTRAVENOUS
  Filled 2024-06-02: qty 250

## 2024-06-02 NOTE — TOC Initial Note (Signed)
 Transition of Care [facility]) - Initial/Assessment Note    Patient Details  Name: Pam [name] MRN: [id number] Date of Birth: [birth year]  Transition of Care [facility]) CM/SW Contact:    Noreen KATHEE Pinal, LCSWA Phone Number: 06/02/2024, 11:39 AM  Clinical Narrative:                   Patient is at risk for readmission due to high admission score. Patient was admitted for Femur fracture, right . CSW assessed patient. Patient reports that it is her and her husband in the home together and that she is fairly independent. She reports that she has all the necessary equipment at home and still able to drive. CSW informed her that ICM will be following due to need of PT eval for recommendation. CSW will continue to follow.    Expected Discharge Plan: Home/Self Care Barriers to Discharge: Continued Medical Work up   Patient Goals and CMS Choice Patient states their goals for this hospitalization and ongoing recovery are:: Return back home CMS Medicare.gov Compare Post Acute Care list provided to:: Patient Choice offered to / list presented to : Patient      Expected Discharge Plan and Services     Post Acute Care Choice: Durable Medical Equipment Living arrangements for the past 2 months: Single Family Home                                      Prior Living Arrangements/Services Living arrangements for the past 2 months: Single Family Home Lives with:: Spouse Patient language and need for interpreter reviewed:: Yes Do you feel safe going back to the place where you live?: Yes      Need for Family Participation in Patient Care: No (Comment) Care giver support system in place?: No (comment) Current home services: DME Criminal Activity/Legal Involvement Pertinent to Current Situation/Hospitalization: No - Comment as needed  Activities of Daily Living   ADL Screening (condition at time of admission) Independently performs ADLs?: Yes (appropriate for developmental age) Is the  patient deaf or have difficulty hearing?: No Does the patient have difficulty seeing, even when wearing glasses/contacts?: No Does the patient have difficulty concentrating, remembering, or making decisions?: No  Permission Sought/Granted      Share Information with NAME: Lauramae     Permission granted to share info w Relationship: Patient     Emotional Assessment Appearance:: Appears stated age Attitude/Demeanor/Rapport: Engaged Affect (typically observed): Accepting Orientation: : Oriented to Self, Oriented to Place, Oriented to  Time, Oriented to Situation Alcohol / Substance Use: Not Applicable Psych Involvement: No (comment)  Admission diagnosis:  Femur fracture, right (HCC) [S72.91XA] Closed displaced intertrochanteric fracture of right femur, initial encounter (HCC) [S72.141A] Patient Active Problem List   Diagnosis Date Noted   Femur fracture, right (HCC) 06/01/2024   Dilated cardiomyopathy (HCC) 05/07/2024   Dyslipidemia 10/22/2022   Secondary hypercoagulable state 02/26/2022   Cardiomegaly 02/26/2022   Leg swelling 03/19/2021   SOB (shortness of breath) 03/19/2021   Chronic atrial fibrillation (HCC) 08/16/2020   Abnormal CT of the abdomen 07/06/2020   Hx of total knee arthroplasty, right 04/07/2020   Diarrhea 12/30/2019   Acute abdominal pain 12/14/2019   Acute lower UTI 12/14/2019   Colitis 12/14/2019   S/P total knee arthroplasty, 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   Educated about COVID-19 virus infection 07/27/2019   Persistent atrial fibrillation (HCC) 06/07/2019   Atrial fibrillation (HCC)  Candidiasis, vagina 01/02/2019   Hernia of abdominal cavity 10/07/2015   Overflow incontinence of urine 07/04/2015   Urinary retention with incomplete bladder emptying 05/09/2015   Cervical cancer (HCC) 03/23/2015   PCP:  Orpha Yancey LABOR, MD Pharmacy:   [facility] [address], [place] - [address] [place] [address] Phone: [phone] Fax:  [phone]     Social Drivers of Health (SDOH) Social History: SDOH Screenings   Food Insecurity: No Food Insecurity (06/02/2024)  Housing: Unknown (06/02/2024)  Transportation Needs: No Transportation Needs (06/02/2024)  Utilities: Not At Risk (06/02/2024)  Social Connections: Socially Isolated (06/02/2024)  Tobacco Use: Medium Risk (06/01/2024)   SDOH Interventions:     Readmission Risk Interventions    06/02/2024   11:38 AM  Readmission Risk Prevention Plan  Transportation Screening Complete  HRI or Home Care Consult Complete  Social Work Consult for Recovery Care Planning/Counseling Complete  Palliative Care Screening Not Applicable  Medication Review Oceanographer) Complete

## 2024-06-02 NOTE — Progress Notes (Addendum)
 PROGRESS NOTE    Pam [name]  FMW:[id number] DOB: 17-Oct-1940 DOA: 06/01/2024 PCP: Orpha Yancey LABOR, Pam [name]   Brief Narrative:   83 y.o. female with medical history significant for permanent atrial fibrillation on Eliquis , hypertension, chronic HFpEF, CKD 3B, who presents to the ER after a mechanical fall at home.  States she was doing well prior to this fall. In the ER, right hip x-ray shows acute impacted intertrochanteric fracture of the right femur.  EDP discussed the case with orthopedic surgery Dr. Margrette.  He is planning to take the patient to the OR on Wednesday.  Eliquis  held and patient has been started on heparin  drip for stroke prophylaxis in the setting of atrial fibrillation. She will need PT/OT evaluation after the surgery and possible rehab placement versus home health services arrangement, depending on how she does.  Assessment & Plan:  Principal Problem:   Femur fracture, right (HCC)   Acute impacted intertrochanteric fracture of the right femur post mechanical fall, POA Right hip x-ray shows acute impacted intertrochanteric fracture of the right femur.  Pain control as needed Orthopedic surgery, Dr. Margrette, consulted. Plan for repair on 06/03/2024 Patient will be kept n.p.o. after midnight   Euvolemic hyponatremia Serum sodium 126, improved to 127 today IV fluid hydration NS at 75 cc/h x 1 day Follow serum osmolality, urine osmolality, and urine sodium Ordered repeat BMP to be checked in the morning.   AKI on CKD 3B, suspect prerenal, likely from dehydration Presented with BUN 43, creatinine 1.87 Baseline creatinine 1.49 with GFR of 35 Avoid nephrotoxic agents, dehydration, and hypotension Monitor urine output Repeat BMP in the morning.   Permanent atrial fibrillation, POA: Continue to hold home Eliquis  Started heparin  drip for CVA prevention Monitor on telemetry. Restart Eliquis  after surgery, once cleared by orthopedics.   Chronic HFpEF, POA: Not in  exacerbation Closely monitor volume status while on IV fluid Strict I's and O's and daily weight Ordered ECHO   Leukocytosis likely reactive from fracture WBC 12.1 Afebrile and no symptoms prior to the fall.   Mechanical fall Noncontrast head CT with no acute intracranial abnormality. Right hip x-ray shows acute impacted intertrochanteric fracture of the right femur. Continue fall precautions. PT/OT evaluation after the surgery  Disposition: Patient lives at home with her husband.  She may need skilled nursing facility versus home health services arrangement, depending on how she does after her right hip surgery.     DVT prophylaxis: SCDs Start: 06/01/24 2349     Code Status: Full Code Family Communication: None at the bedside Status is: Inpatient Remains inpatient appropriate because: Right hip fracture, needs surgery    Subjective:  She is said that she fell at home.  She is usually independent in activities at baseline and does not fall.  She walks without any assistance and lives with her husband.  We spoke about her diagnosis of right femoral fracture and plan for surgery to be done tomorrow.  She is currently on heparin  drip.   Examination:  General exam: Appears calm and comfortable  Respiratory system: Clear to auscultation. Respiratory effort normal. Cardiovascular system: S1 & S2 heard, RRR. No JVD, murmurs, rubs, gallops or clicks. No pedal edema. Gastrointestinal system: Abdomen is nondistended, soft and nontender. No organomegaly or masses felt. Normal bowel sounds heard. Central nervous system: Alert and oriented. No focal neurological deficits. Extremities: Movements of the right lower extremity are limited, right lower extremity is slightly shortened and externally rotated. Skin: No rashes, lesions or ulcers  Psychiatry: Judgement and insight appear normal. Mood & affect appropriate.      Diet Orders (From admission, onward)     Start     Ordered    06/03/24 0001  Diet NPO time specified  Diet effective midnight        06/02/24 0934   06/02/24 0934  Diet Heart [name] service appropriate? Yes; Fluid consistency: Thin  Diet effective now       Question Answer Comment  [name] service appropriate? Yes   Fluid consistency: Thin      06/02/24 0934            Objective: Vitals:   06/02/24 0251 06/02/24 0614 06/02/24 0928 06/02/24 0931  BP: (!) 83/57 (!) 95/57 (!) 96/48 (!) 97/59  Pulse: 61 73    Resp: 18 18    Temp: 98.2 F (36.8 C) 98.9 F (37.2 C)    TempSrc: Oral Oral    SpO2: 92% 90% 95%   Weight:      Height:        Intake/Output Summary (Last 24 hours) at 06/02/2024 1004 Last data filed at 06/02/2024 0400 Gross per 24 hour  Intake 247.5 ml  Output 1400 ml  Net -1152.5 ml   Filed Weights   06/01/24 1830  Weight: 61.2 kg    Scheduled Meds:  gabapentin   200 mg Oral QHS   rOPINIRole   1 mg Oral QHS   rosuvastatin  5 mg Oral QHS   Continuous Infusions:  sodium chloride  75 mL/hr at 06/02/24 1000   heparin  850 Units/hr (06/02/24 0943)    Nutritional status     Body mass index is 23.91 kg/m.  Data Reviewed:   CBC: Recent Labs  Lab 06/01/24 1853 06/02/24 0446  WBC 12.1* 9.9  NEUTROABS 10.0* 7.8*  HGB 11.8* 10.6*  HCT 34.9* 31.3*  MCV 89.5 88.9  PLT 304 248   Basic Metabolic Panel: Recent Labs  Lab 06/01/24 1853 06/02/24 0446  NA 126* 127*  K 5.2* 4.7  CL 89* 90*  CO2 26 27  GLUCOSE 116* 108*  BUN 43* 42*  CREATININE 1.87* 1.79*  CALCIUM 8.8* 8.5*  MG  --  2.2  PHOS  --  4.9*   GFR: Estimated Creatinine Clearance: 20.7 mL/min (A) (by C-G formula based on SCr of 1.79 mg/dL (H)). Liver Function Tests: No results for input(s): AST, ALT, ALKPHOS, BILITOT, PROT, ALBUMIN in the last 168 hours. No results for input(s): LIPASE, AMYLASE in the last 168 hours. No results for input(s): AMMONIA in the last 168 hours. Coagulation Profile: Recent Labs  Lab 06/01/24 1853  INR  1.5*   Cardiac Enzymes: No results for input(s): CKTOTAL, CKMB, CKMBINDEX, TROPONINI in the last 168 hours. BNP (last 3 results) No results for input(s): PROBNP in the last 8760 hours. HbA1C: No results for input(s): HGBA1C in the last 72 hours. CBG: No results for input(s): GLUCAP in the last 168 hours. Lipid Profile: No results for input(s): CHOL, HDL, LDLCALC, TRIG, CHOLHDL, LDLDIRECT in the last 72 hours. Thyroid  Function Tests: No results for input(s): TSH, T4TOTAL, FREET4, T3FREE, THYROIDAB in the last 72 hours. Anemia Panel: No results for input(s): VITAMINB12, FOLATE, FERRITIN, TIBC, IRON, RETICCTPCT in the last 72 hours. Sepsis Labs: No results for input(s): PROCALCITON, LATICACIDVEN in the last 168 hours.  No results found for this or any previous visit (from the past 240 hours).       Radiology Studies: CT Head Wo Contrast Result Date: 06/01/2024 EXAM: CT HEAD  WITHOUT CONTRAST 06/01/2024 09:00:33 PM TECHNIQUE: CT of the head was performed without the administration of intravenous contrast. Automated exposure control, iterative reconstruction, and/or weight based adjustment of the mA/kV was utilized to reduce the radiation dose to as low as reasonably achievable. COMPARISON: None available. CLINICAL HISTORY: Head trauma, moderate-severe. FINDINGS: BRAIN AND VENTRICLES: No acute hemorrhage. No evidence of acute infarct. No hydrocephalus. No extra-axial collection. No mass effect or midline shift. Mild periventricular white matter disease. Age-related volume loss. Mild vascular calcifications. ORBITS: No acute abnormality. Status post cataract surgery. SINUSES: No acute abnormality. SOFT TISSUES AND SKULL: No acute soft tissue abnormality. No skull fracture. IMPRESSION: 1. No acute intracranial abnormality. Electronically signed by: Pinkie Pebbles Pam [name] 06/01/2024 09:03 PM EST RP Workstation: [id number]   DG Chest 1 View Result  Date: 06/01/2024 EXAM: 1 VIEW(S) XRAY OF THE CHEST 06/01/2024 07:15:00 PM COMPARISON: 04/11/2024 CLINICAL HISTORY: chest pain FINDINGS: LUNGS AND PLEURA: Low lung volumes with bronchovascular crowding. No pleural effusion. No pneumothorax. HEART AND MEDIASTINUM: Aortic tortuosity with atherosclerosis. No cardiomegaly. BONES AND SOFT TISSUES: Surgical clips left axilla. No acute osseous abnormality. JOINTS: Bilateral shoulder osteoarthritis. DISCS/DEGENERATIVE CHANGES: Multilevel degenerative disc disease of the spine. IMPRESSION: 1. Lung volumes. No acute cardiopulmonary abnormality. Electronically signed by: Rogelia Myers Pam [name] 06/01/2024 07:25 PM EST RP Workstation: [id number]   DG Hip Unilat With Pelvis 2-3 Views Right Result Date: 06/01/2024 EXAM: 2 OR MORE VIEW(S) XRAY OF THE RIGHT HIP 06/01/2024 07:13:00 PM COMPARISON: None available. CLINICAL HISTORY: fall FINDINGS: BONES AND JOINTS: Acute impacted intertrochanteric fracture of the right femur with varus angulation. Mild bilateral hip degenerative changes. SOFT TISSUES: The soft tissues are unremarkable. IMPRESSION: 1. Acute impacted intertrochanteric fracture of the right femur. Electronically signed by: Pinkie Pebbles Pam [name] 06/01/2024 07:18 PM EST RP Workstation: [id number]           LOS: 1 day   Time spent= 35 mins    Pam Room, Pam [name] [facility]  If 7PM-7AM, please contact night-coverage  06/02/2024, 10:04 AM

## 2024-06-02 NOTE — Plan of Care (Signed)

## 2024-06-02 NOTE — Plan of Care (Signed)
  Problem: Education: Goal: Knowledge of General Education information will improve Description: Including pain rating scale, medication(s)/side effects and non-pharmacologic comfort measures Outcome: Progressing   Problem: Health Behavior/Discharge Planning: Goal: Ability to manage health-related needs will improve Outcome: Progressing   Problem: Clinical Measurements: Goal: Ability to maintain clinical measurements within normal limits will improve Outcome: Progressing Goal: Will remain free from infection Outcome: Progressing Goal: Diagnostic test results will improve Outcome: Progressing Goal: Respiratory complications will improve Outcome: Progressing Goal: Cardiovascular complication will be avoided Outcome: Progressing   Problem: Nutrition: Goal: Adequate nutrition will be maintained Outcome: Progressing   Problem: Coping: Goal: Level of anxiety will decrease Outcome: Progressing   Problem: Pain Managment: Goal: General experience of comfort will improve and/or be controlled Outcome: Progressing   Problem: Safety: Goal: Ability to remain free from injury will improve Outcome: Progressing

## 2024-06-02 NOTE — Progress Notes (Addendum)
 PHARMACY - ANTICOAGULATION CONSULT NOTE  Pharmacy Consult for heparin  Indication: atrial fibrillation  Patient Measurements: Height: 5' 3 (160 cm) Weight: 61.2 kg (135 lb) IBW/kg (Calculated) : 52.4 HEPARIN  DW (KG): 61.2  Vital Signs: Temp: 98.2 F (36.8 C) (11/11 0251) Temp Source: Oral (11/11 0251) BP: 83/57 (11/11 0251) Pulse Rate: 61 (11/11 0251)  Labs: Recent Labs    06/01/24 1853 06/02/24 0446  HGB 11.8* 10.6*  HCT 34.9* 31.3*  PLT 304 248  LABPROT 19.4*  --   INR 1.5*  --   CREATININE 1.87* 1.79*    Estimated Creatinine Clearance: 20.7 mL/min (A) (by C-G formula based on SCr of 1.79 mg/dL (H)).   Medical History: Past Medical History:  Diagnosis Date   Arthritis    Atrial fibrillation (HCC)    Cancer [facility])    BREAST CANCER / CERVICAL CANCER    Cervical cancer (HCC)    Colitis    Dysrhythmia    History of breast cancer 2006   Dr. Tona oncologist - left breast   History of gout    Hyperlipidemia    Self-catheterizes urinary bladder    Urinary retention Sept 2016   s/p hysterectomy   Assessment: 70 yoF presented s/p fall. Pharmacy consulted to dose heparin  for afib for possible R. hip repair.   -Last dose of eliquis : 11/10 @1800  (will utilize aPTTs given anti-Xa level will be falsely elevated) -Hgb 10.6, plts 248 -No bleeding on imaging; shown to have R. Femur fracture   Goal of Therapy:  Heparin  level 0.3-0.7 units/ml aPTT 66-102 seconds Monitor platelets by anticoagulation protocol: Yes   Plan:  -No bolus given recent eliquis  use -Start heparin  gtt at 850 units/hr -aPTT and anti-Xa level in 8 hours -Monitor with aPTTs until correlates anti-Xa level -CBC daily -Plans for OR 11/12  Lynwood Poplar, PharmD, BCPS Clinical Pharmacist 06/02/2024 6:03 AM

## 2024-06-02 NOTE — Plan of Care (Signed)
  Problem: Education: Goal: Knowledge of General Education information will improve Description: Including pain rating scale, medication(s)/side effects and non-pharmacologic comfort measures 06/02/2024 2256 by Lennie Rodgers BIRCH, RN Outcome: Progressing 06/02/2024 2256 by Lennie Rodgers BIRCH, RN Outcome: Progressing   Problem: Health Behavior/Discharge Planning: Goal: Ability to manage health-related needs will improve 06/02/2024 2256 by Lennie Rodgers BIRCH, RN Outcome: Progressing 06/02/2024 2256 by Lennie Rodgers BIRCH, RN Outcome: Progressing   Problem: Clinical Measurements: Goal: Ability to maintain clinical measurements within normal limits will improve 06/02/2024 2256 by Lennie Rodgers BIRCH, RN Outcome: Progressing 06/02/2024 2256 by Lennie Rodgers BIRCH, RN Outcome: Progressing Goal: Will remain free from infection 06/02/2024 2256 by Lennie Rodgers BIRCH, RN Outcome: Progressing 06/02/2024 2256 by Lennie Rodgers BIRCH, RN Outcome: Progressing Goal: Diagnostic test results will improve 06/02/2024 2256 by Lennie Rodgers BIRCH, RN Outcome: Progressing 06/02/2024 2256 by Lennie Rodgers BIRCH, RN Outcome: Progressing Goal: Respiratory complications will improve 06/02/2024 2256 by Lennie Rodgers BIRCH, RN Outcome: Progressing 06/02/2024 2256 by Lennie Rodgers BIRCH, RN Outcome: Progressing Goal: Cardiovascular complication will be avoided 06/02/2024 2256 by Lennie Rodgers BIRCH, RN Outcome: Progressing 06/02/2024 2256 by Lennie Rodgers BIRCH, RN Outcome: Progressing   Problem: Activity: Goal: Risk for activity intolerance will decrease 06/02/2024 2256 by Lennie Rodgers BIRCH, RN Outcome: Progressing 06/02/2024 2256 by Lennie Rodgers BIRCH, RN Outcome: Progressing   Problem: Nutrition: Goal: Adequate nutrition will be maintained 06/02/2024 2256 by Lennie Rodgers BIRCH, RN Outcome: Progressing 06/02/2024 2256 by Lennie Rodgers BIRCH, RN Outcome: Progressing   Problem: Coping: Goal: Level of anxiety will decrease 06/02/2024  2256 by Lennie Rodgers BIRCH, RN Outcome: Progressing 06/02/2024 2256 by Lennie Rodgers BIRCH, RN Outcome: Progressing   Problem: Elimination: Goal: Will not experience complications related to bowel motility 06/02/2024 2256 by Lennie Rodgers BIRCH, RN Outcome: Progressing 06/02/2024 2256 by Lennie Rodgers BIRCH, RN Outcome: Progressing Goal: Will not experience complications related to urinary retention 06/02/2024 2256 by Lennie Rodgers BIRCH, RN Outcome: Progressing 06/02/2024 2256 by Lennie Rodgers BIRCH, RN Outcome: Progressing   Problem: Pain Managment: Goal: General experience of comfort will improve and/or be controlled 06/02/2024 2256 by Lennie Rodgers BIRCH, RN Outcome: Progressing 06/02/2024 2256 by Lennie Rodgers BIRCH, RN Outcome: Progressing   Problem: Safety: Goal: Ability to remain free from injury will improve 06/02/2024 2256 by Lennie Rodgers BIRCH, RN Outcome: Progressing 06/02/2024 2256 by Lennie Rodgers BIRCH, RN Outcome: Progressing   Problem: Skin Integrity: Goal: Risk for impaired skin integrity will decrease 06/02/2024 2256 by Lennie Rodgers BIRCH, RN Outcome: Progressing 06/02/2024 2256 by Lennie Rodgers BIRCH, RN Outcome: Progressing

## 2024-06-02 NOTE — Progress Notes (Signed)
 PHARMACY - ANTICOAGULATION CONSULT NOTE  Pharmacy Consult for heparin  Indication: atrial fibrillation  Patient Measurements: Height: 5' 3 (160 cm) Weight: 61.2 kg (135 lb) IBW/kg (Calculated) : 52.4 HEPARIN  DW (KG): 61.2  Vital Signs: Temp: 98.5 F (36.9 C) (11/11 1553) Temp Source: Oral (11/11 1553) BP: 98/56 (11/11 1553) Pulse Rate: 81 (11/11 1553)  Labs: Recent Labs    06/01/24 1853 06/02/24 0446 06/02/24 1819  HGB 11.8* 10.6*  --   HCT 34.9* 31.3*  --   PLT 304 248  --   APTT  --   --  60*  LABPROT 19.4*  --   --   INR 1.5*  --   --   HEPARINUNFRC  --   --  >1.10*  CREATININE 1.87* 1.79*  --     Estimated Creatinine Clearance: 20.7 mL/min (A) (by C-G formula based on SCr of 1.79 mg/dL (H)).   Medical History: Past Medical History:  Diagnosis Date   Arthritis    Atrial fibrillation (HCC)    Cancer [facility])    BREAST CANCER / CERVICAL CANCER    Cervical cancer (HCC)    Colitis    Dysrhythmia    History of breast cancer 2006   Dr. Tona oncologist - left breast   History of gout    Hyperlipidemia    Self-catheterizes urinary bladder    Urinary retention Sept 2016   s/p hysterectomy   Assessment: 13 yoF presented s/p fall. Pharmacy consulted to dose heparin  for afib for possible R. hip repair.   -Last dose of eliquis : 11/10 @1800  (will utilize aPTTs given anti-Xa level will be falsely elevated) -Hgb 10.6, plts 248 -No bleeding on imaging; shown to have R. Femur fracture   Goal of Therapy:  Heparin  level 0.3-0.7 units/ml aPTT 66-102 seconds Monitor platelets by anticoagulation protocol: Yes    Date Time aPTT Rate/Comment  11/11 1819 60 Subtherapeutic @ 850 units/hr  Plan:  -Will give a 900 unit bolus and increase heparin  gtt to 950 units/hr  -Monitor aPTT in 8 hours after infusion rate change. Heparin  level daily with AM labs.  -Monitor with aPTTs until correlates anti-Xa level -CBC daily -Plans for OR 11/12  Annabella LOISE Banks,  PharmD Clinical Pharmacist 06/02/2024 7:07 PM

## 2024-06-03 ENCOUNTER — Other Ambulatory Visit: Payer: Self-pay

## 2024-06-03 ENCOUNTER — Encounter (HOSPITAL_COMMUNITY): Payer: Self-pay | Admitting: Internal Medicine

## 2024-06-03 ENCOUNTER — Inpatient Hospital Stay (HOSPITAL_COMMUNITY)

## 2024-06-03 DIAGNOSIS — D62 Acute posthemorrhagic anemia: Secondary | ICD-10-CM | POA: Diagnosis not present

## 2024-06-03 LAB — CBC
HCT: 29 % — ABNORMAL LOW (ref 36.0–46.0)
HCT: 27 % — ABNORMAL LOW (ref 36.0–46.0)
Hemoglobin: 9.6 g/dL — ABNORMAL LOW (ref 12.0–15.0)
Hemoglobin: 8.5 g/dL — ABNORMAL LOW (ref 12.0–15.0)
MCH: 30.5 pg (ref 26.0–34.0)
MCH: 30 pg (ref 26.0–34.0)
MCHC: 33.1 g/dL (ref 30.0–36.0)
MCHC: 31.5 g/dL (ref 30.0–36.0)
MCV: 92.1 fL (ref 80.0–100.0)
MCV: 95.4 fL (ref 80.0–100.0)
Platelets: 219 K/uL (ref 150–400)
Platelets: 171 K/uL (ref 150–400)
RBC: 3.15 MIL/uL — ABNORMAL LOW (ref 3.87–5.11)
RBC: 2.83 MIL/uL — ABNORMAL LOW (ref 3.87–5.11)
RDW: 13.1 % (ref 11.5–15.5)
RDW: 13 % (ref 11.5–15.5)
WBC: 10.3 K/uL (ref 4.0–10.5)
WBC: 8.4 K/uL (ref 4.0–10.5)
nRBC: 0 % (ref 0.0–0.2)
nRBC: 0 % (ref 0.0–0.2)

## 2024-06-03 LAB — BASIC METABOLIC PANEL WITH GFR
Anion gap: 9 (ref 5–15)
BUN: 26 mg/dL — ABNORMAL HIGH (ref 8–23)
CO2: 22 mmol/L (ref 22–32)
Calcium: 7.5 mg/dL — ABNORMAL LOW (ref 8.9–10.3)
Chloride: 101 mmol/L (ref 98–111)
Creatinine, Ser: 1.41 mg/dL — ABNORMAL HIGH (ref 0.44–1.00)
GFR, Estimated: 38 mL/min — ABNORMAL LOW (ref >60)
Glucose, Bld: 94 mg/dL (ref 70–99)
Potassium: 4.4 mmol/L (ref 3.5–5.1)
Sodium: 132 mmol/L — ABNORMAL LOW (ref 135–145)

## 2024-06-03 LAB — SURGICAL PCR SCREEN
MRSA, PCR: NEGATIVE
Staphylococcus aureus: NEGATIVE

## 2024-06-03 LAB — SODIUM, URINE, RANDOM: Sodium, Ur: <30 mmol/L

## 2024-06-03 LAB — APTT
aPTT: 98 s — ABNORMAL HIGH (ref 24–36)
aPTT: 81 s — ABNORMAL HIGH (ref 24–36)

## 2024-06-03 LAB — MAGNESIUM: Magnesium: 2.4 mg/dL (ref 1.7–2.4)

## 2024-06-03 LAB — OSMOLALITY, URINE: Osmolality, Ur: 340 mosm/kg (ref 300–900)

## 2024-06-03 LAB — HEPARIN LEVEL (UNFRACTIONATED): Heparin Unfractionated: >1.1 [IU]/mL — ABNORMAL HIGH (ref 0.30–0.70)

## 2024-06-03 LAB — PREPARE RBC (CROSSMATCH)

## 2024-06-03 MED ORDER — SODIUM CHLORIDE 0.9 % IV BOLUS
500.0000 mL | Freq: Once | INTRAVENOUS | Status: AC
  Administered 2024-06-03: 500 mL via INTRAVENOUS

## 2024-06-03 MED ORDER — SODIUM CHLORIDE 0.9 % IV SOLN: INTRAVENOUS | Status: DC

## 2024-06-03 MED ORDER — SODIUM CHLORIDE 0.9 % IV SOLN: 250.0000 mL | INTRAVENOUS | Status: DC

## 2024-06-03 MED ORDER — SODIUM CHLORIDE 0.9% IV SOLUTION: Freq: Once | INTRAVENOUS | Status: AC

## 2024-06-03 MED ORDER — SODIUM CHLORIDE 0.9 % IV BOLUS
250.0000 mL | Freq: Once | INTRAVENOUS | Status: AC
  Administered 2024-06-03: 250 mL via INTRAVENOUS

## 2024-06-03 MED ORDER — NOREPINEPHRINE 4 MG/250ML-% IV SOLN
0.0000 ug/min | INTRAVENOUS | Status: DC
  Administered 2024-06-03: 10 ug/min via INTRAVENOUS
  Administered 2024-06-03: 5 ug/min via INTRAVENOUS
  Administered 2024-06-04: 8 ug/min via INTRAVENOUS
  Administered 2024-06-04 – 2024-06-05 (×2): 6 ug/min via INTRAVENOUS
  Administered 2024-06-05 (×2): 9 ug/min via INTRAVENOUS
  Filled 2024-06-03 (×7): qty 250

## 2024-06-03 MED ORDER — SODIUM CHLORIDE 0.9% FLUSH: 10.0000 mL | INTRAVENOUS | Status: DC | PRN

## 2024-06-03 MED ORDER — SODIUM CHLORIDE 0.9% FLUSH
10.0000 mL | Freq: Two times a day (BID) | INTRAVENOUS | Status: DC
  Administered 2024-06-03 – 2024-06-04 (×2): 10 mL
  Administered 2024-06-04 – 2024-06-06 (×3): 20 mL
  Administered 2024-06-06 – 2024-06-07 (×2): 10 mL
  Administered 2024-06-07: 20 mL
  Administered 2024-06-08 – 2024-06-09 (×2): 10 mL

## 2024-06-03 MED ORDER — NOREPINEPHRINE 4 MG/250ML-% IV SOLN
0.0000 ug/min | INTRAVENOUS | Status: DC
Start: 2024-06-03 — End: 2024-06-03

## 2024-06-03 NOTE — Progress Notes (Signed)
 Patient ID: Pam [name], female   DOB: 1941/05/13, 83 y.o.   MRN: [id number]   Patient is preop for internal fixation of her hip fracture.  However, the patient's heparin  was not turned off in time for surgery today.  The second problem is that her blood pressure bottomed out.  Patient is now transferred to ICU and we await medical management to see if they can get the patient ready for surgery.  Surgery has been placed on hold and rescheduled for Friday morning.  Reevaluate tomorrow to see if that can be done

## 2024-06-03 NOTE — Progress Notes (Signed)
 PHARMACY - ANTICOAGULATION CONSULT NOTE  Pharmacy Consult for heparin  Indication: atrial fibrillation  Labs: Recent Labs    06/01/24 1853 06/02/24 0446 06/02/24 1819 06/03/24 0441  HGB 11.8* 10.6*  --  9.6*  HCT 34.9* 31.3*  --  29.0*  PLT 304 248  --  219  APTT  --   --  60* 98*  LABPROT 19.4*  --   --   --   INR 1.5*  --   --   --   HEPARINUNFRC  --   --  >1.10* >1.10*  CREATININE 1.87* 1.79*  --   --    Assessment/Plan:  83yo female therapeutic on heparin  after rate change. Will continue infusion at current rate of 950 units/hr and confirm stable with additional PTT.  Marvetta Dauphin, PharmD, BCPS 06/03/2024 6:17 AM

## 2024-06-03 NOTE — Plan of Care (Signed)

## 2024-06-03 NOTE — Progress Notes (Addendum)
 06/03/24 1009 06/03/24 1015 06/03/24 1110  Vitals  Temp Source Oral  --   --   BP (!) 90/52 (!) 90/46 (!) 77/46  MAP (mmHg) (!) 59 (!) 59 (!) 57  BP Location Right Arm  --   --   BP Method Automatic  --   --   Patient Position (if appropriate) Lying  --   --   Pulse Rate 82  --   --   ECG Heart Rate 89  --  89    06/03/24 1112 06/03/24 1118 06/03/24 1121  Vitals  Temp Source  --   --   --   BP (!) 78/45 (!) 77/48 (!) 87/51  MAP (mmHg) (!) 57 (!) 57 (!) 62  BP Location  --   --   --   BP Method  --   --   --   Patient Position (if appropriate)  --   --   --   Pulse Rate  --   --   --   ECG Heart Rate 79 85 87    06/03/24 1137 06/03/24 1159 06/03/24 1201  Vitals  Temp Source  --   --   --   BP (!) 85/45 (!) 79/45 (!) 84/51  MAP (mmHg) (!) 61 (!) 56 (!) 61  BP Location  --   --  Right Arm  BP Method  --   --  Automatic  Patient Position (if appropriate)  --   --  Lying  Pulse Rate  --   --  79  ECG Heart Rate 73 78  --     06/03/24 1211  Vitals  Temp Source  --   BP (!) 83/57  MAP (mmHg) 66  BP Location  --   BP Method  --   Patient Position (if appropriate)  --   Pulse Rate  --   ECG Heart Rate 83   1019: Made Dr Dino aware BP, 90/46 map 59 HR 78, asked if MD would like to re-order NS at 75 ml/hr. MD ordered this.   1029: Made Dr Dino aware: Given tylenol , and asked if MD would like to order any additional pain medication that would not effect BP. RN held oxycodone  prn d/t SBP < 100. No new orders   1020-1030: RN spoke to Pre-op nurse, and asked about surgery time and when will heparin  need to be stopped as presently no order to hold or stop heparin  noted. Pre-op nurse informed that anesthesia wants heparin  turned off now. RN turned off heparin  drip at 1030.   1111: RN Made Dr Dino aware:BP 77/46 map 57, HR 73. MD ordered NS 250 ml bolus. RN gave this.   1116: RN asked Dr Dino if would like BMP rechecked as sodium was 127 yesterday. MD ordered labs    1138: RN made Dr Tildon aware BP 85/49 map 61 HR 73 after NS 250 bolus . MD ordered an additional NS 250 ml IV. RN gave this.   1200: RN Made Dr Dino aware BP 79/45 map 56 HR 78 post second bolus 250 ml given. Patient reports no c/o dizziness, and no other complaints at this time. MD ordered for transfer to stepdown  1206: RN asked Dr Dino if would like another IV fluid bolus, patient has completed 500 ml IV NS so far. MD ordered NS 500 ml IV bolus. RN started this. RN asked MD about heparin  gtt and MD said to continue to hold and obtain labs  first.    At 1230 patient transferred to ICU bed 8, handoff report given to [facility] RN, NS 250 ml bolus x 2, given and started NS 500 ml bolus prior to transfer. Patient asymptomatic. Surgery postponed. Dr Dino aware and advised to continue to hold heparin  gtt, and obtain labs first.

## 2024-06-03 NOTE — Progress Notes (Signed)
 PHARMACY - ANTICOAGULATION CONSULT NOTE  Pharmacy Consult for heparin  Indication: atrial fibrillation  Patient Measurements: Height: 5' 3 (160 cm) Weight: 63.6 kg (140 lb 3.4 oz) IBW/kg (Calculated) : 52.4 HEPARIN  DW (KG): 61.2  Vital Signs: Temp: 98.8 F (37.1 C) (11/12 1009) Temp Source: Oral (11/12 1009) BP: 87/51 (11/12 1121) Pulse Rate: 82 (11/12 1009)  Labs: Recent Labs    06/01/24 1853 06/02/24 0446 06/02/24 1819 06/03/24 0441 06/03/24 1021  HGB 11.8* 10.6*  --  9.6*  --   HCT 34.9* 31.3*  --  29.0*  --   PLT 304 248  --  219  --   APTT  --   --  60* 98* 81*  LABPROT 19.4*  --   --   --   --   INR 1.5*  --   --   --   --   HEPARINUNFRC  --   --  >1.10* >1.10*  --   CREATININE 1.87* 1.79*  --   --   --     Estimated Creatinine Clearance: 22.5 mL/min (A) (by C-G formula based on SCr of 1.79 mg/dL (H)).   Medical History: Past Medical History:  Diagnosis Date   Arthritis    Atrial fibrillation (HCC)    Cancer [facility])    BREAST CANCER / CERVICAL CANCER    Cervical cancer (HCC)    Colitis    Dysrhythmia    History of breast cancer 2006   Dr. Tona oncologist - left breast   History of gout    Hyperlipidemia    Self-catheterizes urinary bladder    Urinary retention Sept 2016   s/p hysterectomy   Assessment: 73 yoF presented s/p fall. Pharmacy consulted to dose heparin  for afib for possible R. hip repair.   -Last dose of eliquis : 11/10 @1800  (will utilize aPTTs given anti-Xa level will be falsely elevated) -Hgb 10.6, plts 248 -No bleeding on imaging; shown to have R. Femur fracture   HL >1.10- still falsely elevated by Eliquis  APTT 81-therapeutic  Goal of Therapy:  Heparin  level 0.3-0.7 units/ml aPTT 66-102 seconds Monitor platelets by anticoagulation protocol: Yes    Plan:  Continue heparin  infusion at 950 units/hr  Heparin  level and aPTT daily -CBC daily -Plans for OR 11/12  Elspeth Sour, PharmD Clinical Pharmacist 06/03/2024 11:30  AM

## 2024-06-03 NOTE — Progress Notes (Signed)
 Peripherally Inserted Central Catheter Placement  The IV Nurse has discussed with the patient and/or persons authorized to consent for the patient, the purpose of this procedure and the potential benefits and risks involved with this procedure.  The benefits include less needle sticks, lab draws from the catheter, and the patient may be discharged home with the catheter. Risks include, but not limited to, infection, bleeding, blood clot (thrombus formation), and puncture of an artery; nerve damage and irregular heartbeat and possibility to perform a PICC exchange if needed/ordered by physician.  Alternatives to this procedure were also discussed.  Bard Power PICC patient education guide, fact sheet on infection prevention and patient information card has been provided to patient /or left at bedside.    PICC Placement Documentation  PICC Double Lumen 06/03/24 Right Basilic 36 cm 0 cm (Active)  Indication for Insertion or Continuance of Line Vasoactive infusions 06/03/24 1600  Exposed Catheter (cm) 0 cm 06/03/24 1600  Site Assessment Clean, Dry, Intact 06/03/24 1600  Lumen #1 Status Flushed;Saline locked;Blood return noted 06/03/24 1600  Lumen #2 Status Flushed;Saline locked;Blood return noted 06/03/24 1600  Dressing Type Transparent;Securing device 06/03/24 1600  Dressing Status Antimicrobial disc/dressing in place 06/03/24 1600  Line Care Connections checked and tightened 06/03/24 1600  Line Adjustment (NICU/IV Team Only) No 06/03/24 1600  Dressing Intervention New dressing;Adhesive placed at insertion site (IV team only) 06/03/24 1600  Dressing Change Due 06/10/24 06/03/24 1600       Leita  Phylisha Dix 06/03/2024, 5:05 PM

## 2024-06-03 NOTE — H&P (View-Only) (Signed)
 Patient ID: Pam [name], female   DOB: 1941/05/13, 83 y.o.   MRN: [id number]   Patient is preop for internal fixation of her hip fracture.  However, the patient's heparin  was not turned off in time for surgery today.  The second problem is that her blood pressure bottomed out.  Patient is now transferred to ICU and we await medical management to see if they can get the patient ready for surgery.  Surgery has been placed on hold and rescheduled for Friday morning.  Reevaluate tomorrow to see if that can be done

## 2024-06-03 NOTE — Progress Notes (Addendum)
 PROGRESS NOTE    [facility]  FMW:[id number] DOB: [birth year] DOA: 06/01/2024 PCP: Orpha Yancey LABOR, MD   Brief Narrative:   83 y.o. female with medical history significant for permanent atrial fibrillation on Eliquis , hypertension, chronic HFpEF, CKD 3B, who presents to the ER after a mechanical fall at home.  States she was doing well prior to this fall. In the ER, right hip x-ray shows acute impacted intertrochanteric fracture of the right femur.  EDP discussed the case with orthopedic surgery Dr. Margrette.   Eliquis  held and patient has been started on heparin  drip for stroke prophylaxis in the setting of atrial fibrillation, stopped on 11/12. Plan was to take her to OR on 11/12 but she was hypotensive and had low sodium so surgery was canceled and will be scheduled later. She will need PT/OT evaluation after the surgery and possible rehab placement versus home health services arrangement, depending on how she does.  Assessment & Plan:  Principal Problem:   Femur fracture, right (HCC)   Acute impacted intertrochanteric fracture of the right femur post mechanical fall, POA Right hip x-ray shows acute impacted intertrochanteric fracture of the right femur.  Pain control as needed Orthopedic surgery, Dr. Margrette, consulted. Initial plan was to do surgery on 11/12  but she was hypotensive and had low sodium so surgery was canceled and will be scheduled later.   Hypotension, possible hemorrhagic shock:, developed on 11/12. Received a total of 1 L NS bolus and transferred to step down for close monitoring. Ordered CT right hip to rule out hematoma/hemorrhage. Goal MAP of 65 and above Ordered PICC placement and subsequent initiation of Levophed to maintain a MAP of 65 and above.  Acute on chronic anemia: -Dropping Hb (11.8->10.6->9.6->8.5). Will hold heparin  drip for now. -f/u H&H closely -Ordered stool occult blood testing. -Abdominal examination is benign. -She does have  worsening right thigh swelling so I have ordered STAT CT right hip -ordered two units of PRBC because of persistently dropping Hb and hypotension that is unresponsive to fluid bolus.   Euvolemic hyponatremia Serum sodium 126, improved to 132 today.  Received IVF Follow serum osmolality, urine osmolality, and urine sodium Ordered repeat BMP to be checked in the morning.   AKI on CKD 3B, suspect prerenal, likely from dehydration Presented with BUN 43, creatinine 1.87 Baseline creatinine 1.49 with GFR of 35 Avoid nephrotoxic agents, dehydration, and hypotension Monitor urine output Repeat BMP in the morning.   Permanent atrial fibrillation, POA: Continue to hold home Eliquis  Started heparin  drip for CVA prevention, held on 11/12 Monitor on telemetry. Restart Eliquis  after surgery, once cleared by orthopedics.   Chronic HFpEF, POA: Not in exacerbation Closely monitor volume status while on IV fluid Strict I's and O's and daily weight Ordered ECHO-EF 60-65%. PFO present.   Leukocytosis likely reactive from fracture WBC 12.1 Afebrile and no symptoms prior to the fall.   Mechanical fall Noncontrast head CT with no acute intracranial abnormality. Right hip x-ray shows acute impacted intertrochanteric fracture of the right femur. Continue fall precautions. PT/OT evaluation after the surgery  Disposition: Patient lives at home with her husband.  She may need skilled nursing facility versus home health services arrangement, depending on how she does after her right hip surgery.   Total number of critical care minutes taking care of this patient is 39.  DVT prophylaxis: SCDs Start: 06/01/24 2349     Code Status: Full Code Family Communication: Family at the bedside Status is: Inpatient Remains inpatient  appropriate because: Right hip fracture, needs surgery    Subjective:  She is complaining of right hip pain.  She is aware that she will have surgical repair for the hip done  today.  She has been kept n.p.o. after midnight.  We spoke about the results of her echocardiogram.  Her family was also present at the bedside.   Examination:  General exam: Appears calm and comfortable  Respiratory system: Clear to auscultation. Respiratory effort normal. Cardiovascular system: S1 & S2 heard, RRR. No JVD, murmurs, rubs, gallops or clicks. No pedal edema. Gastrointestinal system: Abdomen is nondistended, soft and nontender. No organomegaly or masses felt. Normal bowel sounds heard. Central nervous system: Alert and oriented. No focal neurological deficits. Extremities: Movements of the right lower extremity are limited, right lower extremity is slightly shortened and externally rotated. Right thigh is swollen Skin: No rashes, lesions or ulcers Psychiatry: Judgement and insight appear normal. Mood & affect appropriate.      Diet Orders (From admission, onward)     Start     Ordered   06/03/24 0001  Diet NPO time specified Except for: Sips with Meds  Diet effective midnight       Question:  Except for  Answer:  Sips with Meds   06/02/24 1740            Objective: Vitals:   06/02/24 1210 06/02/24 1553 06/02/24 2041 06/03/24 0517  BP: (!) 100/54 (!) 98/56 (!) 94/56 (!) 116/48  Pulse:  81 98 95  Resp:  17  18  Temp:  98.5 F (36.9 C) 100.3 F (37.9 C) 99.7 F (37.6 C)  TempSrc:  Oral Axillary Oral  SpO2:  96% 91% 96%  Weight:    63.6 kg  Height:        Intake/Output Summary (Last 24 hours) at 06/03/2024 0949 Last data filed at 06/03/2024 0615 Gross per 24 hour  Intake --  Output 2250 ml  Net -2250 ml   Filed Weights   06/01/24 1830 06/03/24 0517  Weight: 61.2 kg 63.6 kg    Scheduled Meds:  chlorhexidine   60 mL Topical Once   Chlorhexidine  Gluconate Cloth  6 each Topical Daily   gabapentin   200 mg Oral QHS   povidone-iodine  2 Application Topical Once   rOPINIRole   1 mg Oral QHS   rosuvastatin  5 mg Oral QHS   Continuous Infusions:    ceFAZolin  (ANCEF ) IV     heparin  950 Units/hr (06/02/24 1922)   tranexamic acid       Nutritional status     Body mass index is 24.84 kg/m.  Data Reviewed:   CBC: Recent Labs  Lab 06/01/24 1853 06/02/24 0446 06/03/24 0441  WBC 12.1* 9.9 10.3  NEUTROABS 10.0* 7.8*  --   HGB 11.8* 10.6* 9.6*  HCT 34.9* 31.3* 29.0*  MCV 89.5 88.9 92.1  PLT 304 248 219   Basic Metabolic Panel: Recent Labs  Lab 06/01/24 1853 06/02/24 0446  NA 126* 127*  K 5.2* 4.7  CL 89* 90*  CO2 26 27  GLUCOSE 116* 108*  BUN 43* 42*  CREATININE 1.87* 1.79*  CALCIUM 8.8* 8.5*  MG  --  2.2  PHOS  --  4.9*   GFR: Estimated Creatinine Clearance: 22.5 mL/min (A) (by C-G formula based on SCr of 1.79 mg/dL (H)). Liver Function Tests: No results for input(s): AST, ALT, ALKPHOS, BILITOT, PROT, ALBUMIN in the last 168 hours. No results for input(s): LIPASE, AMYLASE in the last 168 hours.  No results for input(s): AMMONIA in the last 168 hours. Coagulation Profile: Recent Labs  Lab 06/01/24 1853  INR 1.5*   Cardiac Enzymes: No results for input(s): CKTOTAL, CKMB, CKMBINDEX, TROPONINI in the last 168 hours. BNP (last 3 results) No results for input(s): PROBNP in the last 8760 hours. HbA1C: No results for input(s): HGBA1C in the last 72 hours. CBG: No results for input(s): GLUCAP in the last 168 hours. Lipid Profile: No results for input(s): CHOL, HDL, LDLCALC, TRIG, CHOLHDL, LDLDIRECT in the last 72 hours. Thyroid  Function Tests: No results for input(s): TSH, T4TOTAL, FREET4, T3FREE, THYROIDAB in the last 72 hours. Anemia Panel: No results for input(s): VITAMINB12, FOLATE, FERRITIN, TIBC, IRON, RETICCTPCT in the last 72 hours. Sepsis Labs: No results for input(s): PROCALCITON, LATICACIDVEN in the last 168 hours.  Recent Results (from the past 240 hours)  Surgical pcr screen     Status: None   Collection Time: 06/03/24  5:00  AM   Specimen: Nasal Mucosa; Nasal Swab  Result Value Ref Range Status   MRSA, PCR NEGATIVE NEGATIVE Final   Staphylococcus aureus NEGATIVE NEGATIVE Final    Comment: (NOTE) The Xpert SA Assay (FDA approved for NASAL specimens in patients 41 years of age and older), is one component of a comprehensive surveillance program. It is not intended to diagnose infection nor to guide or monitor treatment. Performed at [facility], [address] [place], [place] [address]          Radiology Studies: ECHOCARDIOGRAM COMPLETE Result Date: 06/02/2024    ECHOCARDIOGRAM REPORT   Patient Name:   SHATIRA DOBOSZ Date of Exam: 06/02/2024 Medical Rec #:  [id number]       Height:       63.0 in Accession #:    [id number]      Weight:       135.0 lb Date of Birth:  02/23/1941      BSA:          1.636 m Patient Age:    82 years        BP:           100/54 mmHg Patient Gender: F               HR:           78 bpm. Exam Location:  [facility] Procedure: 2D Echo, 3D Echo, Cardiac Doppler, Color Doppler and Strain Analysis            (Both Spectral and Color Flow Doppler were utilized during            procedure). Indications:    CHF  History:        Patient has prior history of Echocardiogram examinations, most                 recent 01/21/2023. Cardiomyopathy, Arrythmias:Atrial Fibrillation,                 Signs/Symptoms:Shortness of Breath; Risk Factors:Dyslipidemia.  Sonographer:    Philomena Daring Referring Phys: [id number] QJMYJW [place]ago  Sonographer Comments: Global longitudinal strain was attempted. IMPRESSIONS  1. Left ventricular ejection fraction, by estimation, is 60 to 65%. Left ventricular ejection fraction by 3D volume is 55 %. The left ventricle has normal function. The left ventricle has no regional wall motion abnormalities. Left ventricular diastolic  function could not be evaluated. The average left ventricular global longitudinal strain is -12.4 %.  2. Right ventricular systolic function is normal. The right  ventricular size is normal. There is normal pulmonary artery systolic pressure.  3. Left atrial size was mildly dilated.  4. The mitral valve is normal in structure. Trivial mitral valve regurgitation. No evidence of mitral stenosis.  5. The aortic valve is tricuspid. Aortic valve regurgitation is trivial. No aortic stenosis is present.  6. The inferior vena cava is normal in size with greater than 50% respiratory variability, suggesting right atrial pressure of 3 mmHg.  7. Evidence of atrial level shunting detected by color flow Doppler. There is a patent foramen ovale with bidirectional shunting across atrial septum. Comparison(s): No significant change from prior study. FINDINGS  Left Ventricle: Left ventricular ejection fraction, by estimation, is 60 to 65%. Left ventricular ejection fraction by 3D volume is 55 %. The left ventricle has normal function. The left ventricle has no regional wall motion abnormalities. The average left ventricular global longitudinal strain is -12.4 %. Strain was performed and the global longitudinal strain is indeterminate. The left ventricular internal cavity size was normal in size. There is no left ventricular hypertrophy. Left ventricular diastolic function could not be evaluated due to atrial fibrillation. Left ventricular diastolic function could not be evaluated. Right Ventricle: The right ventricular size is normal. No increase in right ventricular wall thickness. Right ventricular systolic function is normal. There is normal pulmonary artery systolic pressure. The tricuspid regurgitant velocity is 2.71 m/s, and  with an assumed right atrial pressure of 3 mmHg, the estimated right ventricular systolic pressure is 32.4 mmHg. Left Atrium: Left atrial size was mildly dilated. Right Atrium: Right atrial size was normal in size. Pericardium: There is no evidence of pericardial effusion. Mitral Valve: The mitral valve is normal in structure. Trivial mitral valve regurgitation. No  evidence of mitral valve stenosis. Tricuspid Valve: The tricuspid valve is normal in structure. Tricuspid valve regurgitation is mild . No evidence of tricuspid stenosis. Aortic Valve: The aortic valve is tricuspid. Aortic valve regurgitation is trivial. No aortic stenosis is present. Pulmonic Valve: The pulmonic valve was normal in structure. Pulmonic valve regurgitation is trivial. No evidence of pulmonic stenosis. Aorta: The aortic root and ascending aorta are structurally normal, with no evidence of dilitation. Venous: The inferior vena cava is normal in size with greater than 50% respiratory variability, suggesting right atrial pressure of 3 mmHg. IAS/Shunts: Evidence of atrial level shunting detected by color flow Doppler. Additional Comments: 3D was performed not requiring image post processing on an independent workstation and was normal.  LEFT VENTRICLE PLAX 2D LVIDd:         3.50 cm         Diastology LVIDs:         2.60 cm         LV e' medial:    9.14 cm/s LV PW:         0.90 cm         LV E/e' medial:  11.8 LV IVS:        0.90 cm         LV e' lateral:   12.50 cm/s LVOT diam:     2.00 cm         LV E/e' lateral: 8.6 LV SV:         49 LV SV Index:   30              2D Longitudinal LVOT Area:     3.14 cm        Strain  2D Strain GLS   -12.4 %                                Avg:                                 3D Volume EF                                LV 3D EF:    Left                                             ventricul                                             ar                                             ejection                                             fraction                                             by 3D                                             volume is                                             55 %.                                 3D Volume EF:                                3D EF:        55 %                                LV EDV:       73 ml                                 LV ESV:       33 ml  LV SV:        40 ml RIGHT VENTRICLE             IVC RV Basal diam:  3.60 cm     IVC diam: 1.70 cm RV Mid diam:    2.90 cm RV S prime:     10.10 cm/s TAPSE (M-mode): 1.7 cm LEFT ATRIUM             Index        RIGHT ATRIUM           Index LA diam:        3.40 cm 2.08 cm/m   RA Area:     17.60 cm LA Vol (A2C):   69.9 ml 42.72 ml/m  RA Volume:   43.20 ml  26.40 ml/m LA Vol (A4C):   49.0 ml 29.95 ml/m LA Biplane Vol: 58.4 ml 35.69 ml/m  AORTIC VALVE LVOT Vmax:   96.10 cm/s LVOT Vmean:  62.400 cm/s LVOT VTI:    0.157 m  AORTA Ao Root diam: 2.80 cm Ao Asc diam:  3.00 cm MITRAL VALVE                TRICUSPID VALVE MV Area (PHT): 4.17 cm     TR Peak grad:   29.4 mmHg MV Decel Time: 182 msec     TR Vmax:        271.00 cm/s MV E velocity: 108.00 cm/s                             SHUNTS                             Systemic VTI:  0.16 m                             Systemic Diam: 2.00 cm Vishnu Priya Mallipeddi Electronically signed by Diannah Late Mallipeddi Signature Date/Time: 06/02/2024/3:08:10 PM    Final    CT Head Wo Contrast Result Date: 06/01/2024 EXAM: CT HEAD WITHOUT CONTRAST 06/01/2024 09:00:33 PM TECHNIQUE: CT of the head was performed without the administration of intravenous contrast. Automated exposure control, iterative reconstruction, and/or weight based adjustment of the mA/kV was utilized to reduce the radiation dose to as low as reasonably achievable. COMPARISON: None available. CLINICAL HISTORY: Head trauma, moderate-severe. FINDINGS: BRAIN AND VENTRICLES: No acute hemorrhage. No evidence of acute infarct. No hydrocephalus. No extra-axial collection. No mass effect or midline shift. Mild periventricular white matter disease. Age-related volume loss. Mild vascular calcifications. ORBITS: No acute abnormality. Status post cataract surgery. SINUSES: No acute abnormality. SOFT TISSUES AND SKULL: No acute soft tissue  abnormality. No skull fracture. IMPRESSION: 1. No acute intracranial abnormality. Electronically signed by: Pinkie Pebbles MD 06/01/2024 09:03 PM EST RP Workstation: [id number]   DG Chest 1 View Result Date: 06/01/2024 EXAM: 1 VIEW(S) XRAY OF THE CHEST 06/01/2024 07:15:00 PM COMPARISON: 04/11/2024 CLINICAL HISTORY: chest pain FINDINGS: LUNGS AND PLEURA: Low lung volumes with bronchovascular crowding. No pleural effusion. No pneumothorax. HEART AND MEDIASTINUM: Aortic tortuosity with atherosclerosis. No cardiomegaly. BONES AND SOFT TISSUES: Surgical clips left axilla. No acute osseous abnormality. JOINTS: Bilateral shoulder osteoarthritis. DISCS/DEGENERATIVE CHANGES: Multilevel degenerative disc disease of the spine. IMPRESSION: 1. Lung volumes. No acute cardiopulmonary abnormality. Electronically signed by: Rogelia Myers MD 06/01/2024 07:25 PM EST RP Workstation: [id number]   DG Hip  Unilat With Pelvis 2-3 Views Right Result Date: 06/01/2024 EXAM: 2 OR MORE VIEW(S) XRAY OF THE RIGHT HIP 06/01/2024 07:13:00 PM COMPARISON: None available. CLINICAL HISTORY: fall FINDINGS: BONES AND JOINTS: Acute impacted intertrochanteric fracture of the right femur with varus angulation. Mild bilateral hip degenerative changes. SOFT TISSUES: The soft tissues are unremarkable. IMPRESSION: 1. Acute impacted intertrochanteric fracture of the right femur. Electronically signed by: Pinkie Pebbles MD 06/01/2024 07:18 PM EST RP Workstation: [id number]        LOS: 2 days   Time spent= 35 mins    Deliliah Room, MD [facility]  If 7PM-7AM, please contact night-coverage  06/03/2024, 9:49 AM

## 2024-06-03 NOTE — Plan of Care (Signed)
  Problem: Education: Goal: Knowledge of General Education information will improve Description: Including pain rating scale, medication(s)/side effects and non-pharmacologic comfort measures Outcome: Progressing   Problem: Health Behavior/Discharge Planning: Goal: Ability to manage health-related needs will improve Outcome: Progressing   Problem: Clinical Measurements: Goal: Diagnostic test results will improve Outcome: Progressing Goal: Respiratory complications will improve Outcome: Progressing   Problem: Nutrition: Goal: Adequate nutrition will be maintained Outcome: Progressing   Problem: Coping: Goal: Level of anxiety will decrease Outcome: Progressing   Problem: Elimination: Goal: Will not experience complications related to bowel motility Outcome: Progressing Goal: Will not experience complications related to urinary retention Outcome: Progressing   Problem: Pain Managment: Goal: General experience of comfort will improve and/or be controlled Outcome: Progressing   Problem: Safety: Goal: Ability to remain free from injury will improve Outcome: Progressing   Problem: Skin Integrity: Goal: Risk for impaired skin integrity will decrease Outcome: Progressing

## 2024-06-04 ENCOUNTER — Ambulatory Visit (HOSPITAL_COMMUNITY)

## 2024-06-04 DIAGNOSIS — S7291XD Unspecified fracture of right femur, subsequent encounter for closed fracture with routine healing: Secondary | ICD-10-CM | POA: Diagnosis not present

## 2024-06-04 DIAGNOSIS — N179 Acute kidney failure, unspecified: Secondary | ICD-10-CM

## 2024-06-04 DIAGNOSIS — S72141A Displaced intertrochanteric fracture of right femur, initial encounter for closed fracture: Secondary | ICD-10-CM

## 2024-06-04 DIAGNOSIS — E871 Hypo-osmolality and hyponatremia: Secondary | ICD-10-CM | POA: Diagnosis not present

## 2024-06-04 LAB — URINALYSIS, ROUTINE W REFLEX MICROSCOPIC
Bacteria, UA: NONE SEEN
Bilirubin Urine: NEGATIVE
Glucose, UA: >=500 mg/dL — AB
Ketones, ur: NEGATIVE mg/dL
Leukocytes,Ua: NEGATIVE
Nitrite: NEGATIVE
Protein, ur: 30 mg/dL — AB
Specific Gravity, Urine: 1.012 (ref 1.005–1.030)
pH: 5 (ref 5.0–8.0)

## 2024-06-04 LAB — TYPE AND SCREEN
ABO/RH(D): A NEG
Antibody Screen: NEGATIVE
Unit division: 0
Unit division: 0

## 2024-06-04 LAB — BASIC METABOLIC PANEL WITH GFR
Anion gap: 11 (ref 5–15)
BUN: 17 mg/dL (ref 8–23)
CO2: 21 mmol/L — ABNORMAL LOW (ref 22–32)
Calcium: 7.1 mg/dL — ABNORMAL LOW (ref 8.9–10.3)
Chloride: 101 mmol/L (ref 98–111)
Creatinine, Ser: 1.23 mg/dL — ABNORMAL HIGH (ref 0.44–1.00)
GFR, Estimated: 44 mL/min — ABNORMAL LOW (ref >60)
Glucose, Bld: 188 mg/dL — ABNORMAL HIGH (ref 70–99)
Potassium: 3.8 mmol/L (ref 3.5–5.1)
Sodium: 134 mmol/L — ABNORMAL LOW (ref 135–145)

## 2024-06-04 LAB — BPAM RBC
Blood Product Expiration Date: 202512042359
Blood Product Expiration Date: 202512052359
ISSUE DATE / TIME: 202511121356
ISSUE DATE / TIME: 202511121824
Unit Type and Rh: 600
Unit Type and Rh: 600

## 2024-06-04 LAB — CBC
HCT: 31.8 % — ABNORMAL LOW (ref 36.0–46.0)
Hemoglobin: 10.7 g/dL — ABNORMAL LOW (ref 12.0–15.0)
MCH: 30.3 pg (ref 26.0–34.0)
MCHC: 33.6 g/dL (ref 30.0–36.0)
MCV: 90.1 fL (ref 80.0–100.0)
Platelets: 183 K/uL (ref 150–400)
RBC: 3.53 MIL/uL — ABNORMAL LOW (ref 3.87–5.11)
RDW: 13.6 % (ref 11.5–15.5)
WBC: 12.7 K/uL — ABNORMAL HIGH (ref 4.0–10.5)
nRBC: 0 % (ref 0.0–0.2)

## 2024-06-04 LAB — APTT: aPTT: 31 s (ref 24–36)

## 2024-06-04 LAB — HEPARIN LEVEL (UNFRACTIONATED): Heparin Unfractionated: >1.1 [IU]/mL — ABNORMAL HIGH (ref 0.30–0.70)

## 2024-06-04 LAB — OCCULT BLOOD X 1 CARD TO LAB, STOOL: Fecal Occult Bld: NEGATIVE

## 2024-06-04 MED ORDER — ACYCLOVIR 5 % EX OINT
TOPICAL_OINTMENT | Freq: Every day | CUTANEOUS | Status: AC
  Filled 2024-06-04: qty 15

## 2024-06-04 MED ORDER — SODIUM CHLORIDE 0.9 % IV SOLN: INTRAVENOUS | Status: AC | PRN

## 2024-06-04 MED ORDER — SODIUM CHLORIDE 0.9 % IV SOLN: INTRAVENOUS | Status: AC

## 2024-06-04 MED ORDER — ACYCLOVIR 5 % EX CREA
TOPICAL_CREAM | Freq: Every day | CUTANEOUS | Status: DC
  Filled 2024-06-04: qty 5

## 2024-06-04 NOTE — Progress Notes (Signed)
 Patient ID: Pam [name], female   DOB: [birth year], 83 y.o.   MRN: [id number]  Preoperative note  Diagnosis fracture right hip intertrochanteric displaced  Plan surgical treatment internal fixation  CBC     Latest Ref Rng & Units 06/04/2024    4:28 AM 06/03/2024   12:32 PM 06/03/2024    4:41 AM  CBC  WBC 4.0 - 10.5 K/uL 12.7  8.4  10.3   Hemoglobin 12.0 - 15.0 g/dL 89.2  8.5  9.6   Hematocrit 36.0 - 46.0 % 31.8  27.0  29.0   Platelets 150 - 400 K/uL 183  171  219      BASIC metabolic panel     Latest Ref Rng & Units 06/04/2024    4:28 AM 06/03/2024   12:32 PM 06/02/2024    4:46 AM  BMP  Glucose 70 - 99 mg/dL 811  94  891   BUN 8 - 23 mg/dL 17  26  42   Creatinine 0.44 - 1.00 mg/dL 8.76  8.58  8.20   Sodium 135 - 145 mmol/L 134  132  127   Potassium 3.5 - 5.1 mmol/L 3.8  4.4  4.7   Chloride 98 - 111 mmol/L 101  101  90   CO2 22 - 32 mmol/L 21  22  27    Calcium 8.9 - 10.3 mg/dL 7.1  7.5  8.5     BLOOD UNITS AVAILABLE type and crossed  Chest x-ray medical record  EKG medical record  Plain film findings comminuted intertrochanteric fracture right hip  OR NOTIFIED yes  Update  Blood pressure has been stable although maps have been on the lower side of normal Patient is on Levophed maintaining blood pressure with titration with targeted blood pressure  Sodium levels improved  Heart rate looks normal  Looks like we can proceed with surgery tomorrow  Heparin  has been turned off

## 2024-06-04 NOTE — Progress Notes (Signed)
 Been having to obtain pt's blood pressures on the lower leg. Pt has PICC in right arm and the left arm is restricted due to past procedure. Her clinical presentation is not matching some of the readings I am getting on the cuff at all. Very inconsistent readings. She is alert and oriented x 4. Says she feels completely fine and has no complaints at all. Some BP's read in the 50's/60's systolic during this time. Still titrating the levo up. Now on 8 mcg. Talking with MD about potentially getting an arterial line for her.

## 2024-06-04 NOTE — Hospital Course (Signed)
 83 y.o. female with medical history significant for permanent atrial fibrillation on Eliquis , hypertension, chronic HFpEF, CKD 3B, who presents to the ER after a mechanical fall at home.  States she was doing well prior to this fall. In the ER, right hip x-ray shows acute impacted intertrochanteric fracture of the right femur.  EDP discussed the case with orthopedic surgery Dr. Margrette.   Eliquis  held and patient has been started on heparin  drip for stroke prophylaxis in the setting of atrial fibrillation, stopped on 11/12.  Plan was to take her to OR on 11/12 but she was hypotensive and had low sodium so surgery was canceled and will be scheduled later.  She will need PT/OT evaluation after the surgery and possible rehab placement versus home health services arrangement, depending on how she does.

## 2024-06-04 NOTE — Consult Note (Signed)
 Reason for Consult: Fracture of the right hip Referring Physician: Dr. Afton Louder  Pam [name] is an 83 y.o. female.  HPI: 83 year old female fell and fractured her right hip.  She was scheduled for surgery but the surgery had to be canceled due to low blood pressure and hyponatremia  Patient presented with pain inability to ambulate and leg deformity  Past Medical History:  Diagnosis Date   Arthritis    Atrial fibrillation (HCC)    Cancer [facility])    BREAST CANCER / CERVICAL CANCER    Cervical cancer (HCC)    Colitis    Dysrhythmia    History of breast cancer 2006   Dr. Tona oncologist - left breast   History of gout    Hyperlipidemia    Self-catheterizes urinary bladder    Urinary retention Sept 2016   s/p hysterectomy    Past Surgical History:  Procedure Laterality Date   BIOPSY  09/09/2020   Procedure: BIOPSY;  Surgeon: Shaaron Lamar CHRISTELLA, MD;  [facility];  Service: Endoscopy;;   BREAST SURGERY     CARDIOVERSION N/A 06/03/2019   Procedure: CARDIOVERSION;  Surgeon: Jeffrie Oneil BROCKS, MD;  Location: [facility];  Service: Cardiovascular;  Laterality: N/A;   CATARACT EXTRACTION W/ INTRAOCULAR LENS  IMPLANT, BILATERAL     COLONOSCOPY WITH PROPOFOL  N/A 09/09/2020   Procedure: COLONOSCOPY WITH PROPOFOL ;  Surgeon: Shaaron Lamar CHRISTELLA, MD;  Location: [facility];  Service: Endoscopy;  Laterality: N/A;  2:45pm   EYE SURGERY     INGUINAL HERNIA REPAIR Right 12/14/2015   Procedure: LAPAROSCOPIC AND OPEN RIGHT  INGUINAL HERNIA;  Surgeon: Donnice Lunger, MD;  Location: [facility];  Service: General;  Laterality: Right;   KNEE ARTHROSCOPY Left    MASTECTOMY Left 2006   MASTECTOMY WITH AXILLARY LYMPH NODE DISSECTION Left    ROBOTIC ASSISTED TOTAL HYSTERECTOMY WITH BILATERAL SALPINGO OOPHERECTOMY Bilateral 04/07/2015   Procedure: ROBOTIC ASSISTED RADICAL HYSTERECTOMY BILATERAL SALPINGO OOPHORECTOMY BILATERAL SENTINEL LYMPHADENETOMY;  Surgeon: Maurilio Ship, MD;  Location: [facility];   Service: Gynecology;  Laterality: Bilateral;   TOTAL KNEE ARTHROPLASTY Left 07/13/2019   Procedure: LEFT TOTAL KNEE ARTHROPLASTY-CEMENTED;  Surgeon: Barbarann Oneil BROCKS, MD;  Location: [facility] OR;  Service: Orthopedics;  Laterality: Left;   TOTAL KNEE ARTHROPLASTY Right 02/15/2020   Procedure: RIGHT TOTAL KNEE ARTHROPLASTY;  Surgeon: Barbarann Oneil BROCKS, MD;  Location: [facility];  Service: Orthopedics;  Laterality: Right;   TUBAL LIGATION      Family History  Problem Relation Age of Onset   Prostate cancer Father    Heart failure Father        45s   Heart failure Mother        32   Breast cancer Sister    Colon polyps Sister        early 60s    Colon cancer Neg Hx     Social History:  reports that she quit smoking about 29 years ago. Her smoking use included cigarettes. She started smoking about 59 years ago. She has a 30 pack-year smoking history. She has never used smokeless tobacco. She reports that she does not drink alcohol and does not use drugs.  Allergies:  Allergies  Allergen Reactions   Aspirin Nausea And Vomiting   Ciprofloxacin  Diarrhea   Flagyl  [Metronidazole ] Diarrhea   Penicillins Hives    20 years  Tolerated Cephalosporin Date: 02/15/2020.      Medications: I have reviewed the patient's current medications.  Laboratory studies reviewed  Results for orders placed or performed during the hospital encounter of 06/01/24 (from the past 48 hours)  APTT     Status: Abnormal   Collection Time: 06/02/24  6:19 PM  Result Value Ref Range   aPTT 60 (H) 24 - 36 seconds    Comment:        IF BASELINE aPTT IS ELEVATED, SUGGEST PATIENT RISK ASSESSMENT BE USED TO DETERMINE APPROPRIATE ANTICOAGULANT THERAPY. Performed at [facility], [address] [place], [place] [address]   Heparin  level (unfractionated)     Status: Abnormal   Collection Time: 06/02/24  6:19 PM  Result Value Ref Range   Heparin  Unfractionated >1.10 (H) 0.30 - 0.70 IU/mL    Comment: (NOTE) The clinical reportable  range upper limit is being lowered to >1.10 to align with the FDA approved guidance for the current laboratory assay.  If heparin  results are below expected values, and patient dosage has  been confirmed, suggest follow up testing of antithrombin III levels. Performed at [facility], [address] [place], [place] [address]   CBC     Status: Abnormal   Collection Time: 06/03/24  4:41 AM  Result Value Ref Range   WBC 10.3 4.0 - 10.5 K/uL   RBC 3.15 (L) 3.87 - 5.11 MIL/uL   Hemoglobin 9.6 (L) 12.0 - 15.0 g/dL   HCT 70.9 (L) 63.9 - 53.9 %   MCV 92.1 80.0 - 100.0 fL   MCH 30.5 26.0 - 34.0 pg   MCHC 33.1 30.0 - 36.0 g/dL   RDW 86.8 88.4 - 84.4 %   Platelets 219 150 - 400 K/uL   nRBC 0.0 0.0 - 0.2 %    Comment: Performed at [facility], [address], [place], [place] [address]  APTT     Status: Abnormal   Collection Time: 06/03/24  4:41 AM  Result Value Ref Range   aPTT 98 (H) 24 - 36 seconds    Comment:        IF BASELINE aPTT IS ELEVATED, SUGGEST PATIENT RISK ASSESSMENT BE USED TO DETERMINE APPROPRIATE ANTICOAGULANT THERAPY. Performed at [facility], [address], [place], [place] [address]   Heparin  level (unfractionated)     Status: Abnormal   Collection Time: 06/03/24  4:41 AM  Result Value Ref Range   Heparin  Unfractionated >1.10 (H) 0.30 - 0.70 IU/mL    Comment: (NOTE) The clinical reportable range upper limit is being lowered to >1.10 to align with the FDA approved guidance for the current laboratory assay.  If heparin  results are below expected values, and patient dosage has  been confirmed, suggest follow up testing of antithrombin III levels. Performed at [facility], [address] [place], [place] [address]   Surgical pcr screen     Status: None   Collection Time: 06/03/24  5:00 AM   Specimen: Nasal Mucosa; Nasal Swab  Result Value Ref Range   MRSA, PCR NEGATIVE NEGATIVE   Staphylococcus aureus NEGATIVE NEGATIVE    Comment: (NOTE) The Xpert SA Assay  (FDA approved for NASAL specimens in patients 66 years of age and older), is one component of a comprehensive surveillance program. It is not intended to diagnose infection nor to guide or monitor treatment. Performed at [facility], [address], [place], [place] [address]   APTT     Status: Abnormal   Collection Time: 06/03/24 10:21 AM  Result Value Ref Range   aPTT 81 (H) 24 - 36 seconds    Comment:  IF BASELINE aPTT IS ELEVATED, SUGGEST PATIENT RISK ASSESSMENT BE USED TO DETERMINE APPROPRIATE ANTICOAGULANT THERAPY. Performed at [facility], [address] [place], [place] [address]   Osmolality, urine     Status: None   Collection Time: 06/03/24 12:16 PM  Result Value Ref Range   Osmolality, Ur 340 300 - 900 mOsm/kg    Comment: Performed at [facility], [address] [place], [place] [address]  Sodium, urine, random     Status: None   Collection Time: 06/03/24 12:16 PM  Result Value Ref Range   Sodium, Ur <30 mmol/L    Comment: Performed at [facility], [address], [place], [place] [address]  Basic metabolic panel     Status: Abnormal   Collection Time: 06/03/24 12:32 PM  Result Value Ref Range   Sodium 132 (L) 135 - 145 mmol/L   Potassium 4.4 3.5 - 5.1 mmol/L   Chloride 101 98 - 111 mmol/L   CO2 22 22 - 32 mmol/L   Glucose, Bld 94 70 - 99 mg/dL    Comment: Glucose reference range applies only to samples taken after fasting for at least 8 hours.   BUN 26 (H) 8 - 23 mg/dL   Creatinine, Ser 8.58 (H) 0.44 - 1.00 mg/dL   Calcium 7.5 (L) 8.9 - 10.3 mg/dL   GFR, Estimated 38 (L) >60 mL/min    Comment: (NOTE) Calculated using the CKD-EPI Creatinine Equation (2021)    Anion gap 9 5 - 15    Comment: Performed at [facility], [address], [place], [place] [address]  Magnesium      Status: None   Collection Time: 06/03/24 12:32 PM  Result Value Ref Range   Magnesium  2.4 1.7 - 2.4 mg/dL    Comment: Performed at [facility], [address],  [place], [place] [address]  CBC     Status: Abnormal   Collection Time: 06/03/24 12:32 PM  Result Value Ref Range   WBC 8.4 4.0 - 10.5 K/uL   RBC 2.83 (L) 3.87 - 5.11 MIL/uL   Hemoglobin 8.5 (L) 12.0 - 15.0 g/dL   HCT 72.9 (L) 63.9 - 53.9 %   MCV 95.4 80.0 - 100.0 fL   MCH 30.0 26.0 - 34.0 pg   MCHC 31.5 30.0 - 36.0 g/dL   RDW 86.9 88.4 - 84.4 %   Platelets 171 150 - 400 K/uL   nRBC 0.0 0.0 - 0.2 %    Comment: Performed at [facility], [address], [place], [place] [address]  Prepare RBC (crossmatch)     Status: None   Collection Time: 06/03/24  1:17 PM  Result Value Ref Range   Order Confirmation      ORDER PROCESSED BY BLOOD BANK Performed at [facility], [address], [place], [place] [address]   Prepare RBC (crossmatch)     Status: None   Collection Time: 06/03/24  1:34 PM  Result Value Ref Range   Order Confirmation      ORDER PROCESSED BY BLOOD BANK Performed at [facility], [address], [place], [place] [address]   Basic metabolic panel     Status: Abnormal   Collection Time: 06/04/24  4:28 AM  Result Value Ref Range   Sodium 134 (L) 135 - 145 mmol/L   Potassium 3.8 3.5 - 5.1 mmol/L   Chloride 101 98 - 111 mmol/L   CO2 21 (L) 22 - 32 mmol/L   Glucose, Bld 188 (H) 70 -  99 mg/dL    Comment: Glucose reference range applies only to samples taken after fasting for at least 8 hours.   BUN 17 8 - 23 mg/dL   Creatinine, Ser 8.76 (H) 0.44 - 1.00 mg/dL   Calcium 7.1 (L) 8.9 - 10.3 mg/dL   GFR, Estimated 44 (L) >60 mL/min    Comment: (NOTE) Calculated using the CKD-EPI Creatinine Equation (2021)    Anion gap 11 5 - 15    Comment: Performed at [facility], [address], [place], [place] [address]  APTT     Status: None   Collection Time: 06/04/24  4:28 AM  Result Value Ref Range   aPTT 31 24 - 36 seconds    Comment: Performed at [facility], [address] [place], [place] [address]  Heparin  level (unfractionated)     Status: Abnormal   Collection Time: 06/04/24   4:28 AM  Result Value Ref Range   Heparin  Unfractionated >1.10 (H) 0.30 - 0.70 IU/mL    Comment: (NOTE) The clinical reportable range upper limit is being lowered to >1.10 to align with the FDA approved guidance for the current laboratory assay.  If heparin  results are below expected values, and patient dosage has  been confirmed, suggest follow up testing of antithrombin III levels. Performed at [facility], [address] [place], [place] [address]   CBC     Status: Abnormal   Collection Time: 06/04/24  4:28 AM  Result Value Ref Range   WBC 12.7 (H) 4.0 - 10.5 K/uL   RBC 3.53 (L) 3.87 - 5.11 MIL/uL   Hemoglobin 10.7 (L) 12.0 - 15.0 g/dL    Comment: REPEATED TO VERIFY POST TRANSFUSION SPECIMEN    HCT 31.8 (L) 36.0 - 46.0 %   MCV 90.1 80.0 - 100.0 fL   MCH 30.3 26.0 - 34.0 pg   MCHC 33.6 30.0 - 36.0 g/dL   RDW 86.3 88.4 - 84.4 %   Platelets 183 150 - 400 K/uL   nRBC 0.0 0.0 - 0.2 %    Comment: Performed at [facility], [address], [place], [place] [address]    DG CHEST PORT 1 VIEW report reviewed nothing that would prevent surgery noted Result Date: 06/03/2024 CLINICAL DATA:  PICC placement. EXAM: PORTABLE CHEST 1 VIEW COMPARISON:  Chest radiograph dated 06/01/2024 FINDINGS: Right-sided PICC with tip over central SVC. No focal consolidation, pleural effusion or pneumothorax. Stable cardiomegaly. Atherosclerotic calcification of the aorta. No acute osseous pathology. Left axillary surgical clips. IMPRESSION: Right-sided PICC with tip over central SVC. Electronically Signed   By: Vanetta Chou M.D.   On: 06/03/2024 18:07   CT HIP RIGHT WO CONTRAST I have rendered a personal interpretation see below Result Date: 06/03/2024 EXAM: CT OF THE RIGHT HIP WITHOUT IV CONTRAST 06/03/2024 02:59:00 PM TECHNIQUE: CT of the right hip was performed without the administration of intravenous contrast. Multiplanar reformatted images are provided for review. Automated exposure control,  iterative reconstruction, and/or weight based adjustment of the mA/kV was utilized to reduce the radiation dose to as low as reasonably achievable. COMPARISON: Radiographs 06/01/2024. CLINICAL HISTORY: Hip trauma, fracture suspected, xray done; worsening swelling of right thigh, dropping Hb, suspect hematoma/hemorrhage. FINDINGS: BONES: Comminuted right hip fracture includes a basocervical femoral neck component as well as an intertrochanteric fracture component extending from the anterior aspect of the greater trochanter through the lesser trochanter. Varus and apex anterior angulation observed between the proximal and intermediary fragments with the degree of varus angulation and tilt  of the proximal fragment resulting in some impingement between the lateral acetabulum and the femoral neck with indistinctness of the femoral cortex at the junction of the femoral head and neck in this region, for example on image 74 series 5, favoring additional lateral subcapital fracturing. Mild spurring of the right femoral head. Spurring at the pubis also noted. No aggressive appearing osseous abnormality or periostitis. SOFT TISSUE: Edema and tracts along the iliacus muscle with some expansion of the iliacus muscle compared to the prior CT pelvis from 12/14/2019 suspicious for intermuscular hematoma although not considered large enough in volume to substantially drop the hemoglobin level. Lateral subcutaneous edema along the hip likely indicates bruising. Small amount of fluid in the right trochanteric bursa is complex and may reflect hematoma but again is not of a volume to substantially drop the hemoglobin level. Chronic atrophy of the right gluteus medius and right gluteus minimus muscles. Low level edema tracks along the right hip adductor musculature. Small right groin hernia contains adipose tissue. Clips are noted just above this groin hernia. No soft tissue mass. JOINT: The comminuted right hip fracture involves  significant disruption of the joint, including a basocervical femoral neck component and an intertrochanteric fracture component. Varus and apex anterior angulation between the proximal and intermediary fragments results in impingement between the lateral acetabulum and the femoral neck, with indistinctness of the femoral cortex at the junction of the femoral head and neck in this region, favoring additional lateral subcapital fracturing. Mild spurring of the right femoral head is present. INTRAPELVIC CONTENTS: Foley catheter in the otherwise empty urinary bladder. Iliac and right common femoral artery atheromatous vascular calcification. Trace free pelvic fluid, etiology uncertain. IMPRESSION: 1. Comminuted right hip fracture with basocervical femoral neck and intertrochanteric components, with varus and apex anterior angulation, and possible additional lateral subcapital fracture component due to cortical indistinctness at the lateral femoral headneck junction. 2. Edema tracks along the iliacus muscle which is mildly expanded, suspicious for intermuscular hematoma, but not considered large enough to substantially drop hemoglobin level. 3. Small amount of complex fluid in the right trochanteric bursa, possibly hematoma, not of a volume to substantially drop hemoglobin level. 4. Degenerative changes with mild spurring of the right femoral head. 5. Degenerative changes with spurring at the pubis. Electronically signed by: Ryan Salvage MD 06/03/2024 04:13 PM EST RP Workstation: [id number]   US  EKG SITE RITE Result Date: 06/03/2024 If Site Rite image not attached, placement could not be confirmed due to current cardiac rhythm.   Review of Systems joint pain no chest pain or shortness of breath no syncope other systems were normal  Other systems normal Blood pressure (!) 103/53, pulse 87, temperature 98.7 F (37.1 C), temperature source Oral, resp. rate 14, height 5' 3 (1.6 m), weight 64.8 kg, SpO2  100%. Physical Exam General Appearance the patient's frame is small no gross deformities from a congenital standpoint  When I saw the patient she was oriented to person place and time mood is pleasant affect was normal  Gait patient unable to walk  Upper extremities and the left lower extremity on inspection look normal other than some chronic degenerative changes in the small joints of the hand there were no major contractures noted no incidence of subluxation dislocation muscle tone was normal skin was intact though thin  Overall cardiovascular system showed normal pulse temperature with no edema there is no lymphadenopathy in the elbows shoulders or groin sensation was intact pathologic reflexes deferred in the lower extremities deep tendon  reflexes in upper extremities normal  Balance could not be tested coordination in the upper extremities normal  Right lower extremity externally rotated tenderness proximally over the fracture site range of motion deferred stability test deferred muscle tone normal skin intact  Imaging reviewed include the [facility] pelvis and right hip x-ray as well as CT scan  This is an atypical intertrochanteric fracture best described as a peritrochanteric fracture with elements of possible subcapital and basicervical fracture lines   Assessment/Plan:  History of cardiac arrhythmia Right hip fracture: Intertrochanteric fracture comminuted atypical Hypovolemia Hypotension  The procedure has been fully reviewed with the patient; The risks and benefits of surgery have been discussed and explained and understood. Alternative treatment has also been reviewed, questions were encouraged and answered. The postoperative plan is also been reviewed.  Recommend open treatment internal fixation right hip with cephalic medullary nailing plus or minus derotation screw  [name] 06/04/2024, 11:16 AM

## 2024-06-04 NOTE — Progress Notes (Signed)
 PROGRESS NOTE   Pam [name]  FMW:[id number] DOB: February 23, 1941 DOA: 06/01/2024 PCP: Orpha Yancey LABOR, MD   Chief Complaint  Patient presents with   Fall   Level of care: ICU  Brief Admission History:  83 y.o. female with medical history significant for permanent atrial fibrillation on Eliquis , hypertension, chronic HFpEF, CKD 3B, who presents to the ER after a mechanical fall at home.  States she was doing well prior to this fall. In the ER, right hip x-ray shows acute impacted intertrochanteric fracture of the right femur.  EDP discussed the case with orthopedic surgery Dr. Margrette.   Eliquis  held and patient has been started on heparin  drip for stroke prophylaxis in the setting of atrial fibrillation, stopped on 11/12.  Plan was to take her to OR on 11/12 but she was hypotensive and had low sodium so surgery was canceled and will be scheduled later.  She will need PT/OT evaluation after the surgery and possible rehab placement versus home health services arrangement, depending on how she does.   Assessment and Plan:  Hemorrhagic Shock -- Patient required initiation of IV pressors, norepinephrine was started -- Plan is to wean IV norepinephrine as able over the next 24 hours -- Patient was transfused 3 units PRBC on 06/03/2024   Acute impacted intertrochanteric fracture of the right femur post mechanical fall -- Right hip x-ray shows acute impacted intertrochanteric fracture of the right femur -- Pain control as needed -- Orthopedic surgery, Dr. Margrette, consulted and plan to OR when medically stabilized -- continue supportive measures for now  ABLA Chronic normocytic anemia -- s/p 3 units PRBC transfusion on 06/03/2024 -- Fortunately hemoglobin has rebounded to 10.7 today -- continue to follow Hg -- agree with holding IV heparin  for now  Hyponatremia -- Sodium improved to 134 after IV fluids and PRBC -- Follow   AKI on CKD 3b -- creatinine improved after IV fluids and  PRBC transfusion  Permanent Afib  -- holding home apixaban  -- holding IV heparin  for now -- plan to restart apixaban  after surgery when ok with ortho   Chronic HFpEF -- appears compensated at this time -- monitoring weights, I/Os [facility] Weights   06/01/24 1830 06/03/24 0517 06/04/24 0338  Weight: 61.2 kg 63.6 kg 64.8 kg    Intake/Output Summary (Last 24 hours) at 06/04/2024 1007 Last data filed at 06/04/2024 0933 Gross per 24 hour  Intake 3123.36 ml  Output 2550 ml  Net 573.36 ml   Leukocytosis -- reactive from recent fracture -- no s/s of infection found -- follow   Mechanical Fall  -- Noncontrast head CT with no acute intracranial abnormality. -- Right hip x-ray shows acute impacted intertrochanteric fracture of the right femur. -- Continue fall precautions. -- PT/OT evaluation after the surgery   DVT prophylaxis: SCDs Code Status: Full  Family Communication:  Disposition: TBD    Consultants:  ortho Procedures:   Antimicrobials:    Subjective: Pt reports she is still having a lot of leg and hip pain/discomfort, she was able to eat a small amount of breakfast.   Objective: Vitals:   06/04/24 0730 06/04/24 0745 06/04/24 0800 06/04/24 0900  BP: (!) 134/41 (!) 125/45 (!) 112/97 127/64  Pulse: 88 92  86  Resp: (!) 25 19 16  (!) 21  Temp: 98.7 F (37.1 C)     TempSrc: Oral     SpO2: 100% 100% 100% 100%  Weight:      Height:  Intake/Output Summary (Last 24 hours) at 06/04/2024 1001 Last data filed at 06/04/2024 0933 Gross per 24 hour  Intake 3123.36 ml  Output 2550 ml  Net 573.36 ml   Filed Weights   06/01/24 1830 06/03/24 0517 06/04/24 0338  Weight: 61.2 kg 63.6 kg 64.8 kg   Examination:  General exam: Appears calm and comfortable  Respiratory system: Clear to auscultation. Respiratory effort normal. Cardiovascular system: normal S1 & S2 heard. No JVD, murmurs, rubs, gallops or clicks. No pedal edema. Gastrointestinal system: Abdomen is  nondistended, soft and nontender. No organomegaly or masses felt. Normal bowel sounds heard. Central nervous system: Alert and oriented. No focal neurological deficits. Extremities: good palpable pedal pulses.  Skin: No rashes, lesions or ulcers. Psychiatry: Judgement and insight appear normal. Mood & affect appropriate.   Data Reviewed: I have personally reviewed following labs and imaging studies  CBC: Recent Labs  Lab 06/01/24 1853 06/02/24 0446 06/03/24 0441 06/03/24 1232 06/04/24 0428  WBC 12.1* 9.9 10.3 8.4 12.7*  NEUTROABS 10.0* 7.8*  --   --   --   HGB 11.8* 10.6* 9.6* 8.5* 10.7*  HCT 34.9* 31.3* 29.0* 27.0* 31.8*  MCV 89.5 88.9 92.1 95.4 90.1  PLT 304 248 219 171 183    Basic Metabolic Panel: Recent Labs  Lab 06/01/24 1853 06/02/24 0446 06/03/24 1232 06/04/24 0428  NA 126* 127* 132* 134*  K 5.2* 4.7 4.4 3.8  CL 89* 90* 101 101  CO2 26 27 22  21*  GLUCOSE 116* 108* 94 188*  BUN 43* 42* 26* 17  CREATININE 1.87* 1.79* 1.41* 1.23*  CALCIUM 8.8* 8.5* 7.5* 7.1*  MG  --  2.2 2.4  --   PHOS  --  4.9*  --   --     CBG: No results for input(s): GLUCAP in the last 168 hours.  Recent Results (from the past 240 hours)  Surgical pcr screen     Status: None   Collection Time: 06/03/24  5:00 AM   Specimen: Nasal Mucosa; Nasal Swab  Result Value Ref Range Status   MRSA, PCR NEGATIVE NEGATIVE Final   Staphylococcus aureus NEGATIVE NEGATIVE Final    Comment: (NOTE) The Xpert SA Assay (FDA approved for NASAL specimens in patients 66 years of age and older), is one component of a comprehensive surveillance program. It is not intended to diagnose infection nor to guide or monitor treatment. Performed at [facility], [address], [place], [place] [address]      Radiology Studies: DG CHEST PORT 1 VIEW Result Date: 06/03/2024 CLINICAL DATA:  PICC placement. EXAM: PORTABLE CHEST 1 VIEW COMPARISON:  Chest radiograph dated 06/01/2024 FINDINGS: Right-sided PICC with  tip over central SVC. No focal consolidation, pleural effusion or pneumothorax. Stable cardiomegaly. Atherosclerotic calcification of the aorta. No acute osseous pathology. Left axillary surgical clips. IMPRESSION: Right-sided PICC with tip over central SVC. Electronically Signed   By: Vanetta Chou M.D.   On: 06/03/2024 18:07   CT HIP RIGHT WO CONTRAST Result Date: 06/03/2024 EXAM: CT OF THE RIGHT HIP WITHOUT IV CONTRAST 06/03/2024 02:59:00 PM TECHNIQUE: CT of the right hip was performed without the administration of intravenous contrast. Multiplanar reformatted images are provided for review. Automated exposure control, iterative reconstruction, and/or weight based adjustment of the mA/kV was utilized to reduce the radiation dose to as low as reasonably achievable. COMPARISON: Radiographs 06/01/2024. CLINICAL HISTORY: Hip trauma, fracture suspected, xray done; worsening swelling of right thigh, dropping Hb, suspect hematoma/hemorrhage. FINDINGS: BONES: Comminuted right hip fracture  includes a basocervical femoral neck component as well as an intertrochanteric fracture component extending from the anterior aspect of the greater trochanter through the lesser trochanter. Varus and apex anterior angulation observed between the proximal and intermediary fragments with the degree of varus angulation and tilt of the proximal fragment resulting in some impingement between the lateral acetabulum and the femoral neck with indistinctness of the femoral cortex at the junction of the femoral head and neck in this region, for example on image 74 series 5, favoring additional lateral subcapital fracturing. Mild spurring of the right femoral head. Spurring at the pubis also noted. No aggressive appearing osseous abnormality or periostitis. SOFT TISSUE: Edema and tracts along the iliacus muscle with some expansion of the iliacus muscle compared to the prior CT pelvis from 12/14/2019 suspicious for intermuscular hematoma  although not considered large enough in volume to substantially drop the hemoglobin level. Lateral subcutaneous edema along the hip likely indicates bruising. Small amount of fluid in the right trochanteric bursa is complex and may reflect hematoma but again is not of a volume to substantially drop the hemoglobin level. Chronic atrophy of the right gluteus medius and right gluteus minimus muscles. Low level edema tracks along the right hip adductor musculature. Small right groin hernia contains adipose tissue. Clips are noted just above this groin hernia. No soft tissue mass. JOINT: The comminuted right hip fracture involves significant disruption of the joint, including a basocervical femoral neck component and an intertrochanteric fracture component. Varus and apex anterior angulation between the proximal and intermediary fragments results in impingement between the lateral acetabulum and the femoral neck, with indistinctness of the femoral cortex at the junction of the femoral head and neck in this region, favoring additional lateral subcapital fracturing. Mild spurring of the right femoral head is present. INTRAPELVIC CONTENTS: Foley catheter in the otherwise empty urinary bladder. Iliac and right common femoral artery atheromatous vascular calcification. Trace free pelvic fluid, etiology uncertain. IMPRESSION: 1. Comminuted right hip fracture with basocervical femoral neck and intertrochanteric components, with varus and apex anterior angulation, and possible additional lateral subcapital fracture component due to cortical indistinctness at the lateral femoral headneck junction. 2. Edema tracks along the iliacus muscle which is mildly expanded, suspicious for intermuscular hematoma, but not considered large enough to substantially drop hemoglobin level. 3. Small amount of complex fluid in the right trochanteric bursa, possibly hematoma, not of a volume to substantially drop hemoglobin level. 4. Degenerative  changes with mild spurring of the right femoral head. 5. Degenerative changes with spurring at the pubis. Electronically signed by: Ryan Salvage MD 06/03/2024 04:13 PM EST RP Workstation: [id number]   US  EKG SITE RITE Result Date: 06/03/2024 If Site Rite image not attached, placement could not be confirmed due to current cardiac rhythm.  ECHOCARDIOGRAM COMPLETE Result Date: 06/02/2024    ECHOCARDIOGRAM REPORT   Patient Name:   IMAAN PADGETT Date of Exam: 06/02/2024 Medical Rec #:  [id number]       Height:       63.0 in Accession #:    [id number]      Weight:       135.0 lb Date of Birth:  24-Nov-1940      BSA:          1.636 m Patient Age:    82 years        BP:           100/54 mmHg Patient Gender: F  HR:           78 bpm. Exam Location:  [facility] Procedure: 2D Echo, 3D Echo, Cardiac Doppler, Color Doppler and Strain Analysis            (Both Spectral and Color Flow Doppler were utilized during            procedure). Indications:    CHF  History:        Patient has prior history of Echocardiogram examinations, most                 recent 01/21/2023. Cardiomyopathy, Arrythmias:Atrial Fibrillation,                 Signs/Symptoms:Shortness of Breath; Risk Factors:Dyslipidemia.  Sonographer:    Philomena Daring Referring Phys: [id number] QJMYJW RASHID  Sonographer Comments: Global longitudinal strain was attempted. IMPRESSIONS  1. Left ventricular ejection fraction, by estimation, is 60 to 65%. Left ventricular ejection fraction by 3D volume is 55 %. The left ventricle has normal function. The left ventricle has no regional wall motion abnormalities. Left ventricular diastolic  function could not be evaluated. The average left ventricular global longitudinal strain is -12.4 %.  2. Right ventricular systolic function is normal. The right ventricular size is normal. There is normal pulmonary artery systolic pressure.  3. Left atrial size was mildly dilated.  4. The mitral valve is normal in structure.  Trivial mitral valve regurgitation. No evidence of mitral stenosis.  5. The aortic valve is tricuspid. Aortic valve regurgitation is trivial. No aortic stenosis is present.  6. The inferior vena cava is normal in size with greater than 50% respiratory variability, suggesting right atrial pressure of 3 mmHg.  7. Evidence of atrial level shunting detected by color flow Doppler. There is a patent foramen ovale with bidirectional shunting across atrial septum. Comparison(s): No significant change from prior study. FINDINGS  Left Ventricle: Left ventricular ejection fraction, by estimation, is 60 to 65%. Left ventricular ejection fraction by 3D volume is 55 %. The left ventricle has normal function. The left ventricle has no regional wall motion abnormalities. The average left ventricular global longitudinal strain is -12.4 %. Strain was performed and the global longitudinal strain is indeterminate. The left ventricular internal cavity size was normal in size. There is no left ventricular hypertrophy. Left ventricular diastolic function could not be evaluated due to atrial fibrillation. Left ventricular diastolic function could not be evaluated. Right Ventricle: The right ventricular size is normal. No increase in right ventricular wall thickness. Right ventricular systolic function is normal. There is normal pulmonary artery systolic pressure. The tricuspid regurgitant velocity is 2.71 m/s, and  with an assumed right atrial pressure of 3 mmHg, the estimated right ventricular systolic pressure is 32.4 mmHg. Left Atrium: Left atrial size was mildly dilated. Right Atrium: Right atrial size was normal in size. Pericardium: There is no evidence of pericardial effusion. Mitral Valve: The mitral valve is normal in structure. Trivial mitral valve regurgitation. No evidence of mitral valve stenosis. Tricuspid Valve: The tricuspid valve is normal in structure. Tricuspid valve regurgitation is mild . No evidence of tricuspid  stenosis. Aortic Valve: The aortic valve is tricuspid. Aortic valve regurgitation is trivial. No aortic stenosis is present. Pulmonic Valve: The pulmonic valve was normal in structure. Pulmonic valve regurgitation is trivial. No evidence of pulmonic stenosis. Aorta: The aortic root and ascending aorta are structurally normal, with no evidence of dilitation. Venous: The inferior vena cava is normal in size with greater than  50% respiratory variability, suggesting right atrial pressure of 3 mmHg. IAS/Shunts: Evidence of atrial level shunting detected by color flow Doppler. Additional Comments: 3D was performed not requiring image post processing on an independent workstation and was normal.  LEFT VENTRICLE PLAX 2D LVIDd:         3.50 cm         Diastology LVIDs:         2.60 cm         LV e' medial:    9.14 cm/s LV PW:         0.90 cm         LV E/e' medial:  11.8 LV IVS:        0.90 cm         LV e' lateral:   12.50 cm/s LVOT diam:     2.00 cm         LV E/e' lateral: 8.6 LV SV:         49 LV SV Index:   30              2D Longitudinal LVOT Area:     3.14 cm        Strain                                2D Strain GLS   -12.4 %                                Avg:                                 3D Volume EF                                LV 3D EF:    Left                                             ventricul                                             ar                                             ejection                                             fraction                                             by 3D  volume is                                             55 %.                                 3D Volume EF:                                3D EF:        55 %                                LV EDV:       73 ml                                LV ESV:       33 ml                                LV SV:        40 ml RIGHT VENTRICLE             IVC RV Basal diam:  3.60 cm     IVC  diam: 1.70 cm RV Mid diam:    2.90 cm RV S prime:     10.10 cm/s TAPSE (M-mode): 1.7 cm LEFT ATRIUM             Index        RIGHT ATRIUM           Index LA diam:        3.40 cm 2.08 cm/m   RA Area:     17.60 cm LA Vol (A2C):   69.9 ml 42.72 ml/m  RA Volume:   43.20 ml  26.40 ml/m LA Vol (A4C):   49.0 ml 29.95 ml/m LA Biplane Vol: 58.4 ml 35.69 ml/m  AORTIC VALVE LVOT Vmax:   96.10 cm/s LVOT Vmean:  62.400 cm/s LVOT VTI:    0.157 m  AORTA Ao Root diam: 2.80 cm Ao Asc diam:  3.00 cm MITRAL VALVE                TRICUSPID VALVE MV Area (PHT): 4.17 cm     TR Peak grad:   29.4 mmHg MV Decel Time: 182 msec     TR Vmax:        271.00 cm/s MV E velocity: 108.00 cm/s                             SHUNTS                             Systemic VTI:  0.16 m                             Systemic Diam: 2.00 cm Vishnu Priya Mallipeddi Electronically signed by Diannah Late Mallipeddi Signature Date/Time: 06/02/2024/3:08:10 PM    Final  Scheduled Meds:  chlorhexidine   60 mL Topical Once   Chlorhexidine  Gluconate Cloth  6 each Topical Daily   gabapentin   200 mg Oral QHS   povidone-iodine  2 Application Topical Once   rOPINIRole   1 mg Oral QHS   rosuvastatin  5 mg Oral QHS   sodium chloride  flush  10-40 mL Intracatheter Q12H   Continuous Infusions:  sodium chloride  75 mL/hr at 06/03/24 1250   sodium chloride      norepinephrine (LEVOPHED) Adult infusion 4 mcg/min (06/04/24 0933)     LOS: 3 days   Critical Care Procedure Note Authorized and Performed by: KYM Louder MD  Total Critical Care time:  58 mins Due to a high probability of clinically significant, life threatening deterioration, the patient required my highest level of preparedness to intervene emergently and I personally spent this critical care time directly and personally managing the patient.  This critical care time included obtaining a history; examining the patient, pulse oximetry; ordering and review of studies; arranging urgent treatment with  development of a management plan; evaluation of patient's response of treatment; frequent reassessment; and discussions with other providers.  This critical care time was performed to assess and manage the high probability of imminent and life threatening deterioration that could result in multi-organ failure.  It was exclusive of separately billable procedures and treating other patients and teaching time.    Afton Louder, MD How to contact the [facility] Attending or Consulting provider 7A - 7P or covering provider during after hours 7P -7A, for this patient?  Check the care team in [facility] and look for a) attending/consulting [facility] provider listed and b) the [facility] team listed Log into [email or web address] to find provider on call.  Locate the [facility] provider you are looking for under [facility] and page to a number that you can be directly reached. If you still have difficulty reaching the provider, please page the [facility] (Director on Call) for the Hospitalists listed on amion for assistance.  06/04/2024, 10:01 AM

## 2024-06-04 NOTE — Progress Notes (Signed)
 Patient has been having erratic Blood pressure reading at times with MAP of 20,very inconsistent,  large pulse pressure gap, patient is awake and alert,  no complains of dizziness/Headache and is not lethargic,patient is all asymptomatic despite of inconsistent Blood pressure reading, got an order for Arterial line, since patient has Bilateral upper extremities restriction, RT won't be doing the Arterial line, so this RN consulted the house doctor and Dr. Mavis for surgery, Dr. Mavis mentioned he does not do Art line not even in the femoral site and mentioned Anaesthesiologist has to do it, Dr. 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  NP Erminio at bedside with patient, assessed and does not recommend Art line, given patient situation and mental status, asked to monitor her blood pressure through, and won't need intervention except for the Levo for now unless symptomatic. Will continue to monitor and endorse.

## 2024-06-05 ENCOUNTER — Inpatient Hospital Stay (HOSPITAL_COMMUNITY)

## 2024-06-05 ENCOUNTER — Encounter (HOSPITAL_COMMUNITY)
Admission: EM | Disposition: A | Discharge status: Skilled Nursing Facility | Payer: Self-pay | Source: Home / Self Care | Attending: Family Medicine

## 2024-06-05 ENCOUNTER — Telehealth: Payer: Self-pay | Admitting: Orthopedic Surgery

## 2024-06-05 ENCOUNTER — Inpatient Hospital Stay (HOSPITAL_COMMUNITY): Admitting: Anesthesiology

## 2024-06-05 ENCOUNTER — Encounter (HOSPITAL_COMMUNITY): Admitting: Anesthesiology

## 2024-06-05 ENCOUNTER — Encounter (HOSPITAL_COMMUNITY): Payer: Self-pay | Admitting: Internal Medicine

## 2024-06-05 DIAGNOSIS — Z87891 Personal history of nicotine dependence: Secondary | ICD-10-CM

## 2024-06-05 DIAGNOSIS — I10 Essential (primary) hypertension: Secondary | ICD-10-CM | POA: Diagnosis not present

## 2024-06-05 DIAGNOSIS — S7291XD Unspecified fracture of right femur, subsequent encounter for closed fracture with routine healing: Secondary | ICD-10-CM | POA: Diagnosis not present

## 2024-06-05 DIAGNOSIS — R578 Other shock: Secondary | ICD-10-CM | POA: Diagnosis not present

## 2024-06-05 DIAGNOSIS — E860 Dehydration: Secondary | ICD-10-CM

## 2024-06-05 DIAGNOSIS — N179 Acute kidney failure, unspecified: Secondary | ICD-10-CM | POA: Diagnosis not present

## 2024-06-05 DIAGNOSIS — S72001A Fracture of unspecified part of neck of right femur, initial encounter for closed fracture: Secondary | ICD-10-CM | POA: Diagnosis not present

## 2024-06-05 DIAGNOSIS — S72141A Displaced intertrochanteric fracture of right femur, initial encounter for closed fracture: Secondary | ICD-10-CM | POA: Diagnosis not present

## 2024-06-05 HISTORY — PX: INTRAMEDULLARY (IM) NAIL INTERTROCHANTERIC: SHX5875

## 2024-06-05 LAB — COMPREHENSIVE METABOLIC PANEL WITH GFR
ALT: 10 U/L (ref 0–44)
AST: 15 U/L (ref 15–41)
Albumin: 3.4 g/dL — ABNORMAL LOW (ref 3.5–5.0)
Alkaline Phosphatase: 64 U/L (ref 38–126)
Anion gap: 9 (ref 5–15)
BUN: 15 mg/dL (ref 8–23)
CO2: 25 mmol/L (ref 22–32)
Calcium: 8 mg/dL — ABNORMAL LOW (ref 8.9–10.3)
Chloride: 99 mmol/L (ref 98–111)
Creatinine, Ser: 0.96 mg/dL (ref 0.44–1.00)
GFR, Estimated: 60 mL/min — ABNORMAL LOW (ref >60)
Glucose, Bld: 139 mg/dL — ABNORMAL HIGH (ref 70–99)
Potassium: 4.3 mmol/L (ref 3.5–5.1)
Sodium: 133 mmol/L — ABNORMAL LOW (ref 135–145)
Total Bilirubin: 0.7 mg/dL (ref 0.0–1.2)
Total Protein: 5.4 g/dL — ABNORMAL LOW (ref 6.5–8.1)

## 2024-06-05 LAB — CBC
HCT: 34.6 % — ABNORMAL LOW (ref 36.0–46.0)
Hemoglobin: 11.6 g/dL — ABNORMAL LOW (ref 12.0–15.0)
MCH: 30.2 pg (ref 26.0–34.0)
MCHC: 33.5 g/dL (ref 30.0–36.0)
MCV: 90.1 fL (ref 80.0–100.0)
Platelets: 220 K/uL (ref 150–400)
RBC: 3.84 MIL/uL — ABNORMAL LOW (ref 3.87–5.11)
RDW: 13.5 % (ref 11.5–15.5)
WBC: 12.2 K/uL — ABNORMAL HIGH (ref 4.0–10.5)
nRBC: 0 % (ref 0.0–0.2)

## 2024-06-05 LAB — TYPE AND SCREEN
ABO/RH(D): A NEG
Antibody Screen: NEGATIVE

## 2024-06-05 LAB — PHOSPHORUS: Phosphorus: 1.7 mg/dL — ABNORMAL LOW (ref 2.5–4.6)

## 2024-06-05 SURGERY — FIXATION, FRACTURE, INTERTROCHANTERIC, WITH INTRAMEDULLARY ROD
Anesthesia: General | Site: Hip | Laterality: Right

## 2024-06-05 MED ORDER — SODIUM CHLORIDE 0.9 % IR SOLN
Status: DC | PRN
  Administered 2024-06-05: 1000 mL

## 2024-06-05 MED ORDER — PHENYLEPHRINE 80 MCG/ML (10ML) SYRINGE FOR IV PUSH (FOR BLOOD PRESSURE SUPPORT)
PREFILLED_SYRINGE | INTRAVENOUS | Status: AC
  Filled 2024-06-05: qty 20

## 2024-06-05 MED ORDER — PHENYLEPHRINE 80 MCG/ML (10ML) SYRINGE FOR IV PUSH (FOR BLOOD PRESSURE SUPPORT)
PREFILLED_SYRINGE | INTRAVENOUS | Status: DC | PRN
Start: 2024-06-05 — End: 2024-06-05
  Administered 2024-06-05: 80 ug via INTRAVENOUS
  Administered 2024-06-05 (×3): 160 ug via INTRAVENOUS
  Administered 2024-06-05: 80 ug via INTRAVENOUS
  Administered 2024-06-05: 160 ug via INTRAVENOUS
  Administered 2024-06-05: 80 ug via INTRAVENOUS
  Administered 2024-06-05: 160 ug via INTRAVENOUS
  Administered 2024-06-05: 80 ug via INTRAVENOUS

## 2024-06-05 MED ORDER — OXYCODONE HCL 5 MG PO TABS: 5.0000 mg | ORAL_TABLET | Freq: Once | ORAL | Status: DC | PRN

## 2024-06-05 MED ORDER — PHENYLEPHRINE HCL-NACL 20-0.9 MG/250ML-% IV SOLN
INTRAVENOUS | Status: AC
  Filled 2024-06-05: qty 250

## 2024-06-05 MED ORDER — PROPOFOL 10 MG/ML IV BOLUS
INTRAVENOUS | Status: DC | PRN
  Administered 2024-06-05: 20 mg via INTRAVENOUS
  Administered 2024-06-05: 10 ug/kg/min via INTRAVENOUS
  Administered 2024-06-05: 75 mg via INTRAVENOUS
  Administered 2024-06-05: 20 mg via INTRAVENOUS

## 2024-06-05 MED ORDER — ACETAMINOPHEN 500 MG PO TABS
1000.0000 mg | ORAL_TABLET | Freq: Four times a day (QID) | ORAL | Status: DC
  Administered 2024-06-05 – 2024-06-09 (×15): 1000 mg via ORAL
  Filled 2024-06-05 (×16): qty 2

## 2024-06-05 MED ORDER — PROPOFOL 10 MG/ML IV BOLUS
INTRAVENOUS | Status: AC
  Filled 2024-06-05: qty 20

## 2024-06-05 MED ORDER — STERILE WATER FOR IRRIGATION IR SOLN
Status: DC | PRN
  Administered 2024-06-05: 1000 mL

## 2024-06-05 MED ORDER — CHLORHEXIDINE GLUCONATE 0.12 % MT SOLN
15.0000 mL | Freq: Once | OROMUCOSAL | Status: AC
Start: 2024-06-05 — End: 2024-06-05

## 2024-06-05 MED ORDER — FENTANYL CITRATE (PF) 100 MCG/2ML IJ SOLN
INTRAMUSCULAR | Status: DC | PRN
  Administered 2024-06-05 (×4): 12.5 ug via INTRAVENOUS
  Administered 2024-06-05 (×2): 25 ug via INTRAVENOUS

## 2024-06-05 MED ORDER — TRAMADOL HCL 50 MG PO TABS
50.0000 mg | ORAL_TABLET | Freq: Four times a day (QID) | ORAL | Status: DC
Start: 2024-06-05 — End: 2024-06-09
  Administered 2024-06-05 – 2024-06-09 (×16): 50 mg via ORAL
  Filled 2024-06-05 (×16): qty 1

## 2024-06-05 MED ORDER — HEMOSTATIC AGENTS (NO CHARGE) OPTIME
TOPICAL | Status: DC | PRN
  Administered 2024-06-05: 1 via TOPICAL

## 2024-06-05 MED ORDER — BUPIVACAINE-EPINEPHRINE (PF) 0.5% -1:200000 IJ SOLN
INTRAMUSCULAR | Status: AC
  Filled 2024-06-05: qty 30

## 2024-06-05 MED ORDER — CEFAZOLIN SODIUM-DEXTROSE 2-4 GM/100ML-% IV SOLN
2.0000 g | Freq: Once | INTRAVENOUS | Status: AC
  Administered 2024-06-05: 2 g via INTRAVENOUS

## 2024-06-05 MED ORDER — DEXMEDETOMIDINE HCL IN NACL 80 MCG/20ML IV SOLN
INTRAVENOUS | Status: AC
  Filled 2024-06-05: qty 20

## 2024-06-05 MED ORDER — ONDANSETRON HCL 4 MG/2ML IJ SOLN: 4.0000 mg | Freq: Once | INTRAMUSCULAR | Status: DC | PRN

## 2024-06-05 MED ORDER — FENTANYL CITRATE (PF) 100 MCG/2ML IJ SOLN
INTRAMUSCULAR | Status: AC
  Filled 2024-06-05: qty 2

## 2024-06-05 MED ORDER — ONDANSETRON HCL 4 MG/2ML IJ SOLN
INTRAMUSCULAR | Status: AC
Start: 2024-06-05 — End: 2024-06-05
  Filled 2024-06-05: qty 6

## 2024-06-05 MED ORDER — SODIUM PHOSPHATES 45 MMOLE/15ML IV SOLN
15.0000 mmol | Freq: Once | INTRAVENOUS | Status: AC
  Administered 2024-06-05: 15 mmol via INTRAVENOUS
  Filled 2024-06-05: qty 5

## 2024-06-05 MED ORDER — LACTATED RINGERS IV SOLN: INTRAVENOUS | Status: DC

## 2024-06-05 MED ORDER — OXYCODONE HCL 5 MG/5ML PO SOLN: 5.0000 mg | Freq: Once | ORAL | Status: DC | PRN

## 2024-06-05 MED ORDER — ONDANSETRON HCL 4 MG/2ML IJ SOLN
INTRAMUSCULAR | Status: DC | PRN
  Administered 2024-06-05: 4 mg via INTRAVENOUS

## 2024-06-05 MED ORDER — ORAL CARE MOUTH RINSE
15.0000 mL | Freq: Once | OROMUCOSAL | Status: AC
  Administered 2024-06-05: 15 mL via OROMUCOSAL

## 2024-06-05 MED ORDER — BUPIVACAINE-EPINEPHRINE (PF) 0.5% -1:200000 IJ SOLN
INTRAMUSCULAR | Status: DC | PRN
  Administered 2024-06-05: 30 mL via PERINEURAL

## 2024-06-05 MED ORDER — FENTANYL CITRATE (PF) 50 MCG/ML IJ SOSY
25.0000 ug | PREFILLED_SYRINGE | INTRAMUSCULAR | Status: DC | PRN
  Administered 2024-06-05 (×2): 25 ug via INTRAVENOUS
  Filled 2024-06-05: qty 1

## 2024-06-05 MED ORDER — CEFAZOLIN SODIUM-DEXTROSE 2-4 GM/100ML-% IV SOLN
INTRAVENOUS | Status: AC
  Filled 2024-06-05: qty 100

## 2024-06-05 SURGICAL SUPPLY — 50 items
BIT DRILL AO GAMMA 4.2X300 (BIT) ×1 IMPLANT
BLADE SURG SZ10 CARB STEEL (BLADE) ×2 IMPLANT
BNDG GAUZE DERMACEA FLUFF 4 (GAUZE/BANDAGES/DRESSINGS) ×1 IMPLANT
CHLORAPREP W/TINT 26 (MISCELLANEOUS) ×1 IMPLANT
CLOTH BEACON ORANGE TIMEOUT ST (SAFETY) ×1 IMPLANT
COUNTER NDL MAGNETIC 40 RED (SET/KITS/TRAYS/PACK) ×1 IMPLANT
COUNTER NEEDLE MAGNETIC 40 RED (SET/KITS/TRAYS/PACK) ×1 IMPLANT
COVER LIGHT HANDLE STERIS (MISCELLANEOUS) ×2 IMPLANT
COVER MAYO STAND XLG (MISCELLANEOUS) ×1 IMPLANT
COVER PERINEAL POST (MISCELLANEOUS) ×1 IMPLANT
DRAPE STERI IOBAN 125X83 (DRAPES) ×1 IMPLANT
DRESSING AQUACEL AG ADV 3.5X12 (MISCELLANEOUS) ×1 IMPLANT
DRSG MEPILEX SACRM 8.7X9.8 (GAUZE/BANDAGES/DRESSINGS) ×1 IMPLANT
ELECTRODE REM PT RTRN 9FT ADLT (ELECTROSURGICAL) ×1 IMPLANT
GLOVE BIOGEL PI IND STRL 7.0 (GLOVE) ×2 IMPLANT
GLOVE BIOGEL PI IND STRL 8.5 (GLOVE) ×1 IMPLANT
GLOVE SKINSENSE STRL SZ8.0 LF (GLOVE) ×1 IMPLANT
GOWN STRL REUS W/TWL LRG LVL3 (GOWN DISPOSABLE) ×1 IMPLANT
GOWN STRL REUS W/TWL XL LVL3 (GOWN DISPOSABLE) ×1 IMPLANT
GUIDEROD T2 3X1000 (ROD) ×1 IMPLANT
INST SET MAJOR BONE (KITS) ×1 IMPLANT
KIT TURNOVER CYSTO (KITS) ×1 IMPLANT
KWIRE 3.2X450M STR (WIRE) ×1 IMPLANT
MANIFOLD NEPTUNE II (INSTRUMENTS) ×1 IMPLANT
MARKER SKIN DUAL TIP RULER LAB (MISCELLANEOUS) ×1 IMPLANT
NAIL TROCH GAMMA 11X18 (Nail) IMPLANT
NDL HYPO 21X1.5 SAFETY (NEEDLE) ×1 IMPLANT
NDL SPNL 18GX3.5 QUINCKE PK (NEEDLE) ×1 IMPLANT
NEEDLE HYPO 21X1.5 SAFETY (NEEDLE) ×1 IMPLANT
NEEDLE SPNL 18GX3.5 QUINCKE PK (NEEDLE) ×1 IMPLANT
PACK SRG BSC III STRL LF ECLPS (CUSTOM PROCEDURE TRAY) ×1 IMPLANT
PAD ABD 5X9 TENDERSORB (GAUZE/BANDAGES/DRESSINGS) ×1 IMPLANT
PAD ARMBOARD POSITIONER FOAM (MISCELLANEOUS) ×1 IMPLANT
PENCIL SMOKE EVACUATOR COATED (MISCELLANEOUS) ×1 IMPLANT
POSITIONER HEAD 8X9X4 ADT (SOFTGOODS) ×1 IMPLANT
POWDER SURGICEL 3.0 GRAM (HEMOSTASIS) IMPLANT
SCREW LAG GAMMA 3 TI 10.5X90MM (Screw) IMPLANT
SCREW LOCKING T2 F/T 5X32.5MM (Screw) IMPLANT
SET BASIN LINEN APH (SET/KITS/TRAYS/PACK) ×1 IMPLANT
SOLN 0.9% NACL POUR BTL 1000ML (IV SOLUTION) ×1 IMPLANT
SPONGE T-LAP 18X18 ~~LOC~~+RFID (SPONGE) ×2 IMPLANT
STAPLER VISISTAT (STAPLE) IMPLANT
SUT BRALON NAB BRD #1 30IN (SUTURE) IMPLANT
SUT MNCRL 0 VIOLET CTX 36 (SUTURE) ×1 IMPLANT
SUT MON AB 0 CT1 (SUTURE) IMPLANT
SUT MON AB 2-0 CT1 36 (SUTURE) ×1 IMPLANT
SYR 30ML LL (SYRINGE) ×1 IMPLANT
SYR BULB IRRIG 60ML STRL (SYRINGE) ×2 IMPLANT
TUBE CONNECTING 12X1/4 (SUCTIONS) IMPLANT
YANKAUER SUCT BULB TIP NO VENT (SUCTIONS) ×1 IMPLANT

## 2024-06-05 NOTE — Anesthesia Postprocedure Evaluation (Signed)
 Anesthesia Post Note  Patient: Charae Depaolis Vanbuskirk  Procedure(s) Performed: FIXATION, FRACTURE, INTERTROCHANTERIC, WITH INTRAMEDULLARY ROD (Right: Hip)  Patient location during evaluation: Phase II Anesthesia Type: General Level of consciousness: awake Pain management: pain level controlled Vital Signs Assessment: post-procedure vital signs reviewed and stable Respiratory status: spontaneous breathing and respiratory function stable Cardiovascular status: blood pressure returned to baseline and stable Postop Assessment: no headache and no apparent nausea or vomiting Anesthetic complications: no Comments: Late entry   No notable events documented.   Last Vitals:  Vitals:   06/05/24 1230 06/05/24 1245  BP: (!) 73/47 (!) 80/38  Pulse:    Resp: (!) 22 20  Temp:    SpO2: 96%     Last Pain:  Vitals:   06/05/24 1137  TempSrc: Oral  PainSc:                  Yvonna JINNY Bosworth

## 2024-06-05 NOTE — Plan of Care (Signed)

## 2024-06-05 NOTE — Op Note (Signed)
 06/05/2024  9:27 AM  PATIENT:  Pam [name]  83 y.o. female  PRE-OPERATIVE DIAGNOSIS:  right hip fracture  POST-OPERATIVE DIAGNOSIS:  right hip fracture  PROCEDURE:  Procedure(s): FIXATION, FRACTURE, INTERTROCHANTERIC, WITH INTRAMEDULLARY ROD (Right)  SURGEON:  Surgeons and Role:    * Margrette Taft BRAVO, MD - Primary  PHYSICIAN ASSISTANT:   ASSISTANTS: none   ANESTHESIA:   general  EBL:  150 mL   BLOOD ADMINISTERED:none  DRAINS: none   LOCAL MEDICATIONS USED:  MARCAINE      SPECIMEN:  No Specimen  DISPOSITION OF SPECIMEN:  N/A  COUNTS:  YES  TOURNIQUET:  * No tourniquets in log *  DICTATION: .Dragon Dictation  PLAN OF CARE: Admit to inpatient   PATIENT DISPOSITION:  PACU - hemodynamically stable.   Delay start of Pharmacological VTE agent (>24hrs) due to surgical blood loss or risk of bleeding: yes   Open treatment internal fixation of the hip with CEPHALOMEDULLARY nail  Orthopaedic Surgery Operative Note (CSN: [id number])  Pam [name]  08-10-1940 Date of Surgery: 06/05/2024     Implants: Implant Name Type Inv. Item Serial No. Manufacturer Lot No. LRB No. Used Action  NAIL TROCH GAMMA 11X18 - [id number] Nail NAIL TROCH GAMMA 11X18  [employer] [id number] Right 1 Implanted  SCREW LAG GAMMA 3 TI 10.5X90MM - [id number] Screw SCREW LAG GAMMA 3 TI 10.5X90MM  [employer] [id number] Right 1 Implanted  SCREW LOCKING T2 F/T 5X32.5MM - [id number] Screw SCREW LOCKING T2 F/T 5X32.5MM  [employer] [id number] Right 1 Implanted    The surgery was done as follows: The patient was identified in the preop area the surgical site (right hip) was confirmed and marked after thorough chart review including radiographs; implants were checked personally.  The patient was brought back to the operating room for anesthesia followed by Foley catheter insertion, and then was placed on the fracture table. The operative leg was placed in traction the  nonoperative leg was padded and placed in a boot and abducted  The C-arm was brought in and a closed reduction was performed with the C-arm.  Multiplane x-rays were taken and the leg was manipulated until a stable reduction was obtained using traction to obtain proper neck shaft angle and then rotation to correct rotational malalignment  The right leg was prepped and draped sterilely followed by timeout procedure which confirmed images surgery site and implants x-ray badges.   The incision was made over the right hip at the greater trochanter extended proximally, subcutaneous tissue was divided down to the fascial layer which was split in line with the skin and incision.  This was followed by blunt dissection with a finger down to the tip of the trochanter  The sharp tipped awl was passed down to the level of the lesser TROCHANTER  followed by insertion of a guidewire down to the knee.  X-ray confirmed position of the guidewire and proximal reaming was performed down to the level of the lesser trochanter.  We then passed a  125 degree short cephalomedullary nail  A second incision was made distal to the initial incision and the subcutaneous tissue was divided, muscle and fascia were split down to bone   After correction for rotation (a perforating drill was used to breech the cortex of the lateral femur followed by) insertion of a threaded guidewire which was placed in the center of the femoral head on AP and lateral x-ray.  This required posterior pressure on the hip to correct posterior sag  We measured the pin distance at 85 and then set the reamer to the same number  (90)  followed by reaming over the guidewire.  The 90 lag screw was then placed over the guidewire and the guidewire was removed.   We next used the external guide to place a third incision and then passed a cannula with a sharp tip followed by drilling with a appropriate drill measuring off the drill bit and placed a 32.5 locking  screw Final images confirmed reduction of the fracture and hardware position.  The wound was irrigated with copious amounts of saline   Powdered Surgicel was placed in the deep layer followed by closure in layered fashion starting with #1 Surgilon for the fascia later 0 Monocryl in 2 layers for the proximal wound and 0 Monocryl for each distal wound We used 30 ml of Marcaine  with epinephrine dividing it between each layer  A sterile bandage was applied  Postoperative plan Weightbearing as tolerated Staples if present can be removed postop day 12-14 Anticoagulation for 28 days resume Eliquis  in 24 hours Follow-up visit at 4 weeks for x-rays and then x-rays at 8 weeks and 12 weeks [id number]

## 2024-06-05 NOTE — Care Management Important Message (Signed)
 Important Message  Patient Details  Name: Pam [name] MRN: [id number] Date of Birth: 17-Mar-1941   Important Message Given:  Yes - Medicare IM     [name] L [name] 06/05/2024, 11:34 AM

## 2024-06-05 NOTE — Anesthesia Procedure Notes (Signed)
 Procedure Name: LMA Insertion Date/Time: 06/05/2024 7:38 AM  Performed by: Barbarann Verneita RAMAN, CRNAPre-anesthesia Checklist: Patient identified, Patient being monitored, Emergency Drugs available, Timeout performed and Suction available Patient Re-evaluated:Patient Re-evaluated prior to induction Oxygen Delivery Method: Circle System Utilized Preoxygenation: Pre-oxygenation with 100% oxygen Induction Type: IV induction Ventilation: Mask ventilation without difficulty LMA: LMA inserted LMA Size: 3.0 Number of attempts: 1 Placement Confirmation: positive ETCO2 and breath sounds checked- equal and bilateral Tube secured with: Tape Dental Injury: Teeth and Oropharynx as per pre-operative assessment

## 2024-06-05 NOTE — Interval H&P Note (Signed)
 History and Physical Interval Note:  06/05/2024 7:17 AM  Pam [name]  has presented today for surgery, with the diagnosis of right hip fracture.  The various methods of treatment have been discussed with the patient and family. After consideration of risks, benefits and other options for treatment, the patient has consented to  Procedure(s) with comments: FIXATION, FRACTURE, INTERTROCHANTERIC, WITH INTRAMEDULLARY ROD (Right) - gamma as a surgical intervention.  The patient's history has been reviewed, patient examined, no change in status, stable for surgery.  I have reviewed the patient's chart and labs.  Questions were answered to the patient's satisfaction.     [name]

## 2024-06-05 NOTE — Telephone Encounter (Signed)
 I received an FMLA form for pts daughter. The form was received being completed by pts daughter. IC, spoke with Apolinar Horn,pts daughter and advised that I need a blank form to complete that form is for the provider to complete. She expressed understanding and will fax a blank form tomorrow and should have on Monday.

## 2024-06-05 NOTE — Transfer of Care (Signed)
 Immediate Anesthesia Transfer of Care Note  Patient: Pam [name]  Procedure(s) Performed: FIXATION, FRACTURE, INTERTROCHANTERIC, WITH INTRAMEDULLARY ROD (Right: Hip)  Patient Location: PACU  Anesthesia Type:General  Level of Consciousness: awake and patient cooperative  Airway & Oxygen Therapy: Patient Spontanous Breathing and Patient connected to nasal cannula oxygen  Post-op Assessment: Report given to RN and Post -op Vital signs reviewed and stable  Post vital signs: Reviewed and stable  Last Vitals:  Vitals Value Taken Time  BP 133/80 06/05/24 09:33  Temp 98.3 06/05/2024   0937  Pulse 100 06/05/24 09:37  Resp 19 06/05/24 09:37  SpO2 98 % 06/05/24 09:37  Vitals shown include unfiled device data.  Last Pain:  Vitals:   06/05/24 0655  TempSrc:   PainSc: 0-No pain      Patients Stated Pain Goal: 0 (06/05/24 0500)  Complications: No notable events documented.

## 2024-06-05 NOTE — Anesthesia Preprocedure Evaluation (Signed)
 Anesthesia Evaluation  Patient identified by MRN, date of birth, ID band Patient awake    Reviewed: Allergy & Precautions, H&P , NPO status , Patient's Chart, lab work & pertinent test results, reviewed documented beta blocker date and time   Airway Mallampati: II  TM Distance: >3 FB Neck ROM: full    Dental no notable dental hx.    Pulmonary neg pulmonary ROS, former smoker   Pulmonary exam normal breath sounds clear to auscultation       Cardiovascular Exercise Tolerance: Good hypertension, + dysrhythmias Atrial Fibrillation  Rhythm:regular Rate:Normal     Neuro/Psych negative neurological ROS  negative psych ROS   GI/Hepatic negative GI ROS, Neg liver ROS,,,  Endo/Other  negative endocrine ROS    Renal/GU negative Renal ROS  negative genitourinary   Musculoskeletal   Abdominal   Peds  Hematology negative hematology ROS (+)   Anesthesia Other Findings   Reproductive/Obstetrics negative OB ROS                              Anesthesia Physical Anesthesia Plan  ASA: 3  Anesthesia Plan: General and General LMA   Post-op Pain Management:    Induction:   PONV Risk Score and Plan: Ondansetron   Airway Management Planned:   Additional Equipment:   Intra-op Plan:   Post-operative Plan:   Informed Consent: I have reviewed the patients History and Physical, chart, labs and discussed the procedure including the risks, benefits and alternatives for the proposed anesthesia with the patient or authorized representative who has indicated his/her understanding and acceptance.     Dental Advisory Given  Plan Discussed with: CRNA  Anesthesia Plan Comments:         Anesthesia Quick Evaluation

## 2024-06-05 NOTE — Brief Op Note (Signed)
 06/01/2024 - 06/05/2024  9:27 AM  PATIENT:  Pam [name] Fitting  83 y.o. female  PRE-OPERATIVE DIAGNOSIS:  right hip fracture  POST-OPERATIVE DIAGNOSIS:  right hip fracture  PROCEDURE:  Procedure(s): FIXATION, FRACTURE, INTERTROCHANTERIC, WITH INTRAMEDULLARY ROD (Right)  SURGEON:  Surgeons and Role:    DEWAINE Margrette Taft FORBES, MD - Primary  PHYSICIAN ASSISTANT:   ASSISTANTS: none   ANESTHESIA:   general  EBL:  150 mL   BLOOD ADMINISTERED:none  DRAINS: none   LOCAL MEDICATIONS USED:  MARCAINE      SPECIMEN:  No Specimen  DISPOSITION OF SPECIMEN:  N/A  COUNTS:  YES  TOURNIQUET:  * No tourniquets in log *  DICTATION: .Dragon Dictation  PLAN OF CARE: Admit to inpatient   PATIENT DISPOSITION:  PACU - hemodynamically stable.   Delay start of Pharmacological VTE agent (>24hrs) due to surgical blood loss or risk of bleeding: yes

## 2024-06-05 NOTE — Progress Notes (Signed)
 PROGRESS NOTE   Pam [name]  FMW:[id number] DOB: Jul 22, 1941 DOA: 06/01/2024 PCP: Orpha Yancey LABOR, MD   Chief Complaint  Patient presents with   Fall   Level of care: ICU  Brief Admission History:  83 y.o. female with medical history significant for permanent atrial fibrillation on Eliquis , hypertension, chronic HFpEF, CKD 3B, who presents to the ER after a mechanical fall at home.  States she was doing well prior to this fall. In the ER, right hip x-ray shows acute impacted intertrochanteric fracture of the right femur.  EDP discussed the case with orthopedic surgery Dr. Margrette.   Eliquis  held and patient has been started on heparin  drip for stroke prophylaxis in the setting of atrial fibrillation, stopped on 11/12.  Plan was to take her to OR on 11/12 but she was hypotensive and had low sodium so surgery was canceled and will be scheduled later.  She will need PT/OT evaluation after the surgery and possible rehab placement versus home health services arrangement, depending on how she does.   Assessment and Plan:  Hemorrhagic Shock -- TREATED  -- Patient required initiation of IV pressors, norepinephrine was started -- Plan is to wean IV norepinephrine as able over the next 24 hours -- Patient was transfused 3 units PRBC on 06/03/2024  Acute impacted intertrochanteric fracture of the right femur post mechanical fall -- Right hip x-ray shows acute impacted intertrochanteric fracture of the right femur -- Pain control as needed -- Orthopedic surgery, Dr. Margrette, consulted and taken to OR for ORIF on 06/05/24  -- continue supportive measures for now -- PT / OT eval tomorrow for ambulation -- per ortho can restart apixaban  in 24 hours (06/06/24)  ABLA Chronic normocytic anemia -- s/p 3 units PRBC transfusion on 06/03/2024 -- continue to follow Hg -- per ortho can restart apixaban  06/06/24   Hyponatremia -- Sodium improved to 134 after IV fluids and PRBC -- Follow   AKI  on CKD 3b - resolved  -- creatinine improved after IV fluids and PRBC transfusion -- creatinine was 0.96 today  Permanent Afib  -- holding home apixaban  for surgery, resume on 06/06/24 per ortho -- holding IV heparin  for now  Chronic HFpEF -- appears compensated at this time -- monitoring weights, I/Os Filed Weights   06/04/24 0338 06/05/24 0445 06/05/24 0655  Weight: 64.8 kg 65.2 kg 65.2 kg    Intake/Output Summary (Last 24 hours) at 06/05/2024 1559 Last data filed at 06/05/2024 1528 Gross per 24 hour  Intake 3123.09 ml  Output 2050 ml  Net 1073.09 ml   Leukocytosis -- reactive from recent fracture -- no s/s of infection found -- follow   Mechanical Fall  -- Noncontrast head CT with no acute intracranial abnormality. -- Right hip x-ray shows acute impacted intertrochanteric fracture of the right femur. -- Continue fall precautions. -- PT/OT evaluation after the surgery   DVT prophylaxis: SCDs Code Status: Full  Family Communication:  Disposition: TBD    Consultants:  ortho Procedures:   Antimicrobials:    Subjective: Pt says she tolerated surgery well and not having much pain at this time.  She was able to eat some after surgery and tolerated well.    Objective: Vitals:   06/05/24 1415 06/05/24 1430 06/05/24 1445 06/05/24 1500  BP: (!) 101/43 104/88 (!) 109/42 (!) 124/97  Pulse:  98    Resp: (!) 22 18 (!) 25 18  Temp:      TempSrc:      SpO2: 95%  100% 96% 90%  Weight:      Height:        Intake/Output Summary (Last 24 hours) at 06/05/2024 1559 Last data filed at 06/05/2024 1528 Gross per 24 hour  Intake 3123.09 ml  Output 2050 ml  Net 1073.09 ml   Filed Weights   06/04/24 0338 06/05/24 0445 06/05/24 0655  Weight: 64.8 kg 65.2 kg 65.2 kg   Examination:  General exam: Appears calm and comfortable  Respiratory system: Clear to auscultation. Respiratory effort normal. Cardiovascular system: normal S1 & S2 heard. No JVD, murmurs, rubs, gallops  or clicks. No pedal edema. Gastrointestinal system: Abdomen is nondistended, soft and nontender. No organomegaly or masses felt. Normal bowel sounds heard. Central nervous system: Alert and oriented. No focal neurological deficits. Extremities: good palpable pedal pulses and bandage clean and dry.  Skin: No rashes, lesions or ulcers. Psychiatry: Judgement and insight appear normal. Mood & affect appropriate.   Data Reviewed: I have personally reviewed following labs and imaging studies  CBC: Recent Labs  Lab 06/01/24 1853 06/02/24 0446 06/03/24 0441 06/03/24 1232 06/04/24 0428 06/05/24 0308  WBC 12.1* 9.9 10.3 8.4 12.7* 12.2*  NEUTROABS 10.0* 7.8*  --   --   --   --   HGB 11.8* 10.6* 9.6* 8.5* 10.7* 11.6*  HCT 34.9* 31.3* 29.0* 27.0* 31.8* 34.6*  MCV 89.5 88.9 92.1 95.4 90.1 90.1  PLT 304 248 219 171 183 220    Basic Metabolic Panel: Recent Labs  Lab 06/01/24 1853 06/02/24 0446 06/03/24 1232 06/04/24 0428 06/05/24 0308  NA 126* 127* 132* 134* 133*  K 5.2* 4.7 4.4 3.8 4.3  CL 89* 90* 101 101 99  CO2 26 27 22  21* 25  GLUCOSE 116* 108* 94 188* 139*  BUN 43* 42* 26* 17 15  CREATININE 1.87* 1.79* 1.41* 1.23* 0.96  CALCIUM 8.8* 8.5* 7.5* 7.1* 8.0*  MG  --  2.2 2.4  --   --   PHOS  --  4.9*  --   --  1.7*    CBG: No results for input(s): GLUCAP in the last 168 hours.  Recent Results (from the past 240 hours)  Surgical pcr screen     Status: None   Collection Time: 06/03/24  5:00 AM   Specimen: Nasal Mucosa; Nasal Swab  Result Value Ref Range Status   MRSA, PCR NEGATIVE NEGATIVE Final   Staphylococcus aureus NEGATIVE NEGATIVE Final    Comment: (NOTE) The Xpert SA Assay (FDA approved for NASAL specimens in patients 57 years of age and older), is one component of a comprehensive surveillance program. It is not intended to diagnose infection nor to guide or monitor treatment. Performed at [facility], [address] [place], [place] [address]      Radiology  Studies: DG FEMUR, MIN 2 VIEWS RIGHT Result Date: 06/05/2024 CLINICAL DATA:  Pain, fracture. EXAM: RIGHT FEMUR 2 VIEWS COMPARISON:  Preoperative imaging FINDINGS: Ten fluoroscopic spot views of the right proximal femur submitted from the operating room. Femoral intramedullary nail with trans trochanteric and distal locking screw fixation of proximal femur fracture. Fluoroscopy time 3 minutes 54 seconds. Dose 72.01 mGy. IMPRESSION: Intraoperative fluoroscopy during ORIF of proximal femur fracture. Electronically Signed   By: Andrea Gasman M.D.   On: 06/05/2024 12:01   DG HIP UNILAT WITH PELVIS 2-3 VIEWS RIGHT Result Date: 06/05/2024 CLINICAL DATA:  Hip fracture. EXAM: DG HIP (WITH OR WITHOUT PELVIS) 2-3V*R* COMPARISON:  Preoperative imaging FINDINGS: Femoral intramedullary nail with trans trochanteric and  distal locking screw fixation of proximal femur fracture. Persistent displacement of the lesser trochanteric fracture fragment. Recent postsurgical change includes air and edema in the soft tissues. Overlying skin staples in place. The bones are subjectively under mineralized. IMPRESSION: ORIF of proximal femur fracture. Electronically Signed   By: Andrea Gasman M.D.   On: 06/05/2024 12:01   DG C-Arm 1-60 Min-No Report Result Date: 06/05/2024 Fluoroscopy was utilized by the requesting physician.  No radiographic interpretation.   DG CHEST PORT 1 VIEW Result Date: 06/03/2024 CLINICAL DATA:  PICC placement. EXAM: PORTABLE CHEST 1 VIEW COMPARISON:  Chest radiograph dated 06/01/2024 FINDINGS: Right-sided PICC with tip over central SVC. No focal consolidation, pleural effusion or pneumothorax. Stable cardiomegaly. Atherosclerotic calcification of the aorta. No acute osseous pathology. Left axillary surgical clips. IMPRESSION: Right-sided PICC with tip over central SVC. Electronically Signed   By: Vanetta Chou M.D.   On: 06/03/2024 18:07    Scheduled Meds:  acetaminophen   1,000 mg Oral Q6H    acyclovir ointment   Topical 5 X Daily   Chlorhexidine  Gluconate Cloth  6 each Topical Daily   gabapentin   200 mg Oral QHS   rOPINIRole   1 mg Oral QHS   rosuvastatin  5 mg Oral QHS   sodium chloride  flush  10-40 mL Intracatheter Q12H   traMADol   50 mg Oral Q6H   Continuous Infusions:  sodium chloride      norepinephrine (LEVOPHED) Adult infusion 7 mcg/min (06/05/24 1528)   sodium PHOSPHATE  IVPB (in mmol) 43 mL/hr at 06/05/24 1528     LOS: 4 days   Critical Care Procedure Note Authorized and Performed by: KYM Louder MD  Total Critical Care time:  55 mins Due to a high probability of clinically significant, life threatening deterioration, the patient required my highest level of preparedness to intervene emergently and I personally spent this critical care time directly and personally managing the patient.  This critical care time included obtaining a history; examining the patient, pulse oximetry; ordering and review of studies; arranging urgent treatment with development of a management plan; evaluation of patient's response of treatment; frequent reassessment; and discussions with other providers.  This critical care time was performed to assess and manage the high probability of imminent and life threatening deterioration that could result in multi-organ failure.  It was exclusive of separately billable procedures and treating other patients and teaching time.    Afton Louder, MD How to contact the [facility] Attending or Consulting provider 7A - 7P or covering provider during after hours 7P -7A, for this patient?  Check the care team in [facility] and look for a) attending/consulting [facility] provider listed and b) the [facility] team listed Log into [email or web address] to find provider on call.  Locate the [facility] provider you are looking for under [facility] and page to a number that you can be directly reached. If you still have difficulty reaching the provider, please page the [facility] (Director on Call) for the  Hospitalists listed on amion for assistance.  06/05/2024, 3:59 PM

## 2024-06-06 DIAGNOSIS — I4891 Unspecified atrial fibrillation: Secondary | ICD-10-CM | POA: Diagnosis not present

## 2024-06-06 DIAGNOSIS — R578 Other shock: Secondary | ICD-10-CM | POA: Diagnosis not present

## 2024-06-06 DIAGNOSIS — S7291XD Unspecified fracture of right femur, subsequent encounter for closed fracture with routine healing: Secondary | ICD-10-CM | POA: Diagnosis not present

## 2024-06-06 DIAGNOSIS — N179 Acute kidney failure, unspecified: Secondary | ICD-10-CM | POA: Diagnosis not present

## 2024-06-06 LAB — BASIC METABOLIC PANEL WITH GFR
Anion gap: 8 (ref 5–15)
BUN: 15 mg/dL (ref 8–23)
CO2: 26 mmol/L (ref 22–32)
Calcium: 7.5 mg/dL — ABNORMAL LOW (ref 8.9–10.3)
Chloride: 100 mmol/L (ref 98–111)
Creatinine, Ser: 0.97 mg/dL (ref 0.44–1.00)
GFR, Estimated: 59 mL/min — ABNORMAL LOW (ref >60)
Glucose, Bld: 116 mg/dL — ABNORMAL HIGH (ref 70–99)
Potassium: 4.3 mmol/L (ref 3.5–5.1)
Sodium: 133 mmol/L — ABNORMAL LOW (ref 135–145)

## 2024-06-06 LAB — CBC
HCT: 29.5 % — ABNORMAL LOW (ref 36.0–46.0)
Hemoglobin: 9.7 g/dL — ABNORMAL LOW (ref 12.0–15.0)
MCH: 30.4 pg (ref 26.0–34.0)
MCHC: 32.9 g/dL (ref 30.0–36.0)
MCV: 92.5 fL (ref 80.0–100.0)
Platelets: 196 K/uL (ref 150–400)
RBC: 3.19 MIL/uL — ABNORMAL LOW (ref 3.87–5.11)
RDW: 13.4 % (ref 11.5–15.5)
WBC: 8.3 K/uL (ref 4.0–10.5)
nRBC: 0 % (ref 0.0–0.2)

## 2024-06-06 LAB — PHOSPHORUS: Phosphorus: 3.3 mg/dL (ref 2.5–4.6)

## 2024-06-06 MED ORDER — CARVEDILOL 3.125 MG PO TABS
3.1250 mg | ORAL_TABLET | Freq: Two times a day (BID) | ORAL | Status: DC
  Administered 2024-06-06 – 2024-06-07 (×3): 3.125 mg via ORAL
  Filled 2024-06-06 (×4): qty 1

## 2024-06-06 MED ORDER — APIXABAN 5 MG PO TABS
5.0000 mg | ORAL_TABLET | Freq: Two times a day (BID) | ORAL | Status: DC
  Administered 2024-06-06 – 2024-06-07 (×3): 5 mg via ORAL
  Filled 2024-06-06 (×3): qty 1

## 2024-06-06 NOTE — Plan of Care (Signed)

## 2024-06-06 NOTE — Progress Notes (Addendum)
 PROGRESS NOTE   Pam [name]  FMW:[id number] DOB: [birth year] DOA: 06/01/2024 PCP: Orpha Yancey LABOR, MD   Chief Complaint  Patient presents with   Fall   Level of care: ICU  Brief Admission History:  83 y.o. female with medical history significant for permanent atrial fibrillation on Eliquis , hypertension, chronic HFpEF, CKD 3B, who presents to the ER after a mechanical fall at home.  States she was doing well prior to this fall. In the ER, right hip x-ray shows acute impacted intertrochanteric fracture of the right femur.  EDP discussed the case with orthopedic surgery Dr. Margrette.   Eliquis  held and patient has been started on heparin  drip for stroke prophylaxis in the setting of atrial fibrillation, stopped on 11/12.  Plan was to take her to OR on 11/12 but she was hypotensive and had low sodium so surgery was canceled and will be scheduled later.  She will need PT/OT evaluation after the surgery and possible rehab placement versus home health services arrangement, depending on how she does.   Assessment and Plan:  Hemorrhagic Shock -- TREATED  -- Patient required initiation of IV pressors, norepinephrine was started -- Plan is to wean IV norepinephrine as able over the next 24 hours -- Patient was transfused 3 units PRBC on 06/03/2024  Acute impacted intertrochanteric fracture of the right femur post mechanical fall -- Right hip x-ray shows acute impacted intertrochanteric fracture of the right femur -- Pain control as needed -- Orthopedic surgery, Dr. Margrette, consulted and taken to OR for ORIF on 06/05/24  -- continue supportive measures for now -- PT / OT eval for ambulation, weight bearing as tolerated per ortho -- per ortho can restart apixaban  in 24 hours (06/06/24)  ABLA Chronic normocytic anemia -- s/p 3 units PRBC transfusion on 06/03/2024 -- continue to follow Hg -- per ortho can restart apixaban  06/06/24   Hyponatremia -- Sodium improved to 134 after IV  fluids and PRBC but now holding at 133  -- Follow   AKI on CKD 3b - resolved  -- creatinine improved after IV fluids and PRBC transfusion -- creatinine 0.97 today  Permanent Afib  -- initially held home apixaban  for surgery, resume on 06/06/24 per ortho -- resume apixaban  per ortho today -- carvedilol had been held initially due to hypotension, now Atrial fib rates becoming more labile, we restarted carvedilol today at lower dose of 3.125 mg BID and if BP tolerates we might be able to get her back up to home dose of 6.25 mg BID  Chronic HFpEF -- appears compensated at this time -- monitoring weights, I/Os Filed Weights   06/05/24 0445 06/05/24 0655 06/06/24 0418  Weight: 65.2 kg 65.2 kg 66.8 kg    Intake/Output Summary (Last 24 hours) at 06/06/2024 1459 Last data filed at 06/06/2024 1349 Gross per 24 hour  Intake 1598.55 ml  Output 1460 ml  Net 138.55 ml   Leukocytosis--resolved now -- reactive from recent fracture -- no s/s of infection found -- WBC normal at 8.3 today  Mechanical Fall  -- Noncontrast head CT with no acute intracranial abnormality. -- Right hip x-ray shows acute impacted intertrochanteric fracture of the right femur. -- pt is postop s/p ORIF on 06/05/24 - routine postop plan per ortho  -- PT/OT evaluation pending  DVT prophylaxis: apixaban  Code Status: Full  Family Communication:  Disposition: TBD    Consultants:  ortho Procedures:   Antimicrobials:    Subjective: Pt says that the pain is much better controlled  and she is ready to try to work with PT.    Objective: Vitals:   06/06/24 1300 06/06/24 1330 06/06/24 1400 06/06/24 1448  BP:  (!) 125/59  (!) 116/48  Pulse:      Resp: 17 17    Temp:      TempSrc:      SpO2:   93%   Weight:      Height:        Intake/Output Summary (Last 24 hours) at 06/06/2024 1459 Last data filed at 06/06/2024 1349 Gross per 24 hour  Intake 1598.55 ml  Output 1460 ml  Net 138.55 ml   Filed Weights    06/05/24 0445 06/05/24 0655 06/06/24 0418  Weight: 65.2 kg 65.2 kg 66.8 kg   Examination:  General exam: Appears calm and comfortable  Respiratory system: Clear to auscultation. Respiratory effort normal. Cardiovascular system: tachycardic rate, irregularly irregular, normal S1 & S2 heard. No JVD, murmurs, rubs, gallops or clicks. No pedal edema. Gastrointestinal system: Abdomen is nondistended, soft and nontender. No organomegaly or masses felt. Normal bowel sounds heard. Central nervous system: Alert and oriented. No focal neurological deficits. Extremities: good palpable pedal pulses and bandage clean and dry.  Skin: No rashes, lesions or ulcers. Psychiatry: Judgement and insight appear normal. Mood & affect appropriate.   Data Reviewed: I have personally reviewed following labs and imaging studies  CBC: Recent Labs  Lab 06/01/24 1853 06/02/24 0446 06/03/24 0441 06/03/24 1232 06/04/24 0428 06/05/24 0308 06/06/24 0328  WBC 12.1* 9.9 10.3 8.4 12.7* 12.2* 8.3  NEUTROABS 10.0* 7.8*  --   --   --   --   --   HGB 11.8* 10.6* 9.6* 8.5* 10.7* 11.6* 9.7*  HCT 34.9* 31.3* 29.0* 27.0* 31.8* 34.6* 29.5*  MCV 89.5 88.9 92.1 95.4 90.1 90.1 92.5  PLT 304 248 219 171 183 220 196    Basic Metabolic Panel: Recent Labs  Lab 06/02/24 0446 06/03/24 1232 06/04/24 0428 06/05/24 0308 06/06/24 0328  NA 127* 132* 134* 133* 133*  K 4.7 4.4 3.8 4.3 4.3  CL 90* 101 101 99 100  CO2 27 22 21* 25 26  GLUCOSE 108* 94 188* 139* 116*  BUN 42* 26* 17 15 15   CREATININE 1.79* 1.41* 1.23* 0.96 0.97  CALCIUM 8.5* 7.5* 7.1* 8.0* 7.5*  MG 2.2 2.4  --   --   --   PHOS 4.9*  --   --  1.7* 3.3    CBG: No results for input(s): GLUCAP in the last 168 hours.  Recent Results (from the past 240 hours)  Surgical pcr screen     Status: None   Collection Time: 06/03/24  5:00 AM   Specimen: Nasal Mucosa; Nasal Swab  Result Value Ref Range Status   MRSA, PCR NEGATIVE NEGATIVE Final   Staphylococcus  aureus NEGATIVE NEGATIVE Final    Comment: (NOTE) The Xpert SA Assay (FDA approved for NASAL specimens in patients 68 years of age and older), is one component of a comprehensive surveillance program. It is not intended to diagnose infection nor to guide or monitor treatment. Performed at [facility], [address] [place], [place] [address]      Radiology Studies: DG FEMUR, MIN 2 VIEWS RIGHT Result Date: 06/05/2024 CLINICAL DATA:  Pain, fracture. EXAM: RIGHT FEMUR 2 VIEWS COMPARISON:  Preoperative imaging FINDINGS: Ten fluoroscopic spot views of the right proximal femur submitted from the operating room. Femoral intramedullary nail with trans trochanteric and distal locking screw fixation of proximal  femur fracture. Fluoroscopy time 3 minutes 54 seconds. Dose 72.01 mGy. IMPRESSION: Intraoperative fluoroscopy during ORIF of proximal femur fracture. Electronically Signed   By: Andrea Gasman M.D.   On: 06/05/2024 12:01   DG HIP UNILAT WITH PELVIS 2-3 VIEWS RIGHT Result Date: 06/05/2024 CLINICAL DATA:  Hip fracture. EXAM: DG HIP (WITH OR WITHOUT PELVIS) 2-3V*R* COMPARISON:  Preoperative imaging FINDINGS: Femoral intramedullary nail with trans trochanteric and distal locking screw fixation of proximal femur fracture. Persistent displacement of the lesser trochanteric fracture fragment. Recent postsurgical change includes air and edema in the soft tissues. Overlying skin staples in place. The bones are subjectively under mineralized. IMPRESSION: ORIF of proximal femur fracture. Electronically Signed   By: Andrea Gasman M.D.   On: 06/05/2024 12:01   DG C-Arm 1-60 Min-No Report Result Date: 06/05/2024 Fluoroscopy was utilized by the requesting physician.  No radiographic interpretation.    Scheduled Meds:  acetaminophen   1,000 mg Oral Q6H   acyclovir ointment   Topical 5 X Daily   apixaban   5 mg Oral BID   carvedilol  3.125 mg Oral BID WC   Chlorhexidine  Gluconate Cloth  6 each Topical  Daily   gabapentin   200 mg Oral QHS   rOPINIRole   1 mg Oral QHS   rosuvastatin  5 mg Oral QHS   sodium chloride  flush  10-40 mL Intracatheter Q12H   traMADol   50 mg Oral Q6H   Continuous Infusions:  norepinephrine (LEVOPHED) Adult infusion Stopped (06/06/24 0737)     LOS: 5 days   Critical Care Procedure Note Authorized and Performed by: KYM Louder MD  Total Critical Care time:  53 mins Due to a high probability of clinically significant, life threatening deterioration, the patient required my highest level of preparedness to intervene emergently and I personally spent this critical care time directly and personally managing the patient.  This critical care time included obtaining a history; examining the patient, pulse oximetry; ordering and review of studies; arranging urgent treatment with development of a management plan; evaluation of patient's response of treatment; frequent reassessment; and discussions with other providers.  This critical care time was performed to assess and manage the high probability of imminent and life threatening deterioration that could result in multi-organ failure.  It was exclusive of separately billable procedures and treating other patients and teaching time.    Afton Louder, MD How to contact the [facility] Attending or Consulting provider 7A - 7P or covering provider during after hours 7P -7A, for this patient?  Check the care team in [facility] and look for a) attending/consulting [facility] provider listed and b) the [facility] team listed Log into [email or web address] to find provider on call.  Locate the [facility] provider you are looking for under [facility] and page to a number that you can be directly reached. If you still have difficulty reaching the provider, please page the [facility] (Director on Call) for the Hospitalists listed on amion for assistance.  06/06/2024, 2:59 PM

## 2024-06-06 NOTE — Progress Notes (Signed)
 Subjective: 1 Day Post-Op Procedure(s) (LRB): FIXATION, FRACTURE, INTERTROCHANTERIC, WITH INTRAMEDULLARY ROD (Right) Patient reports pain as MINIMAL WHEN AT REST .    Objective: Vital signs in last 24 hours: Temp:  [97.4 F (36.3 C)-97.8 F (36.6 C)] 97.5 F (36.4 C) (11/15 0737) Pulse Rate:  [47-120] 112 (11/15 1030) Resp:  [13-28] 20 (11/15 1045) BP: (50-160)/(24-97) 95/66 (11/15 1045) SpO2:  [86 %-100 %] 95 % (11/15 1045) Weight:  [66.8 kg] 66.8 kg (11/15 0418)  Intake/Output from previous day: 11/14 0701 - 11/15 0700 In: 2290 [P.O.:720; I.V.:1214.9; IV Piggyback:355.1] Out: 2210 [Urine:2060; Blood:150] Intake/Output this shift: Total I/O In: 240 [P.O.:240] Out: -   Recent Labs    06/03/24 1232 06/04/24 0428 06/05/24 0308 06/06/24 0328  HGB 8.5* 10.7* 11.6* 9.7*   Recent Labs    06/05/24 0308 06/06/24 0328  WBC 12.2* 8.3  RBC 3.84* 3.19*  HCT 34.6* 29.5*  PLT 220 196   Recent Labs    06/05/24 0308 06/06/24 0328  NA 133* 133*  K 4.3 4.3  CL 99 100  CO2 25 26  BUN 15 15  CREATININE 0.96 0.97  GLUCOSE 139* 116*  CALCIUM 8.0* 7.5*   No results for input(s): LABPT, INR in the last 72 hours.  Neurologically intact Neurovascular intact Sensation intact distally Intact pulses distally Dorsiflexion/Plantar flexion intact Incision: MILD DRAINAGE    Assessment/Plan: 1 Day Post-Op Procedure(s) (LRB): FIXATION, FRACTURE, INTERTROCHANTERIC, WITH INTRAMEDULLARY ROD (Right) Advance diet Up with therapy  PRESSORS TITRATED OFF-MONITOR BP START MOBILITY PROTOCOL    Pam [name] 06/06/2024, 11:13 AM

## 2024-06-07 DIAGNOSIS — N179 Acute kidney failure, unspecified: Secondary | ICD-10-CM | POA: Diagnosis not present

## 2024-06-07 DIAGNOSIS — R339 Retention of urine, unspecified: Secondary | ICD-10-CM

## 2024-06-07 DIAGNOSIS — D62 Acute posthemorrhagic anemia: Secondary | ICD-10-CM

## 2024-06-07 DIAGNOSIS — S72001A Fracture of unspecified part of neck of right femur, initial encounter for closed fracture: Secondary | ICD-10-CM | POA: Diagnosis not present

## 2024-06-07 DIAGNOSIS — R578 Other shock: Secondary | ICD-10-CM

## 2024-06-07 DIAGNOSIS — E871 Hypo-osmolality and hyponatremia: Secondary | ICD-10-CM

## 2024-06-07 DIAGNOSIS — I4819 Other persistent atrial fibrillation: Secondary | ICD-10-CM

## 2024-06-07 DIAGNOSIS — I5033 Acute on chronic diastolic (congestive) heart failure: Secondary | ICD-10-CM

## 2024-06-07 DIAGNOSIS — S72141A Displaced intertrochanteric fracture of right femur, initial encounter for closed fracture: Secondary | ICD-10-CM | POA: Diagnosis not present

## 2024-06-07 DIAGNOSIS — N189 Chronic kidney disease, unspecified: Secondary | ICD-10-CM

## 2024-06-07 LAB — BASIC METABOLIC PANEL WITH GFR
Anion gap: 7 (ref 5–15)
BUN: 26 mg/dL — ABNORMAL HIGH (ref 8–23)
CO2: 25 mmol/L (ref 22–32)
Calcium: 7.5 mg/dL — ABNORMAL LOW (ref 8.9–10.3)
Chloride: 94 mmol/L — ABNORMAL LOW (ref 98–111)
Creatinine, Ser: 1.54 mg/dL — ABNORMAL HIGH (ref 0.44–1.00)
GFR, Estimated: 34 mL/min — ABNORMAL LOW (ref >60)
Glucose, Bld: 99 mg/dL (ref 70–99)
Potassium: 4.4 mmol/L (ref 3.5–5.1)
Sodium: 127 mmol/L — ABNORMAL LOW (ref 135–145)

## 2024-06-07 LAB — CBC
HCT: 25.5 % — ABNORMAL LOW (ref 36.0–46.0)
Hemoglobin: 8.7 g/dL — ABNORMAL LOW (ref 12.0–15.0)
MCH: 31.5 pg (ref 26.0–34.0)
MCHC: 34.1 g/dL (ref 30.0–36.0)
MCV: 92.4 fL (ref 80.0–100.0)
Platelets: 173 K/uL (ref 150–400)
RBC: 2.76 MIL/uL — ABNORMAL LOW (ref 3.87–5.11)
RDW: 13.5 % (ref 11.5–15.5)
WBC: 8 K/uL (ref 4.0–10.5)
nRBC: 0 % (ref 0.0–0.2)

## 2024-06-07 MED ORDER — LACTATED RINGERS IV SOLN: INTRAVENOUS | Status: AC

## 2024-06-07 MED ORDER — APIXABAN 2.5 MG PO TABS
2.5000 mg | ORAL_TABLET | Freq: Two times a day (BID) | ORAL | Status: DC
  Administered 2024-06-07 – 2024-06-09 (×4): 2.5 mg via ORAL
  Filled 2024-06-07 (×4): qty 1

## 2024-06-07 MED ORDER — FUROSEMIDE 10 MG/ML IJ SOLN
40.0000 mg | Freq: Two times a day (BID) | INTRAMUSCULAR | Status: DC
  Administered 2024-06-07 – 2024-06-09 (×4): 40 mg via INTRAVENOUS
  Filled 2024-06-07 (×4): qty 4

## 2024-06-07 MED ORDER — UREA 15 G PO PACK
15.0000 g | PACK | Freq: Two times a day (BID) | ORAL | Status: DC
  Administered 2024-06-07 – 2024-06-09 (×5): 15 g via ORAL
  Filled 2024-06-07 (×8): qty 1

## 2024-06-07 NOTE — Plan of Care (Signed)
  Problem: Education: Goal: Knowledge of General Education information will improve Description: Including pain rating scale, medication(s)/side effects and non-pharmacologic comfort measures Outcome: Progressing   Problem: Health Behavior/Discharge Planning: Goal: Ability to manage health-related needs will improve Outcome: Progressing   Problem: Clinical Measurements: Goal: Respiratory complications will improve Outcome: Progressing Goal: Cardiovascular complication will be avoided Outcome: Progressing   Problem: Nutrition: Goal: Adequate nutrition will be maintained Outcome: Progressing   Problem: Coping: Goal: Level of anxiety will decrease Outcome: Progressing   

## 2024-06-07 NOTE — Plan of Care (Signed)
  Problem: Acute Rehab PT Goals(only PT should resolve) Goal: Pt Will Go Supine/Side To Sit Outcome: Progressing Flowsheets (Taken 06/07/2024 0946) Pt will go Supine/Side to Sit: with minimal assist Goal: Patient Will Transfer Sit To/From Stand Outcome: Progressing Flowsheets (Taken 06/07/2024 0946) Patient will transfer sit to/from stand: with minimal assist Goal: Pt Will Transfer Bed To Chair/Chair To Bed Outcome: Progressing Flowsheets (Taken 06/07/2024 0946) Pt will Transfer Bed to Chair/Chair to Bed: with min assist Goal: Pt Will Ambulate Outcome: Progressing Flowsheets (Taken 06/07/2024 0946) Pt will Ambulate:  15 feet  with moderate assist  with rolling walker   Lacinda Fass, PT, DPT

## 2024-06-07 NOTE — Evaluation (Addendum)
 Physical Therapy Evaluation Patient Details Name: Pam [name] MRN: [id number] DOB: 1940-10-16 Today's Date: 06/07/2024  History of Present Illness  Pam [name] is a 83 y.o. female with medical history significant for permanent atrial fibrillation on Eliquis , hypertension, chronic HFpEF, CKD 3B, who presents to the ER after a mechanical fall at home.  States she was doing well prior to this fall.  She tripped on the rug in her home, fell and landed on her right hip.  This was her first fall.     In the ER, right hip x-ray shows acute impacted intertrochanteric fracture of the right femur.  EDP discussed the case with orthopedic surgery Dr. Margrette.  He is planning to take the patient to the OR on Wednesday.  Home Eliquis  held.  The patient received multiple rounds of IV opiate analgesics and 1 L IV fluid bolus.  Admitted by [facility], hospitalist service.   Clinical Impression  Patient was agreeable to completing today's PT evaluation. She required modA will all bed mobility and transfers with increased difficulty utilizing her RLE. She exhibited slow and labored movement with all functional mobility. She also exhibited increased difficulty weight shifting onto her RLE. She experienced inconsistent SpO2 readings throughout today's transfers and mobility ranging from 79-94%. However, she was asymptomatic and reported no change in symptoms. These numbers stabilized above 88% once seated in chair. She was left in the chair with feet elevated, call bell within reach, and nursing notified. Patient will benefit from continued skilled physical therapy in hospital and recommended venue below to increase strength, balance, endurance for safe ADLs and gait.         If plan is discharge home, recommend the following: A lot of help with walking and/or transfers;A lot of help with bathing/dressing/bathroom;Assistance with cooking/housework;Assist for transportation;Help with stairs or ramp for entrance   Can  travel by private vehicle   No    Equipment Recommendations None recommended by PT  Recommendations for Other Services       Functional Status Assessment Patient has had a recent decline in their functional status and demonstrates the ability to make significant improvements in function in a reasonable and predictable amount of time.     Precautions / Restrictions Precautions Precautions: Fall Recall of Precautions/Restrictions: Intact Restrictions Weight Bearing Restrictions Per Provider Order: Yes RLE Weight Bearing Per Provider Order: Weight bearing as tolerated      Mobility  Bed Mobility Overal bed mobility: Needs Assistance Bed Mobility: Supine to Sit     Supine to sit: HOB elevated, Mod assist     General bed mobility comments: ModA for RLE mobility    Transfers Overall transfer level: Needs assistance Equipment used: Rolling walker (2 wheels) Transfers: Sit to/from Stand, Bed to chair/wheelchair/BSC Sit to Stand: Mod assist   Step pivot transfers: Mod assist       General transfer comment: slow and labored movement    Ambulation/Gait Ambulation/Gait assistance: Mod assist Gait Distance (Feet): 2 Feet Assistive device: Rolling walker (2 wheels) Gait Pattern/deviations: Step-to pattern, Decreased step length - left, Decreased stance time - right, Decreased stride length, Decreased weight shift to right Gait velocity: slow     General Gait Details: slow and labored movement with significant difficulty WB through RLE  Stairs            Wheelchair Mobility     Tilt Bed    Modified Rankin (Stroke Patients Only)       Balance Overall balance assessment: Needs assistance  Sitting-balance support: Bilateral upper extremity supported, Feet supported Sitting balance-Leahy Scale: Poor Sitting balance - Comments: seated EOB   Standing balance support: Bilateral upper extremity supported, During functional activity, Reliant on assistive device  for balance Standing balance-Leahy Scale: Zero Standing balance comment: with RW                             Pertinent Vitals/Pain Pain Assessment Pain Assessment: 0-10 Pain Score: 7  Pain Location: right hip Pain Descriptors / Indicators: Other (Comment) (hurting) Pain Intervention(s): Limited activity within patient's tolerance, Repositioned, Monitored during session, Ice applied    Home Living Family/patient expects to be discharged to:: Private residence Living Arrangements: Spouse/significant other Available Help at Discharge: Family;Available 24 hours/day Type of Home: House Home Access: Stairs to enter Entrance Stairs-Rails: Right;Left;Can reach both Entrance Stairs-Number of Steps: 3-4   Home Layout: One level Home Equipment: Agricultural [employer]ant (2 wheels);Cane - quad;Cane - single point;Shower seat;Toilet riser;Grab bars - toilet;Grab bars - tub/shower      Prior Function Prior Level of Function : Independent/Modified Independent;Driving;History of Falls (last six months)             Mobility Comments: Pt was independent with all mobility; drives; 1 fall in past 6 months ADLs Comments: Pt was independent with all ADL's and IADLs     Extremity/Trunk Assessment        Lower Extremity Assessment Lower Extremity Assessment: Generalized weakness    Cervical / Trunk Assessment Cervical / Trunk Assessment: Normal  Communication   Communication Communication: No apparent difficulties    Cognition Arousal: Alert Behavior During Therapy: WFL for tasks assessed/performed   PT - Cognitive impairments: No apparent impairments                         Following commands: Intact       Cueing Cueing Techniques: Verbal cues, Gestural cues, Tactile cues     General Comments      Exercises     Assessment/Plan    PT Assessment Patient needs continued PT services  PT Problem List Decreased strength;Decreased range of motion;Decreased  activity tolerance;Decreased balance;Decreased mobility;Pain       PT Treatment Interventions DME instruction;Gait training;Stair training;Functional mobility training;Therapeutic activities;Therapeutic exercise;Balance training;Patient/family education;Neuromuscular re-education    PT Goals (Current goals can be found in the Care Plan section)  Acute Rehab PT Goals Patient Stated Goal: Pt would like to go to [facility] for rehab after being discharged from the hospital. PT Goal Formulation: With patient Time For Goal Achievement: 06/14/24 Potential to Achieve Goals: Good    Frequency Min 3X/week     Co-evaluation               AM-PAC PT 6 Clicks Mobility  Outcome Measure Help needed turning from your back to your side while in a flat bed without using bedrails?: A Lot Help needed moving from lying on your back to sitting on the side of a flat bed without using bedrails?: A Lot Help needed moving to and from a bed to a chair (including a wheelchair)?: A Lot Help needed standing up from a chair using your arms (e.g., wheelchair or bedside chair)?: A Lot Help needed to walk in hospital room?: Total Help needed climbing 3-5 steps with a railing? : Total 6 Click Score: 10    End of Session Equipment Utilized During Treatment: Gait belt Activity Tolerance: Patient tolerated treatment  well Patient left: in chair;with call bell/phone within reach Nurse Communication: Weight bearing status;Mobility status PT Visit Diagnosis: Muscle weakness (generalized) (M62.81);Unsteadiness on feet (R26.81);Difficulty in walking, not elsewhere classified (R26.2)    Time: 9163-9081 PT Time Calculation (min) (ACUTE ONLY): 42 min   Charges:   PT Evaluation $PT Eval Moderate Complexity: 1 Mod PT Treatments $Therapeutic Activity: 38-52 mins PT General Charges $$ ACUTE PT VISIT: 1 Visit         Lacinda Fass, PT, DPT   Lacinda JAYSON Fass 06/07/2024, 9:45 AM

## 2024-06-07 NOTE — Hospital Course (Signed)
 83 y.o. female with medical history significant for permanent atrial fibrillation on Eliquis , hypertension, chronic HFpEF, CKD 3B, who presents to the ER after a mechanical fall at home.    Assessment and Plan:   Acute right intertrochanteric femur fracture - Secondary to ground-level fall.  S/p ORIF 11/14 by orthopedic surgery.  PT/OT on board.  Likely transitioning to SNF.  Eliquis  on board.  Pain medication as needed.   Acute hemorrhagic shock - Initially requiring vasopressor medications.  S/p 3 units PRBCs 11/12.  Vasopressors subsequently weaned off.  Blood pressure stable.     Acute blood loss anemia - Exacerbated by surgery.  S/p 3 units PRBCs 11/2.  Continues to show slow downtrend.  Hemoglobin down to 8.2 this morning.  No active bleeding appreciated.  Will recheck CBC in a.m. transfuse if less than 7.   Acute kidney injury on CKD 3B - Initially resolved however showed worsening creatinine yesterday, up to 1.54.  Now improved to 1.26, however BUN elevated.  Responded well to gentle IV fluids.  Monitor urine output via Foley catheter.  Recheck BMP in AM.   Hyponatremia - Likely low solute intake, possible mild volume overload.  Adding urea  powder.  Sodium 127 this morning.  Recheck BMP in AM.   Permanent atrial fibrillation - Back on Eliquis .  Carvedilol reduced dose initiated 3.125 mg twice daily.  Hold if systolic blood pressure less than 90.  Monitor response.   Chronic HFpEF - Appears euvolemic however labs suggest possibly overloaded (hyponatremia, worsening creatinine, downtrending hemoglobin).  Continue trial of IV Lasix  40 mg twice daily and monitor response.   Chronic urinary retention (POA) - Patient states that she self caths at home.  Currently has Foley catheter in place.  Will leave Foley in place for now.   Goals of care - Patient no longer on vasopressor medications.  Will transition out of ICU

## 2024-06-07 NOTE — [place] FL2 (Signed)
 [place]  MEDICAID FL2 LEVEL OF CARE FORM     IDENTIFICATION  Patient Name: Pam [name] Birthdate: 03-04-1941 Sex: female Admission Date (Current Location): 06/01/2024  [facility] and [place] Number:  [employer] and Address:  [facility],  [address] [place] [address]      Provider Number: [id number]  Attending Physician Name and Address:  Arlon Carliss ORN, DO  Relative Name and Phone Number:  Buffin,Fetzer (Spouse)  [phone]    Current Level of Care: Hospital Recommended Level of Care: Skilled Nursing Facility Prior Approval Number:    Date Approved/Denied:   PASRR Number: [id number]  Discharge Plan: SNF    Current Diagnoses: Patient Active Problem List   Diagnosis Date Noted   Femur fracture, right (HCC) 06/01/2024   Dilated cardiomyopathy (HCC) 05/07/2024   Dyslipidemia 10/22/2022   Secondary hypercoagulable state 02/26/2022   Cardiomegaly 02/26/2022   Leg swelling 03/19/2021   SOB (shortness of breath) 03/19/2021   Chronic atrial fibrillation (HCC) 08/16/2020   Abnormal CT of the abdomen 07/06/2020   Hx of total knee arthroplasty, right 04/07/2020   Diarrhea 12/30/2019   Acute abdominal pain 12/14/2019   Acute lower UTI 12/14/2019   Colitis 12/14/2019   S/P total knee arthroplasty, 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   Educated about COVID-19 virus infection 07/27/2019   Persistent atrial fibrillation (HCC) 06/07/2019   Atrial fibrillation (HCC)    Candidiasis, vagina 01/02/2019   Hernia of abdominal cavity 10/07/2015   Overflow incontinence of urine 07/04/2015   Urinary retention with incomplete bladder emptying 05/09/2015   Cervical cancer (HCC) 03/23/2015    Orientation RESPIRATION BLADDER Height & Weight     Self, Time, Situation, Place  Normal Continent Weight: 67.4 kg Height:  5' 3 (160 cm)  BEHAVIORAL SYMPTOMS/MOOD NEUROLOGICAL BOWEL NUTRITION STATUS      Continent Diet (See DC summary)  AMBULATORY STATUS COMMUNICATION OF  NEEDS Skin   Extensive Assist Verbally Surgical wounds                       Personal Care Assistance Level of Assistance  Bathing, Feeding, Dressing Bathing Assistance: Limited assistance Feeding assistance: Independent Dressing Assistance: Limited assistance     Functional Limitations Info  Sight, Hearing, Speech Sight Info: Adequate Hearing Info: Adequate Speech Info: Adequate    SPECIAL CARE FACTORS FREQUENCY  PT (By licensed PT)     PT Frequency: 5 times a week              Contractures Contractures Info: Not present    Additional Factors Info  Code Status, Allergies               Current Medications (06/07/2024):  This is the current hospital active medication list Current Facility-Administered Medications  Medication Dose Route Frequency Provider Last Rate Last Admin   acetaminophen  (TYLENOL ) tablet 1,000 mg  1,000 mg Oral Q6H Margrette Taft BRAVO, MD   1,000 mg at 06/07/24 9392   acetaminophen  (TYLENOL ) tablet 500 mg  500 mg Oral Q6H PRN Hall, Carole N, DO   500 mg at 06/03/24 1016   acyclovir ointment (ZOVIRAX) 5 %   Topical 5 X Daily Vicci Afton CROME, MD   Given at 06/07/24 9096   apixaban  (ELIQUIS ) tablet 2.5 mg  2.5 mg Oral BID Tanda Dempsey SAUNDERS, RPH       carvedilol (COREG) tablet 3.125 mg  3.125 mg Oral BID WC Johnson, Clanford L, MD   3.125 mg at 06/07/24 [phone]  Chlorhexidine  Gluconate Cloth 2 % PADS 6 each  6 each Topical Daily Rashid, Farhan, MD   6 each at 06/07/24 0841   gabapentin  (NEURONTIN ) capsule 200 mg  200 mg Oral QHS Shona Laurence N, DO   200 mg at 06/06/24 2119   lactated ringers  infusion   Intravenous Continuous Arlon Carliss ORN, DO 75 mL/hr at 06/07/24 0928 New Bag at 06/07/24 0928   melatonin tablet 6 mg  6 mg Oral QHS PRN Hall, Carole N, DO   6 mg at 06/06/24 2120   norepinephrine (LEVOPHED) 4mg  in (0.016 mg/mL) premix infusion  0-10 mcg/min Intravenous Titrated Dino Antu, MD   Stopped at 06/06/24 0737   polyethylene  glycol (MIRALAX / GLYCOLAX) packet 17 g  17 g Oral Daily PRN Shona Laurence N, DO   17 g at 06/02/24 1600   prochlorperazine  (COMPAZINE ) injection 5 mg  5 mg Intravenous Q6H PRN Shona Laurence N, DO       rOPINIRole  (REQUIP ) tablet 1 mg  1 mg Oral QHS Hall, Carole N, DO   1 mg at 06/06/24 2120   rosuvastatin (CRESTOR) tablet 5 mg  5 mg Oral QHS Shona Laurence N, DO   5 mg at 06/06/24 2120   sodium chloride  flush (NS) 0.9 % injection 10-40 mL  10-40 mL Intracatheter Q12H Dino Antu, MD   20 mL at 06/07/24 0841   sodium chloride  flush (NS) 0.9 % injection 10-40 mL  10-40 mL Intracatheter PRN Rashid, Farhan, MD       traMADol  (ULTRAM ) tablet 50 mg  50 mg Oral Q6H Harrison, Stanley E, MD   50 mg at 06/07/24 9392   urea  (URE-NA) oral packet 15 g  15 g Oral BID Arlon Carliss ORN, DO   15 g at 06/07/24 9096     Discharge Medications: Please see discharge summary for a list of discharge medications.  Relevant Imaging Results:  Relevant Lab Results:   Additional Information SS# [id number]  Sharlyne Stabs, RN

## 2024-06-07 NOTE — Progress Notes (Signed)
 Progress Note   Patient: Pam [name] FMW:[id number] DOB: 1941-04-27 DOA: 06/01/2024  DOS: the patient was seen and examined on 06/07/2024   Brief hospital course:  83 y.o. female with medical history significant for permanent atrial fibrillation on Eliquis , hypertension, chronic HFpEF, CKD 3B, who presents to the ER after a mechanical fall at home.   Assessment and Plan:  Acute right intertrochanteric femur fracture - Secondary to ground-level fall.  S/p ORIF 11/14 by orthopedic surgery.  PT/OT on board.  Likely transitioning to SNF.  Eliquis  on board.  Pain medication as needed.  Acute hemorrhagic shock - Initially requiring vasopressor medications.  S/p 3 units PRBCs 11/12.  Vasopressors subsequently weaned off.  Blood pressure stable.    Acute blood loss anemia - Exacerbated by surgery.  S/p 3 units PRBCs 11/2.  Continues to show downtrend.  No active bleeding appreciated.  Will recheck CBC in a.m.  Acute kidney injury on CKD 3B - Initially resolved however showed worsening creatinine this morning, up to 1.54.  Patient stating she has not had much to eat or drink.  Will begin gentle IV fluids.  Monitor urine output via Foley catheter.  Recheck BMP in AM.  Hyponatremia - Likely low solute intake.  IV fluids initiated as above.  Will also initiate urea  powder.  Recheck BMP in AM.  Permanent atrial fibrillation - Back on Eliquis .  Carvedilol reduced dose initiated 3.125 mg twice daily.  Monitor response.  Chronic HFpEF - Appears euvolemic however labs suggest possibly overloaded (hyponatremia, worsening creatinine, downtrending hemoglobin).  Will give trial of IV Lasix  40 mg and monitor response.  Chronic urinary retention (POA) - Patient states that she self caths at home.  Currently has Foley catheter in place.  Will leave Foley in place for now.  Goals of care - Patient no longer on vasopressor medications.  Will transition out of ICU   Subjective: Patient sitting up  at the bedside this morning, in good spirits.  States she feels well about to work with physical therapy.  Denies any worsening shortness of breath, fever, chills, chest pain, nausea, vomiting, abdominal pain.  Has pain in her hip which is to be expected.  Motivated to work with PT.  Physical Exam:  Vitals:   06/07/24 0456 06/07/24 0500 06/07/24 0742 06/07/24 0800  BP:  (!) 100/20  (!) 113/31  Pulse:    86  Resp:  11  17  Temp:   97.6 F (36.4 C)   TempSrc:   Oral   SpO2:    100%  Weight: 67.4 kg     Height:        GENERAL:  Alert, pleasant, no acute distress  HEENT:  EOMI CARDIOVASCULAR: Irregularly irregular RESPIRATORY:  Clear to auscultation, no wheezing, rales, or rhonchi GASTROINTESTINAL:  Soft, nontender, nondistended EXTREMITIES:  No LE edema bilaterally NEURO:  No new focal deficits appreciated SKIN: Surgical bandage over right hip  PSYCH:  Appropriate mood and affect     Data Reviewed:  Imaging Studies: DG FEMUR, MIN 2 VIEWS RIGHT Result Date: 06/05/2024 CLINICAL DATA:  Pain, fracture. EXAM: RIGHT FEMUR 2 VIEWS COMPARISON:  Preoperative imaging FINDINGS: Ten fluoroscopic spot views of the right proximal femur submitted from the operating room. Femoral intramedullary nail with trans trochanteric and distal locking screw fixation of proximal femur fracture. Fluoroscopy time 3 minutes 54 seconds. Dose 72.01 mGy. IMPRESSION: Intraoperative fluoroscopy during ORIF of proximal femur fracture. Electronically Signed   By: Andrea Gasman M.D.   On:  06/05/2024 12:01   DG HIP UNILAT WITH PELVIS 2-3 VIEWS RIGHT Result Date: 06/05/2024 CLINICAL DATA:  Hip fracture. EXAM: DG HIP (WITH OR WITHOUT PELVIS) 2-3V*R* COMPARISON:  Preoperative imaging FINDINGS: Femoral intramedullary nail with trans trochanteric and distal locking screw fixation of proximal femur fracture. Persistent displacement of the lesser trochanteric fracture fragment. Recent postsurgical change includes air and  edema in the soft tissues. Overlying skin staples in place. The bones are subjectively under mineralized. IMPRESSION: ORIF of proximal femur fracture. Electronically Signed   By: Andrea Gasman M.D.   On: 06/05/2024 12:01   DG C-Arm 1-60 Min-No Report Result Date: 06/05/2024 Fluoroscopy was utilized by the requesting physician.  No radiographic interpretation.   DG CHEST PORT 1 VIEW Result Date: 06/03/2024 CLINICAL DATA:  PICC placement. EXAM: PORTABLE CHEST 1 VIEW COMPARISON:  Chest radiograph dated 06/01/2024 FINDINGS: Right-sided PICC with tip over central SVC. No focal consolidation, pleural effusion or pneumothorax. Stable cardiomegaly. Atherosclerotic calcification of the aorta. No acute osseous pathology. Left axillary surgical clips. IMPRESSION: Right-sided PICC with tip over central SVC. Electronically Signed   By: Vanetta Chou M.D.   On: 06/03/2024 18:07   CT HIP RIGHT WO CONTRAST Result Date: 06/03/2024 EXAM: CT OF THE RIGHT HIP WITHOUT IV CONTRAST 06/03/2024 02:59:00 PM TECHNIQUE: CT of the right hip was performed without the administration of intravenous contrast. Multiplanar reformatted images are provided for review. Automated exposure control, iterative reconstruction, and/or weight based adjustment of the mA/kV was utilized to reduce the radiation dose to as low as reasonably achievable. COMPARISON: Radiographs 06/01/2024. CLINICAL HISTORY: Hip trauma, fracture suspected, xray done; worsening swelling of right thigh, dropping Hb, suspect hematoma/hemorrhage. FINDINGS: BONES: Comminuted right hip fracture includes a basocervical femoral neck component as well as an intertrochanteric fracture component extending from the anterior aspect of the greater trochanter through the lesser trochanter. Varus and apex anterior angulation observed between the proximal and intermediary fragments with the degree of varus angulation and tilt of the proximal fragment resulting in some impingement  between the lateral acetabulum and the femoral neck with indistinctness of the femoral cortex at the junction of the femoral head and neck in this region, for example on image 74 series 5, favoring additional lateral subcapital fracturing. Mild spurring of the right femoral head. Spurring at the pubis also noted. No aggressive appearing osseous abnormality or periostitis. SOFT TISSUE: Edema and tracts along the iliacus muscle with some expansion of the iliacus muscle compared to the prior CT pelvis from 12/14/2019 suspicious for intermuscular hematoma although not considered large enough in volume to substantially drop the hemoglobin level. Lateral subcutaneous edema along the hip likely indicates bruising. Small amount of fluid in the right trochanteric bursa is complex and may reflect hematoma but again is not of a volume to substantially drop the hemoglobin level. Chronic atrophy of the right gluteus medius and right gluteus minimus muscles. Low level edema tracks along the right hip adductor musculature. Small right groin hernia contains adipose tissue. Clips are noted just above this groin hernia. No soft tissue mass. JOINT: The comminuted right hip fracture involves significant disruption of the joint, including a basocervical femoral neck component and an intertrochanteric fracture component. Varus and apex anterior angulation between the proximal and intermediary fragments results in impingement between the lateral acetabulum and the femoral neck, with indistinctness of the femoral cortex at the junction of the femoral head and neck in this region, favoring additional lateral subcapital fracturing. Mild spurring of the right femoral head is  present. INTRAPELVIC CONTENTS: Foley catheter in the otherwise empty urinary bladder. Iliac and right common femoral artery atheromatous vascular calcification. Trace free pelvic fluid, etiology uncertain. IMPRESSION: 1. Comminuted right hip fracture with basocervical  femoral neck and intertrochanteric components, with varus and apex anterior angulation, and possible additional lateral subcapital fracture component due to cortical indistinctness at the lateral femoral headneck junction. 2. Edema tracks along the iliacus muscle which is mildly expanded, suspicious for intermuscular hematoma, but not considered large enough to substantially drop hemoglobin level. 3. Small amount of complex fluid in the right trochanteric bursa, possibly hematoma, not of a volume to substantially drop hemoglobin level. 4. Degenerative changes with mild spurring of the right femoral head. 5. Degenerative changes with spurring at the pubis. Electronically signed by: Ryan Salvage MD 06/03/2024 04:13 PM EST RP Workstation: [id number]   US  EKG SITE RITE Result Date: 06/03/2024 If Site Rite image not attached, placement could not be confirmed due to current cardiac rhythm.  ECHOCARDIOGRAM COMPLETE Result Date: 06/02/2024    ECHOCARDIOGRAM REPORT   Patient Name:   MONICA CODD Date of Exam: 06/02/2024 Medical Rec #:  [id number]       Height:       63.0 in Accession #:    [id number]      Weight:       135.0 lb Date of Birth:  Dec 23, 1940      BSA:          1.636 m Patient Age:    82 years        BP:           100/54 mmHg Patient Gender: F               HR:           78 bpm. Exam Location:  [facility] Procedure: 2D Echo, 3D Echo, Cardiac Doppler, Color Doppler and Strain Analysis            (Both Spectral and Color Flow Doppler were utilized during            procedure). Indications:    CHF  History:        Patient has prior history of Echocardiogram examinations, most                 recent 01/21/2023. Cardiomyopathy, Arrythmias:Atrial Fibrillation,                 Signs/Symptoms:Shortness of Breath; Risk Factors:Dyslipidemia.  Sonographer:    Philomena Daring Referring Phys: [id number] QJMYJW RASHID  Sonographer Comments: Global longitudinal strain was attempted. IMPRESSIONS  1. Left ventricular  ejection fraction, by estimation, is 60 to 65%. Left ventricular ejection fraction by 3D volume is 55 %. The left ventricle has normal function. The left ventricle has no regional wall motion abnormalities. Left ventricular diastolic  function could not be evaluated. The average left ventricular global longitudinal strain is -12.4 %.  2. Right ventricular systolic function is normal. The right ventricular size is normal. There is normal pulmonary artery systolic pressure.  3. Left atrial size was mildly dilated.  4. The mitral valve is normal in structure. Trivial mitral valve regurgitation. No evidence of mitral stenosis.  5. The aortic valve is tricuspid. Aortic valve regurgitation is trivial. No aortic stenosis is present.  6. The inferior vena cava is normal in size with greater than 50% respiratory variability, suggesting right atrial pressure of 3 mmHg.  7. Evidence of atrial level shunting detected by color flow Doppler.  There is a patent foramen ovale with bidirectional shunting across atrial septum. Comparison(s): No significant change from prior study. FINDINGS  Left Ventricle: Left ventricular ejection fraction, by estimation, is 60 to 65%. Left ventricular ejection fraction by 3D volume is 55 %. The left ventricle has normal function. The left ventricle has no regional wall motion abnormalities. The average left ventricular global longitudinal strain is -12.4 %. Strain was performed and the global longitudinal strain is indeterminate. The left ventricular internal cavity size was normal in size. There is no left ventricular hypertrophy. Left ventricular diastolic function could not be evaluated due to atrial fibrillation. Left ventricular diastolic function could not be evaluated. Right Ventricle: The right ventricular size is normal. No increase in right ventricular wall thickness. Right ventricular systolic function is normal. There is normal pulmonary artery systolic pressure. The tricuspid  regurgitant velocity is 2.71 m/s, and  with an assumed right atrial pressure of 3 mmHg, the estimated right ventricular systolic pressure is 32.4 mmHg. Left Atrium: Left atrial size was mildly dilated. Right Atrium: Right atrial size was normal in size. Pericardium: There is no evidence of pericardial effusion. Mitral Valve: The mitral valve is normal in structure. Trivial mitral valve regurgitation. No evidence of mitral valve stenosis. Tricuspid Valve: The tricuspid valve is normal in structure. Tricuspid valve regurgitation is mild . No evidence of tricuspid stenosis. Aortic Valve: The aortic valve is tricuspid. Aortic valve regurgitation is trivial. No aortic stenosis is present. Pulmonic Valve: The pulmonic valve was normal in structure. Pulmonic valve regurgitation is trivial. No evidence of pulmonic stenosis. Aorta: The aortic root and ascending aorta are structurally normal, with no evidence of dilitation. Venous: The inferior vena cava is normal in size with greater than 50% respiratory variability, suggesting right atrial pressure of 3 mmHg. IAS/Shunts: Evidence of atrial level shunting detected by color flow Doppler. Additional Comments: 3D was performed not requiring image post processing on an independent workstation and was normal.  LEFT VENTRICLE PLAX 2D LVIDd:         3.50 cm         Diastology LVIDs:         2.60 cm         LV e' medial:    9.14 cm/s LV PW:         0.90 cm         LV E/e' medial:  11.8 LV IVS:        0.90 cm         LV e' lateral:   12.50 cm/s LVOT diam:     2.00 cm         LV E/e' lateral: 8.6 LV SV:         49 LV SV Index:   30              2D Longitudinal LVOT Area:     3.14 cm        Strain                                2D Strain GLS   -12.4 %                                Avg:  3D Volume EF                                LV 3D EF:    Left                                             ventricul                                             ar                                              ejection                                             fraction                                             by 3D                                             volume is                                             55 %.                                 3D Volume EF:                                3D EF:        55 %                                LV EDV:       73 ml                                LV ESV:       33 ml                                LV SV:        40 ml RIGHT VENTRICLE             IVC RV Basal diam:  3.60 cm     IVC diam: 1.70 cm RV Mid diam:    2.90 cm RV S  prime:     10.10 cm/s TAPSE (M-mode): 1.7 cm LEFT ATRIUM             Index        RIGHT ATRIUM           Index LA diam:        3.40 cm 2.08 cm/m   RA Area:     17.60 cm LA Vol (A2C):   69.9 ml 42.72 ml/m  RA Volume:   43.20 ml  26.40 ml/m LA Vol (A4C):   49.0 ml 29.95 ml/m LA Biplane Vol: 58.4 ml 35.69 ml/m  AORTIC VALVE LVOT Vmax:   96.10 cm/s LVOT Vmean:  62.400 cm/s LVOT VTI:    0.157 m  AORTA Ao Root diam: 2.80 cm Ao Asc diam:  3.00 cm MITRAL VALVE                TRICUSPID VALVE MV Area (PHT): 4.17 cm     TR Peak grad:   29.4 mmHg MV Decel Time: 182 msec     TR Vmax:        271.00 cm/s MV E velocity: 108.00 cm/s                             SHUNTS                             Systemic VTI:  0.16 m                             Systemic Diam: 2.00 cm Vishnu Priya Mallipeddi Electronically signed by Diannah Late Mallipeddi Signature Date/Time: 06/02/2024/3:08:10 PM    Final    CT Head Wo Contrast Result Date: 06/01/2024 EXAM: CT HEAD WITHOUT CONTRAST 06/01/2024 09:00:33 PM TECHNIQUE: CT of the head was performed without the administration of intravenous contrast. Automated exposure control, iterative reconstruction, and/or weight based adjustment of the mA/kV was utilized to reduce the radiation dose to as low as reasonably achievable. COMPARISON: None available. CLINICAL HISTORY: Head trauma, moderate-severe.  FINDINGS: BRAIN AND VENTRICLES: No acute hemorrhage. No evidence of acute infarct. No hydrocephalus. No extra-axial collection. No mass effect or midline shift. Mild periventricular white matter disease. Age-related volume loss. Mild vascular calcifications. ORBITS: No acute abnormality. Status post cataract surgery. SINUSES: No acute abnormality. SOFT TISSUES AND SKULL: No acute soft tissue abnormality. No skull fracture. IMPRESSION: 1. No acute intracranial abnormality. Electronically signed by: Pinkie Pebbles MD 06/01/2024 09:03 PM EST RP Workstation: [id number]   DG Chest 1 View Result Date: 06/01/2024 EXAM: 1 VIEW(S) XRAY OF THE CHEST 06/01/2024 07:15:00 PM COMPARISON: 04/11/2024 CLINICAL HISTORY: chest pain FINDINGS: LUNGS AND PLEURA: Low lung volumes with bronchovascular crowding. No pleural effusion. No pneumothorax. HEART AND MEDIASTINUM: Aortic tortuosity with atherosclerosis. No cardiomegaly. BONES AND SOFT TISSUES: Surgical clips left axilla. No acute osseous abnormality. JOINTS: Bilateral shoulder osteoarthritis. DISCS/DEGENERATIVE CHANGES: Multilevel degenerative disc disease of the spine. IMPRESSION: 1. Lung volumes. No acute cardiopulmonary abnormality. Electronically signed by: Rogelia Myers MD 06/01/2024 07:25 PM EST RP Workstation: [id number]   DG Hip Unilat With Pelvis 2-3 Views Right Result Date: 06/01/2024 EXAM: 2 OR MORE VIEW(S) XRAY OF THE RIGHT HIP 06/01/2024 07:13:00 PM COMPARISON: None available. CLINICAL HISTORY: fall FINDINGS: BONES AND JOINTS: Acute impacted intertrochanteric fracture of the right femur with varus angulation. Mild bilateral hip degenerative changes. SOFT TISSUES:  The soft tissues are unremarkable. IMPRESSION: 1. Acute impacted intertrochanteric fracture of the right femur. Electronically signed by: Pinkie Pebbles MD 06/01/2024 07:18 PM EST RP Workstation: [id number]    There are no new results to review at this time.  Previous records (including but  not limited to H&P, progress notes, nursing notes, TOC management) were reviewed in assessment of this patient.  Labs: CBC: Recent Labs  Lab 06/01/24 1853 06/02/24 0446 06/03/24 0441 06/03/24 1232 06/04/24 0428 06/05/24 0308 06/06/24 0328 06/07/24 0332  WBC 12.1* 9.9   < > 8.4 12.7* 12.2* 8.3 8.0  NEUTROABS 10.0* 7.8*  --   --   --   --   --   --   HGB 11.8* 10.6*   < > 8.5* 10.7* 11.6* 9.7* 8.7*  HCT 34.9* 31.3*   < > 27.0* 31.8* 34.6* 29.5* 25.5*  MCV 89.5 88.9   < > 95.4 90.1 90.1 92.5 92.4  PLT 304 248   < > 171 183 220 196 173   < > = values in this interval not displayed.   Basic Metabolic Panel: Recent Labs  Lab 06/02/24 0446 06/03/24 1232 06/04/24 0428 06/05/24 0308 06/06/24 0328 06/07/24 0332  NA 127* 132* 134* 133* 133* 127*  K 4.7 4.4 3.8 4.3 4.3 4.4  CL 90* 101 101 99 100 94*  CO2 27 22 21* 25 26 25   GLUCOSE 108* 94 188* 139* 116* 99  BUN 42* 26* 17 15 15  26*  CREATININE 1.79* 1.41* 1.23* 0.96 0.97 1.54*  CALCIUM 8.5* 7.5* 7.1* 8.0* 7.5* 7.5*  MG 2.2 2.4  --   --   --   --   PHOS 4.9*  --   --  1.7* 3.3  --    Liver Function Tests: Recent Labs  Lab 06/05/24 0308  AST 15  ALT 10  ALKPHOS 64  BILITOT 0.7  PROT 5.4*  ALBUMIN 3.4*   CBG: No results for input(s): GLUCAP in the last 168 hours.  Scheduled Meds:  acetaminophen   1,000 mg Oral Q6H   acyclovir ointment   Topical 5 X Daily   apixaban   2.5 mg Oral BID   carvedilol  3.125 mg Oral BID WC   Chlorhexidine  Gluconate Cloth  6 each Topical Daily   gabapentin   200 mg Oral QHS   rOPINIRole   1 mg Oral QHS   rosuvastatin  5 mg Oral QHS   sodium chloride  flush  10-40 mL Intracatheter Q12H   traMADol   50 mg Oral Q6H   urea   15 g Oral BID   Continuous Infusions:  lactated ringers  75 mL/hr at 06/07/24 1221   norepinephrine (LEVOPHED) Adult infusion Stopped (06/06/24 0737)   PRN Meds:.acetaminophen , melatonin, polyethylene glycol, prochlorperazine , sodium chloride  flush  Family  Communication: None at bedside  Disposition: Status is: Inpatient Remains inpatient appropriate because: Hip fracture, anemia     Time spent: 52 minutes  Length of inpatient stay: 6 days  Author: Carliss LELON Canales, DO 06/07/2024 1:16 PM  For on call review [email or web address].

## 2024-06-07 NOTE — TOC Initial Note (Signed)
 Transition of Care [facility]) - Initial/Assessment Note    Patient Details  Name: Pam [name] MRN: [id number] Date of Birth: [birth year]  Transition of Care [facility]) CM/SW Contact:    Sharlyne Stabs, RN Phone Number: 06/07/2024, 11:04 AM  Clinical Narrative:     PT is recommending SNF. CM at the bedside, patient want to go to [facility]. FL2 will be sent out and starting INS Auth. Patient is medically ready for rehab. IPCM following.               Expected Discharge Plan: Skilled Nursing Facility Barriers to Discharge: Continued Medical Work up, SNF Pending bed offer   Patient Goals and CMS Choice Patient states their goals for this hospitalization and ongoing recovery are:: agreeable to SNF CMS Medicare.gov Compare Post Acute Care list provided to:: Patient Choice offered to / list presented to : Patient [place] ownership interest in [facility].provided to:: Patient    Expected Discharge Plan and Services     Post Acute Care Choice: Durable Medical Equipment Living arrangements for the past 2 months: Single Family Home                     Prior Living Arrangements/Services Living arrangements for the past 2 months: Single Family Home Lives with:: Self Patient language and need for interpreter reviewed:: Yes Do you feel safe going back to the place where you live?: Yes      Need for Family Participation in Patient Care: No (Comment) Care giver support system in place?: No (comment) Current home services: DME Criminal Activity/Legal Involvement Pertinent to Current Situation/Hospitalization: No - Comment as needed  Activities of Daily Living   ADL Screening (condition at time of admission) Independently performs ADLs?: Yes (appropriate for developmental age) Is the patient deaf or have difficulty hearing?: No Does the patient have difficulty seeing, even when wearing glasses/contacts?: No Does the patient have difficulty concentrating, remembering, or making  decisions?: No  Permission Sought/Granted      Share Information with NAME: Hawraa     Permission granted to share info w Relationship: Patient     Emotional Assessment Appearance:: Appears stated age Attitude/Demeanor/Rapport: Engaged Affect (typically observed): Accepting Orientation: : Oriented to Self, Oriented to Place, Oriented to  Time, Oriented to Situation Alcohol / Substance Use: Not Applicable Psych Involvement: No (comment)  Admission diagnosis:  Femur fracture, right (HCC) [S72.91XA] Closed displaced intertrochanteric fracture of right femur, initial encounter (HCC) [S72.141A] Patient Active Problem List   Diagnosis Date Noted   Femur fracture, right (HCC) 06/01/2024   Dilated cardiomyopathy (HCC) 05/07/2024   Dyslipidemia 10/22/2022   Secondary hypercoagulable state 02/26/2022   Cardiomegaly 02/26/2022   Leg swelling 03/19/2021   SOB (shortness of breath) 03/19/2021   Chronic atrial fibrillation (HCC) 08/16/2020   Abnormal CT of the abdomen 07/06/2020   Hx of total knee arthroplasty, 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/2020   Diarrhea 12/30/2019   Acute abdominal pain 12/14/2019   Acute lower UTI 12/14/2019   Colitis 12/14/2019   S/P total knee arthroplasty, 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   Educated about COVID-19 virus infection 07/27/2019   Persistent atrial fibrillation (HCC) 06/07/2019   Atrial fibrillation (HCC)    Candidiasis, vagina 01/02/2019   Hernia of abdominal cavity 10/07/2015   Overflow incontinence of urine 07/04/2015   Urinary retention with incomplete bladder emptying 05/09/2015   Cervical cancer (HCC) 03/23/2015   PCP:  Orpha Yancey LABOR, MD Pharmacy:   [facility] [address] [place]  [address] [place] [address] Phone: [phone] Fax: [phone]    Social Drivers of Health (SDOH) Social History: SDOH Screenings   Food Insecurity: No Food Insecurity (06/02/2024)  Housing: Unknown (06/02/2024)  Transportation Needs: No  Transportation Needs (06/02/2024)  Utilities: Not At Risk (06/02/2024)  Social Connections: Socially Isolated (06/02/2024)  Tobacco Use: Medium Risk (06/05/2024)   SDOH Interventions:   Readmission Risk Interventions    06/03/2024   11:25 AM 06/02/2024   11:38 AM  Readmission Risk Prevention Plan  Transportation Screening Complete Complete  HRI or Home Care Consult Complete Complete  Social Work Consult for Recovery Care Planning/Counseling Complete Complete  Palliative Care Screening Not Applicable Not Applicable  Medication Review Oceanographer) Complete Complete

## 2024-06-07 NOTE — Plan of Care (Signed)

## 2024-06-08 DIAGNOSIS — N179 Acute kidney failure, unspecified: Secondary | ICD-10-CM | POA: Diagnosis not present

## 2024-06-08 DIAGNOSIS — I5033 Acute on chronic diastolic (congestive) heart failure: Secondary | ICD-10-CM | POA: Diagnosis not present

## 2024-06-08 DIAGNOSIS — S72001A Fracture of unspecified part of neck of right femur, initial encounter for closed fracture: Secondary | ICD-10-CM | POA: Diagnosis not present

## 2024-06-08 DIAGNOSIS — D62 Acute posthemorrhagic anemia: Secondary | ICD-10-CM | POA: Diagnosis not present

## 2024-06-08 LAB — CBC
HCT: 24.4 % — ABNORMAL LOW (ref 36.0–46.0)
Hemoglobin: 8.2 g/dL — ABNORMAL LOW (ref 12.0–15.0)
MCH: 30.9 pg (ref 26.0–34.0)
MCHC: 33.6 g/dL (ref 30.0–36.0)
MCV: 92.1 fL (ref 80.0–100.0)
Platelets: 206 K/uL (ref 150–400)
RBC: 2.65 MIL/uL — ABNORMAL LOW (ref 3.87–5.11)
RDW: 13.4 % (ref 11.5–15.5)
WBC: 7.9 K/uL (ref 4.0–10.5)
nRBC: 0 % (ref 0.0–0.2)

## 2024-06-08 LAB — COMPREHENSIVE METABOLIC PANEL WITH GFR
ALT: 13 U/L (ref 0–44)
AST: 32 U/L (ref 15–41)
Albumin: 2.7 g/dL — ABNORMAL LOW (ref 3.5–5.0)
Alkaline Phosphatase: 87 U/L (ref 38–126)
Anion gap: 6 (ref 5–15)
BUN: 53 mg/dL — ABNORMAL HIGH (ref 8–23)
CO2: 27 mmol/L (ref 22–32)
Calcium: 7.5 mg/dL — ABNORMAL LOW (ref 8.9–10.3)
Chloride: 94 mmol/L — ABNORMAL LOW (ref 98–111)
Creatinine, Ser: 1.26 mg/dL — ABNORMAL HIGH (ref 0.44–1.00)
GFR, Estimated: 43 mL/min — ABNORMAL LOW (ref >60)
Glucose, Bld: 111 mg/dL — ABNORMAL HIGH (ref 70–99)
Potassium: 4.5 mmol/L (ref 3.5–5.1)
Sodium: 127 mmol/L — ABNORMAL LOW (ref 135–145)
Total Bilirubin: 0.8 mg/dL (ref 0.0–1.2)
Total Protein: 4.6 g/dL — ABNORMAL LOW (ref 6.5–8.1)

## 2024-06-08 LAB — BASIC METABOLIC PANEL WITH GFR
Anion gap: 7 (ref 5–15)
BUN: 63 mg/dL — ABNORMAL HIGH (ref 8–23)
CO2: 28 mmol/L (ref 22–32)
Calcium: 7.7 mg/dL — ABNORMAL LOW (ref 8.9–10.3)
Chloride: 94 mmol/L — ABNORMAL LOW (ref 98–111)
Creatinine, Ser: 1.14 mg/dL — ABNORMAL HIGH (ref 0.44–1.00)
GFR, Estimated: 48 mL/min — ABNORMAL LOW (ref >60)
Glucose, Bld: 157 mg/dL — ABNORMAL HIGH (ref 70–99)
Potassium: 4.3 mmol/L (ref 3.5–5.1)
Sodium: 129 mmol/L — ABNORMAL LOW (ref 135–145)

## 2024-06-08 LAB — MAGNESIUM: Magnesium: 2.6 mg/dL — ABNORMAL HIGH (ref 1.7–2.4)

## 2024-06-08 MED ORDER — BISACODYL 10 MG RE SUPP
10.0000 mg | Freq: Every day | RECTAL | Status: DC | PRN
Start: 2024-06-08 — End: 2024-06-09

## 2024-06-08 MED ORDER — BISACODYL 5 MG PO TBEC: 5.0000 mg | DELAYED_RELEASE_TABLET | Freq: Every day | ORAL | Status: DC | PRN

## 2024-06-08 NOTE — Plan of Care (Signed)

## 2024-06-08 NOTE — Care Management Important Message (Signed)
 Important Message  Patient Details  Name: Pam [name] MRN: [id number] Date of Birth: 09/15/1940   Important Message Given:  Yes - Medicare IM     [name] [name] 06/08/2024, 2:01 PM

## 2024-06-08 NOTE — TOC Progression Note (Addendum)
 Transition of Care [facility]) - Progression Note    Patient Details  Name: NAHIA NISSAN MRN: [id number] Date of Birth: 09-03-1940  Transition of Care [facility]) CM/SW Contact  Mcarth[place] Saddie Kim, [place] Phone Number: 06/08/2024, 7:53 AM  Clinical Narrative:   Pt accepts bed at [facility]. Facility notified. Insurance updated- pending auth.   Update: Auth received. Anticipate d/c tomorrow. [place] updated.    Expected Discharge Plan: Skilled Nursing Facility Barriers to Discharge: Continued Medical Work up, SNF Pending bed offer               Expected Discharge Plan and Services     Post Acute Care Choice: Durable Medical Equipment Living arrangements for the past 2 months: Single Family Home                                       Social Drivers of Health (SDOH) Interventions SDOH Screenings   Food Insecurity: No Food Insecurity (06/02/2024)  Housing: Unknown (06/02/2024)  Transportation Needs: No Transportation Needs (06/02/2024)  Utilities: Not At Risk (06/02/2024)  Social Connections: Socially Isolated (06/02/2024)  Tobacco Use: Medium Risk (06/05/2024)    Readmission Risk Interventions    06/03/2024   11:25 AM 06/02/2024   11:38 AM  Readmission Risk Prevention Plan  Transportation Screening Complete Complete  HRI or Home Care Consult Complete Complete  Social Work Consult for Recovery Care Planning/Counseling Complete Complete  Palliative Care Screening Not Applicable Not Applicable  Medication Review Oceanographer) Complete Complete

## 2024-06-08 NOTE — Progress Notes (Signed)
 Progress Note   Patient: Pam [name] FMW:[id number] DOB: May 15, 1941 DOA: 06/01/2024  DOS: the patient was seen and examined on 06/08/2024   Brief hospital course:  83 y.o. female with medical history significant for permanent atrial fibrillation on Eliquis , hypertension, chronic HFpEF, CKD 3B, who presents to the ER after a mechanical fall at home.    Assessment and Plan:   Acute right intertrochanteric femur fracture - Secondary to ground-level fall.  S/p ORIF 11/14 by orthopedic surgery.  PT/OT on board.  Likely transitioning to SNF.  Eliquis  on board.  Pain medication as needed.   Acute hemorrhagic shock - Initially requiring vasopressor medications.  S/p 3 units PRBCs 11/12.  Vasopressors subsequently weaned off.  Blood pressure stable.     Acute blood loss anemia - Exacerbated by surgery.  S/p 3 units PRBCs 11/2.  Continues to show slow downtrend.  Hemoglobin down to 8.2 this morning.  No active bleeding appreciated.  Will recheck CBC in a.m. transfuse if less than 7.   Acute kidney injury on CKD 3B - Initially resolved however showed worsening creatinine yesterday, up to 1.54.  Now improved to 1.26, however BUN elevated.  Responded well to gentle IV fluids.  Monitor urine output via Foley catheter.  Recheck BMP in AM.   Hyponatremia - Likely low solute intake, possible mild volume overload.  Adding urea  powder.  Sodium 127 this morning.  Recheck BMP in AM.   Permanent atrial fibrillation - Back on Eliquis .  Carvedilol reduced dose initiated 3.125 mg twice daily.  Hold if systolic blood pressure less than 90.  Monitor response.   Chronic HFpEF - Appears euvolemic however labs suggest possibly overloaded (hyponatremia, worsening creatinine, downtrending hemoglobin).  Continue trial of IV Lasix  40 mg twice daily and monitor response.   Chronic urinary retention (POA) - Patient states that she self caths at home.  Currently has Foley catheter in place.  Will leave Foley in  place for now.   Goals of care - Patient no longer on vasopressor medications.  Will transition out of ICU   Subjective: Patient resting in bed, states she feels well.  Denies any dizziness, fever, chills, chest pain, nausea, vomiting, abdominal pain.  Pain in her right hip is to be expected.  Was able to work with physical therapy yesterday.  Motivated to be transition to SNF.  Physical Exam:  Vitals:   06/07/24 2216 06/07/24 2337 06/08/24 0450 06/08/24 0500  BP: (!) 108/56 96/72 117/66   Pulse:  90 (!) 103   Resp: 16 17 18    Temp: 98.3 F (36.8 C) 98.8 F (37.1 C) 97.6 F (36.4 C)   TempSrc: Axillary Axillary Oral   SpO2: 93% 90% (!) 87%   Weight:    71.5 kg  Height:        GENERAL:  Alert, pleasant, no acute distress  HEENT:  EOMI CARDIOVASCULAR: Irregularly irregular RESPIRATORY:  Clear to auscultation, no wheezing, rales, or rhonchi GASTROINTESTINAL:  Soft, nontender, nondistended EXTREMITIES:  No LE edema bilaterally NEURO:  No new focal deficits appreciated SKIN: Surgical bandage over right hip  PSYCH:  Appropriate mood and affect    Data Reviewed:  Imaging Studies: DG FEMUR, MIN 2 VIEWS RIGHT Result Date: 06/05/2024 CLINICAL DATA:  Pain, fracture. EXAM: RIGHT FEMUR 2 VIEWS COMPARISON:  Preoperative imaging FINDINGS: Ten fluoroscopic spot views of the right proximal femur submitted from the operating room. Femoral intramedullary nail with trans trochanteric and distal locking screw fixation of proximal femur fracture. Fluoroscopy time  3 minutes 54 seconds. Dose 72.01 mGy. IMPRESSION: Intraoperative fluoroscopy during ORIF of proximal femur fracture. Electronically Signed   By: Andrea Gasman M.D.   On: 06/05/2024 12:01   DG HIP UNILAT WITH PELVIS 2-3 VIEWS RIGHT Result Date: 06/05/2024 CLINICAL DATA:  Hip fracture. EXAM: DG HIP (WITH OR WITHOUT PELVIS) 2-3V*R* COMPARISON:  Preoperative imaging FINDINGS: Femoral intramedullary nail with trans trochanteric and  distal locking screw fixation of proximal femur fracture. Persistent displacement of the lesser trochanteric fracture fragment. Recent postsurgical change includes air and edema in the soft tissues. Overlying skin staples in place. The bones are subjectively under mineralized. IMPRESSION: ORIF of proximal femur fracture. Electronically Signed   By: Andrea Gasman M.D.   On: 06/05/2024 12:01   DG C-Arm 1-60 Min-No Report Result Date: 06/05/2024 Fluoroscopy was utilized by the requesting physician.  No radiographic interpretation.   DG CHEST PORT 1 VIEW Result Date: 06/03/2024 CLINICAL DATA:  PICC placement. EXAM: PORTABLE CHEST 1 VIEW COMPARISON:  Chest radiograph dated 06/01/2024 FINDINGS: Right-sided PICC with tip over central SVC. No focal consolidation, pleural effusion or pneumothorax. Stable cardiomegaly. Atherosclerotic calcification of the aorta. No acute osseous pathology. Left axillary surgical clips. IMPRESSION: Right-sided PICC with tip over central SVC. Electronically Signed   By: Vanetta Chou M.D.   On: 06/03/2024 18:07   CT HIP RIGHT WO CONTRAST Result Date: 06/03/2024 EXAM: CT OF THE RIGHT HIP WITHOUT IV CONTRAST 06/03/2024 02:59:00 PM TECHNIQUE: CT of the right hip was performed without the administration of intravenous contrast. Multiplanar reformatted images are provided for review. Automated exposure control, iterative reconstruction, and/or weight based adjustment of the mA/kV was utilized to reduce the radiation dose to as low as reasonably achievable. COMPARISON: Radiographs 06/01/2024. CLINICAL HISTORY: Hip trauma, fracture suspected, xray done; worsening swelling of right thigh, dropping Hb, suspect hematoma/hemorrhage. FINDINGS: BONES: Comminuted right hip fracture includes a basocervical femoral neck component as well as an intertrochanteric fracture component extending from the anterior aspect of the greater trochanter through the lesser trochanter. Varus and apex  anterior angulation observed between the proximal and intermediary fragments with the degree of varus angulation and tilt of the proximal fragment resulting in some impingement between the lateral acetabulum and the femoral neck with indistinctness of the femoral cortex at the junction of the femoral head and neck in this region, for example on image 74 series 5, favoring additional lateral subcapital fracturing. Mild spurring of the right femoral head. Spurring at the pubis also noted. No aggressive appearing osseous abnormality or periostitis. SOFT TISSUE: Edema and tracts along the iliacus muscle with some expansion of the iliacus muscle compared to the prior CT pelvis from 12/14/2019 suspicious for intermuscular hematoma although not considered large enough in volume to substantially drop the hemoglobin level. Lateral subcutaneous edema along the hip likely indicates bruising. Small amount of fluid in the right trochanteric bursa is complex and may reflect hematoma but again is not of a volume to substantially drop the hemoglobin level. Chronic atrophy of the right gluteus medius and right gluteus minimus muscles. Low level edema tracks along the right hip adductor musculature. Small right groin hernia contains adipose tissue. Clips are noted just above this groin hernia. No soft tissue mass. JOINT: The comminuted right hip fracture involves significant disruption of the joint, including a basocervical femoral neck component and an intertrochanteric fracture component. Varus and apex anterior angulation between the proximal and intermediary fragments results in impingement between the lateral acetabulum and the femoral neck, with indistinctness of  the femoral cortex at the junction of the femoral head and neck in this region, favoring additional lateral subcapital fracturing. Mild spurring of the right femoral head is present. INTRAPELVIC CONTENTS: Foley catheter in the otherwise empty urinary bladder. Iliac and  right common femoral artery atheromatous vascular calcification. Trace free pelvic fluid, etiology uncertain. IMPRESSION: 1. Comminuted right hip fracture with basocervical femoral neck and intertrochanteric components, with varus and apex anterior angulation, and possible additional lateral subcapital fracture component due to cortical indistinctness at the lateral femoral headneck junction. 2. Edema tracks along the iliacus muscle which is mildly expanded, suspicious for intermuscular hematoma, but not considered large enough to substantially drop hemoglobin level. 3. Small amount of complex fluid in the right trochanteric bursa, possibly hematoma, not of a volume to substantially drop hemoglobin level. 4. Degenerative changes with mild spurring of the right femoral head. 5. Degenerative changes with spurring at the pubis. Electronically signed by: Ryan Salvage MD 06/03/2024 04:13 PM EST RP Workstation: [id number]   US  EKG SITE RITE Result Date: 06/03/2024 If Site Rite image not attached, placement could not be confirmed due to current cardiac rhythm.  ECHOCARDIOGRAM COMPLETE Result Date: 06/02/2024    ECHOCARDIOGRAM REPORT   Patient Name:   LETTIE CZARNECKI Date of Exam: 06/02/2024 Medical Rec #:  [id number]       Height:       63.0 in Accession #:    [id number]      Weight:       135.0 lb Date of Birth:  1941/02/28      BSA:          1.636 m Patient Age:    82 years        BP:           100/54 mmHg Patient Gender: F               HR:           78 bpm. Exam Location:  [facility] Procedure: 2D Echo, 3D Echo, Cardiac Doppler, Color Doppler and Strain Analysis            (Both Spectral and Color Flow Doppler were utilized during            procedure). Indications:    CHF  History:        Patient has prior history of Echocardiogram examinations, most                 recent 01/21/2023. Cardiomyopathy, Arrythmias:Atrial Fibrillation,                 Signs/Symptoms:Shortness of Breath; Risk  Factors:Dyslipidemia.  Sonographer:    Philomena Daring Referring Phys: [id number] QJMYJW RASHID  Sonographer Comments: Global longitudinal strain was attempted. IMPRESSIONS  1. Left ventricular ejection fraction, by estimation, is 60 to 65%. Left ventricular ejection fraction by 3D volume is 55 %. The left ventricle has normal function. The left ventricle has no regional wall motion abnormalities. Left ventricular diastolic  function could not be evaluated. The average left ventricular global longitudinal strain is -12.4 %.  2. Right ventricular systolic function is normal. The right ventricular size is normal. There is normal pulmonary artery systolic pressure.  3. Left atrial size was mildly dilated.  4. The mitral valve is normal in structure. Trivial mitral valve regurgitation. No evidence of mitral stenosis.  5. The aortic valve is tricuspid. Aortic valve regurgitation is trivial. No aortic stenosis is present.  6. The inferior vena cava is  normal in size with greater than 50% respiratory variability, suggesting right atrial pressure of 3 mmHg.  7. Evidence of atrial level shunting detected by color flow Doppler. There is a patent foramen ovale with bidirectional shunting across atrial septum. Comparison(s): No significant change from prior study. FINDINGS  Left Ventricle: Left ventricular ejection fraction, by estimation, is 60 to 65%. Left ventricular ejection fraction by 3D volume is 55 %. The left ventricle has normal function. The left ventricle has no regional wall motion abnormalities. The average left ventricular global longitudinal strain is -12.4 %. Strain was performed and the global longitudinal strain is indeterminate. The left ventricular internal cavity size was normal in size. There is no left ventricular hypertrophy. Left ventricular diastolic function could not be evaluated due to atrial fibrillation. Left ventricular diastolic function could not be evaluated. Right Ventricle: The right ventricular  size is normal. No increase in right ventricular wall thickness. Right ventricular systolic function is normal. There is normal pulmonary artery systolic pressure. The tricuspid regurgitant velocity is 2.71 m/s, and  with an assumed right atrial pressure of 3 mmHg, the estimated right ventricular systolic pressure is 32.4 mmHg. Left Atrium: Left atrial size was mildly dilated. Right Atrium: Right atrial size was normal in size. Pericardium: There is no evidence of pericardial effusion. Mitral Valve: The mitral valve is normal in structure. Trivial mitral valve regurgitation. No evidence of mitral valve stenosis. Tricuspid Valve: The tricuspid valve is normal in structure. Tricuspid valve regurgitation is mild . No evidence of tricuspid stenosis. Aortic Valve: The aortic valve is tricuspid. Aortic valve regurgitation is trivial. No aortic stenosis is present. Pulmonic Valve: The pulmonic valve was normal in structure. Pulmonic valve regurgitation is trivial. No evidence of pulmonic stenosis. Aorta: The aortic root and ascending aorta are structurally normal, with no evidence of dilitation. Venous: The inferior vena cava is normal in size with greater than 50% respiratory variability, suggesting right atrial pressure of 3 mmHg. IAS/Shunts: Evidence of atrial level shunting detected by color flow Doppler. Additional Comments: 3D was performed not requiring image post processing on an independent workstation and was normal.  LEFT VENTRICLE PLAX 2D LVIDd:         3.50 cm         Diastology LVIDs:         2.60 cm         LV e' medial:    9.14 cm/s LV PW:         0.90 cm         LV E/e' medial:  11.8 LV IVS:        0.90 cm         LV e' lateral:   12.50 cm/s LVOT diam:     2.00 cm         LV E/e' lateral: 8.6 LV SV:         49 LV SV Index:   30              2D Longitudinal LVOT Area:     3.14 cm        Strain                                2D Strain GLS   -12.4 %  Avg:                                  3D Volume EF                                LV 3D EF:    Left                                             ventricul                                             ar                                             ejection                                             fraction                                             by 3D                                             volume is                                             55 %.                                 3D Volume EF:                                3D EF:        55 %                                LV EDV:       73 ml                                LV ESV:       33 ml                                LV SV:        40 ml RIGHT VENTRICLE  IVC RV Basal diam:  3.60 cm     IVC diam: 1.70 cm RV Mid diam:    2.90 cm RV S prime:     10.10 cm/s TAPSE (M-mode): 1.7 cm LEFT ATRIUM             Index        RIGHT ATRIUM           Index LA diam:        3.40 cm 2.08 cm/m   RA Area:     17.60 cm LA Vol (A2C):   69.9 ml 42.72 ml/m  RA Volume:   43.20 ml  26.40 ml/m LA Vol (A4C):   49.0 ml 29.95 ml/m LA Biplane Vol: 58.4 ml 35.69 ml/m  AORTIC VALVE LVOT Vmax:   96.10 cm/s LVOT Vmean:  62.400 cm/s LVOT VTI:    0.157 m  AORTA Ao Root diam: 2.80 cm Ao Asc diam:  3.00 cm MITRAL VALVE                TRICUSPID VALVE MV Area (PHT): 4.17 cm     TR Peak grad:   29.4 mmHg MV Decel Time: 182 msec     TR Vmax:        271.00 cm/s MV E velocity: 108.00 cm/s                             SHUNTS                             Systemic VTI:  0.16 m                             Systemic Diam: 2.00 cm Vishnu Priya Mallipeddi Electronically signed by Diannah Late Mallipeddi Signature Date/Time: 06/02/2024/3:08:10 PM    Final    CT Head Wo Contrast Result Date: 06/01/2024 EXAM: CT HEAD WITHOUT CONTRAST 06/01/2024 09:00:33 PM TECHNIQUE: CT of the head was performed without the administration of intravenous contrast. Automated exposure control, iterative reconstruction, and/or weight based  adjustment of the mA/kV was utilized to reduce the radiation dose to as low as reasonably achievable. COMPARISON: None available. CLINICAL HISTORY: Head trauma, moderate-severe. FINDINGS: BRAIN AND VENTRICLES: No acute hemorrhage. No evidence of acute infarct. No hydrocephalus. No extra-axial collection. No mass effect or midline shift. Mild periventricular white matter disease. Age-related volume loss. Mild vascular calcifications. ORBITS: No acute abnormality. Status post cataract surgery. SINUSES: No acute abnormality. SOFT TISSUES AND SKULL: No acute soft tissue abnormality. No skull fracture. IMPRESSION: 1. No acute intracranial abnormality. Electronically signed by: Pinkie Pebbles MD 06/01/2024 09:03 PM EST RP Workstation: [id number]   DG Chest 1 View Result Date: 06/01/2024 EXAM: 1 VIEW(S) XRAY OF THE CHEST 06/01/2024 07:15:00 PM COMPARISON: 04/11/2024 CLINICAL HISTORY: chest pain FINDINGS: LUNGS AND PLEURA: Low lung volumes with bronchovascular crowding. No pleural effusion. No pneumothorax. HEART AND MEDIASTINUM: Aortic tortuosity with atherosclerosis. No cardiomegaly. BONES AND SOFT TISSUES: Surgical clips left axilla. No acute osseous abnormality. JOINTS: Bilateral shoulder osteoarthritis. DISCS/DEGENERATIVE CHANGES: Multilevel degenerative disc disease of the spine. IMPRESSION: 1. Lung volumes. No acute cardiopulmonary abnormality. Electronically signed by: Rogelia Myers MD 06/01/2024 07:25 PM EST RP Workstation: [id number]   DG Hip Unilat With Pelvis 2-3 Views Right Result Date: 06/01/2024 EXAM: 2 OR MORE VIEW(S) XRAY OF THE RIGHT HIP 06/01/2024 07:13:00 PM COMPARISON: None available.  CLINICAL HISTORY: fall FINDINGS: BONES AND JOINTS: Acute impacted intertrochanteric fracture of the right femur with varus angulation. Mild bilateral hip degenerative changes. SOFT TISSUES: The soft tissues are unremarkable. IMPRESSION: 1. Acute impacted intertrochanteric fracture of the right femur.  Electronically signed by: Pinkie Pebbles MD 06/01/2024 07:18 PM EST RP Workstation: [id number]    There are no new results to review at this time.  Previous records (including but not limited to H&P, progress notes, nursing notes, TOC management) were reviewed in assessment of this patient.  Labs: CBC: Recent Labs  Lab 06/01/24 1853 06/02/24 0446 06/03/24 0441 06/04/24 0428 06/05/24 0308 06/06/24 0328 06/07/24 0332 06/08/24 0447  WBC 12.1* 9.9   < > 12.7* 12.2* 8.3 8.0 7.9  NEUTROABS 10.0* 7.8*  --   --   --   --   --   --   HGB 11.8* 10.6*   < > 10.7* 11.6* 9.7* 8.7* 8.2*  HCT 34.9* 31.3*   < > 31.8* 34.6* 29.5* 25.5* 24.4*  MCV 89.5 88.9   < > 90.1 90.1 92.5 92.4 92.1  PLT 304 248   < > 183 220 196 173 206   < > = values in this interval not displayed.   Basic Metabolic Panel: Recent Labs  Lab 06/02/24 0446 06/03/24 1232 06/04/24 0428 06/05/24 0308 06/06/24 0328 06/07/24 0332 06/08/24 0447  NA 127* 132* 134* 133* 133* 127* 127*  K 4.7 4.4 3.8 4.3 4.3 4.4 4.5  CL 90* 101 101 99 100 94* 94*  CO2 27 22 21* 25 26 25 27   GLUCOSE 108* 94 188* 139* 116* 99 111*  BUN 42* 26* 17 15 15  26* 53*  CREATININE 1.79* 1.41* 1.23* 0.96 0.97 1.54* 1.26*  CALCIUM 8.5* 7.5* 7.1* 8.0* 7.5* 7.5* 7.5*  MG 2.2 2.4  --   --   --   --  2.6*  PHOS 4.9*  --   --  1.7* 3.3  --   --    Liver Function Tests: Recent Labs  Lab 06/05/24 0308 06/08/24 0447  AST 15 32  ALT 10 13  ALKPHOS 64 87  BILITOT 0.7 0.8  PROT 5.4* 4.6*  ALBUMIN 3.4* 2.7*   CBG: No results for input(s): GLUCAP in the last 168 hours.  Scheduled Meds:  acetaminophen   1,000 mg Oral Q6H   acyclovir ointment   Topical 5 X Daily   apixaban   2.5 mg Oral BID   carvedilol  3.125 mg Oral BID WC   Chlorhexidine  Gluconate Cloth  6 each Topical Daily   furosemide   40 mg Intravenous BID   gabapentin   200 mg Oral QHS   rOPINIRole   1 mg Oral QHS   rosuvastatin  5 mg Oral QHS   sodium chloride  flush  10-40 mL  Intracatheter Q12H   traMADol   50 mg Oral Q6H   urea   15 g Oral BID   Continuous Infusions: PRN Meds:.acetaminophen , melatonin, polyethylene glycol, prochlorperazine , sodium chloride  flush  Family Communication: None at bedside  Disposition: Status is: Inpatient Remains inpatient appropriate because: Right hip fracture     Time spent: 40 minutes  Length of inpatient stay: 7 days  Author: Carliss LELON Canales, DO 06/08/2024 12:42 PM  For on call review [email or web address].

## 2024-06-08 NOTE — Plan of Care (Signed)
  Problem: Education: Goal: Knowledge of General Education information will improve Description: Including pain rating scale, medication(s)/side effects and non-pharmacologic comfort measures Outcome: Progressing   Problem: Health Behavior/Discharge Planning: Goal: Ability to manage health-related needs will improve Outcome: Progressing   Problem: Clinical Measurements: Goal: Ability to maintain clinical measurements within normal limits will improve Outcome: Progressing   Problem: Activity: Goal: Risk for activity intolerance will decrease Outcome: Progressing   Problem: Nutrition: Goal: Adequate nutrition will be maintained Outcome: Progressing   Problem: Coping: Goal: Level of anxiety will decrease Outcome: Progressing   Problem: Elimination: Goal: Will not experience complications related to bowel motility Outcome: Progressing Goal: Will not experience complications related to urinary retention Outcome: Progressing

## 2024-06-09 DIAGNOSIS — D62 Acute posthemorrhagic anemia: Secondary | ICD-10-CM | POA: Diagnosis not present

## 2024-06-09 DIAGNOSIS — S72001A Fracture of unspecified part of neck of right femur, initial encounter for closed fracture: Secondary | ICD-10-CM | POA: Diagnosis not present

## 2024-06-09 DIAGNOSIS — I5033 Acute on chronic diastolic (congestive) heart failure: Secondary | ICD-10-CM | POA: Diagnosis not present

## 2024-06-09 DIAGNOSIS — N179 Acute kidney failure, unspecified: Secondary | ICD-10-CM | POA: Diagnosis not present

## 2024-06-09 LAB — MAGNESIUM: Magnesium: 2.4 mg/dL (ref 1.7–2.4)

## 2024-06-09 LAB — CBC
HCT: 25.3 % — ABNORMAL LOW (ref 36.0–46.0)
Hemoglobin: 8.3 g/dL — ABNORMAL LOW (ref 12.0–15.0)
MCH: 30.4 pg (ref 26.0–34.0)
MCHC: 32.8 g/dL (ref 30.0–36.0)
MCV: 92.7 fL (ref 80.0–100.0)
Platelets: 278 K/uL (ref 150–400)
RBC: 2.73 MIL/uL — ABNORMAL LOW (ref 3.87–5.11)
RDW: 13.3 % (ref 11.5–15.5)
WBC: 7.6 K/uL (ref 4.0–10.5)
nRBC: 0 % (ref 0.0–0.2)

## 2024-06-09 LAB — BASIC METABOLIC PANEL WITH GFR
Anion gap: 7 (ref 5–15)
BUN: 47 mg/dL — ABNORMAL HIGH (ref 8–23)
CO2: 31 mmol/L (ref 22–32)
Calcium: 8.1 mg/dL — ABNORMAL LOW (ref 8.9–10.3)
Chloride: 97 mmol/L — ABNORMAL LOW (ref 98–111)
Creatinine, Ser: 1.07 mg/dL — ABNORMAL HIGH (ref 0.44–1.00)
GFR, Estimated: 52 mL/min — ABNORMAL LOW (ref >60)
Glucose, Bld: 98 mg/dL (ref 70–99)
Potassium: 3.9 mmol/L (ref 3.5–5.1)
Sodium: 135 mmol/L (ref 135–145)

## 2024-06-09 MED ORDER — TRAMADOL HCL 50 MG PO TABS: 50.0000 mg | ORAL_TABLET | Freq: Two times a day (BID) | ORAL | 0 refills | Status: AC | PRN

## 2024-06-09 MED ORDER — APIXABAN 2.5 MG PO TABS: 2.5000 mg | ORAL_TABLET | Freq: Two times a day (BID) | ORAL | Status: AC

## 2024-06-09 NOTE — Discharge Summary (Signed)
 Physician Discharge Summary   Patient: Pam [name] MRN: [id number] DOB: 09-07-1940  Admit date:     06/01/2024  Discharge date: 06/09/24  Discharge Physician: Carliss LELON Canales   PCP: Orpha Yancey LABOR, MD   Recommendations at discharge:    Pt to be discharged to [facility].   If you experience worsening fever, chills, chest pain, shortness of breath, or other concerning symptoms, please call your PCP or go to the emergency department immediately.  Discharge Diagnoses: Principal Problem:   Closed right hip fracture, initial encounter [facility]) Active Problems:   Urinary retention with incomplete bladder emptying   Persistent atrial fibrillation (HCC)   Acute blood loss anemia   Hemorrhagic shock (HCC)   Acute on chronic heart failure with preserved ejection fraction (HFpEF) (HCC)   Acute kidney injury superimposed on chronic kidney disease   Hyponatremia  Resolved Problems:   * No resolved hospital problems. *   Hospital Course:  83 y.o. female with medical history significant for permanent atrial fibrillation on Eliquis , hypertension, chronic HFpEF, CKD 3B, who presents to the ER after a mechanical fall at home.    Assessment and Plan:   Acute right intertrochanteric femur fracture - Secondary to ground-level fall.  S/p ORIF 11/14 by orthopedic surgery.  PT/OT on board.  Likely transitioning to SNF.  Eliquis  on board x 28 days.  Pain medication as needed.  Follow-up with Ortho in the outpatient setting at 4 weeks.  Wound care dressing changes per orthopedic surgery.   Acute hemorrhagic shock - Initially requiring vasopressor medications.  S/p 3 units PRBCs 11/12.  Vasopressors subsequently weaned off.  Blood pressure stable.  Resolved.   Acute blood loss anemia - Exacerbated by surgery.  S/p 3 units PRBCs 11/2.  Hemoglobin showed slow downtrend but stable.  No active bleeding appreciated.     Acute kidney injury on CKD 3B - Resolved after IV fluids and diuresis.   Currently at baseline.   Hyponatremia - Likely low solute intake, possible mild volume overload.  Resolved after diuresis.  Sodium 135 this morning.    Permanent atrial fibrillation - Back on Eliquis .  Carvedilol discontinued secondary to hypotension.   Chronic HFpEF - Appears euvolemic however labs suggest possibly overloaded (hyponatremia, worsening creatinine, downtrending hemoglobin).  Responded well to IV Lasix  40 mg twice daily and monitor response.  Patient to resume her normal p.o. Lasix  regimen upon discharge.   Chronic urinary retention (POA) - Patient states that she self caths at home.  Currently has Foley catheter in place.  Will leave Foley in place for now.  Can go back to self cath once patient is strong enough.      Consultants: Orthopedic surgery Procedures performed: ORIF right intertrochanteric femur fracture Disposition: Skilled nursing facility Diet recommendation:  Discharge Diet Orders (From admission, onward)     Start     Ordered   06/09/24 0000  Diet - low sodium heart healthy        06/09/24 1033           Cardiac diet  DISCHARGE MEDICATION: Allergies as of 06/09/2024       Reactions   Aspirin Nausea And Vomiting   Ciprofloxacin  Diarrhea   Flagyl  [metronidazole ] Diarrhea   Penicillins Hives   20 years Tolerated Cephalosporin Date: 02/15/2020.        Medication List     STOP taking these medications    benazepril 5 MG tablet Commonly known as: LOTENSIN   carvedilol 6.25 MG tablet  Commonly known as: COREG   dapagliflozin  propanediol 10 MG Tabs tablet Commonly known as: Farxiga    spironolactone 25 MG tablet Commonly known as: ALDACTONE       TAKE these medications    apixaban  2.5 MG Tabs tablet Commonly known as: ELIQUIS  Take 1 tablet (2.5 mg total) by mouth 2 (two) times daily. What changed:  medication strength how much to take   fexofenadine 180 MG tablet Commonly known as: ALLEGRA Take 180 mg by mouth daily.    furosemide  40 MG tablet Commonly known as: LASIX  Take 1 tablet (40 mg total) by mouth 2 (two) times daily.   gabapentin  300 MG capsule Commonly known as: NEURONTIN  Take 300 mg by mouth at bedtime.   rOPINIRole  1 MG tablet Commonly known as: REQUIP  Take 1 mg by mouth at bedtime.   rosuvastatin 5 MG tablet Commonly known as: CRESTOR Take 5 mg by mouth at bedtime.   traMADol  50 MG tablet Commonly known as: ULTRAM  Take 1 tablet (50 mg total) by mouth every 12 (twelve) hours as needed (pain).               Discharge Care Instructions  (From admission, onward)           Start     Ordered   06/09/24 0000  Discharge wound care:       Comments: Per ortho   06/09/24 1033            Contact information for after-discharge care     Destination     [facility] .   Service: Skilled Nursing Contact information: [address] [facility] [place]  [address] [phone]                     Discharge Exam: Fredricka Weights   06/07/24 0456 06/08/24 0500 06/09/24 0500  Weight: 67.4 kg 71.5 kg 67.7 kg    GENERAL:  Alert, pleasant, no acute distress  HEENT:  EOMI CARDIOVASCULAR: Irregularly irregular RESPIRATORY:  Clear to auscultation, no wheezing, rales, or rhonchi GASTROINTESTINAL:  Soft, nontender, nondistended EXTREMITIES:  No LE edema bilaterally NEURO:  No new focal deficits appreciated SKIN: Surgical bandage over right hip  PSYCH:  Appropriate mood and affect    Condition at discharge: improving  The results of significant diagnostics from this hospitalization (including imaging, microbiology, ancillary and laboratory) are listed below for reference.   Imaging Studies: DG FEMUR, MIN 2 VIEWS RIGHT Result Date: 06/05/2024 CLINICAL DATA:  Pain, fracture. EXAM: RIGHT FEMUR 2 VIEWS COMPARISON:  Preoperative imaging FINDINGS: Ten fluoroscopic spot views of the right proximal femur submitted from the operating room. Femoral  intramedullary nail with trans trochanteric and distal locking screw fixation of proximal femur fracture. Fluoroscopy time 3 minutes 54 seconds. Dose 72.01 mGy. IMPRESSION: Intraoperative fluoroscopy during ORIF of proximal femur fracture. Electronically Signed   By: Andrea Gasman M.D.   On: 06/05/2024 12:01   DG HIP UNILAT WITH PELVIS 2-3 VIEWS RIGHT Result Date: 06/05/2024 CLINICAL DATA:  Hip fracture. EXAM: DG HIP (WITH OR WITHOUT PELVIS) 2-3V*R* COMPARISON:  Preoperative imaging FINDINGS: Femoral intramedullary nail with trans trochanteric and distal locking screw fixation of proximal femur fracture. Persistent displacement of the lesser trochanteric fracture fragment. Recent postsurgical change includes air and edema in the soft tissues. Overlying skin staples in place. The bones are subjectively under mineralized. IMPRESSION: ORIF of proximal femur fracture. Electronically Signed   By: Andrea Gasman M.D.   On: 06/05/2024 12:01   DG C-Arm  1-60 Min-No Report Result Date: 06/05/2024 Fluoroscopy was utilized by the requesting physician.  No radiographic interpretation.   DG CHEST PORT 1 VIEW Result Date: 06/03/2024 CLINICAL DATA:  PICC placement. EXAM: PORTABLE CHEST 1 VIEW COMPARISON:  Chest radiograph dated 06/01/2024 FINDINGS: Right-sided PICC with tip over central SVC. No focal consolidation, pleural effusion or pneumothorax. Stable cardiomegaly. Atherosclerotic calcification of the aorta. No acute osseous pathology. Left axillary surgical clips. IMPRESSION: Right-sided PICC with tip over central SVC. Electronically Signed   By: Vanetta Chou M.D.   On: 06/03/2024 18:07   CT HIP RIGHT WO CONTRAST Result Date: 06/03/2024 EXAM: CT OF THE RIGHT HIP WITHOUT IV CONTRAST 06/03/2024 02:59:00 PM TECHNIQUE: CT of the right hip was performed without the administration of intravenous contrast. Multiplanar reformatted images are provided for review. Automated exposure control, iterative  reconstruction, and/or weight based adjustment of the mA/kV was utilized to reduce the radiation dose to as low as reasonably achievable. COMPARISON: Radiographs 06/01/2024. CLINICAL HISTORY: Hip trauma, fracture suspected, xray done; worsening swelling of right thigh, dropping Hb, suspect hematoma/hemorrhage. FINDINGS: BONES: Comminuted right hip fracture includes a basocervical femoral neck component as well as an intertrochanteric fracture component extending from the anterior aspect of the greater trochanter through the lesser trochanter. Varus and apex anterior angulation observed between the proximal and intermediary fragments with the degree of varus angulation and tilt of the proximal fragment resulting in some impingement between the lateral acetabulum and the femoral neck with indistinctness of the femoral cortex at the junction of the femoral head and neck in this region, for example on image 74 series 5, favoring additional lateral subcapital fracturing. Mild spurring of the right femoral head. Spurring at the pubis also noted. No aggressive appearing osseous abnormality or periostitis. SOFT TISSUE: Edema and tracts along the iliacus muscle with some expansion of the iliacus muscle compared to the prior CT pelvis from 12/14/2019 suspicious for intermuscular hematoma although not considered large enough in volume to substantially drop the hemoglobin level. Lateral subcutaneous edema along the hip likely indicates bruising. Small amount of fluid in the right trochanteric bursa is complex and may reflect hematoma but again is not of a volume to substantially drop the hemoglobin level. Chronic atrophy of the right gluteus medius and right gluteus minimus muscles. Low level edema tracks along the right hip adductor musculature. Small right groin hernia contains adipose tissue. Clips are noted just above this groin hernia. No soft tissue mass. JOINT: The comminuted right hip fracture involves significant  disruption of the joint, including a basocervical femoral neck component and an intertrochanteric fracture component. Varus and apex anterior angulation between the proximal and intermediary fragments results in impingement between the lateral acetabulum and the femoral neck, with indistinctness of the femoral cortex at the junction of the femoral head and neck in this region, favoring additional lateral subcapital fracturing. Mild spurring of the right femoral head is present. INTRAPELVIC CONTENTS: Foley catheter in the otherwise empty urinary bladder. Iliac and right common femoral artery atheromatous vascular calcification. Trace free pelvic fluid, etiology uncertain. IMPRESSION: 1. Comminuted right hip fracture with basocervical femoral neck and intertrochanteric components, with varus and apex anterior angulation, and possible additional lateral subcapital fracture component due to cortical indistinctness at the lateral femoral headneck junction. 2. Edema tracks along the iliacus muscle which is mildly expanded, suspicious for intermuscular hematoma, but not considered large enough to substantially drop hemoglobin level. 3. Small amount of complex fluid in the right trochanteric bursa, possibly hematoma, not of  a volume to substantially drop hemoglobin level. 4. Degenerative changes with mild spurring of the right femoral head. 5. Degenerative changes with spurring at the pubis. Electronically signed by: Ryan Salvage MD 06/03/2024 04:13 PM EST RP Workstation: [id number]   US  EKG SITE RITE Result Date: 06/03/2024 If Site Rite image not attached, placement could not be confirmed due to current cardiac rhythm.  ECHOCARDIOGRAM COMPLETE Result Date: 06/02/2024    ECHOCARDIOGRAM REPORT   Patient Name:   Pam [name] Date of Exam: 06/02/2024 Medical Rec #:  [id number]       Height:       63.0 in Accession #:    [id number]      Weight:       135.0 lb Date of Birth:  09-28-1940      BSA:          1.636  m Patient Age:    82 years        BP:           100/54 mmHg Patient Gender: F               HR:           78 bpm. Exam Location:  [facility] Procedure: 2D Echo, 3D Echo, Cardiac Doppler, Color Doppler and Strain Analysis            (Both Spectral and Color Flow Doppler were utilized during            procedure). Indications:    CHF  History:        Patient has prior history of Echocardiogram examinations, most                 recent 01/21/2023. Cardiomyopathy, Arrythmias:Atrial Fibrillation,                 Signs/Symptoms:Shortness of Breath; Risk Factors:Dyslipidemia.  Sonographer:    Philomena Daring Referring Phys: [id number] QJMYJW RASHID  Sonographer Comments: Global longitudinal strain was attempted. IMPRESSIONS  1. Left ventricular ejection fraction, by estimation, is 60 to 65%. Left ventricular ejection fraction by 3D volume is 55 %. The left ventricle has normal function. The left ventricle has no regional wall motion abnormalities. Left ventricular diastolic  function could not be evaluated. The average left ventricular global longitudinal strain is -12.4 %.  2. Right ventricular systolic function is normal. The right ventricular size is normal. There is normal pulmonary artery systolic pressure.  3. Left atrial size was mildly dilated.  4. The mitral valve is normal in structure. Trivial mitral valve regurgitation. No evidence of mitral stenosis.  5. The aortic valve is tricuspid. Aortic valve regurgitation is trivial. No aortic stenosis is present.  6. The inferior vena cava is normal in size with greater than 50% respiratory variability, suggesting right atrial pressure of 3 mmHg.  7. Evidence of atrial level shunting detected by color flow Doppler. There is a patent foramen ovale with bidirectional shunting across atrial septum. Comparison(s): No significant change from prior study. FINDINGS  Left Ventricle: Left ventricular ejection fraction, by estimation, is 60 to 65%. Left ventricular ejection fraction by  3D volume is 55 %. The left ventricle has normal function. The left ventricle has no regional wall motion abnormalities. The average left ventricular global longitudinal strain is -12.4 %. Strain was performed and the global longitudinal strain is indeterminate. The left ventricular internal cavity size was normal in size. There is no left ventricular hypertrophy. Left ventricular diastolic function could not be evaluated  due to atrial fibrillation. Left ventricular diastolic function could not be evaluated. Right Ventricle: The right ventricular size is normal. No increase in right ventricular wall thickness. Right ventricular systolic function is normal. There is normal pulmonary artery systolic pressure. The tricuspid regurgitant velocity is 2.71 m/s, and  with an assumed right atrial pressure of 3 mmHg, the estimated right ventricular systolic pressure is 32.4 mmHg. Left Atrium: Left atrial size was mildly dilated. Right Atrium: Right atrial size was normal in size. Pericardium: There is no evidence of pericardial effusion. Mitral Valve: The mitral valve is normal in structure. Trivial mitral valve regurgitation. No evidence of mitral valve stenosis. Tricuspid Valve: The tricuspid valve is normal in structure. Tricuspid valve regurgitation is mild . No evidence of tricuspid stenosis. Aortic Valve: The aortic valve is tricuspid. Aortic valve regurgitation is trivial. No aortic stenosis is present. Pulmonic Valve: The pulmonic valve was normal in structure. Pulmonic valve regurgitation is trivial. No evidence of pulmonic stenosis. Aorta: The aortic root and ascending aorta are structurally normal, with no evidence of dilitation. Venous: The inferior vena cava is normal in size with greater than 50% respiratory variability, suggesting right atrial pressure of 3 mmHg. IAS/Shunts: Evidence of atrial level shunting detected by color flow Doppler. Additional Comments: 3D was performed not requiring image post  processing on an independent workstation and was normal.  LEFT VENTRICLE PLAX 2D LVIDd:         3.50 cm         Diastology LVIDs:         2.60 cm         LV e' medial:    9.14 cm/s LV PW:         0.90 cm         LV E/e' medial:  11.8 LV IVS:        0.90 cm         LV e' lateral:   12.50 cm/s LVOT diam:     2.00 cm         LV E/e' lateral: 8.6 LV SV:         49 LV SV Index:   30              2D Longitudinal LVOT Area:     3.14 cm        Strain                                2D Strain GLS   -12.4 %                                Avg:                                 3D Volume EF                                LV 3D EF:    Left                                             ventricul  ar                                             ejection                                             fraction                                             by 3D                                             volume is                                             55 %.                                 3D Volume EF:                                3D EF:        55 %                                LV EDV:       73 ml                                LV ESV:       33 ml                                LV SV:        40 ml RIGHT VENTRICLE             IVC RV Basal diam:  3.60 cm     IVC diam: 1.70 cm RV Mid diam:    2.90 cm RV S prime:     10.10 cm/s TAPSE (M-mode): 1.7 cm LEFT ATRIUM             Index        RIGHT ATRIUM           Index LA diam:        3.40 cm 2.08 cm/m   RA Area:     17.60 cm LA Vol (A2C):   69.9 ml 42.72 ml/m  RA Volume:   43.20 ml  26.40 ml/m LA Vol (A4C):   49.0 ml 29.95 ml/m LA Biplane Vol: 58.4 ml 35.69 ml/m  AORTIC VALVE LVOT Vmax:   96.10 cm/s LVOT Vmean:  62.400 cm/s LVOT VTI:    0.157 m  AORTA Ao Root diam: 2.80  cm Ao Asc diam:  3.00 cm MITRAL VALVE                TRICUSPID VALVE MV Area (PHT): 4.17 cm     TR Peak grad:   29.4 mmHg MV Decel Time: 182 msec     TR Vmax:        271.00  cm/s MV E velocity: 108.00 cm/s                             SHUNTS                             Systemic VTI:  0.16 m                             Systemic Diam: 2.00 cm Vishnu Priya Mallipeddi Electronically signed by Diannah Late Mallipeddi Signature Date/Time: 06/02/2024/3:08:10 PM    Final    CT Head Wo Contrast Result Date: 06/01/2024 EXAM: CT HEAD WITHOUT CONTRAST 06/01/2024 09:00:33 PM TECHNIQUE: CT of the head was performed without the administration of intravenous contrast. Automated exposure control, iterative reconstruction, and/or weight based adjustment of the mA/kV was utilized to reduce the radiation dose to as low as reasonably achievable. COMPARISON: None available. CLINICAL HISTORY: Head trauma, moderate-severe. FINDINGS: BRAIN AND VENTRICLES: No acute hemorrhage. No evidence of acute infarct. No hydrocephalus. No extra-axial collection. No mass effect or midline shift. Mild periventricular white matter disease. Age-related volume loss. Mild vascular calcifications. ORBITS: No acute abnormality. Status post cataract surgery. SINUSES: No acute abnormality. SOFT TISSUES AND SKULL: No acute soft tissue abnormality. No skull fracture. IMPRESSION: 1. No acute intracranial abnormality. Electronically signed by: Pinkie Pebbles MD 06/01/2024 09:03 PM EST RP Workstation: [id number]   DG Chest 1 View Result Date: 06/01/2024 EXAM: 1 VIEW(S) XRAY OF THE CHEST 06/01/2024 07:15:00 PM COMPARISON: 04/11/2024 CLINICAL HISTORY: chest pain FINDINGS: LUNGS AND PLEURA: Low lung volumes with bronchovascular crowding. No pleural effusion. No pneumothorax. HEART AND MEDIASTINUM: Aortic tortuosity with atherosclerosis. No cardiomegaly. BONES AND SOFT TISSUES: Surgical clips left axilla. No acute osseous abnormality. JOINTS: Bilateral shoulder osteoarthritis. DISCS/DEGENERATIVE CHANGES: Multilevel degenerative disc disease of the spine. IMPRESSION: 1. Lung volumes. No acute cardiopulmonary abnormality.  Electronically signed by: Rogelia Myers MD 06/01/2024 07:25 PM EST RP Workstation: [id number]   DG Hip Unilat With Pelvis 2-3 Views Right Result Date: 06/01/2024 EXAM: 2 OR MORE VIEW(S) XRAY OF THE RIGHT HIP 06/01/2024 07:13:00 PM COMPARISON: None available. CLINICAL HISTORY: fall FINDINGS: BONES AND JOINTS: Acute impacted intertrochanteric fracture of the right femur with varus angulation. Mild bilateral hip degenerative changes. SOFT TISSUES: The soft tissues are unremarkable. IMPRESSION: 1. Acute impacted intertrochanteric fracture of the right femur. Electronically signed by: Pinkie Pebbles MD 06/01/2024 07:18 PM EST RP Workstation: [id number]    Microbiology: Results for orders placed or performed during the hospital encounter of 06/01/24  Surgical pcr screen     Status: None   Collection Time: 06/03/24  5:00 AM   Specimen: Nasal Mucosa; Nasal Swab  Result Value Ref Range Status   MRSA, PCR NEGATIVE NEGATIVE Final   Staphylococcus aureus NEGATIVE NEGATIVE Final    Comment: (NOTE) The Xpert SA Assay (FDA approved for NASAL specimens in patients 43 years of age and older), is one component of a comprehensive surveillance program. It is not intended to diagnose infection nor to guide or  monitor treatment. Performed at [facility], [address] [place], [place] [address]     Labs: CBC: Recent Labs  Lab 06/05/24 0308 06/06/24 0328 06/07/24 0332 06/08/24 0447 06/09/24 0438  WBC 12.2* 8.3 8.0 7.9 7.6  HGB 11.6* 9.7* 8.7* 8.2* 8.3*  HCT 34.6* 29.5* 25.5* 24.4* 25.3*  MCV 90.1 92.5 92.4 92.1 92.7  PLT 220 196 173 206 278   Basic Metabolic Panel: Recent Labs  Lab 06/03/24 1232 06/04/24 0428 06/05/24 0308 06/06/24 0328 06/07/24 0332 06/08/24 0447 06/08/24 1509 06/09/24 0438  NA 132*   < > 133* 133* 127* 127* 129* 135  K 4.4   < > 4.3 4.3 4.4 4.5 4.3 3.9  CL 101   < > 99 100 94* 94* 94* 97*  CO2 22   < > 25 26 25 27 28 31   GLUCOSE 94   < > 139* 116* 99 111* 157*  98  BUN 26*   < > 15 15 26* 53* 63* 47*  CREATININE 1.41*   < > 0.96 0.97 1.54* 1.26* 1.14* 1.07*  CALCIUM 7.5*   < > 8.0* 7.5* 7.5* 7.5* 7.7* 8.1*  MG 2.4  --   --   --   --  2.6*  --  2.4  PHOS  --   --  1.7* 3.3  --   --   --   --    < > = values in this interval not displayed.   Liver Function Tests: Recent Labs  Lab 06/05/24 0308 06/08/24 0447  AST 15 32  ALT 10 13  ALKPHOS 64 87  BILITOT 0.7 0.8  PROT 5.4* 4.6*  ALBUMIN 3.4* 2.7*   CBG: No results for input(s): GLUCAP in the last 168 hours.  Discharge time spent: 31 minutes.  Length of inpatient stay: 8 days  Signed: Carliss LELON Canales, DO [facility] 06/09/2024

## 2024-06-09 NOTE — Plan of Care (Signed)

## 2024-06-09 NOTE — TOC Transition Note (Signed)
 Transition of Care [facility]) - Discharge Note   Patient Details  Name: Pam [name] MRN: [id number] Date of Birth: 20-Oct-1940  Transition of [facility]) CM/SW Contact:  Pam Saddie Kim, LCSW Phone Number: 06/09/2024, 10:57 AM   Clinical Narrative: Pt d/c today to [facility]. Pt and facility aware and agreeable. Pt reports she has notified her family. SNF authorization received. D/C summary sent to SNF. RN given number to call report. Will transport via [employer].       Final next level of care: Skilled Nursing Facility Barriers to Discharge: Barriers Resolved   Patient Goals and CMS Choice Patient states their goals for this hospitalization and ongoing recovery are:: agreeable to SNF CMS Medicare.gov Compare Post Acute Care list provided to:: Patient Choice offered to / list presented to : Patient [place] ownership interest in [facility].provided to:: Patient    Discharge Placement              Patient chooses bed at: Other - please specify in the comment section below: [facility]) Patient to be transferred to facility by: [facility] Name of family member notified: pt notified family Patient and family notified of of transfer: 06/09/24  Discharge Plan and Services Additional resources added to the After Visit Summary for       Post Acute Care Choice: Durable Medical Equipment                               Social Drivers of Health (SDOH) Interventions SDOH Screenings   Food Insecurity: No Food Insecurity (06/02/2024)  Housing: Unknown (06/02/2024)  Transportation Needs: No Transportation Needs (06/02/2024)  Utilities: Not At Risk (06/02/2024)  Social Connections: Socially Isolated (06/02/2024)  Tobacco Use: Medium Risk (06/05/2024)     Readmission Risk Interventions    06/03/2024   11:25 AM 06/02/2024   11:38 AM  Readmission Risk Prevention Plan  Transportation Screening Complete Complete  HRI or Home Care Consult  Complete Complete  Social Work Consult for Recovery Care Planning/Counseling Complete Complete  Palliative Care Screening Not Applicable Not Applicable  Medication Review Oceanographer) Complete Complete

## 2024-06-10 ENCOUNTER — Telehealth: Payer: Self-pay | Admitting: Orthopedic Surgery

## 2024-06-10 NOTE — Telephone Encounter (Signed)
 Ok I have it ready to fax will send op note What s the fax number

## 2024-06-10 NOTE — Telephone Encounter (Signed)
[phone] 

## 2024-06-10 NOTE — Telephone Encounter (Signed)
 Dr. Areatha pt - spoke w/Amy at [facility], she is requesting an order for the facility to remove the staples postop 12-14 days.  Op note says:  Staples if present can be removed postop day 12-14

## 2024-06-11 ENCOUNTER — Encounter (HOSPITAL_COMMUNITY): Payer: Self-pay | Admitting: Orthopedic Surgery

## 2024-06-15 NOTE — Telephone Encounter (Signed)
 Dr. Areatha pt - Pam [name] w/[facility] [phone] lvm today at 3:48pm stating this was an urgent message, stated the pt had surgery on her rt hip 06/05/24.  She stated the pt has a non-removable dressing.  She stated the D/C summary didn't include any orders.  She stated her next appointment is not for 4 weeks.  She would like a call back and would like to know what we want them to do w/the non-removable dressing at this time and how long do we want it to stay on.  OP notes says:   Staples if present can be removed postop day 12-14 Anticoagulation for 28 days resume Eliquis  in 24 hours Follow-up visit at 4 weeks for x-rays and then x-rays at 8 weeks and 12 weeks

## 2024-06-16 NOTE — Telephone Encounter (Signed)
 I called sw Erica and stated everything has been taken care of, didn't know when she called.

## 2024-06-25 ENCOUNTER — Ambulatory Visit: Admitting: Cardiology

## 2024-06-29 ENCOUNTER — Other Ambulatory Visit: Payer: Self-pay | Admitting: Cardiology

## 2024-06-30 ENCOUNTER — Telehealth: Payer: Self-pay | Admitting: Orthopedic Surgery

## 2024-06-30 NOTE — Telephone Encounter (Signed)
 Received call from patients daughter, Sari. She stated [employer] has not received her FMLA forms that I faxed 06/24/24. I advised patient that I re-faxed and also, will email her a copy [email or web address]. ph [phone]

## 2024-07-03 ENCOUNTER — Ambulatory Visit: Admitting: Orthopedic Surgery

## 2024-07-03 ENCOUNTER — Other Ambulatory Visit

## 2024-07-03 ENCOUNTER — Encounter: Admitting: Orthopedic Surgery

## 2024-07-03 DIAGNOSIS — S72001D Fracture of unspecified part of neck of right femur, subsequent encounter for closed fracture with routine healing: Secondary | ICD-10-CM

## 2024-07-03 NOTE — Progress Notes (Signed)
° ° ° °  07/03/2024   Chief Complaint  Patient presents with   Post-op Follow-up    Right hip     Encounter Diagnosis  Name Primary?   Closed fracture of right hip with routine healing, subsequent encounter Yes    What pharmacy do you use ? __[facility]_________  DOI/DOS/ Date: 06/05/24  Pam [name] is 83 years old she is a academic librarian  We are 28 days since her surgery and she has finished her physical therapy at home and she is on a walker anxious to get to a cane  Her x-ray looks good No results found.   Clinical exam shows some stiffness in the right knee but Pam [name] thinks that it has returned to its preinjury state.  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  Both of them only bend about 95 degrees  Her hip flexion is 110 degrees no pain  Recommend  Physical therapy Home visit 1-2 times to train her how to use a cane  X-ray in 6 weeks

## 2024-07-03 NOTE — Progress Notes (Signed)
° ° °  07/03/2024   Chief Complaint  Patient presents with   Post-op Follow-up    Right hip     Encounter Diagnosis  Name Primary?   Closed fracture of right hip with routine healing, subsequent encounter Yes    What pharmacy do you use ? [facility]_________  DOI/DOS/ Date: 05/1424  Did you get better, worse or no change (Answer below)   Improved

## 2024-08-05 ENCOUNTER — Ambulatory Visit: Admitting: Cardiology

## 2024-08-09 ENCOUNTER — Other Ambulatory Visit: Payer: Self-pay | Admitting: Cardiology

## 2024-08-13 ENCOUNTER — Ambulatory Visit: Admitting: Orthopedic Surgery

## 2024-08-13 ENCOUNTER — Encounter: Payer: Self-pay | Admitting: Orthopedic Surgery

## 2024-08-13 ENCOUNTER — Other Ambulatory Visit: Payer: Self-pay

## 2024-08-13 VITALS — Ht 63.0 in | Wt 149.0 lb

## 2024-08-13 DIAGNOSIS — S72001D Fracture of unspecified part of neck of right femur, subsequent encounter for closed fracture with routine healing: Secondary | ICD-10-CM

## 2024-08-13 DIAGNOSIS — M21371 Foot drop, right foot: Secondary | ICD-10-CM

## 2024-08-13 NOTE — Progress Notes (Addendum)
" ° ° °  08/14/2024   Chief Complaint  Patient presents with   Post-op Follow-up    Right hip    Difficulty Walking    Foot turns out and will not lift for ambulation (foot drop)    Encounter Diagnoses  Name Primary?   Closed fracture of right hip with routine healing, subsequent encounter IM nail short 06/05/24 Yes   Foot drop, right     What pharmacy do you use ? ____[facility]_______  DOI/DOS/ Date: 06/05/24  Did you get better, worse or no change (Answer below)   Improved Walking with walker Has new onset of right foot drop since physical therapy on Tuesday      "

## 2024-08-13 NOTE — Progress Notes (Signed)
"  ° °  Patient ID: Pam [name], female   DOB: 11-23-1940, 84 y.o.   MRN: [id number]    POST OP VISIT  POD # 2   Patient: Pam [name]           Date of Birth: 1940/12/14           MRN: [id number] Visit Date: 08/13/2024 Requested by: Orpha Yancey LABOR, MD [address] [place],  [place] [address] PCP: Orpha Yancey LABOR, MD  Chief Complaint  Patient presents with   Post-op Follow-up    Right hip     Encounter Diagnoses  Name Primary?   Closed fracture of right hip with routine healing, subsequent encounter IM nail short 06/05/24 Yes   Foot drop, right     PROCEDURE: Cephalic medullary nailing right hip  SUBJECTIVE:   The patient has been progressing well doing well with no complaints of hip pain.  However on Tuesday 2 days ago the patient noticed that her right foot developed weakness and she developed a foot drop  She denies any numbness tingling back pain or leg pain  EXAM FINDINGS:   INCISION: no sign of infection  She has weakness in dorsiflexion plantarflexion and peroneal muscle function   Assessment and plan:  Unclear etiology of foot drop.  Recommend AFO.  Reexamine on next visit.  Patient does not appear to have any back related symptoms that would explain her foot drop  in 4 week(s)   "

## 2024-08-14 ENCOUNTER — Encounter: Payer: Self-pay | Admitting: Orthopedic Surgery

## 2024-08-14 ENCOUNTER — Other Ambulatory Visit: Payer: Self-pay | Admitting: Cardiology

## 2024-08-19 ENCOUNTER — Telehealth: Payer: Self-pay | Admitting: Orthopedic Surgery

## 2024-08-19 DIAGNOSIS — M21371 Foot drop, right foot: Secondary | ICD-10-CM

## 2024-08-19 NOTE — Telephone Encounter (Signed)
 I will call her to see if she has talked to the rep

## 2024-08-19 NOTE — Telephone Encounter (Signed)
 Spoke w/the pt, she stated no one has called her about her brace.

## 2024-08-19 NOTE — Telephone Encounter (Signed)
 I sent orders to a  rep I called patient to see if he has called her yet. No answer no voice mail

## 2024-08-19 NOTE — Telephone Encounter (Signed)
 Dr. Areatha pt - spoke w/the pt, she wants to know if her brace has come in.  [phone]

## 2024-08-19 NOTE — Telephone Encounter (Signed)
 I sent message to him asking him to contact patient with status of getting her fitted.

## 2024-08-19 NOTE — Telephone Encounter (Signed)
 I will message rep to check status

## 2024-08-19 NOTE — Telephone Encounter (Signed)
 Hey Rylen Swindler,  I have actually tried that number and another one on her demographics sheet but didn't get an answer or call back. I will call her today and schedule her scan. Good news- I did run her benefits and the brace is covered at about 85-90%.   I will keep you updated on her scan!  Thanks,  Lanae Angers  I called her and she had acutally just gotten off phone

## 2024-08-20 NOTE — Addendum Note (Signed)
 Addended byBETHA JENEAN GREIG LELON on: 08/20/2024 04:42 PM   Modules accepted: Orders

## 2024-08-20 NOTE — Telephone Encounter (Addendum)
 Lanae went to her home to get her fit for the AFO brace and he sent email stating Hi Anish Vana,  I just met with Cathlean at her home to take the scan for her AFO. Unfortunately, we dont think she would be a good candidate for this brace. I took several scans and worked with our advertising account planner, but ultimately, they dont think this brace would be a good option for her. She is unable to achieve a neutral position of the ankle and knee while keeping her foot flat. This would cause more issues than it would help.  Im sorry this brace didnt work out for her! It is a great brace for patients who can achieve that neutral positioning. If you have any more questions, please let me know. Im also more than happy to stop by and show you the brace in person or do an in-service for any providers.   The next step is [facility] I guess for an off the shelf AFO maybe   Let me know and I will call her

## 2024-08-20 NOTE — Telephone Encounter (Signed)
 I discussed with Dr Margrette he wants to refer her to the foot and ankle specialist to see what he recommends for her.  I will call her and put in the referral to our [place] office for Dr Harden

## 2024-08-20 NOTE — Telephone Encounter (Signed)
 Discussed with patient, put in referral

## 2024-08-31 ENCOUNTER — Encounter: Admitting: Orthopedic Surgery

## 2024-09-10 ENCOUNTER — Encounter: Admitting: Orthopedic Surgery
